# Patient Record
Sex: Female | Born: 1935
Health system: Southern US, Community
[De-identification: ages and names within clinical notes are randomized; demographics above are authoritative.]

## PROBLEM LIST (undated history)

## (undated) DIAGNOSIS — E079 Disorder of thyroid, unspecified: Secondary | ICD-10-CM

## (undated) DIAGNOSIS — M2041 Other hammer toe(s) (acquired), right foot: Secondary | ICD-10-CM

## (undated) DIAGNOSIS — J449 Chronic obstructive pulmonary disease, unspecified: Secondary | ICD-10-CM

## (undated) DIAGNOSIS — J45909 Unspecified asthma, uncomplicated: Secondary | ICD-10-CM

## (undated) DIAGNOSIS — E119 Type 2 diabetes mellitus without complications: Secondary | ICD-10-CM

## (undated) DIAGNOSIS — M1990 Unspecified osteoarthritis, unspecified site: Secondary | ICD-10-CM

## (undated) DIAGNOSIS — L570 Actinic keratosis: Secondary | ICD-10-CM

## (undated) HISTORY — DX: Disorder of thyroid, unspecified: E07.9

## (undated) HISTORY — DX: Unspecified asthma, uncomplicated: J45.909

## (undated) HISTORY — DX: Type 2 diabetes mellitus without complications: E11.9

## (undated) HISTORY — DX: Chronic obstructive pulmonary disease, unspecified: J44.9

## (undated) HISTORY — DX: Unspecified osteoarthritis, unspecified site: M19.90

## (undated) HISTORY — PX: CATARACT EXTRACTION, BILATERAL: SHX1313

## (undated) HISTORY — DX: Actinic keratosis: L57.0

---
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
## 1982-11-14 HISTORY — PX: ABDOMINAL HYSTERECTOMY: SHX81

## 1985-11-14 HISTORY — PX: CHOLECYSTECTOMY: SHX55

## 1988-11-14 HISTORY — PX: NASAL SINUS SURGERY: SHX719

## 2005-11-14 HISTORY — PX: REPLACEMENT TOTAL KNEE: SUR1224

## 2013-10-13 DIAGNOSIS — E119 Type 2 diabetes mellitus without complications: Secondary | ICD-10-CM | POA: Diagnosis not present

## 2013-10-13 DIAGNOSIS — M2550 Pain in unspecified joint: Secondary | ICD-10-CM | POA: Diagnosis not present

## 2013-10-13 DIAGNOSIS — Z9981 Dependence on supplemental oxygen: Secondary | ICD-10-CM | POA: Diagnosis not present

## 2013-10-13 DIAGNOSIS — IMO0002 Reserved for concepts with insufficient information to code with codable children: Secondary | ICD-10-CM | POA: Diagnosis not present

## 2013-10-13 DIAGNOSIS — Z9181 History of falling: Secondary | ICD-10-CM | POA: Diagnosis not present

## 2013-10-13 DIAGNOSIS — J449 Chronic obstructive pulmonary disease, unspecified: Secondary | ICD-10-CM | POA: Diagnosis not present

## 2013-10-15 DIAGNOSIS — E119 Type 2 diabetes mellitus without complications: Secondary | ICD-10-CM | POA: Diagnosis not present

## 2013-10-15 DIAGNOSIS — Z9981 Dependence on supplemental oxygen: Secondary | ICD-10-CM | POA: Diagnosis not present

## 2013-10-15 DIAGNOSIS — M2550 Pain in unspecified joint: Secondary | ICD-10-CM | POA: Diagnosis not present

## 2013-10-15 DIAGNOSIS — J449 Chronic obstructive pulmonary disease, unspecified: Secondary | ICD-10-CM | POA: Diagnosis not present

## 2013-10-15 DIAGNOSIS — IMO0002 Reserved for concepts with insufficient information to code with codable children: Secondary | ICD-10-CM | POA: Diagnosis not present

## 2013-10-15 DIAGNOSIS — Z9181 History of falling: Secondary | ICD-10-CM | POA: Diagnosis not present

## 2013-10-18 DIAGNOSIS — J449 Chronic obstructive pulmonary disease, unspecified: Secondary | ICD-10-CM | POA: Diagnosis not present

## 2013-10-18 DIAGNOSIS — M2550 Pain in unspecified joint: Secondary | ICD-10-CM | POA: Diagnosis not present

## 2013-10-18 DIAGNOSIS — Z9981 Dependence on supplemental oxygen: Secondary | ICD-10-CM | POA: Diagnosis not present

## 2013-10-18 DIAGNOSIS — E119 Type 2 diabetes mellitus without complications: Secondary | ICD-10-CM | POA: Diagnosis not present

## 2013-10-18 DIAGNOSIS — Z9181 History of falling: Secondary | ICD-10-CM | POA: Diagnosis not present

## 2013-10-18 DIAGNOSIS — IMO0002 Reserved for concepts with insufficient information to code with codable children: Secondary | ICD-10-CM | POA: Diagnosis not present

## 2013-10-24 DIAGNOSIS — M2550 Pain in unspecified joint: Secondary | ICD-10-CM | POA: Diagnosis not present

## 2013-10-24 DIAGNOSIS — IMO0002 Reserved for concepts with insufficient information to code with codable children: Secondary | ICD-10-CM | POA: Diagnosis not present

## 2013-10-24 DIAGNOSIS — Z9981 Dependence on supplemental oxygen: Secondary | ICD-10-CM | POA: Diagnosis not present

## 2013-10-24 DIAGNOSIS — Z9181 History of falling: Secondary | ICD-10-CM | POA: Diagnosis not present

## 2013-10-24 DIAGNOSIS — E119 Type 2 diabetes mellitus without complications: Secondary | ICD-10-CM | POA: Diagnosis not present

## 2013-10-24 DIAGNOSIS — J449 Chronic obstructive pulmonary disease, unspecified: Secondary | ICD-10-CM | POA: Diagnosis not present

## 2013-11-01 DIAGNOSIS — E119 Type 2 diabetes mellitus without complications: Secondary | ICD-10-CM | POA: Diagnosis not present

## 2013-11-01 DIAGNOSIS — Z9181 History of falling: Secondary | ICD-10-CM | POA: Diagnosis not present

## 2013-11-01 DIAGNOSIS — IMO0002 Reserved for concepts with insufficient information to code with codable children: Secondary | ICD-10-CM | POA: Diagnosis not present

## 2013-11-01 DIAGNOSIS — M2550 Pain in unspecified joint: Secondary | ICD-10-CM | POA: Diagnosis not present

## 2013-11-01 DIAGNOSIS — Z9981 Dependence on supplemental oxygen: Secondary | ICD-10-CM | POA: Diagnosis not present

## 2013-11-01 DIAGNOSIS — J449 Chronic obstructive pulmonary disease, unspecified: Secondary | ICD-10-CM | POA: Diagnosis not present

## 2013-11-04 DIAGNOSIS — E119 Type 2 diabetes mellitus without complications: Secondary | ICD-10-CM | POA: Diagnosis not present

## 2013-11-04 DIAGNOSIS — M2550 Pain in unspecified joint: Secondary | ICD-10-CM | POA: Diagnosis not present

## 2013-11-04 DIAGNOSIS — Z9181 History of falling: Secondary | ICD-10-CM | POA: Diagnosis not present

## 2013-11-04 DIAGNOSIS — J449 Chronic obstructive pulmonary disease, unspecified: Secondary | ICD-10-CM | POA: Diagnosis not present

## 2013-11-04 DIAGNOSIS — IMO0002 Reserved for concepts with insufficient information to code with codable children: Secondary | ICD-10-CM | POA: Diagnosis not present

## 2013-11-04 DIAGNOSIS — Z9981 Dependence on supplemental oxygen: Secondary | ICD-10-CM | POA: Diagnosis not present

## 2013-11-12 DIAGNOSIS — E119 Type 2 diabetes mellitus without complications: Secondary | ICD-10-CM | POA: Diagnosis not present

## 2013-11-12 DIAGNOSIS — Z9181 History of falling: Secondary | ICD-10-CM | POA: Diagnosis not present

## 2013-11-12 DIAGNOSIS — IMO0002 Reserved for concepts with insufficient information to code with codable children: Secondary | ICD-10-CM | POA: Diagnosis not present

## 2013-11-12 DIAGNOSIS — J449 Chronic obstructive pulmonary disease, unspecified: Secondary | ICD-10-CM | POA: Diagnosis not present

## 2013-11-12 DIAGNOSIS — M2550 Pain in unspecified joint: Secondary | ICD-10-CM | POA: Diagnosis not present

## 2013-11-12 DIAGNOSIS — Z9981 Dependence on supplemental oxygen: Secondary | ICD-10-CM | POA: Diagnosis not present

## 2013-11-18 DIAGNOSIS — M2550 Pain in unspecified joint: Secondary | ICD-10-CM | POA: Diagnosis not present

## 2013-11-18 DIAGNOSIS — Z9981 Dependence on supplemental oxygen: Secondary | ICD-10-CM | POA: Diagnosis not present

## 2013-11-18 DIAGNOSIS — J449 Chronic obstructive pulmonary disease, unspecified: Secondary | ICD-10-CM | POA: Diagnosis not present

## 2013-11-18 DIAGNOSIS — IMO0002 Reserved for concepts with insufficient information to code with codable children: Secondary | ICD-10-CM | POA: Diagnosis not present

## 2013-11-18 DIAGNOSIS — Z9181 History of falling: Secondary | ICD-10-CM | POA: Diagnosis not present

## 2013-11-18 DIAGNOSIS — E119 Type 2 diabetes mellitus without complications: Secondary | ICD-10-CM | POA: Diagnosis not present

## 2013-11-27 DIAGNOSIS — E119 Type 2 diabetes mellitus without complications: Secondary | ICD-10-CM | POA: Diagnosis not present

## 2013-11-27 DIAGNOSIS — Z9981 Dependence on supplemental oxygen: Secondary | ICD-10-CM | POA: Diagnosis not present

## 2013-11-27 DIAGNOSIS — IMO0002 Reserved for concepts with insufficient information to code with codable children: Secondary | ICD-10-CM | POA: Diagnosis not present

## 2013-11-27 DIAGNOSIS — Z9181 History of falling: Secondary | ICD-10-CM | POA: Diagnosis not present

## 2013-11-27 DIAGNOSIS — J449 Chronic obstructive pulmonary disease, unspecified: Secondary | ICD-10-CM | POA: Diagnosis not present

## 2013-11-27 DIAGNOSIS — M2550 Pain in unspecified joint: Secondary | ICD-10-CM | POA: Diagnosis not present

## 2013-12-05 DIAGNOSIS — J449 Chronic obstructive pulmonary disease, unspecified: Secondary | ICD-10-CM | POA: Diagnosis not present

## 2013-12-05 DIAGNOSIS — Z9181 History of falling: Secondary | ICD-10-CM | POA: Diagnosis not present

## 2013-12-05 DIAGNOSIS — M2550 Pain in unspecified joint: Secondary | ICD-10-CM | POA: Diagnosis not present

## 2013-12-05 DIAGNOSIS — Z9981 Dependence on supplemental oxygen: Secondary | ICD-10-CM | POA: Diagnosis not present

## 2013-12-05 DIAGNOSIS — IMO0002 Reserved for concepts with insufficient information to code with codable children: Secondary | ICD-10-CM | POA: Diagnosis not present

## 2013-12-05 DIAGNOSIS — E119 Type 2 diabetes mellitus without complications: Secondary | ICD-10-CM | POA: Diagnosis not present

## 2013-12-09 DIAGNOSIS — J449 Chronic obstructive pulmonary disease, unspecified: Secondary | ICD-10-CM | POA: Diagnosis not present

## 2013-12-09 DIAGNOSIS — E119 Type 2 diabetes mellitus without complications: Secondary | ICD-10-CM | POA: Diagnosis not present

## 2013-12-09 DIAGNOSIS — IMO0002 Reserved for concepts with insufficient information to code with codable children: Secondary | ICD-10-CM | POA: Diagnosis not present

## 2013-12-09 DIAGNOSIS — M2550 Pain in unspecified joint: Secondary | ICD-10-CM | POA: Diagnosis not present

## 2013-12-09 DIAGNOSIS — Z9181 History of falling: Secondary | ICD-10-CM | POA: Diagnosis not present

## 2013-12-09 DIAGNOSIS — Z9981 Dependence on supplemental oxygen: Secondary | ICD-10-CM | POA: Diagnosis not present

## 2013-12-19 DIAGNOSIS — E119 Type 2 diabetes mellitus without complications: Secondary | ICD-10-CM | POA: Diagnosis not present

## 2013-12-19 DIAGNOSIS — I10 Essential (primary) hypertension: Secondary | ICD-10-CM | POA: Diagnosis not present

## 2013-12-19 DIAGNOSIS — E039 Hypothyroidism, unspecified: Secondary | ICD-10-CM | POA: Diagnosis not present

## 2013-12-19 DIAGNOSIS — M810 Age-related osteoporosis without current pathological fracture: Secondary | ICD-10-CM | POA: Diagnosis not present

## 2013-12-24 DIAGNOSIS — E039 Hypothyroidism, unspecified: Secondary | ICD-10-CM | POA: Diagnosis not present

## 2013-12-24 DIAGNOSIS — J449 Chronic obstructive pulmonary disease, unspecified: Secondary | ICD-10-CM | POA: Diagnosis not present

## 2013-12-24 DIAGNOSIS — N390 Urinary tract infection, site not specified: Secondary | ICD-10-CM | POA: Diagnosis not present

## 2013-12-24 DIAGNOSIS — E119 Type 2 diabetes mellitus without complications: Secondary | ICD-10-CM | POA: Diagnosis not present

## 2013-12-24 DIAGNOSIS — I10 Essential (primary) hypertension: Secondary | ICD-10-CM | POA: Diagnosis not present

## 2014-02-13 DIAGNOSIS — N390 Urinary tract infection, site not specified: Secondary | ICD-10-CM | POA: Diagnosis not present

## 2014-03-13 DIAGNOSIS — R062 Wheezing: Secondary | ICD-10-CM | POA: Diagnosis not present

## 2014-03-13 DIAGNOSIS — R0602 Shortness of breath: Secondary | ICD-10-CM | POA: Diagnosis not present

## 2014-03-13 DIAGNOSIS — R0609 Other forms of dyspnea: Secondary | ICD-10-CM | POA: Diagnosis not present

## 2014-03-13 DIAGNOSIS — R0682 Tachypnea, not elsewhere classified: Secondary | ICD-10-CM | POA: Diagnosis not present

## 2014-04-16 DIAGNOSIS — R0609 Other forms of dyspnea: Secondary | ICD-10-CM | POA: Diagnosis not present

## 2014-04-16 DIAGNOSIS — R0602 Shortness of breath: Secondary | ICD-10-CM | POA: Diagnosis not present

## 2014-04-16 DIAGNOSIS — J019 Acute sinusitis, unspecified: Secondary | ICD-10-CM | POA: Diagnosis not present

## 2014-04-16 DIAGNOSIS — J449 Chronic obstructive pulmonary disease, unspecified: Secondary | ICD-10-CM | POA: Diagnosis not present

## 2014-04-16 DIAGNOSIS — R0989 Other specified symptoms and signs involving the circulatory and respiratory systems: Secondary | ICD-10-CM | POA: Diagnosis not present

## 2014-05-12 DIAGNOSIS — R062 Wheezing: Secondary | ICD-10-CM | POA: Diagnosis not present

## 2014-05-12 DIAGNOSIS — R05 Cough: Secondary | ICD-10-CM | POA: Diagnosis not present

## 2014-05-12 DIAGNOSIS — R059 Cough, unspecified: Secondary | ICD-10-CM | POA: Diagnosis not present

## 2014-05-12 DIAGNOSIS — J449 Chronic obstructive pulmonary disease, unspecified: Secondary | ICD-10-CM | POA: Diagnosis not present

## 2014-05-12 DIAGNOSIS — J019 Acute sinusitis, unspecified: Secondary | ICD-10-CM | POA: Diagnosis not present

## 2014-05-19 DIAGNOSIS — R0609 Other forms of dyspnea: Secondary | ICD-10-CM | POA: Diagnosis not present

## 2014-05-19 DIAGNOSIS — R0682 Tachypnea, not elsewhere classified: Secondary | ICD-10-CM | POA: Diagnosis not present

## 2014-05-19 DIAGNOSIS — R062 Wheezing: Secondary | ICD-10-CM | POA: Diagnosis not present

## 2014-05-19 DIAGNOSIS — R0602 Shortness of breath: Secondary | ICD-10-CM | POA: Diagnosis not present

## 2014-05-20 DIAGNOSIS — I10 Essential (primary) hypertension: Secondary | ICD-10-CM | POA: Diagnosis not present

## 2014-05-20 DIAGNOSIS — IMO0001 Reserved for inherently not codable concepts without codable children: Secondary | ICD-10-CM | POA: Diagnosis not present

## 2014-05-20 DIAGNOSIS — E785 Hyperlipidemia, unspecified: Secondary | ICD-10-CM | POA: Diagnosis not present

## 2014-05-21 DIAGNOSIS — Z9119 Patient's noncompliance with other medical treatment and regimen: Secondary | ICD-10-CM | POA: Diagnosis not present

## 2014-05-21 DIAGNOSIS — Z91199 Patient's noncompliance with other medical treatment and regimen due to unspecified reason: Secondary | ICD-10-CM | POA: Diagnosis not present

## 2014-05-21 DIAGNOSIS — E785 Hyperlipidemia, unspecified: Secondary | ICD-10-CM | POA: Diagnosis not present

## 2014-05-21 DIAGNOSIS — IMO0001 Reserved for inherently not codable concepts without codable children: Secondary | ICD-10-CM | POA: Diagnosis not present

## 2014-05-30 DIAGNOSIS — J449 Chronic obstructive pulmonary disease, unspecified: Secondary | ICD-10-CM | POA: Diagnosis not present

## 2014-05-30 DIAGNOSIS — I10 Essential (primary) hypertension: Secondary | ICD-10-CM | POA: Diagnosis not present

## 2014-05-30 DIAGNOSIS — IMO0001 Reserved for inherently not codable concepts without codable children: Secondary | ICD-10-CM | POA: Diagnosis not present

## 2014-06-06 DIAGNOSIS — IMO0001 Reserved for inherently not codable concepts without codable children: Secondary | ICD-10-CM | POA: Diagnosis not present

## 2014-07-28 ENCOUNTER — Ambulatory Visit (INDEPENDENT_AMBULATORY_CARE_PROVIDER_SITE_OTHER): Payer: Medicare Other

## 2014-07-28 ENCOUNTER — Encounter: Payer: Self-pay | Admitting: Podiatry

## 2014-07-28 ENCOUNTER — Ambulatory Visit (INDEPENDENT_AMBULATORY_CARE_PROVIDER_SITE_OTHER): Payer: Medicare Other | Admitting: Podiatry

## 2014-07-28 VITALS — BP 140/81 | HR 70 | Resp 16 | Ht 64.0 in | Wt 190.0 lb

## 2014-07-28 DIAGNOSIS — S92309A Fracture of unspecified metatarsal bone(s), unspecified foot, initial encounter for closed fracture: Secondary | ICD-10-CM

## 2014-07-28 DIAGNOSIS — E119 Type 2 diabetes mellitus without complications: Secondary | ICD-10-CM | POA: Diagnosis not present

## 2014-07-28 NOTE — Progress Notes (Signed)
   Subjective:    Patient ID: Megan Bradshaw, female    DOB: [birth year], 80 y.o.   MRN: [id number]  HPI Comments: Its the left foot. Its painful. Its not hurting as much as it was. In the mornings are really bad. It hurts to walk and stand. The foot has been hurting for 1 week. i take ibuprofen and wear a darco shoe.   Foot Pain      Review of Systems  HENT: Positive for sinus pressure.   Respiratory: Positive for shortness of breath.   Hematological: Bruises/bleeds easily.  All other systems reviewed and are negative.      Objective:   Physical Exam: I have reviewed her past medical history medications allergies surgeries social history and review of systems. Pulses are strongly palpable neurologic sensorium is intact percent monofilament deep tendon reflexes are intact bilateral muscle strength is 5 over 5 dorsiflexors plantar flexors inverters everters all intrinsic musculature is intact. Orthopedic evaluation demonstrates hallux abductovalgus deformities bilateral and hammertoe deformities bilateral. She has pain on palpation fifth metatarsal base of the left foot with overlying erythema and edema and pain. Radiographic evaluation confirms a stress fracture fifth metatarsal base left foot.        Assessment & Plan:  Assessment: Fifth metatarsal fracture nondisplaced non-comminuted appears to be a stress fracture base of the fifth metatarsal.  Plan: Discussed etiology pathology conservative versus surgical therapies. Placed her in a Darco shoe and will followup with her in one month for set of x-rays if necessary.

## 2014-08-04 DIAGNOSIS — J441 Chronic obstructive pulmonary disease with (acute) exacerbation: Secondary | ICD-10-CM | POA: Diagnosis not present

## 2014-08-12 ENCOUNTER — Encounter: Payer: Self-pay | Admitting: Internal Medicine

## 2014-08-12 ENCOUNTER — Ambulatory Visit (INDEPENDENT_AMBULATORY_CARE_PROVIDER_SITE_OTHER)
Admission: RE | Admit: 2014-08-12 | Discharge: 2014-08-12 | Disposition: A | Discharge status: Home / Self Care | Payer: Medicare Other | Source: Ambulatory Visit | Attending: Internal Medicine | Admitting: Internal Medicine

## 2014-08-12 ENCOUNTER — Ambulatory Visit (INDEPENDENT_AMBULATORY_CARE_PROVIDER_SITE_OTHER): Payer: Medicare Other | Admitting: Internal Medicine

## 2014-08-12 VITALS — BP 140/90 | HR 79 | Temp 97.9°F | Ht 64.0 in | Wt 192.0 lb

## 2014-08-12 DIAGNOSIS — G4734 Idiopathic sleep related nonobstructive alveolar hypoventilation: Secondary | ICD-10-CM | POA: Diagnosis not present

## 2014-08-12 DIAGNOSIS — J449 Chronic obstructive pulmonary disease, unspecified: Secondary | ICD-10-CM

## 2014-08-12 DIAGNOSIS — E669 Obesity, unspecified: Secondary | ICD-10-CM

## 2014-08-12 DIAGNOSIS — J9819 Other pulmonary collapse: Secondary | ICD-10-CM | POA: Diagnosis not present

## 2014-08-12 MED ORDER — FLUTICASONE FUROATE-VILANTEROL 100-25 MCG/INH IN AEPB: 1.0000 | INHALATION_SPRAY | Freq: Every morning | RESPIRATORY_TRACT | Status: DC

## 2014-08-12 NOTE — Progress Notes (Signed)
Quick Note:  Spoke with pt and notified of results per Dr. Wert. Pt verbalized understanding and denied any questions.  ______ 

## 2014-08-12 NOTE — Assessment & Plan Note (Signed)
Note significant wt gain since quit smoking and c/o sob bending over one of her main complaints, reviewed.   Already did rehab in [place] otherwise would be good candidate  See instructions for specific recommendations which were reviewed directly with the patient who was given a copy with highlighter outlining the key components.

## 2014-08-12 NOTE — Progress Notes (Signed)
   Subjective:    Patient ID: Megan Bradshaw, female    DOB: 11-21-1929  MRN: [id number]  HPI   35 yowf quit smoking 1989 at wt around 135 with onset of sob around 2000 and dx as copd by pulmonary doctor but no need for any maint rx short prednisone course until 2013 required 5 mg floor and arrived in [facility] 08/12/2014    08/12/2014 1st [place] [facility] office visit/ Graysin Luczynski   Chief Complaint  Patient presents with  . Pulmonary Consult    Self referral. Pt states dxed with COPD approx 15 yrs ago. She c/o DOE with bending over and "walking too much".      doe x sev hundred feet maybe once or twice a day albuterol plus alb neb each in addition to advair Worse since flare of cough rx  augmentin   No obvious day to day or daytime variabilty or assoc chronic cough or cp or chest tightness, subjective wheeze overt sinus or hb symptoms. No unusual exp hx or h/o childhood pna/ asthma or knowledge of premature birth.  Sleeping ok without nocturnal  or early am exacerbation  of respiratory  c/o's or need for noct saba. Also denies any obvious fluctuation of symptoms with weather or environmental changes or other aggravating or alleviating factors except as outlined above   Current Medications, Allergies, Complete Past Medical History, Past Surgical History, Family History, and Social History were reviewed in [employer] record.   .        Review of Systems  Constitutional: Negative for fever, chills and unexpected weight change.  HENT: Positive for congestion. Negative for dental problem, ear pain, nosebleeds, postnasal drip, rhinorrhea, sinus pressure, sneezing, sore throat, trouble swallowing and voice change.   Eyes: Negative for visual disturbance.  Respiratory: Positive for shortness of breath. Negative for cough and choking.   Cardiovascular: Negative for chest pain and leg swelling.  Gastrointestinal: Negative for vomiting, abdominal pain and diarrhea.    Genitourinary: Negative for difficulty urinating.  Musculoskeletal: Positive for arthralgias.  Skin: Negative for rash.  Neurological: Negative for tremors, syncope and headaches.  Hematological: Does not bruise/bleed easily.       Objective:   Physical Exam  amb obese wf nad  Wt Readings from Last 3 Encounters:  07/28/14 190 lb (86.183 kg)        HEENT mild turbinate edema. Top set dentures. Oropharynx no thrush or excess pnd or cobblestoning.  No JVD or cervical adenopathy. Mild accessory muscle hypertrophy. Trachea midline, nl thryroid. Chest was hyperinflated by percussion with diminished breath sounds and moderate increased exp time without wheeze. Hoover sign positive at mid inspiration. Regular rate and rhythm without murmur gallop or rub or increase P2 or edema.  Abd: no hsm, nl excursion. Ext warm without cyanosis or clubbing.       CXR  08/12/2014 :  1. Bibasilar and bilateral upper lobe subsegmental atelectasis and/or scarring. 2. No acute cardiopulmonary disease.      Assessment & Plan:

## 2014-08-12 NOTE — Assessment & Plan Note (Signed)
-   08/12/2014  Walked RA @ mod to fast pace x 3 laps @ 185 ft each stopped due to  Sob, no desat  - ONO requested but no need for 02 at rest or with exertion

## 2014-08-12 NOTE — Patient Instructions (Signed)
Plan A = Automatic = Breo 2puffs off of one click each am   Plan B = backup = Only use your albuterol as a rescue medication to be used if you can't catch your breath by resting or doing a relaxed purse lip breathing pattern.  - The less you use it, the better it will work when you need it. - Ok to use up to 2 puffs  every 4 hours if you must but call for immediate appointment if use goes up over your usual need - Don't leave home without it !!  (think of it like the spare tire for your car)   Plan C = Nebulized albuterol up to every 4 hours  Plan D = doctor call if A thru C not working  Plan E = ER, go there if all else fails  Prednisone should taper off if you can   Please remember to go to the x-ray department downstairs for your tests - we will call you with the results when they are available.

## 2014-08-12 NOTE — Assessment & Plan Note (Addendum)
DDX of  difficult airways management all start with A and  include Adherence, Ace Inhibitors, Acid Reflux, Active Sinus Disease, Alpha 1 Antitripsin deficiency, Anxiety masquerading as Airways dz,  ABPA,  allergy(esp in young), Aspiration (esp in elderly), Adverse effects of DPI,  Active smokers, plus two Bs  = Bronchiectasis and Beta blocker use..and one C= CHF  Adherence is always the initial "prime suspect" and is a multilayered concern that requires a "trust but verify" approach in every patient - starting with knowing how to use medications, especially inhalers, correctly, keeping up with refills and understanding the fundamental difference between maintenance and prns vs those medications only taken for a very short course and then stopped and not refilled.  The proper method of use, as well as anticipated side effects, of a metered-dose inhaler are discussed and demonstrated to the patient. Improved effectiveness after extensive coaching during this visit to a level of approximately  25% from a baseline of < 25%  ? Allergic component (thus steroid dep) > doubt, try to taper off systemic steroids    So try Breo each am as easiest to use pending f/u pfts

## 2014-08-13 ENCOUNTER — Encounter: Payer: Self-pay | Admitting: Internal Medicine

## 2014-08-18 DIAGNOSIS — R0902 Hypoxemia: Secondary | ICD-10-CM | POA: Diagnosis not present

## 2014-08-20 ENCOUNTER — Encounter: Payer: Self-pay | Admitting: Internal Medicine

## 2014-08-20 ENCOUNTER — Telehealth: Payer: Self-pay | Admitting: Internal Medicine

## 2014-08-20 NOTE — Telephone Encounter (Signed)
Per MW- ONO on RA was normal, no need for noct o2 LMTCB

## 2014-08-20 NOTE — Telephone Encounter (Signed)
Spoke with pt and notified of results per Dr. Wert. Pt verbalized understanding and denied any questions. 

## 2014-08-25 ENCOUNTER — Ambulatory Visit: Payer: Medicare Other | Admitting: Podiatry

## 2014-09-03 ENCOUNTER — Ambulatory Visit: Payer: Medicare Other | Admitting: Podiatry

## 2014-09-22 ENCOUNTER — Encounter: Payer: Self-pay | Admitting: Internal Medicine

## 2014-09-22 ENCOUNTER — Ambulatory Visit (INDEPENDENT_AMBULATORY_CARE_PROVIDER_SITE_OTHER): Payer: Medicare Other | Admitting: Internal Medicine

## 2014-09-22 VITALS — BP 138/78 | HR 80 | Temp 97.8°F | Ht 62.33 in | Wt 190.2 lb

## 2014-09-22 DIAGNOSIS — N390 Urinary tract infection, site not specified: Secondary | ICD-10-CM

## 2014-09-22 DIAGNOSIS — E039 Hypothyroidism, unspecified: Secondary | ICD-10-CM | POA: Diagnosis not present

## 2014-09-22 DIAGNOSIS — Z13220 Encounter for screening for lipoid disorders: Secondary | ICD-10-CM

## 2014-09-22 DIAGNOSIS — T380X5A Adverse effect of glucocorticoids and synthetic analogues, initial encounter: Secondary | ICD-10-CM | POA: Diagnosis not present

## 2014-09-22 DIAGNOSIS — E669 Obesity, unspecified: Secondary | ICD-10-CM | POA: Diagnosis not present

## 2014-09-22 DIAGNOSIS — J449 Chronic obstructive pulmonary disease, unspecified: Secondary | ICD-10-CM

## 2014-09-22 DIAGNOSIS — R350 Frequency of micturition: Secondary | ICD-10-CM | POA: Diagnosis not present

## 2014-09-22 DIAGNOSIS — E099 Drug or chemical induced diabetes mellitus without complications: Secondary | ICD-10-CM

## 2014-09-22 LAB — COMPREHENSIVE METABOLIC PANEL
ALT: 18 U/L (ref 0–35)
AST: 25 U/L (ref 0–37)
Albumin: 3.5 g/dL (ref 3.5–5.2)
Alkaline Phosphatase: 65 U/L (ref 39–117)
BUN: 25 mg/dL — ABNORMAL HIGH (ref 6–23)
CO2: 23 meq/L (ref 19–32)
Calcium: 9.4 mg/dL (ref 8.4–10.5)
Chloride: 103 mEq/L (ref 96–112)
Creatinine, Ser: 0.8 mg/dL (ref 0.4–1.2)
GFR: 76.24 mL/min (ref >60.00)
GLUCOSE: 124 mg/dL — AB (ref 70–99)
POTASSIUM: 4.3 meq/L (ref 3.5–5.1)
SODIUM: 140 meq/L (ref 135–145)
TOTAL PROTEIN: 7.5 g/dL (ref 6.0–8.3)
Total Bilirubin: 0.4 mg/dL (ref 0.2–1.2)

## 2014-09-22 LAB — CBC
HCT: 43.3 % (ref 36.0–46.0)
Hemoglobin: 14.1 g/dL (ref 12.0–15.0)
MCHC: 32.5 g/dL (ref 30.0–36.0)
MCV: 91 fl (ref 78.0–100.0)
Platelets: 310 10*3/uL (ref 150.0–400.0)
RBC: 4.76 Mil/uL (ref 3.87–5.11)
RDW: 13.8 % (ref 11.5–15.5)
WBC: 8.9 10*3/uL (ref 4.0–10.5)

## 2014-09-22 LAB — LIPID PANEL
Cholesterol: 162 mg/dL (ref 0–200)
HDL: 42.7 mg/dL (ref >39.00)
LDL Cholesterol: 95 mg/dL (ref 0–99)
NonHDL: 119.3
Total CHOL/HDL Ratio: 4
Triglycerides: 124 mg/dL (ref 0.0–149.0)
VLDL: 24.8 mg/dL (ref 0.0–40.0)

## 2014-09-22 LAB — POCT URINALYSIS DIPSTICK
Blood, UA: NEGATIVE
GLUCOSE UA: NEGATIVE
KETONES UA: NEGATIVE
Nitrite, UA: POSITIVE
Spec Grav, UA: >=1.03
UROBILINOGEN UA: NEGATIVE
pH, UA: 5.5

## 2014-09-22 LAB — TSH: TSH: 2.32 u[IU]/mL (ref 0.35–4.50)

## 2014-09-22 LAB — HEMOGLOBIN A1C: Hgb A1c MFr Bld: 8.4 % — ABNORMAL HIGH (ref 4.6–6.5)

## 2014-09-22 MED ORDER — CIPROFLOXACIN HCL 250 MG PO TABS: 250.0000 mg | ORAL_TABLET | Freq: Two times a day (BID) | ORAL | Status: DC

## 2014-09-22 NOTE — Assessment & Plan Note (Signed)
On prednisone and Breo Breathing is okay today Continue to follow with Dr. Melvyn Novas

## 2014-09-22 NOTE — Progress Notes (Signed)
Pre visit review using our clinic review tool, if applicable. No additional management support is needed unless otherwise documented below in the visit note. 

## 2014-09-22 NOTE — Patient Instructions (Signed)
Diabetes and Standards of Medical Care Diabetes is complicated. You may find that your diabetes team includes a dietitian, nurse, diabetes educator, eye doctor, and more. To help everyone know what is going on and to help you get the care you deserve, the following schedule of care was developed to help keep you on track. Below are the tests, exams, vaccines, medicines, education, and plans you will need. HbA1c test This test shows how well you have controlled your glucose over the past 2-3 months. It is used to see if your diabetes management plan needs to be adjusted.   It is performed at least 2 times a year if you are meeting treatment goals.  It is performed 4 times a year if therapy has changed or if you are not meeting treatment goals. Blood pressure test  This test is performed at every routine medical visit. The goal is less than 140/90 mm Hg for most people, but 130/80 mm Hg in some cases. Ask your health care provider about your goal. Dental exam  Follow up with the dentist regularly. Eye exam  If you are diagnosed with type 1 diabetes as a child, get an exam upon reaching the age of 37 years or older and have had diabetes for 3-5 years. Yearly eye exams are recommended after that initial eye exam.  If you are diagnosed with type 1 diabetes as an adult, get an exam within 5 years of diagnosis and then yearly.  If you are diagnosed with type 2 diabetes, get an exam as soon as possible after the diagnosis and then yearly. Foot care exam  Visual foot exams are performed at every routine medical visit. The exams check for cuts, injuries, or other problems with the feet.  A comprehensive foot exam should be done yearly. This includes visual inspection as well as assessing foot pulses and testing for loss of sensation.  Check your feet nightly for cuts, injuries, or other problems with your feet. Tell your health care provider if anything is not healing. Kidney function test (urine  microalbumin)  This test is performed once a year.  Type 1 diabetes: The first test is performed 5 years after diagnosis.  Type 2 diabetes: The first test is performed at the time of diagnosis.  A serum creatinine and estimated glomerular filtration rate (eGFR) test is done once a year to assess the level of chronic kidney disease (CKD), if present. Lipid profile (cholesterol, HDL, LDL, triglycerides)  Performed every 5 years for most people.  The goal for LDL is less than 100 mg/dL. If you are at high risk, the goal is less than 70 mg/dL.  The goal for HDL is 40 mg/dL-50 mg/dL for men and 50 mg/dL-60 mg/dL for women. An HDL cholesterol of 60 mg/dL or higher gives some protection against heart disease.  The goal for triglycerides is less than 150 mg/dL. Influenza vaccine, pneumococcal vaccine, and hepatitis B vaccine  The influenza vaccine is recommended yearly.  It is recommended that people with diabetes who are over 24 years old get the pneumonia vaccine. In some cases, two separate shots may be given. Ask your health care provider if your pneumonia vaccination is up to date.  The hepatitis B vaccine is also recommended for adults with diabetes. Diabetes self-management education  Education is recommended at diagnosis and ongoing as needed. Treatment plan  Your treatment plan is reviewed at every medical visit. Document Released: 08/28/2009 Document Revised: 03/17/2014 Document Reviewed: 04/02/2013 [facility] Patient Information 2015 [place],  [employer]. This information is not intended to replace advice given to you by your health care provider. Make sure you discuss any questions you have with your health care provider.

## 2014-09-22 NOTE — Assessment & Plan Note (Signed)
Exercise impaired by COPD She does reports that she walks 4 x week on the treadmill

## 2014-09-22 NOTE — Progress Notes (Signed)
HPI  Pt presents to the clinic today to establish care. She recently moved from [place] to be closer to her daughter.  Flu: 2015 Tetanus: unsure Pneumovax: unsure Zostovax: never Pap Smear: unsure Mammogram: 2014 Bone Density: unsure Colon Screening: 2013 Vision Screening: unsure Dentist: as needed  DM 2: She reports this is steroid induced from being on prednisone for so long for her COPD. She is on glipizide and metformin. She does not have an eye exam yearly. She does take her flu shot, but has already had this year. She has had a pneumonia shot but does not remember the year.  COPD: Former smoker. On prednisone and Breo. Albuterol prn. Follows with Dr. Melvyn Novas.   Hypothyroidism: Has not noticed any symptoms of hypothyroidism. Satisfied on current dose of synthroid. Has not had her levels checked in about 4 months.  Past Medical History  Diagnosis Date  . Diabetes   . Asthma   . Thyroid disease   . COPD (chronic obstructive pulmonary disease)   . Arthritis     Current Outpatient Prescriptions  Medication Sig Dispense Refill  . albuterol (VENTOLIN HFA) 108 (90 BASE) MCG/ACT inhaler Inhale 1-2 puffs into the lungs every 6 (six) hours as needed for wheezing or shortness of breath.    . cetirizine (ZYRTEC) 10 MG tablet Take 10 mg by mouth daily.    . Cholecalciferol (VITAMIN D PO) Take 1 tablet by mouth daily.    . Fluticasone Furoate-Vilanterol (BREO ELLIPTA) 100-25 MCG/INH AEPB Inhale 1 puff into the lungs every morning.    Marland Kitchen glipiZIDE (GLUCOTROL XL) 2.5 MG 24 hr tablet Take 2.5 mg by mouth daily with breakfast.    . levothyroxine (SYNTHROID, LEVOTHROID) 75 MCG tablet Take 75 mcg by mouth daily before breakfast.    . metFORMIN (GLUCOPHAGE) 500 MG tablet Take 500 mg by mouth 2 (two) times daily with a meal.    . Multiple Vitamin (MULTIVITAMIN) capsule Take 1 capsule by mouth daily.    . predniSONE (DELTASONE) 5 MG tablet Take 1.25 mg by mouth daily with breakfast.      No current  facility-administered medications for this visit.    No Known Allergies  Family History  Problem Relation Age of Onset  . Arthritis Father   . Diabetes Sister   . Diabetes Daughter   . Cancer Neg Hx   . Heart disease Neg Hx   . Stroke Neg Hx     History   Social History  . Marital Status: Unknown    Spouse Name: N/A    Number of Children: N/A  . Years of Education: N/A   Occupational History  . Retired    Social History Main Topics  . Smoking status: Former Smoker -- 1.50 packs/day for 30 years    Types: Cigarettes    Quit date: 11/15/1987  . Smokeless tobacco: Never Used  . Alcohol Use: No  . Drug Use: No  . Sexual Activity: Not on file   Other Topics Concern  . Not on file   Social History Narrative    ROS:  Constitutional: Denies fever, malaise, fatigue, headache or abrupt weight changes.  HEENT: Pt reports runny nose. Denies eye pain, eye redness, ear pain, ringing in the ears, wax buildup, nasal congestion, bloody nose, or sore throat. Respiratory: Denies difficulty breathing, shortness of breath, cough or sputum production.   Cardiovascular: Denies chest pain, chest tightness, palpitations or swelling in the hands or feet.  GU: Pt reports urgency and frequency. Denies pain  with urination, blood in urine, odor or discharge. Skin: Denies redness, rashes, lesions or ulcercations.  Neurological: Denies dizziness, difficulty with memory, difficulty with speech or problems with balance and coordination.   No other specific complaints in a complete review of systems (except as listed in HPI above).  PE:  BP 138/78 mmHg  Pulse 80  Temp(Src) 97.8 F (36.6 C) (Oral)  Ht 5' 2.33" (1.583 m)  Wt 190 lb 4 oz (86.297 kg)  BMI 34.44 kg/m2  SpO2 98% Wt Readings from Last 3 Encounters:  09/22/14 190 lb 4 oz (86.297 kg)  08/13/14 192 lb (87.091 kg)  07/28/14 190 lb (86.183 kg)    General: Appears her stated age, obese but well developed, well nourished in  NAD. Cardiovascular: Normal rate and rhythm. S1,S2 noted.  No murmur, rubs or gallops noted.  Pulmonary/Chest: Normal effort and positive vesicular breath sounds. No respiratory distress. No wheezes, rales or ronchi noted.  Abdomen: Soft and tender over the bladder. Normal bowel sounds, no bruits noted. No distention or masses noted. Liver, spleen and kidneys non palpable. No CVA tenderness. Neurological: Alert and oriented. Cranial nerves II-XII grossly intact.   BMET    Component Value Date/Time   NA 140 09/22/2014 1034   K 4.3 09/22/2014 1034   CL 103 09/22/2014 1034   CO2 23 09/22/2014 1034   GLUCOSE 124* 09/22/2014 1034   BUN 25* 09/22/2014 1034   CREATININE 0.8 09/22/2014 1034   CALCIUM 9.4 09/22/2014 1034    Lipid Panel     Component Value Date/Time   CHOL 162 09/22/2014 1034   TRIG 124.0 09/22/2014 1034   HDL 42.70 09/22/2014 1034   CHOLHDL 4 09/22/2014 1034   VLDL 24.8 09/22/2014 1034   LDLCALC 95 09/22/2014 1034    CBC    Component Value Date/Time   WBC 8.9 09/22/2014 1034   RBC 4.76 09/22/2014 1034   HGB 14.1 09/22/2014 1034   HCT 43.3 09/22/2014 1034   PLT 310.0 09/22/2014 1034   MCV 91.0 09/22/2014 1034   MCHC 32.5 09/22/2014 1034   RDW 13.8 09/22/2014 1034    Hgb A1C Lab Results  Component Value Date   HGBA1C 8.4* 09/22/2014     Assessment and Plan:  Urgency and Frequency secondary to UTI:  Urinalysis: 1+ leuks, pos nitrites, trace blood Will send urine culture Will send RX for cipro 250 mg BID x 5 days Push fluids  RTC in 6 months or sooner if needed

## 2014-09-22 NOTE — Assessment & Plan Note (Signed)
Will check A1C today Will get records from previous provider to check immunization status/health maintenance status Foot exam today Continue Metformin and Glipizide daily Encouraged her to consume a low carb diet

## 2014-09-22 NOTE — Assessment & Plan Note (Signed)
Will repeat TSH and free T4 today Will adjust dose of synthroid if needed

## 2014-09-23 ENCOUNTER — Ambulatory Visit (INDEPENDENT_AMBULATORY_CARE_PROVIDER_SITE_OTHER): Payer: Medicare Other | Admitting: Internal Medicine

## 2014-09-23 ENCOUNTER — Encounter: Payer: Self-pay | Admitting: Internal Medicine

## 2014-09-23 DIAGNOSIS — J449 Chronic obstructive pulmonary disease, unspecified: Secondary | ICD-10-CM | POA: Diagnosis not present

## 2014-09-23 NOTE — Progress Notes (Signed)
Subjective:    Patient ID: Megan Bradshaw, female    DOB: December 12, 1929  MRN: [id number]     Brief patient profile:  84 yowf quit smoking 1989 at wt around 135 with onset of sob around 2000 and dx as copd by pulmonary doctor but no need for any maint rx short prednisone course until 2013 required 5 mg floor and arrived in [facility] 08/12/2014    History of Present Illness  08/12/2014 1st [place] [facility] office visit/ Lahna Nath   Chief Complaint  Patient presents with  . Pulmonary Consult    Self referral. Pt states dxed with COPD approx 15 yrs ago. She c/o DOE with bending over and "walking too much".      doe x sev hundred feet maybe once or twice a day albuterol plus alb neb each in addition to advair Worse since flare of cough rx  augmentin  rec Plan A = Automatic = Breo 2puffs off of one click each am  Plan B = backup = Only use your albuterol as a rescue medication to be used if you can't catch your breath by resting or doing a relaxed purse lip breathing pattern.  - The less you use it, the better it will work when you need it. - Ok to use up to 2 puffs  every 4 hours if you must but call for immediate appointment if use goes up over your usual need - Don't leave home without it !!  (think of it like the spare tire for your car)  Plan C = Nebulized albuterol up to every 4 hours Plan D = doctor call if A thru C not working Plan E = ER, go there if all else fails Prednisone should taper off if you can      09/23/2014 f/u ov/Tniyah Nakagawa re: copd ? Steroid dep at one quarter pred 5  Daily (1.25 mg daily ) Chief Complaint  Patient presents with  . Follow-up    Pt states that her breathing has improved since the last visit. No new co's today.  Breo costing 194 dollars a month  Doing 30 min on" treadmill with arms set on #2"  s stopping  No need for saba rescue   No obvious day to day or daytime variabilty or assoc chronic cough or cp or chest tightness, subjective wheeze overt sinus or  hb symptoms. No unusual exp hx or h/o childhood pna/ asthma or knowledge of premature birth.  Sleeping ok without nocturnal  or early am exacerbation  of respiratory  c/o's or need for noct saba. Also denies any obvious fluctuation of symptoms with weather or environmental changes or other aggravating or alleviating factors except as outlined above   Current Medications, Allergies, Complete Past Medical History, Past Surgical History, Family History, and Social History were reviewed in [employer] record.  ROS  The following are not active complaints unless bolded sore throat, dysphagia, dental problems, itching, sneezing,  nasal congestion or excess/ purulent secretions, ear ache,   fever, chills, sweats, unintended wt loss, pleuritic or exertional cp, hemoptysis,  orthopnea pnd or leg swelling, presyncope, palpitations, heartburn, abdominal pain, anorexia, nausea, vomiting, diarrhea  or change in bowel or urinary habits, change in stools or urine, dysuria,hematuria,  rash, arthralgias, visual complaints, headache, numbness weakness or ataxia or problems with walking or coordination,  change in mood/affect or memory.                     Objective:  Physical Exam  amb mod obese wf nad  Wt Readings from Last 3 Encounters:  09/23/14 195 lb (88.451 kg)  09/22/14 190 lb 4 oz (86.297 kg)  08/13/14 192 lb (87.091 kg)    Vital signs reviewed    HEENT mild turbinate edema. Top set dentures. Oropharynx no thrush or excess pnd or cobblestoning.  No JVD or cervical adenopathy. Mild accessory muscle hypertrophy. Trachea midline, nl thryroid. Chest was hyperinflated by percussion with diminished breath sounds and moderate increased exp time without wheeze. Hoover sign positive at mid inspiration. Regular rate and rhythm without murmur gallop or rub or increase P2 or edema.  Abd: no hsm, nl excursion. Ext warm without cyanosis or clubbing.       CXR  08/12/2014 :  1.  Bibasilar and bilateral upper lobe subsegmental atelectasis and/or scarring. 2. No acute cardiopulmonary disease.      Assessment & Plan:

## 2014-09-23 NOTE — Patient Instructions (Addendum)
Continue your Breo for now  We need for you to supply me in person with a copy of your drug formulary from your insurance re your copd to pick alternatives to [facility]   Continue the exercise as much you can for 30 min daily if possible  Please schedule a follow up office visit in 4 weeks, sooner if needed with pfts  Late add:  Take 1.25 qod x 2 weeks and stop

## 2014-09-24 ENCOUNTER — Telehealth: Payer: Self-pay | Admitting: *Deleted

## 2014-09-24 NOTE — Telephone Encounter (Signed)
LMTCB for the pt 

## 2014-09-24 NOTE — Telephone Encounter (Signed)
[email or web address].Colton Tassin, CMA  

## 2014-09-24 NOTE — Telephone Encounter (Signed)
ATC, Mailbox is full, [facility]

## 2014-09-24 NOTE — Telephone Encounter (Signed)
Spoke with patient-she states she understands what MW wants her to do however she cant just stop Prednisone-other MD's have told her not to. Will forward to MW to address. Thanks.

## 2014-09-24 NOTE — Telephone Encounter (Signed)
She indicated she was using a quarter of a  5 mg which means her body must be making it's own but in any case that's enough to worry about either way and fine with me if she stays on it or even tries qod dosing but from a pulmonary perspective does not need daily steroids

## 2014-09-24 NOTE — Assessment & Plan Note (Signed)
-   08/12/2014 p extensive coaching HFA effectiveness =    25% - Trial of BREO  08/12/2014 > marked improvement 09/23/14 >   I had an extended discussion with the patient today lasting 15 to 20 minutes of a 25 minute visit on the following issues:   1)  She has had such a great response ok to taper pred off  2) needs to understand insurance formularies/ alternatives as we don't have enough samples of breo and she can't afford the copay  3) no justification at all to continue 02 either at hs or noct   F/u with pfts needed next

## 2014-09-24 NOTE — Telephone Encounter (Signed)
Pt returning call.Megan Bradshaw ° °

## 2014-09-24 NOTE — Telephone Encounter (Signed)
-----   Message from Tanda Rockers, MD sent at 09/24/2014  6:28 AM EST ----- Late add: Pred Take 1.25 qod x 2 weeks and stop

## 2014-09-25 LAB — URINE CULTURE

## 2014-09-25 NOTE — Telephone Encounter (Signed)
Spoke with pt and advised of Dr Gustavus Bryant recommendations.  Pt verbalized understanding.  Nothing further needed.

## 2014-09-26 ENCOUNTER — Other Ambulatory Visit: Payer: Self-pay | Admitting: Internal Medicine

## 2014-09-26 MED ORDER — CIPROFLOXACIN HCL 500 MG PO TABS: 500.0000 mg | ORAL_TABLET | Freq: Two times a day (BID) | ORAL | Status: DC

## 2014-09-26 MED ORDER — GLIPIZIDE ER 5 MG PO TB24: 5.0000 mg | ORAL_TABLET | Freq: Every day | ORAL | Status: DC

## 2014-09-26 NOTE — Addendum Note (Signed)
Addended by: Lurlean Nanny on: 09/26/2014 11:37 AM   Modules accepted: Orders, Medications

## 2014-10-02 ENCOUNTER — Ambulatory Visit (INDEPENDENT_AMBULATORY_CARE_PROVIDER_SITE_OTHER): Payer: Medicare Other | Admitting: Internal Medicine

## 2014-10-02 ENCOUNTER — Encounter: Payer: Self-pay | Admitting: Internal Medicine

## 2014-10-02 ENCOUNTER — Ambulatory Visit: Payer: Medicare Other | Admitting: Internal Medicine

## 2014-10-02 VITALS — BP 126/82 | HR 69 | Temp 98.0°F

## 2014-10-02 DIAGNOSIS — J0100 Acute maxillary sinusitis, unspecified: Secondary | ICD-10-CM

## 2014-10-02 DIAGNOSIS — R350 Frequency of micturition: Secondary | ICD-10-CM | POA: Diagnosis not present

## 2014-10-02 DIAGNOSIS — N390 Urinary tract infection, site not specified: Secondary | ICD-10-CM

## 2014-10-02 LAB — POCT URINALYSIS DIPSTICK
BILIRUBIN UA: NEGATIVE
Blood, UA: NEGATIVE
Glucose, UA: NEGATIVE
Leukocytes, UA: NEGATIVE
Nitrite, UA: NEGATIVE
Spec Grav, UA: >=1.03
UROBILINOGEN UA: NEGATIVE
pH, UA: 5

## 2014-10-02 MED ORDER — AMOXICILLIN-POT CLAVULANATE 875-125 MG PO TABS: 1.0000 | ORAL_TABLET | Freq: Two times a day (BID) | ORAL | Status: DC

## 2014-10-02 NOTE — Progress Notes (Signed)
HPI  Pt presents to the clinic today with c/o congestion and a non-productive cough for 4 days. She is having to use her nebulizer for frequently. She reports nasal congestion with green mcuous. She states she has had no fever, but chills and malaise. She reports associated facial pressure/pain and headache. She has not tried anything OTC. She has not had sick contacts that she is aware of. She does have COPD and asthma. She continues to smoke.  She is concerned her previous UTI has not went away. She endorse urgency and frequency intermittently.  She has not had fever, chills or body aches. She finished her course of antibiotics that was prescribed.   Past Medical History  Diagnosis Date  . Diabetes   . Asthma   . Thyroid disease   . COPD (chronic obstructive pulmonary disease)   . Arthritis     Current Outpatient Prescriptions  Medication Sig Dispense Refill  . albuterol (VENTOLIN HFA) 108 (90 BASE) MCG/ACT inhaler Inhale 1-2 puffs into the lungs every 6 (six) hours as needed for wheezing or shortness of breath.    . cetirizine (ZYRTEC) 10 MG tablet Take 10 mg by mouth daily.    . Cholecalciferol (VITAMIN D PO) Take 1 tablet by mouth daily.    . Fluticasone Furoate-Vilanterol (BREO ELLIPTA) 100-25 MCG/INH AEPB Inhale 1 puff into the lungs every morning.    Marland Kitchen glipiZIDE (GLUCOTROL XL) 5 MG 24 hr tablet Take 1 tablet (5 mg total) by mouth daily with breakfast. 30 tablet 2  . levothyroxine (SYNTHROID, LEVOTHROID) 75 MCG tablet Take 75 mcg by mouth daily before breakfast.    . metFORMIN (GLUCOPHAGE) 500 MG tablet Take 500 mg by mouth 2 (two) times daily with a meal.    . Multiple Vitamin (MULTIVITAMIN) capsule Take 1 capsule by mouth daily.    . predniSONE (DELTASONE) 5 MG tablet Take 1.25 mg by mouth daily with breakfast.      No current facility-administered medications for this visit.    No Known Allergies  Family History  Problem Relation Age of Onset  . Arthritis Father   .  Diabetes Sister   . Diabetes Daughter   . Cancer Neg Hx   . Heart disease Neg Hx   . Stroke Neg Hx     History   Social History  . Marital Status: Unknown    Spouse Name: N/A    Number of Children: N/A  . Years of Education: N/A   Occupational History  . Retired    Social History Main Topics  . Smoking status: Former Smoker -- 1.50 packs/day for 30 years    Types: Cigarettes    Quit date: 11/15/1987  . Smokeless tobacco: Never Used  . Alcohol Use: No  . Drug Use: No  . Sexual Activity: Not on file   Other Topics Concern  . Not on file   Social History Narrative    ROS:  Constitutional: Pt reports chills, headache, and malaise.  HEENT: Pt reports nasal congestion with discharge.  Denies eye pain, eye redness, ear pain, ringing in the ears, wax buildup, bloody nose, or sore throat. Respiratory: Pt reports cough and shortness of breath.   GU: Endorses urgency and frequency at times. Denies  pain with urination, blood in urine, odor or discharge.   No other specific complaints in a complete review of systems (except as listed in HPI above).  PE:  BP 126/82 mmHg  Pulse 69  Temp(Src) 98 F (36.7 C) (Oral)  Wt   SpO2 98% Wt Readings from Last 3 Encounters:  09/23/14 195 lb (88.451 kg)  09/22/14 190 lb 4 oz (86.297 kg)  08/13/14 192 lb (87.091 kg)    General: Appears her stated age, obese but well developed, well nourished in NAD. HEENT: Head: normal shape and size; Mild maxillary and frontal sinus tenderness noted. Ears: Tm's gray and intact, normal light reflex; Nose: mucosa pink and moist, septum midline; Throat/Mouth: Teeth present, mucosa pink and moist, no lesions or ulcerations noted.  Cardiovascular: Normal rate and rhythm. S1,S2 noted.  No murmur, rubs or gallops noted.  Pulmonary/Chest: Normal effort and positive vesicular breath sounds. No respiratory distress. No wheezes, rales or ronchi noted.  Abdomen: Soft, nontender, active bowel sounds. No CVA  tenderness. No distention or mass noted.  BMET    Component Value Date/Time   NA 140 09/22/2014 1034   K 4.3 09/22/2014 1034   CL 103 09/22/2014 1034   CO2 23 09/22/2014 1034   GLUCOSE 124* 09/22/2014 1034   BUN 25* 09/22/2014 1034   CREATININE 0.8 09/22/2014 1034   CALCIUM 9.4 09/22/2014 1034    Lipid Panel     Component Value Date/Time   CHOL 162 09/22/2014 1034   TRIG 124.0 09/22/2014 1034   HDL 42.70 09/22/2014 1034   CHOLHDL 4 09/22/2014 1034   VLDL 24.8 09/22/2014 1034   LDLCALC 95 09/22/2014 1034    CBC    Component Value Date/Time   WBC 8.9 09/22/2014 1034   RBC 4.76 09/22/2014 1034   HGB 14.1 09/22/2014 1034   HCT 43.3 09/22/2014 1034   PLT 310.0 09/22/2014 1034   MCV 91.0 09/22/2014 1034   MCHC 32.5 09/22/2014 1034   RDW 13.8 09/22/2014 1034    Hgb A1C Lab Results  Component Value Date   HGBA1C 8.4* 09/22/2014     Assessment and Plan:   Sinusitis:  This is likely viral Pt insists on antibiotic as weekend/thanksgiving is coming Will start Augmentin for 10 days Follow up for worsening symptoms  UTI, resolved:  Urinalysis: normal No indication for repeat antibiotic at this time

## 2014-10-02 NOTE — Patient Instructions (Signed)

## 2014-10-02 NOTE — Progress Notes (Signed)
Pre visit review using our clinic review tool, if applicable. No additional management support is needed unless otherwise documented below in the visit note. 

## 2014-10-02 NOTE — Addendum Note (Signed)
Addended by: Lurlean Nanny on: 10/02/2014 03:56 PM   Modules accepted: Orders

## 2014-10-17 ENCOUNTER — Telehealth: Payer: Self-pay | Admitting: Internal Medicine

## 2014-10-17 NOTE — Telephone Encounter (Signed)
Called pt--unable to leave vmail.  Will call back

## 2014-10-20 MED ORDER — FLUTICASONE FUROATE-VILANTEROL 100-25 MCG/INH IN AEPB: 1.0000 | INHALATION_SPRAY | Freq: Every morning | RESPIRATORY_TRACT | Status: DC

## 2014-10-20 NOTE — Telephone Encounter (Signed)
I spoke with the pt and she wants a 90 day rx sent to [facility] in chart. Rx sent. Pt is aware. [place] Bing, CMA

## 2014-10-22 ENCOUNTER — Encounter: Payer: Self-pay | Admitting: Internal Medicine

## 2014-10-22 ENCOUNTER — Other Ambulatory Visit: Payer: Self-pay | Admitting: *Deleted

## 2014-10-22 ENCOUNTER — Ambulatory Visit (INDEPENDENT_AMBULATORY_CARE_PROVIDER_SITE_OTHER): Payer: Medicare Other | Admitting: Internal Medicine

## 2014-10-22 VITALS — BP 130/74 | HR 84 | Ht 62.0 in | Wt 191.2 lb

## 2014-10-22 DIAGNOSIS — J449 Chronic obstructive pulmonary disease, unspecified: Secondary | ICD-10-CM

## 2014-10-22 LAB — PULMONARY FUNCTION TEST
DL/VA % pred: 59 %
DL/VA: 2.71 ml/min/mmHg/L
DLCO UNC % PRED: 51 %
DLCO unc: 11.04 ml/min/mmHg
FEF 25-75 Post: 0.58 L/sec
FEF 25-75 Pre: 0.54 L/sec
FEF2575-%Change-Post: 7 %
FEF2575-%PRED-POST: 49 %
FEF2575-%Pred-Pre: 45 %
FEV1-%Change-Post: 3 %
FEV1-%PRED-POST: 83 %
FEV1-%Pred-Pre: 81 %
FEV1-Post: 1.4 L
FEV1-Pre: 1.36 L
FEV1FVC-%Change-Post: 0 %
FEV1FVC-%PRED-PRE: 77 %
FEV6-%Change-Post: 1 %
FEV6-%Pred-Post: 109 %
FEV6-%Pred-Pre: 108 %
FEV6-PRE: 2.3 L
FEV6-Post: 2.32 L
FEV6FVC-%CHANGE-POST: -1 %
FEV6FVC-%PRED-PRE: 102 %
FEV6FVC-%Pred-Post: 100 %
FVC-%Change-Post: 2 %
FVC-%Pred-Post: 107 %
FVC-%Pred-Pre: 105 %
FVC-PRE: 2.39 L
FVC-Post: 2.45 L
POST FEV6/FVC RATIO: 95 %
PRE FEV6/FVC RATIO: 96 %
Post FEV1/FVC ratio: 57 %
Pre FEV1/FVC ratio: 57 %
RV % PRED: 78 %
RV: 1.83 L
TLC % pred: 98 %
TLC: 4.68 L

## 2014-10-22 MED ORDER — PREDNISONE 2.5 MG PO TABS: 2.5000 mg | ORAL_TABLET | Freq: Every day | ORAL | Status: DC

## 2014-10-22 MED ORDER — FLUTICASONE FUROATE-VILANTEROL 100-25 MCG/INH IN AEPB: 1.0000 | INHALATION_SPRAY | Freq: Every morning | RESPIRATORY_TRACT | Status: DC

## 2014-10-22 NOTE — Progress Notes (Signed)
PFT done today. 

## 2014-10-22 NOTE — Progress Notes (Signed)
Subjective:    Patient ID: Megan Bradshaw, female    DOB: 06-02-1930  MRN: [id number]     Brief patient profile:  84 yowf quit smoking 1989 at wt around 135 with onset of sob around 2000 and dx as copd by pulmonary doctor but no need for any maint rx short prednisone courses until 2013 required 5 mg floor and arrived in [place]   referred by Golden Hurter to pulmonary clinic 08/12/14 with only GOLD I criteria 10/22/2014 so rec taper off prednisone by 11/2014    History of Present Illness  08/12/2014 1st [facility] office visit/ Megan Bradshaw   Chief Complaint  Patient presents with  . Pulmonary Consult    Self referral. Pt states dxed with COPD approx 15 yrs ago. She c/o DOE with bending over and "walking too much".      doe x sev hundred feet maybe once or twice a day albuterol plus alb neb each in addition to advair Worse since flare of cough rx  augmentin  rec Plan A = Automatic = Breo 2puffs off of one click each am  Plan B = backup = Only use your albuterol as a rescue medication to be used if you can't catch your breath by resting or doing a relaxed purse lip breathing pattern.  - The less you use it, the better it will work when you need it. - Ok to use up to 2 puffs  every 4 hours if you must but call for immediate appointment if use goes up over your usual need - Don't leave home without it !!  (think of it like the spare tire for your car)  Plan C = Nebulized albuterol up to every 4 hours Plan D = doctor call if A thru C not working Plan E = ER, go there if all else fails Prednisone should taper off if you can      09/23/2014 f/u ov/Megan Bradshaw re: copd ? Steroid dep at one quarter pred 5  Daily (1.25 mg daily ) Chief Complaint  Patient presents with  . Follow-up    Pt states that her breathing has improved since the last visit. No new co's today.  Breo costing 194 dollars a month  Doing 30 min on" treadmill with arms set on #2"  s stopping  No need for saba rescue   rec Continue your Breo for now We need for you to supply me in person with a copy of your drug formulary from your insurance re your copd to pick alternatives to [facility]  Continue the exercise as much you can for 30 min daily if possible Please schedule a follow up office visit in 4 weeks, sooner if needed with pfts  Late add:  Take 1.25 qod x 2 weeks and stop    10/22/2014 f/u ov/Megan Bradshaw re: pred 2.5 qod   GOLD I criteria on Breo maint  Chief Complaint  Patient presents with  . Follow-up    PFT done today. Pt states that her breathing is unchanged. No new co's today. She is using her rescue inhaler 2-3 x per wk on average.   no change in ex tol, no noct or am cough/ congestion / no need for saba but still using neb before her shower each day "just in case"  No obvious day to day or daytime variabilty or assoc chronic cough or cp or chest tightness, subjective wheeze overt sinus or hb symptoms. No unusual exp hx or h/o childhood pna/ asthma or  knowledge of premature birth.  Sleeping ok without nocturnal  or early am exacerbation  of respiratory  c/o's or need for noct saba. Also denies any obvious fluctuation of symptoms with weather or environmental changes or other aggravating or alleviating factors except as outlined above   Current Medications, Allergies, Complete Past Medical History, Past Surgical History, Family History, and Social History were reviewed in [employer] record.  ROS  The following are not active complaints unless bolded sore throat, dysphagia, dental problems, itching, sneezing,  nasal congestion or excess/ purulent secretions, ear ache,   fever, chills, sweats, unintended wt loss, pleuritic or exertional cp, hemoptysis,  orthopnea pnd or leg swelling, presyncope, palpitations, heartburn, abdominal pain, anorexia, nausea, vomiting, diarrhea  or change in bowel or urinary habits, change in stools or urine, dysuria,hematuria,  rash, arthralgias, visual  complaints, headache, numbness weakness or ataxia or problems with walking or coordination,  change in mood/affect or memory.                     Objective:   Physical Exam  amb mod obese wf nad  10/22/2014        191  Wt Readings from Last 3 Encounters:  09/23/14 195 lb (88.451 kg)  09/22/14 190 lb 4 oz (86.297 kg)  08/13/14 192 lb (87.091 kg)    Vital signs reviewed    HEENT mild turbinate edema. Top set dentures. Oropharynx no thrush or excess pnd or cobblestoning.  No JVD or cervical adenopathy. Mild accessory muscle hypertrophy. Trachea midline, nl thryroid. Chest was hyperinflated by percussion with diminished breath sounds and moderate increased exp time without wheeze. Hoover sign positive at mid inspiration. Regular rate and rhythm without murmur gallop or rub or increase P2 or edema.  Abd: no hsm, nl excursion. Ext warm without cyanosis or clubbing.       CXR  08/12/2014 :  1. Bibasilar and bilateral upper lobe subsegmental atelectasis and/or scarring. 2. No acute cardiopulmonary disease.      Assessment & Plan:

## 2014-10-22 NOTE — Assessment & Plan Note (Signed)
-   Trial of BREO  08/12/2014 > marked improvement 09/23/14 > taper steroids off x 2 weeks -  PFTs 10/22/2014  FEV1 1.40 (83%) ratio 57 and no change p B2 dlco 51 corrects to 59  Still overusing saba neb.  DDX of  difficult airways management all start with A and  include Adherence, Ace Inhibitors, Acid Reflux, Active Sinus Disease, Alpha 1 Antitripsin deficiency, Anxiety masquerading as Airways dz,  ABPA,  allergy(esp in young), Aspiration (esp in elderly), Adverse effects of DPI,  Active smokers, plus two Bs  = Bronchiectasis and Beta blocker use..and one C= CHF  Adherence is always the initial "prime suspect" and is a multilayered concern that requires a "trust but verify" approach in every patient - starting with knowing how to use medications, especially inhalers, correctly, keeping up with refills and understanding the fundamental difference between maintenance and prns vs those medications only taken for a very short course and then stopped and not refilled.  The proper method of use, as well as anticipated side effects, of a metered-dose inhaler are discussed and demonstrated to the patient. Improved effectiveness after extensive coaching during this visit to a level of approximately  75% so prefer she always try saba hfa before neb  ? Allergy/ ? Prednisone dep > try wean off @ q 3 days until first of year then d/c     Each maintenance medication was reviewed in detail including most importantly the difference between maintenance and as needed and under what circumstances the prns are to be used.  Please see instructions for details which were reviewed in writing and the patient given a copy.

## 2014-10-22 NOTE — Patient Instructions (Addendum)
Taper prednisone to 2.5 mg every 3rd day until first to the year then stop  Plan A = Automatic = Breo 2puffs off of one click each am   Plan B = backup = Only use your albuterol as a rescue medication to be used if you can't catch your breath by resting or doing a relaxed purse lip breathing pattern.  - The less you use it, the better it will work when you need it. - Ok to use up to 2 puffs  every 4 hours if you must but call for immediate appointment if use goes up over your usual need - Don't leave home without it !!  (think of it like the spare tire for your car)   Plan C = Nebulized albuterol up to every 4 hours only if you try Plan B and it doesn't work   Plan D = doctor call if A thru C not working  Plan E = ER, go there if all else fails   If you are satisfied with your treatment plan,  let your doctor know and he/she can either refill your medications or you can return here when your prescription runs out.     If in any way you are not 100% satisfied,  please tell us.  If 100% better, tell your friends!  Pulmonary follow up is as needed

## 2014-10-28 ENCOUNTER — Other Ambulatory Visit: Payer: Self-pay | Admitting: Internal Medicine

## 2014-11-29 ENCOUNTER — Other Ambulatory Visit: Payer: Self-pay | Admitting: Internal Medicine

## 2014-12-22 ENCOUNTER — Encounter: Payer: Self-pay | Admitting: Internal Medicine

## 2014-12-22 ENCOUNTER — Ambulatory Visit (INDEPENDENT_AMBULATORY_CARE_PROVIDER_SITE_OTHER): Payer: Medicare Other | Admitting: Internal Medicine

## 2014-12-22 VITALS — BP 130/80 | HR 73 | Temp 97.5°F | Wt 193.0 lb

## 2014-12-22 DIAGNOSIS — T380X5A Adverse effect of glucocorticoids and synthetic analogues, initial encounter: Secondary | ICD-10-CM

## 2014-12-22 DIAGNOSIS — E099 Drug or chemical induced diabetes mellitus without complications: Secondary | ICD-10-CM

## 2014-12-22 DIAGNOSIS — M25561 Pain in right knee: Secondary | ICD-10-CM

## 2014-12-22 DIAGNOSIS — J0100 Acute maxillary sinusitis, unspecified: Secondary | ICD-10-CM

## 2014-12-22 DIAGNOSIS — N390 Urinary tract infection, site not specified: Secondary | ICD-10-CM

## 2014-12-22 DIAGNOSIS — M25562 Pain in left knee: Secondary | ICD-10-CM

## 2014-12-22 DIAGNOSIS — R8271 Bacteriuria: Secondary | ICD-10-CM

## 2014-12-22 LAB — POCT URINALYSIS DIPSTICK
BILIRUBIN UA: NEGATIVE
Blood, UA: NEGATIVE
Glucose, UA: NEGATIVE
Ketones, UA: NEGATIVE
Leukocytes, UA: NEGATIVE
Nitrite, UA: POSITIVE
PH UA: 5.5
Protein, UA: NEGATIVE
Urobilinogen, UA: NEGATIVE

## 2014-12-22 LAB — HEMOGLOBIN A1C: Hgb A1c MFr Bld: 7.9 % — ABNORMAL HIGH (ref 4.6–6.5)

## 2014-12-22 MED ORDER — MELOXICAM 15 MG PO TABS: 15.0000 mg | ORAL_TABLET | Freq: Every day | ORAL | Status: DC

## 2014-12-22 MED ORDER — AMOXICILLIN-POT CLAVULANATE 875-125 MG PO TABS: 1.0000 | ORAL_TABLET | Freq: Two times a day (BID) | ORAL | Status: DC

## 2014-12-22 MED ORDER — METFORMIN HCL 500 MG PO TABS: 500.0000 mg | ORAL_TABLET | Freq: Two times a day (BID) | ORAL | Status: DC

## 2014-12-22 MED ORDER — GLIPIZIDE ER 5 MG PO TB24: 5.0000 mg | ORAL_TABLET | Freq: Every day | ORAL | Status: DC

## 2014-12-22 MED ORDER — LEVOTHYROXINE SODIUM 75 MCG PO TABS: 75.0000 ug | ORAL_TABLET | Freq: Every day | ORAL | Status: DC

## 2014-12-22 NOTE — Patient Instructions (Signed)

## 2014-12-22 NOTE — Progress Notes (Signed)
HPI  Megan Bradshaw is an 81 y.o. female presenting to the clinic today for f/u of chronic medical conditions.  DM2: Steroid induced. Currently Rx Metformin BID and glipizide but pt endorses not taking her Metformin properly. She has only been taking it once a day for several months because she didn't realize it was BID until her daughter pointed it out to her. She has now been taking it BID x 2 days. She has not been checking her sugars but is sweaty and light-headed in the mornings so takes her glucose tablet and feels better. She reports poor diet w/ lots of sweets. She has swelling on ankles but no leg or foot ulcerations. She does not have an eye exam yearly. She reports she had her flu shot this year and has had a pneumonia shot in the past but doesn't remember the year.  COPD: Gold I. Former smoker. PFTs 10/22/14 FEV1 1.40 (83%). On Breo and albuterol prn. Recently off prednisone and supp. O2. Follows w/ Dr. Melvyn Novas; pt reports no breathing changes since last visit.  She reports b/l knee pain since stopping the prednisone and would like something to help with her pain.  Has tried meloxicam in past and helped w/ pain. Had left knee replacement surgery in 2007.   She is concerned for UTI due to urinary "dribbling" because was dx in Nov. with the UTI feeling the same way w/ no other symptoms.  She reports sinus congestion x 2 weeks. Symptoms of nasal congestion, facial pain, fatigue, chills, runny nose w/ green mucous and PND. She does not use OTC because it interferes with her sleep. She does have COPD and asthma. Afraid this sinus problem might develop into further problems for her COPD if not treated immediately. No sick contacts. She says she hasn't smoked in a long time.  Past Medical History  Diagnosis Date  . Diabetes   . Asthma   . Thyroid disease   . COPD (chronic obstructive pulmonary disease)   . Arthritis    Family History  Problem Relation Age of Onset  . Arthritis Father   .  Diabetes Sister   . Diabetes Daughter   . Cancer Neg Hx   . Heart disease Neg Hx   . Stroke Neg Hx    History   Social History  . Marital Status: Unknown    Spouse Name: N/A    Number of Children: N/A  . Years of Education: N/A   Occupational History  . Retired    Social History Main Topics  . Smoking status: Former Smoker -- 1.50 packs/day for 30 years    Types: Cigarettes    Quit date: 11/15/1987  . Smokeless tobacco: Never Used  . Alcohol Use: No  . Drug Use: No  . Sexual Activity: Not on file   Other Topics Concern  . Not on file   Social History Narrative   No Known Allergies Current Outpatient Prescriptions on File Prior to Visit  Medication Sig Dispense Refill  . albuterol (VENTOLIN HFA) 108 (90 BASE) MCG/ACT inhaler Inhale 1-2 puffs into the lungs every 6 (six) hours as needed for wheezing or shortness of breath.    . cetirizine (ZYRTEC) 10 MG tablet Take 10 mg by mouth daily.    . Cholecalciferol (VITAMIN D PO) Take 1 tablet by mouth daily.    . Fluticasone Furoate-Vilanterol (BREO ELLIPTA) 100-25 MCG/INH AEPB Inhale 1 puff into the lungs every morning. 180 each 2  . Multiple Vitamin (MULTIVITAMIN) capsule Take  1 capsule by mouth daily.     No current facility-administered medications on file prior to visit.    Past Medical History  Diagnosis Date  . Diabetes   . Asthma   . Thyroid disease   . COPD (chronic obstructive pulmonary disease)   . Arthritis     Current Outpatient Prescriptions  Medication Sig Dispense Refill  . albuterol (VENTOLIN HFA) 108 (90 BASE) MCG/ACT inhaler Inhale 1-2 puffs into the lungs every 6 (six) hours as needed for wheezing or shortness of breath.    . cetirizine (ZYRTEC) 10 MG tablet Take 10 mg by mouth daily.    . Cholecalciferol (VITAMIN D PO) Take 1 tablet by mouth daily.    . Fluticasone Furoate-Vilanterol (BREO ELLIPTA) 100-25 MCG/INH AEPB Inhale 1 puff into the lungs every morning. 180 each 2  . glipiZIDE (GLUCOTROL  XL) 5 MG 24 hr tablet Take 1 tablet (5 mg total) by mouth daily with breakfast. 90 tablet 1  . levothyroxine (SYNTHROID, LEVOTHROID) 75 MCG tablet Take 1 tablet (75 mcg total) by mouth daily before breakfast. 90 tablet 1  . metFORMIN (GLUCOPHAGE) 500 MG tablet Take 1 tablet (500 mg total) by mouth 2 (two) times daily with a meal. 180 tablet 1  . Multiple Vitamin (MULTIVITAMIN) capsule Take 1 capsule by mouth daily.    Marland Kitchen amoxicillin-clavulanate (AUGMENTIN) 875-125 MG per tablet Take 1 tablet by mouth 2 (two) times daily. 20 tablet 0  . meloxicam (MOBIC) 15 MG tablet Take 1 tablet (15 mg total) by mouth daily. 90 tablet 1   No current facility-administered medications for this visit.    No Known Allergies  Family History  Problem Relation Age of Onset  . Arthritis Father   . Diabetes Sister   . Diabetes Daughter   . Cancer Neg Hx   . Heart disease Neg Hx   . Stroke Neg Hx     History   Social History  . Marital Status: Unknown    Spouse Name: N/A    Number of Children: N/A  . Years of Education: N/A   Occupational History  . Retired    Social History Main Topics  . Smoking status: Former Smoker -- 1.50 packs/day for 30 years    Types: Cigarettes    Quit date: 11/15/1987  . Smokeless tobacco: Never Used  . Alcohol Use: No  . Drug Use: No  . Sexual Activity: Not on file   Other Topics Concern  . Not on file   Social History Narrative   Constitutional: Positive fatigue and chills. Denies fever, malaise, headache or abrupt weight changes.  HEENT: Positive nasal congestion, facial pressure and pain, runny nose and PND. Denies headache, eye pain, eye redness, ear pain, ringing in the ears, wax buildup, bloody nose, or sore throat. Respiratory: Denies difficulty breathing, shortness of breath, cough or sputum production.   Cardiovascular: Denies chest pain, chest tightness, palpitations or swelling in the hands or feet.  Gastrointestinal: Denies abdominal pain, bloating,  constipation, diarrhea or blood in the stool.  GU: Denies urgency, frequency, pain with urination, burning sensation, blood in urine, odor or discharge. Musculoskeletal: Positive knee pain and ankle swelling. Denies decrease in range of motion and difficulty with gait.  Skin: Denies redness, rashes, lesions or ulcercations.  Neurological: Denies dizziness, difficulty with memory, difficulty with speech or problems with balance and coordination.   No other specific complaints in a complete review of systems (except as listed in HPI above).  BP 130/80 mmHg  Pulse 73  Temp(Src) 97.5 F (36.4 C) (Oral)  Wt 193 lb (87.544 kg)  SpO2 98% Wt Readings from Last 3 Encounters:  12/22/14 193 lb (87.544 kg)  10/22/14 191 lb 3.2 oz (86.728 kg)  09/23/14 195 lb (88.451 kg)    General: Appears her stated age, obese well developed, well nourished in NAD. Skin: Warm, dry and intact. No rashes, lesions or ulcerations noted. HEENT: Head: normal shape and size.  Frontal and maxillary sinus tenderness noted. Eyes: sclera white, no icterus, conjunctiva pink, PERRLA and EOMs intact; Ears: Tm's gray and intact, normal light reflex; Nose: erythematous mucosa and green mucous, septum midline; Throat/Mouth: + PND, erythematous mucosa, no exudate, lesions or ulcerations noted.  Neck: Neck supple, trachea midline. No masses, lumps or thyromegaly present.  Cardiovascular: Normal rate and rhythm. S1,S2 noted.  No murmur, rubs or gallops noted. No JVD. No carotid bruits noted. Pulmonary/Chest: Normal effort and positive vesicular breath sounds. No respiratory distress. No wheezes, rales or ronchi noted.  Abdomen: Soft and nontender. Normal bowel sounds, no bruits noted. No distention or masses noted. Liver, spleen and kidneys non palpable. Musculoskeletal: Left knee is tender to palpation; no redness, warmth or swelling; normal range of motion. Ankles have trace edema (Pt. attributes to wearing socks); no pain or  crepitus, full range of motion b/l. No difficulty with gait.  Neurological: Alert and oriented. Cranial nerves II-XII intact. Coordination normal.  Psychiatric: Mood and affect normal. Behavior is normal. Judgment and thought content normal.    BMET    Component Value Date/Time   NA 140 09/22/2014 1034   K 4.3 09/22/2014 1034   CL 103 09/22/2014 1034   CO2 23 09/22/2014 1034   GLUCOSE 124* 09/22/2014 1034   BUN 25* 09/22/2014 1034   CREATININE 0.8 09/22/2014 1034   CALCIUM 9.4 09/22/2014 1034    Lipid Panel     Component Value Date/Time   CHOL 162 09/22/2014 1034   TRIG 124.0 09/22/2014 1034   HDL 42.70 09/22/2014 1034   CHOLHDL 4 09/22/2014 1034   VLDL 24.8 09/22/2014 1034   [place] 95 09/22/2014 1034    CBC    Component Value Date/Time   WBC 8.9 09/22/2014 1034   RBC 4.76 09/22/2014 1034   HGB 14.1 09/22/2014 1034   HCT 43.3 09/22/2014 1034   PLT 310.0 09/22/2014 1034   MCV 91.0 09/22/2014 1034   MCHC 32.5 09/22/2014 1034   RDW 13.8 09/22/2014 1034    Hgb A1C Lab Results  Component Value Date   HGBA1C 8.4* 09/22/2014   Assessment and Plan:  Urinary incontinence, mild:  Urinalysis: positive nitrates; negative leuk.  Asymptomatic; no indication for antibiotic at this time  Acute Bacterial Sinusitis:  Augmentin BID for 10 days RTC as needed or if symptoms persist.  Knee pain, b/l:   Probable osteoarthritic pain; take Meloxicam 15 MB tablet PRN Counseling on not taking other NSAIDs.  DM2:   Recheck A1c; due to patient's Metformin admin error - will not change medications at this time if A1c is elevated. She will continue Metformin 500 MG BID and glipizide 5 MG. Counseling on diet/nutrition.  Return for f/u in 6 month.

## 2014-12-22 NOTE — Progress Notes (Signed)
Pre visit review using our clinic review tool, if applicable. No additional management support is needed unless otherwise documented below in the visit note. 

## 2014-12-29 NOTE — Addendum Note (Signed)
Addended by: Lurlean Nanny on: 12/29/2014 12:25 PM   Modules accepted: Orders

## 2015-02-09 ENCOUNTER — Encounter: Payer: Self-pay | Admitting: Primary Care

## 2015-02-09 ENCOUNTER — Ambulatory Visit (INDEPENDENT_AMBULATORY_CARE_PROVIDER_SITE_OTHER): Payer: Medicare Other | Admitting: Primary Care

## 2015-02-09 VITALS — BP 134/80 | HR 68 | Temp 97.6°F | Ht 62.0 in | Wt 194.0 lb

## 2015-02-09 DIAGNOSIS — N898 Other specified noninflammatory disorders of vagina: Secondary | ICD-10-CM

## 2015-02-09 DIAGNOSIS — L298 Other pruritus: Secondary | ICD-10-CM

## 2015-02-09 LAB — POCT URINALYSIS DIPSTICK
Bilirubin, UA: NEGATIVE
Blood, UA: NEGATIVE
GLUCOSE UA: NEGATIVE
Ketones, UA: NEGATIVE
Leukocytes, UA: NEGATIVE
PROTEIN UA: NEGATIVE
Spec Grav, UA: >=1.03
Urobilinogen, UA: 4
pH, UA: 6

## 2015-02-09 MED ORDER — FLUCONAZOLE 150 MG PO TABS
150.0000 mg | ORAL_TABLET | Freq: Once | ORAL | Status: DC
Start: 2015-02-09 — End: 2015-03-23

## 2015-02-09 NOTE — Progress Notes (Signed)
Pre visit review using our clinic review tool, if applicable. No additional management support is needed unless otherwise documented below in the visit note. 

## 2015-02-09 NOTE — Patient Instructions (Signed)
You've got a moderate amount of yeast present. Your urine test was negative for infection. Take the Fluconazole tablet by mouth once. Try Monistat vaginal cream over the counter for itching. Please call me if your symptoms do no improve.  Vaginitis Vaginitis is an inflammation of the vagina. It is most often caused by a change in the normal balance of the bacteria and yeast that live in the vagina. This change in balance causes an overgrowth of certain bacteria or yeast, which causes the inflammation. There are different types of vaginitis, but the most common types are:  Bacterial vaginosis.  Yeast infection (candidiasis).  Trichomoniasis vaginitis. This is a sexually transmitted infection (STI).  Viral vaginitis.  Atropic vaginitis.  Allergic vaginitis. CAUSES  The cause depends on the type of vaginitis. Vaginitis can be caused by:  Bacteria (bacterial vaginosis).  Yeast (yeast infection).  A parasite (trichomoniasis vaginitis)  A virus (viral vaginitis).  Low hormone levels (atrophic vaginitis). Low hormone levels can occur during pregnancy, breastfeeding, or after menopause.  Irritants, such as bubble baths, scented tampons, and feminine sprays (allergic vaginitis). Other factors can change the normal balance of the yeast and bacteria that live in the vagina. These include:  Antibiotic medicines.  Poor hygiene.  Diaphragms, vaginal sponges, spermicides, birth control pills, and intrauterine devices (IUD).  Sexual intercourse.  Infection.  Uncontrolled diabetes.  A weakened immune system. SYMPTOMS  Symptoms can vary depending on the cause of the vaginitis. Common symptoms include:  Abnormal vaginal discharge.  The discharge is white, gray, or yellow with bacterial vaginosis.  The discharge is thick, white, and cheesy with a yeast infection.  The discharge is frothy and yellow or greenish with trichomoniasis.  A bad vaginal odor.  The odor is fishy with  bacterial vaginosis.  Vaginal itching, pain, or swelling.  Painful intercourse.  Pain or burning when urinating. Sometimes, there are no symptoms. TREATMENT  Treatment will vary depending on the type of infection.   Bacterial vaginosis and trichomoniasis are often treated with antibiotic creams or pills.  Yeast infections are often treated with antifungal medicines, such as vaginal creams or suppositories.  Viral vaginitis has no cure, but symptoms can be treated with medicines that relieve discomfort. Your sexual partner should be treated as well.  Atrophic vaginitis may be treated with an estrogen cream, pill, suppository, or vaginal ring. If vaginal dryness occurs, lubricants and moisturizing creams may help. You may be told to avoid scented soaps, sprays, or douches.  Allergic vaginitis treatment involves quitting the use of the product that is causing the problem. Vaginal creams can be used to treat the symptoms. HOME CARE INSTRUCTIONS   Take all medicines as directed by your caregiver.  Keep your genital area clean and dry. Avoid soap and only rinse the area with water.  Avoid douching. It can remove the healthy bacteria in the vagina.  Do not use tampons or have sexual intercourse until your vaginitis has been treated. Use sanitary pads while you have vaginitis.  Wipe from front to back. This avoids the spread of bacteria from the rectum to the vagina.  Let air reach your genital area.  Wear cotton underwear to decrease moisture buildup.  Avoid wearing underwear while you sleep until your vaginitis is gone.  Avoid tight pants and underwear or nylons without a cotton panel.  Take off wet clothing (especially bathing suits) as soon as possible.  Use mild, non-scented products. Avoid using irritants, such as:  Scented feminine sprays.  Fabric softeners.  Scented detergents.  Scented tampons.  Scented soaps or bubble baths.  Practice safe sex and use condoms.  Condoms may prevent the spread of trichomoniasis and viral vaginitis. SEEK MEDICAL CARE IF:   You have abdominal pain.  You have a fever or persistent symptoms for more than 2-3 days.  You have a fever and your symptoms suddenly get worse. Document Released: 08/28/2007 Document Revised: 07/25/2012 Document Reviewed: 04/12/2012 [facility] Patient Information 2015 [place], [place]. This information is not intended to replace advice given to you by your health care provider. Make sure you discuss any questions you have with your health care provider.

## 2015-02-09 NOTE — Progress Notes (Signed)
Subjective:    Patient ID: Megan Bradshaw, female    DOB: 10/25/1930, 81 y.o.   MRN: [id number]  Vaginal Itching The patient's primary symptoms include vaginal discharge. The patient's pertinent negatives include no pelvic pain. This is a new problem. Pertinent negatives include no abdominal pain, chills, dysuria, fever, flank pain, frequency, hematuria or nausea.  Urinary Frequency  Pertinent negatives include no chills, flank pain, frequency, hematuria or nausea.    Megan Bradshaw is an 81 year old female who presents today with a chief complaint of vaginal itching that has been present for 3 weeks, especially after urination. She has not been on antibiotics for two months. She denies dysuria, frequency, urgency, vaginal pain, vaginal odor, or vaginal bleeding. She's tried vagisil extra strength cream and wipes over the couter with temporary relief. She's unsure if she's had vaginal discharge because she wears pads at night.  Review of Systems  Constitutional: Negative for fever and chills.  Respiratory: Negative for shortness of breath.   Cardiovascular: Negative for chest pain.  Gastrointestinal: Negative for nausea and abdominal pain.  Genitourinary: Positive for vaginal discharge. Negative for dysuria, frequency, hematuria, flank pain, vaginal bleeding, vaginal pain and pelvic pain.       She thinks she may have whitish discharge. Hard to notice with pad.       Past Medical History  Diagnosis Date  . Diabetes   . Asthma   . Thyroid disease   . COPD (chronic obstructive pulmonary disease)   . Arthritis     History   Social History  . Marital Status: Unknown    Spouse Name: N/A  . Number of Children: N/A  . Years of Education: N/A   Occupational History  . Retired    Social History Main Topics  . Smoking status: Former Smoker -- 1.50 packs/day for 30 years    Types: Cigarettes    Quit date: 11/15/1987  . Smokeless tobacco: Never Used  . Alcohol Use: No  . Drug Use:  No  . Sexual Activity: Not on file   Other Topics Concern  . Not on file   Social History Narrative    Past Surgical History  Procedure Laterality Date  . Cholecystectomy  1987  . Nasal sinus surgery  1990  . Appendectomy  1939   . Abdominal hysterectomy  1984  . Replacement total knee Right 2007  . Cataract extraction, bilateral      Family History  Problem Relation Age of Onset  . Arthritis Father   . Diabetes Sister   . Diabetes Daughter   . Cancer Neg Hx   . Heart disease Neg Hx   . Stroke Neg Hx     No Known Allergies  Current Outpatient Prescriptions on File Prior to Visit  Medication Sig Dispense Refill  . albuterol (VENTOLIN HFA) 108 (90 BASE) MCG/ACT inhaler Inhale 1-2 puffs into the lungs every 6 (six) hours as needed for wheezing or shortness of breath.    . cetirizine (ZYRTEC) 10 MG tablet Take 10 mg by mouth daily.    . Cholecalciferol (VITAMIN D PO) Take 1 tablet by mouth daily.    . Fluticasone Furoate-Vilanterol (BREO ELLIPTA) 100-25 MCG/INH AEPB Inhale 1 puff into the lungs every morning. 180 each 2  . glipiZIDE (GLUCOTROL XL) 5 MG 24 hr tablet Take 1 tablet (5 mg total) by mouth daily with breakfast. 90 tablet 1  . levothyroxine (SYNTHROID, LEVOTHROID) 75 MCG tablet Take 1 tablet (75 mcg total) by mouth  daily before breakfast. 90 tablet 1  . meloxicam (MOBIC) 15 MG tablet Take 1 tablet (15 mg total) by mouth daily. 90 tablet 1  . metFORMIN (GLUCOPHAGE) 500 MG tablet Take 1 tablet (500 mg total) by mouth 2 (two) times daily with a meal. 180 tablet 1  . Multiple Vitamin (MULTIVITAMIN) capsule Take 1 capsule by mouth daily.     No current facility-administered medications on file prior to visit.    BP 134/80 mmHg  Pulse 68  Temp(Src) 97.6 F (36.4 C) (Oral)  Ht 5\' 2"  (1.575 m)  Wt 194 lb (87.998 kg)  BMI 35.47 kg/m2  SpO2 98%    Objective:   Physical Exam  Constitutional: She is oriented to person, place, and time. She appears well-developed.    Cardiovascular: Normal rate and regular rhythm.   Pulmonary/Chest: Effort normal and breath sounds normal.  Abdominal: Soft. Bowel sounds are normal. There is no tenderness.  Genitourinary: Vaginal discharge found.  Whitish vaginal discharge noted on pelvic exam. No foul odor present.  Neurological: She is alert and oriented to person, place, and time.  Skin: Skin is warm and dry.  Psychiatric: She has a normal mood and affect.          Assessment & Plan:  Vaginitis: Wet prep performed and yeast noted. Treated with Fluconazole 150mg  tablet and recommended Monistat OTC for itching.  UA negative for infection, positive for nitrites, neg bacteria. This is likely due to overgrowth of yeast in vagina.  Follow up if no improvement in the next 3-4 days.

## 2015-03-04 DIAGNOSIS — S40862A Insect bite (nonvenomous) of left upper arm, initial encounter: Secondary | ICD-10-CM | POA: Diagnosis not present

## 2015-03-04 DIAGNOSIS — S30861A Insect bite (nonvenomous) of abdominal wall, initial encounter: Secondary | ICD-10-CM | POA: Diagnosis not present

## 2015-03-04 DIAGNOSIS — L821 Other seborrheic keratosis: Secondary | ICD-10-CM | POA: Diagnosis not present

## 2015-03-04 DIAGNOSIS — Z1283 Encounter for screening for malignant neoplasm of skin: Secondary | ICD-10-CM | POA: Diagnosis not present

## 2015-03-16 ENCOUNTER — Ambulatory Visit (INDEPENDENT_AMBULATORY_CARE_PROVIDER_SITE_OTHER): Payer: Medicare Other | Admitting: Internal Medicine

## 2015-03-16 ENCOUNTER — Encounter: Payer: Self-pay | Admitting: Internal Medicine

## 2015-03-16 VITALS — BP 142/90 | HR 87 | Temp 97.9°F

## 2015-03-16 DIAGNOSIS — J309 Allergic rhinitis, unspecified: Secondary | ICD-10-CM | POA: Diagnosis not present

## 2015-03-16 DIAGNOSIS — I8393 Asymptomatic varicose veins of bilateral lower extremities: Secondary | ICD-10-CM

## 2015-03-16 DIAGNOSIS — M722 Plantar fascial fibromatosis: Secondary | ICD-10-CM

## 2015-03-16 DIAGNOSIS — I8390 Asymptomatic varicose veins of unspecified lower extremity: Secondary | ICD-10-CM

## 2015-03-16 MED ORDER — METHYLPREDNISOLONE ACETATE 80 MG/ML IJ SUSP
80.0000 mg | Freq: Once | INTRAMUSCULAR | Status: AC
  Administered 2015-03-16: 80 mg via INTRAMUSCULAR

## 2015-03-16 NOTE — Addendum Note (Signed)
Addended by: Lurlean Nanny on: 03/16/2015 02:18 PM   Modules accepted: Orders

## 2015-03-16 NOTE — Progress Notes (Signed)
HPI  Pt presents to the clinic today with c/o chest congestion, cough, sore throat and nasal congestion. This started 2 weeks ago. The cough is non productive. She has had some post nasal drip. She denies fever, chills or body aches. She has been taking Zyrtec, her inhaler and nebulizer with some relief.  She also reports pain in her left shin. This started y2-3 years ago but seems to be worse yesterday after she hit her shin on the dishwasher door. She denies any bruising to the area. She is concerned that she may have broken a bone and is requesting an xray. She has not taken anything OTC for pain. She has no difficulty walking.  She also reports pain in her feet. This has been going on for months. It is worse first thing in the morning when she goes to get up out of bed. She describes the pain as sharp and shooting. She denies numbness or tingling in her feet. The pain dose get better throughout the day. She denies any injury to her back.  Review of Systems    Past Medical History  Diagnosis Date  . Diabetes   . Asthma   . Thyroid disease   . COPD (chronic obstructive pulmonary disease)   . Arthritis     Family History  Problem Relation Age of Onset  . Arthritis Father   . Diabetes Sister   . Diabetes Daughter   . Cancer Neg Hx   . Heart disease Neg Hx   . Stroke Neg Hx     History   Social History  . Marital Status: Unknown    Spouse Name: N/A  . Number of Children: N/A  . Years of Education: N/A   Occupational History  . Retired    Social History Main Topics  . Smoking status: Former Smoker -- 1.50 packs/day for 30 years    Types: Cigarettes    Quit date: 11/15/1987  . Smokeless tobacco: Never Used  . Alcohol Use: No  . Drug Use: No  . Sexual Activity: Not on file   Other Topics Concern  . Not on file   Social History Narrative    No Known Allergies   Constitutional: Positive headache,. Denies headache, fever or abrupt weight changes.  HEENT:  Positive  nasal congestion and sore throat. Denies eye redness, ear pain, ringing in the ears, wax buildup, runny nose or bloody nose. Respiratory: Positive cough. Denies difficulty breathing or shortness of breath.  Cardiovascular: Denies chest pain, chest tightness, palpitations or swelling in the hands or feet.  MSK: Pt reports pain in left shin and pain in feet. Denies difficulty in ROM, muscle pain, joint pain or swelling. Neuro: Denies numbness or tingling in feet, problems with balance and coordination.  No other specific complaints in a complete review of systems (except as listed in HPI above).  Objective:  BP 142/90 mmHg  Pulse 87  Temp(Src) 97.9 F (36.6 C) (Oral)  Wt   SpO2 98%   General: Appears her stated age, obese in NAD. HEENT: Head: normal shape and size, no sinus tenderness noted; Eyes: sclera white, no icterus, conjunctiva pink; Ears: Tm's gray and intact, normal light reflex; Nose: mucosa boggy and moist, septum midline; Throat/Mouth: + PND. Teeth present, mucosa pink and moist, no exudate noted, no lesions or ulcerations noted.  Neck: No adenopathy noted.  Cardiovascular: Normal rate and rhythm. S1,S2 noted.  No murmur, rubs or gallops noted. Varicose vein noted of left shin, not thrombosed. No  warmth or redness noted. Pulmonary/Chest: Normal effort and positive vesicular breath sounds. No respiratory distress. No wheezes, rales or ronchi noted.  MSK: No pain with palpation over the tibia, no swelling noted. Normal flexion, extension and rotation of the left ankle. No pain with palpation of the plantar fascia bilaterally. Gait normal.    Assessment & Plan:   Allergic Rhinitis   80 mg Depo IM today Flonase 2 sprays each nostril for 3 days and then as needed. Change Zyrtec to Allegra  Varicose veins:  She reports a history of this in the past She reports that she can not wear compression hose She is not interested in referral to a vascular specialist  Plantar  Fasciitis, bilateral:  Advised her to try rolling her feet over a can or bottle of water in the morning to massage the plantar fascia She should continue her Mobic daily  RTC as needed or if symptoms persist or worsen RTC as needed or if symptoms persist.

## 2015-03-16 NOTE — Patient Instructions (Signed)

## 2015-03-16 NOTE — Progress Notes (Signed)
Pre visit review using our clinic review tool, if applicable. No additional management support is needed unless otherwise documented below in the visit note. 

## 2015-03-23 ENCOUNTER — Encounter: Payer: Self-pay | Admitting: Internal Medicine

## 2015-03-23 ENCOUNTER — Ambulatory Visit (INDEPENDENT_AMBULATORY_CARE_PROVIDER_SITE_OTHER): Payer: Medicare Other | Admitting: Internal Medicine

## 2015-03-23 VITALS — BP 134/78 | HR 71 | Temp 98.0°F | Wt 193.0 lb

## 2015-03-23 DIAGNOSIS — J449 Chronic obstructive pulmonary disease, unspecified: Secondary | ICD-10-CM | POA: Diagnosis not present

## 2015-03-23 DIAGNOSIS — E039 Hypothyroidism, unspecified: Secondary | ICD-10-CM | POA: Diagnosis not present

## 2015-03-23 DIAGNOSIS — T380X5A Adverse effect of glucocorticoids and synthetic analogues, initial encounter: Secondary | ICD-10-CM | POA: Diagnosis not present

## 2015-03-23 DIAGNOSIS — R5383 Other fatigue: Secondary | ICD-10-CM | POA: Diagnosis not present

## 2015-03-23 DIAGNOSIS — E099 Drug or chemical induced diabetes mellitus without complications: Secondary | ICD-10-CM | POA: Diagnosis not present

## 2015-03-23 DIAGNOSIS — E669 Obesity, unspecified: Secondary | ICD-10-CM

## 2015-03-23 LAB — CBC
HCT: 44 % (ref 36.0–46.0)
Hemoglobin: 14.8 g/dL (ref 12.0–15.0)
MCHC: 33.6 g/dL (ref 30.0–36.0)
MCV: 89.3 fl (ref 78.0–100.0)
Platelets: 337 10*3/uL (ref 150.0–400.0)
RBC: 4.93 Mil/uL (ref 3.87–5.11)
RDW: 14.7 % (ref 11.5–15.5)
WBC: 11.3 10*3/uL — ABNORMAL HIGH (ref 4.0–10.5)

## 2015-03-23 LAB — TSH: TSH: 1.88 u[IU]/mL (ref 0.35–4.50)

## 2015-03-23 LAB — LIPID PANEL
Cholesterol: 178 mg/dL (ref 0–200)
HDL: 62.1 mg/dL (ref >39.00)
LDL Cholesterol: 87 mg/dL (ref 0–99)
NONHDL: 115.9
Total CHOL/HDL Ratio: 3
Triglycerides: 146 mg/dL (ref 0.0–149.0)
VLDL: 29.2 mg/dL (ref 0.0–40.0)

## 2015-03-23 LAB — COMPREHENSIVE METABOLIC PANEL
ALBUMIN: 3.9 g/dL (ref 3.5–5.2)
ALK PHOS: 71 U/L (ref 39–117)
ALT: 18 U/L (ref 0–35)
AST: 19 U/L (ref 0–37)
BUN: 30 mg/dL — AB (ref 6–23)
CO2: 31 mEq/L (ref 19–32)
Calcium: 9.9 mg/dL (ref 8.4–10.5)
Chloride: 99 mEq/L (ref 96–112)
Creatinine, Ser: 0.78 mg/dL (ref 0.40–1.20)
GFR: 75.02 mL/min (ref >60.00)
Glucose, Bld: 121 mg/dL — ABNORMAL HIGH (ref 70–99)
POTASSIUM: 4.1 meq/L (ref 3.5–5.1)
Sodium: 136 mEq/L (ref 135–145)
Total Bilirubin: 0.3 mg/dL (ref 0.2–1.2)
Total Protein: 7.3 g/dL (ref 6.0–8.3)

## 2015-03-23 LAB — VITAMIN B12: VITAMIN B 12: 716 pg/mL (ref 211–911)

## 2015-03-23 LAB — T4, FREE: Free T4: 0.88 ng/dL (ref 0.60–1.60)

## 2015-03-23 LAB — HEMOGLOBIN A1C: Hgb A1c MFr Bld: 7.5 % — ABNORMAL HIGH (ref 4.6–6.5)

## 2015-03-23 LAB — VITAMIN D 25 HYDROXY (VIT D DEFICIENCY, FRACTURES): VITD: 62.67 ng/mL (ref 30.00–100.00)

## 2015-03-23 MED ORDER — LEVOTHYROXINE SODIUM 75 MCG PO TABS: 75.0000 ug | ORAL_TABLET | Freq: Every day | ORAL | Status: DC

## 2015-03-23 MED ORDER — GLIPIZIDE ER 5 MG PO TB24: 5.0000 mg | ORAL_TABLET | Freq: Every day | ORAL | Status: DC

## 2015-03-23 MED ORDER — METFORMIN HCL 500 MG PO TABS: 500.0000 mg | ORAL_TABLET | Freq: Two times a day (BID) | ORAL | Status: DC

## 2015-03-23 NOTE — Progress Notes (Signed)
Subjective:    Patient ID: Megan Bradshaw, female    DOB: 1930-06-08, 81 y.o.   MRN: [id number]  HPI  Pt presents to the clinic today for 6 month follow up of chronic medical conditions.  COPD: [employer] daily. Rarely uses albuterol. She does follow with Dr. Melvyn Novas, pulmonologist.   Hypothyroid: Denies any issues on her current dose of Synthroid. Denies cold sensation, weight gain, excessive dry skin or constipation.  Obesity: Not following any specific diet. Weight stable at 193. Does 30 minutes of walking on the treadmill every day.   Steroid Induced DM 2: Her last A1C was 7.9%. She does not test her sugars. She is taking Metformin and Glipizide daily. Her last eye exam was 4 or 5 years ago. Flu 08/2014. Pneumovax: Unsure of exact date, 5 or 6 years ago in [place]. Prevnar: Unsure of exact date, 5 or 6 years ago in [place]. She denies numbness or tingling in her hands or feet.  She is complaining of increased fatigue and is wanting her vitamin B12 levels checked today. She is taking Vitamin D daily.   Review of Systems      Past Medical History  Diagnosis Date  . Diabetes   . Asthma   . Thyroid disease   . COPD (chronic obstructive pulmonary disease)   . Arthritis     Current Outpatient Prescriptions  Medication Sig Dispense Refill  . albuterol (VENTOLIN HFA) 108 (90 BASE) MCG/ACT inhaler Inhale 1-2 puffs into the lungs every 6 (six) hours as needed for wheezing or shortness of breath.    . cetirizine (ZYRTEC) 10 MG tablet Take 10 mg by mouth daily.    . Cholecalciferol (VITAMIN D PO) Take 1 tablet by mouth daily.    . fluconazole (DIFLUCAN) 150 MG tablet Take 1 tablet (150 mg total) by mouth once. 1 tablet 0  . Fluticasone Furoate-Vilanterol (BREO ELLIPTA) 100-25 MCG/INH AEPB Inhale 1 puff into the lungs every morning. 180 each 2  . glipiZIDE (GLUCOTROL XL) 5 MG 24 hr tablet Take 1 tablet (5 mg total) by mouth daily with breakfast. 90 tablet 1  . levothyroxine (SYNTHROID,  LEVOTHROID) 75 MCG tablet Take 1 tablet (75 mcg total) by mouth daily before breakfast. 90 tablet 1  . meloxicam (MOBIC) 15 MG tablet Take 1 tablet (15 mg total) by mouth daily. 90 tablet 1  . metFORMIN (GLUCOPHAGE) 500 MG tablet Take 1 tablet (500 mg total) by mouth 2 (two) times daily with a meal. 180 tablet 1  . Multiple Vitamin (MULTIVITAMIN) capsule Take 1 capsule by mouth daily.     No current facility-administered medications for this visit.    No Known Allergies  Family History  Problem Relation Age of Onset  . Arthritis Father   . Diabetes Sister   . Diabetes Daughter   . Cancer Neg Hx   . Heart disease Neg Hx   . Stroke Neg Hx     History   Social History  . Marital Status: Unknown    Spouse Name: N/A  . Number of Children: N/A  . Years of Education: N/A   Occupational History  . Retired    Social History Main Topics  . Smoking status: Former Smoker -- 1.50 packs/day for 30 years    Types: Cigarettes    Quit date: 11/15/1987  . Smokeless tobacco: Never Used  . Alcohol Use: No  . Drug Use: No  . Sexual Activity: Not on file   Other Topics Concern  .  Not on file   Social History Narrative     Constitutional: Pt reports fatigue. Denies fever, malaise, headache or abrupt weight changes.  Respiratory: Denies difficulty breathing, shortness of breath, cough or sputum production.   Cardiovascular: Denies chest pain, chest tightness, palpitations or swelling in the hands or feet.  Gastrointestinal: Denies abdominal pain, bloating, constipation, diarrhea or blood in the stool.  Skin: Denies redness, rashes, lesions or ulcercations.  Neurological: Denies dizziness, difficulty with memory, difficulty with speech or problems with balance and coordination.  Psych: Denies anxiety, depression, SI/HI.  No other specific complaints in a complete review of systems (except as listed in HPI above).  Objective:   Physical Exam   BP 134/78 mmHg  Pulse 71  Temp(Src)  98 F (36.7 C) (Oral)  Wt 193 lb (87.544 kg)  SpO2 98% Wt Readings from Last 3 Encounters:  03/23/15 193 lb (87.544 kg)  02/09/15 194 lb (87.998 kg)  12/22/14 193 lb (87.544 kg)    General: Appears her stated age, obese in NAD. Skin: Warm, dry and intact. No rashes, lesions or ulcerations noted. HEENT: Head: normal shape and size; Eyes: sclera white, no icterus, conjunctiva pink, PERRLA and EOMs intact;  Neck: Neck supple, trachea midline. No masses, lumps or thyromegaly present.  Cardiovascular: Normal rate and rhythm. S1,S2 noted.  No murmur, rubs or gallops noted.  Pulmonary/Chest: Normal effort and positive vesicular breath sounds. No respiratory distress. No wheezes, rales or ronchi noted.  Abdomen: Soft and nontender. Normal bowel sounds, no bruits noted. No distention or masses noted. Liver, spleen and kidneys non palpable. Neurological: Alert and oriented.  Psychiatric: Mood and affect normal. Behavior is normal. Judgment and thought content normal.     BMET    Component Value Date/Time   NA 140 09/22/2014 1034   K 4.3 09/22/2014 1034   CL 103 09/22/2014 1034   CO2 23 09/22/2014 1034   GLUCOSE 124* 09/22/2014 1034   BUN 25* 09/22/2014 1034   CREATININE 0.8 09/22/2014 1034   CALCIUM 9.4 09/22/2014 1034    Lipid Panel     Component Value Date/Time   CHOL 162 09/22/2014 1034   TRIG 124.0 09/22/2014 1034   HDL 42.70 09/22/2014 1034   CHOLHDL 4 09/22/2014 1034   VLDL 24.8 09/22/2014 1034   LDLCALC 95 09/22/2014 1034    CBC    Component Value Date/Time   WBC 8.9 09/22/2014 1034   RBC 4.76 09/22/2014 1034   HGB 14.1 09/22/2014 1034   HCT 43.3 09/22/2014 1034   PLT 310.0 09/22/2014 1034   MCV 91.0 09/22/2014 1034   MCHC 32.5 09/22/2014 1034   RDW 13.8 09/22/2014 1034    Hgb A1C Lab Results  Component Value Date   HGBA1C 7.9* 12/22/2014        Assessment & Plan:   Fatigue:  Will check Vit B 12 and Vit D levels today  RTC in 6 months for medicare  wellness exam/follow up

## 2015-03-23 NOTE — Assessment & Plan Note (Signed)
Encouraged her to work on diet and exercise 

## 2015-03-23 NOTE — Assessment & Plan Note (Signed)
Will check TSH and Free T 4 today Synthroid will be refilled pending lab results

## 2015-03-23 NOTE — Progress Notes (Signed)
Pre visit review using our clinic review tool, if applicable. No additional management support is needed unless otherwise documented below in the visit note. 

## 2015-03-23 NOTE — Patient Instructions (Signed)
Diabetes and Foot Care Diabetes may cause you to have problems because of poor blood supply (circulation) to your feet and legs. This may cause the skin on your feet to become thinner, break easier, and heal more slowly. Your skin may become dry, and the skin may peel and crack. You may also have nerve damage in your legs and feet causing decreased feeling in them. You may not notice minor injuries to your feet that could lead to infections or more serious problems. Taking care of your feet is one of the most important things you can do for yourself.  HOME CARE INSTRUCTIONS  Wear shoes at all times, even in the house. Do not go barefoot. Bare feet are easily injured.  Check your feet daily for blisters, cuts, and redness. If you cannot see the bottom of your feet, use a mirror or ask someone for help.  Wash your feet with warm water (do not use hot water) and mild soap. Then pat your feet and the areas between your toes until they are completely dry. Do not soak your feet as this can dry your skin.  Apply a moisturizing lotion or petroleum jelly (that does not contain alcohol and is unscented) to the skin on your feet and to dry, brittle toenails. Do not apply lotion between your toes.  Trim your toenails straight across. Do not dig under them or around the cuticle. File the edges of your nails with an emery board or nail file.  Do not cut corns or calluses or try to remove them with medicine.  Wear clean socks or stockings every day. Make sure they are not too tight. Do not wear knee-high stockings since they may decrease blood flow to your legs.  Wear shoes that fit properly and have enough cushioning. To break in new shoes, wear them for just a few hours a day. This prevents you from injuring your feet. Always look in your shoes before you put them on to be sure there are no objects inside.  Do not cross your legs. This may decrease the blood flow to your feet.  If you find a minor scrape,  cut, or break in the skin on your feet, keep it and the skin around it clean and dry. These areas may be cleansed with mild soap and water. Do not cleanse the area with peroxide, alcohol, or iodine.  When you remove an adhesive bandage, be sure not to damage the skin around it.  If you have a wound, look at it several times a day to make sure it is healing.  Do not use heating pads or hot water bottles. They may burn your skin. If you have lost feeling in your feet or legs, you may not know it is happening until it is too late.  Make sure your health care provider performs a complete foot exam at least annually or more often if you have foot problems. Report any cuts, sores, or bruises to your health care provider immediately. SEEK MEDICAL CARE IF:   You have an injury that is not healing.  You have cuts or breaks in the skin.  You have an ingrown nail.  You notice redness on your legs or feet.  You feel burning or tingling in your legs or feet.  You have pain or cramps in your legs and feet.  Your legs or feet are numb.  Your feet always feel cold. SEEK IMMEDIATE MEDICAL CARE IF:   There is increasing redness,   swelling, or pain in or around a wound.  There is a red line that goes up your leg.  Pus is coming from a wound.  You develop a fever or as directed by your health care provider.  You notice a bad smell coming from an ulcer or wound. Document Released: 10/28/2000 Document Revised: 07/03/2013 Document Reviewed: 04/09/2013 [employer] Patient Information 2015 [employer]. This information is not intended to replace advice given to you by your health care provider. Make sure you discuss any questions you have with your health care provider.  

## 2015-03-23 NOTE — Assessment & Plan Note (Signed)
Using Breo daily Rare use of albuterol

## 2015-03-23 NOTE — Assessment & Plan Note (Signed)
Will check A1C and microalbumin today Foot exam today Encouraged her to make an appt with an eye doctor to r/o retinopathy Will request immunization record from [place] Encouraged her to consume a diabetic diet Metformin and Glipizide refilled today

## 2015-03-24 NOTE — Addendum Note (Signed)
Addended by: Ellamae Sia on: 03/24/2015 09:52 AM   Modules accepted: Orders

## 2015-03-30 ENCOUNTER — Other Ambulatory Visit: Payer: Self-pay | Admitting: Internal Medicine

## 2015-03-30 NOTE — Telephone Encounter (Signed)
eRx sent

## 2015-03-30 NOTE — Telephone Encounter (Signed)
Please advise if okay to fill  

## 2015-05-21 ENCOUNTER — Encounter: Payer: Self-pay | Admitting: Internal Medicine

## 2015-05-21 ENCOUNTER — Ambulatory Visit (INDEPENDENT_AMBULATORY_CARE_PROVIDER_SITE_OTHER): Payer: Medicare Other | Admitting: Internal Medicine

## 2015-05-21 VITALS — BP 140/70 | HR 99 | Temp 98.3°F | Resp 12 | Wt 198.0 lb

## 2015-05-21 DIAGNOSIS — J0100 Acute maxillary sinusitis, unspecified: Secondary | ICD-10-CM | POA: Insufficient documentation

## 2015-05-21 DIAGNOSIS — J449 Chronic obstructive pulmonary disease, unspecified: Secondary | ICD-10-CM | POA: Diagnosis not present

## 2015-05-21 DIAGNOSIS — J019 Acute sinusitis, unspecified: Secondary | ICD-10-CM | POA: Insufficient documentation

## 2015-05-21 MED ORDER — AMOXICILLIN 500 MG PO TABS: 1000.0000 mg | ORAL_TABLET | Freq: Two times a day (BID) | ORAL | Status: DC

## 2015-05-21 NOTE — Progress Notes (Signed)
Pre visit review using our clinic review tool, if applicable. No additional management support is needed unless otherwise documented below in the visit note. 

## 2015-05-21 NOTE — Progress Notes (Signed)
Subjective:    Patient ID: Megan Bradshaw, female    DOB: 1930-04-20, 81 y.o.   MRN: [id number]  HPI Here due to trouble with nasal congestion Lots of post nasal drip Chills and sweats Low grade fever this AM Feels fatigue Cough at night This started last week but gradually worsening  Continues on her usual breathing meds Has increased SOB--- more than expected with the humidity now Some blood when blowing nose  Current Outpatient Prescriptions on File Prior to Visit  Medication Sig Dispense Refill  . albuterol (VENTOLIN HFA) 108 (90 BASE) MCG/ACT inhaler Inhale 1-2 puffs into the lungs every 6 (six) hours as needed for wheezing or shortness of breath.    . cetirizine (ZYRTEC) 10 MG tablet Take 10 mg by mouth daily.    . Cholecalciferol (VITAMIN D PO) Take 1 tablet by mouth daily.    . Fluticasone Furoate-Vilanterol (BREO ELLIPTA) 100-25 MCG/INH AEPB Inhale 1 puff into the lungs every morning. 180 each 2  . glipiZIDE (GLUCOTROL XL) 5 MG 24 hr tablet Take 1 tablet (5 mg total) by mouth daily with breakfast. 90 tablet 1  . levothyroxine (SYNTHROID, LEVOTHROID) 75 MCG tablet Take 1 tablet (75 mcg total) by mouth daily before breakfast. 90 tablet 1  . meloxicam (MOBIC) 15 MG tablet Take 1 tablet (15 mg total) by mouth daily. 90 tablet 1  . metFORMIN (GLUCOPHAGE) 500 MG tablet Take 1 tablet (500 mg total) by mouth 2 (two) times daily with a meal. 180 tablet 1  . Multiple Vitamin (MULTIVITAMIN) capsule Take 1 capsule by mouth daily.    . predniSONE (DELTASONE) 5 MG tablet TAKE 1 TABLET BY MOUTH EVERY DAY 90 tablet 0   No current facility-administered medications on file prior to visit.    No Known Allergies  Past Medical History  Diagnosis Date  . Diabetes   . Asthma   . Thyroid disease   . COPD (chronic obstructive pulmonary disease)   . Arthritis     Past Surgical History  Procedure Laterality Date  . Cholecystectomy  1987  . Nasal sinus surgery  1990  . Appendectomy   1939   . Abdominal hysterectomy  1984  . Replacement total knee Right 2007  . Cataract extraction, bilateral      Family History  Problem Relation Age of Onset  . Arthritis Father   . Diabetes Sister   . Diabetes Daughter   . Cancer Neg Hx   . Heart disease Neg Hx   . Stroke Neg Hx     History   Social History  . Marital Status: Unknown    Spouse Name: N/A  . Number of Children: N/A  . Years of Education: N/A   Occupational History  . Retired    Social History Main Topics  . Smoking status: Former Smoker -- 1.50 packs/day for 30 years    Types: Cigarettes    Quit date: 11/15/1987  . Smokeless tobacco: Never Used  . Alcohol Use: No  . Drug Use: No  . Sexual Activity: Not on file   Other Topics Concern  . Not on file   Social History Narrative   Review of Systems Increased urinary frequency at night--no dysuria No rash No vomiting or diarrhea Eating okay    Objective:   Physical Exam  Constitutional: She appears well-developed. No distress.  HENT:  Mouth/Throat: Oropharynx is clear and moist. No oropharyngeal exudate.  Moderate maxillary tenderness TMs normal Moderate nasal swelling  Neck: Normal range of  motion. Neck supple. No thyromegaly present.  Mildly tender anterior cervical nodes  Pulmonary/Chest: Effort normal and breath sounds normal. No respiratory distress. She has no wheezes. She has no rales.          Assessment & Plan:

## 2015-05-21 NOTE — Assessment & Plan Note (Signed)
Doesn't appear to be exacerbated She will continue all her regular meds

## 2015-05-21 NOTE — Assessment & Plan Note (Signed)
Symptoms over 1 week and worsening Some systemic symptoms Will try amoxil-- change to augmentin next week if not improved

## 2015-05-27 ENCOUNTER — Telehealth: Payer: Self-pay

## 2015-05-27 NOTE — Telephone Encounter (Signed)
Megan Bradshaw is gone for the afternoon. Please see note below

## 2015-05-27 NOTE — Telephone Encounter (Signed)
Pt was seen on 05/21/15 and is still taking abx.; now pt said head is still congested and having hard time breathing due to the humidity. Pt said she was not in distress. Pt taking prednisone and using nebulizer. Pt wanted to see if should wait and finish abx or Dr Silvio Pate discussed at 05/21/15 appt might change abx if continue with symptoms. Pt said if needed she will come back in to be seen. Pt request cb.

## 2015-05-27 NOTE — Telephone Encounter (Signed)
Have her continue abx until it is finished. Add in Flonase or Nasocort OTC. She should make follow up appt to be seen if symptoms persist after she finishes antibiotic

## 2015-05-28 NOTE — Telephone Encounter (Signed)
Pt called. Gave her Regina's recommendations and pt verbalized understanding.

## 2015-06-19 ENCOUNTER — Ambulatory Visit (INDEPENDENT_AMBULATORY_CARE_PROVIDER_SITE_OTHER): Payer: Medicare Other | Admitting: Internal Medicine

## 2015-06-19 ENCOUNTER — Encounter: Payer: Self-pay | Admitting: Internal Medicine

## 2015-06-19 VITALS — BP 134/84 | HR 81 | Temp 97.8°F | Wt 199.0 lb

## 2015-06-19 DIAGNOSIS — E099 Drug or chemical induced diabetes mellitus without complications: Secondary | ICD-10-CM | POA: Diagnosis not present

## 2015-06-19 DIAGNOSIS — T380X5A Adverse effect of glucocorticoids and synthetic analogues, initial encounter: Secondary | ICD-10-CM

## 2015-06-19 DIAGNOSIS — J0101 Acute recurrent maxillary sinusitis: Secondary | ICD-10-CM

## 2015-06-19 DIAGNOSIS — E039 Hypothyroidism, unspecified: Secondary | ICD-10-CM

## 2015-06-19 MED ORDER — LEVOTHYROXINE SODIUM 75 MCG PO TABS: 75.0000 ug | ORAL_TABLET | Freq: Every day | ORAL | Status: DC

## 2015-06-19 MED ORDER — HYDROCODONE-HOMATROPINE 5-1.5 MG/5ML PO SYRP: 5.0000 mL | ORAL_SOLUTION | Freq: Three times a day (TID) | ORAL | Status: DC | PRN

## 2015-06-19 MED ORDER — PREDNISONE 10 MG PO TABS: ORAL_TABLET | ORAL | Status: DC

## 2015-06-19 MED ORDER — DOXYCYCLINE HYCLATE 100 MG PO TABS: 100.0000 mg | ORAL_TABLET | Freq: Two times a day (BID) | ORAL | Status: DC

## 2015-06-19 MED ORDER — PREDNISONE 5 MG PO TABS: 5.0000 mg | ORAL_TABLET | Freq: Every day | ORAL | Status: DC

## 2015-06-19 MED ORDER — GLIPIZIDE ER 5 MG PO TB24: 5.0000 mg | ORAL_TABLET | Freq: Every day | ORAL | Status: DC

## 2015-06-19 NOTE — Patient Instructions (Signed)

## 2015-06-19 NOTE — Progress Notes (Signed)
Pre visit review using our clinic review tool, if applicable. No additional management support is needed unless otherwise documented below in the visit note. 

## 2015-06-19 NOTE — Progress Notes (Signed)
HPI  Pt presents to the clinic with c/o post nasal drip, runny nose, scratchy throat and cough. This has been going on for the about 1 month but has gotten worse in the last week. She is blowing clear/green mucous out of her nose. The cough is. The cough seems worse at night. She denies fever, chills or body aches. She is taking Zyrtec daily. She was just treated for a sinus infection on 05/21/15 with Amoxicillin. Her symptoms did improved during that time but have now returned. She does have a history of asthma and COPD.  She is in need of refills of Prednisone, Synthroid and Glucotrol. She had a follow up of chronic conditions 03/2015- note and labs reviewed.  Review of Systems    Past Medical History  Diagnosis Date  . Diabetes   . Asthma   . Thyroid disease   . COPD (chronic obstructive pulmonary disease)   . Arthritis     Family History  Problem Relation Age of Onset  . Arthritis Father   . Diabetes Sister   . Diabetes Daughter   . Cancer Neg Hx   . Heart disease Neg Hx   . Stroke Neg Hx     History   Social History  . Marital Status: Unknown    Spouse Name: N/A  . Number of Children: N/A  . Years of Education: N/A   Occupational History  . Retired    Social History Main Topics  . Smoking status: Former Smoker -- 1.50 packs/day for 30 years    Types: Cigarettes    Quit date: 11/15/1987  . Smokeless tobacco: Never Used  . Alcohol Use: No  . Drug Use: No  . Sexual Activity: Not on file   Other Topics Concern  . Not on file   Social History Narrative    No Known Allergies   Constitutional: Denies headache, fatigue, fever or abrupt weight changes.  HEENT:  Positive runny nose and sore throat. Denies eye redness, ear pain, ringing in the ears, wax buildup, nasal congestion or bloody nose. Respiratory: Positive cough and shortness of breath. Denies difficulty breathing.  Cardiovascular: Denies chest pain, chest tightness, palpitations or swelling in the hands  or feet.   No other specific complaints in a complete review of systems (except as listed in HPI above).  Objective:  BP 134/84 mmHg  Pulse 81  Temp(Src) 97.8 F (36.6 C) (Oral)  Wt 199 lb (90.266 kg)  SpO2 97%   General: Appears her stated age,  in NAD. HEENT: Head: normal shape and size, maxillary sinus tenderness noted; Eyes: sclera white, no icterus, conjunctiva pink, PERRLA and EOMs intact; Ears: Tm's gray and intact, normal light reflex; Nose: mucosa boggy and moist, septum midline; Throat/Mouth: + PND. Teeth present, mucosa erythematous and moist, no exudate noted, no lesions or ulcerations noted.  Neck: No adenopathy noted. Cardiovascular: Normal rate and rhythm. S1,S2 noted.  No murmur, rubs or gallops noted.  Pulmonary/Chest: Normal effort and positive vesicular breath sounds. No respiratory distress. No wheezes, rales or ronchi noted.      Assessment & Plan:   Acute bacterial sinusitis  Can use a Neti Pot which can be purchased from your local drug store. Flonase 2 sprays each nostril for 3 days and then as needed. Doxycyclline BID for 10 days Stop your daily prednisone and start taking the Prednisone Taper I have sent in for you RX for Hycodan cough syrup  RTC as needed or if symptoms persist.

## 2015-07-29 ENCOUNTER — Ambulatory Visit (INDEPENDENT_AMBULATORY_CARE_PROVIDER_SITE_OTHER): Payer: Medicare Other | Admitting: Internal Medicine

## 2015-07-29 ENCOUNTER — Encounter: Payer: Self-pay | Admitting: Internal Medicine

## 2015-07-29 ENCOUNTER — Ambulatory Visit (INDEPENDENT_AMBULATORY_CARE_PROVIDER_SITE_OTHER)
Admission: RE | Admit: 2015-07-29 | Discharge: 2015-07-29 | Disposition: A | Discharge status: Home / Self Care | Payer: Medicare Other | Source: Ambulatory Visit | Attending: Internal Medicine | Admitting: Internal Medicine

## 2015-07-29 VITALS — BP 136/90 | HR 67 | Temp 97.7°F

## 2015-07-29 DIAGNOSIS — R05 Cough: Secondary | ICD-10-CM | POA: Diagnosis not present

## 2015-07-29 DIAGNOSIS — R0602 Shortness of breath: Secondary | ICD-10-CM | POA: Diagnosis not present

## 2015-07-29 DIAGNOSIS — J449 Chronic obstructive pulmonary disease, unspecified: Secondary | ICD-10-CM

## 2015-07-29 MED ORDER — HYDROCODONE-HOMATROPINE 5-1.5 MG/5ML PO SYRP: 5.0000 mL | ORAL_SOLUTION | Freq: Three times a day (TID) | ORAL | Status: DC | PRN

## 2015-07-29 NOTE — Progress Notes (Signed)
HPI  Pt presents to the clinic today with c/o cough and chest congestion. This started last week. The cough is nonproductive. Her cough seems worse at night. She has been more short of breath than usual. She denies fever, chills or body aches. She has been using her Albuterol and taking her daily Prednisone. She does have a history of COPD and asthma. She has seen Dr. Melvyn Novas at [facility]. She reports that he is too far away and she would like to be referred to another pulmonologist. She does not smoke.  Review of Systems      Past Medical History  Diagnosis Date  . Diabetes   . Asthma   . Thyroid disease   . COPD (chronic obstructive pulmonary disease)   . Arthritis     Family History  Problem Relation Age of Onset  . Arthritis Father   . Diabetes Sister   . Diabetes Daughter   . Cancer Neg Hx   . Heart disease Neg Hx   . Stroke Neg Hx     Social History   Social History  . Marital Status: Unknown    Spouse Name: N/A  . Number of Children: N/A  . Years of Education: N/A   Occupational History  . Retired    Social History Main Topics  . Smoking status: Former Smoker -- 1.50 packs/day for 30 years    Types: Cigarettes    Quit date: 11/15/1987  . Smokeless tobacco: Never Used  . Alcohol Use: No  . Drug Use: No  . Sexual Activity: Not on file   Other Topics Concern  . Not on file   Social History Narrative    No Known Allergies   Constitutional: Positive fatigue. Denies headache, fever or abrupt weight changes.  HEENT:  Denies eye redness, eye pain, pressure behind the eyes, facial pain, nasal congestion, ear pain, ringing in the ears, wax buildup, runny nose or sore throat. Respiratory: Positive cough and shortness of breath. Denies difficulty breathing.  Cardiovascular: Denies chest pain, chest tightness, palpitations or swelling in the hands or feet.   No other specific complaints in a complete review of systems (except as listed in HPI  above).  Objective:   BP 136/90 mmHg  Pulse 67  Temp(Src) 97.7 F (36.5 C) (Oral)  Wt   SpO2 98%  Wt Readings from Last 3 Encounters:  06/19/15 199 lb (90.266 kg)  05/21/15 198 lb (89.812 kg)  03/23/15 193 lb (87.544 kg)     General: Appears her stated age, well developed, well nourished in NAD. HEENT: Head: normal shape and size, no sinus tenderness noted; Eyes: sclera white, no icterus, conjunctiva pink; Ears: Tm's gray and intact, normal light reflex; Nose: mucosa boggy and moist, septum midline; Throat/Mouth: Teeth present, mucosa pink and moist, no exudate noted, no lesions or ulcerations noted.  Neck: No cervical lymphadenopathy.  Cardiovascular: Normal rate and rhythm. S1,S2 noted.  No murmur, rubs or gallops noted.  Pulmonary/Chest: Normal effort and positive vesicular breath sounds. No respiratory distress. No wheezes, rales or ronchi noted.      Assessment & Plan:   COPD:  Will obtain chest xray today No abx unless infiltrate noted on exam Continue Albuterol inhaler  Continue Prednisone as prescribed by pulmonology Rx for Hycodan cough syrup Referral placed for pulmonology in [place]  Will follow up after chest xray is back, RTC as needed or if symptoms persist.

## 2015-07-29 NOTE — Patient Instructions (Signed)
Cough, Adult  A cough is a reflex that helps clear your throat and airways. It can help heal the body or may be a reaction to an irritated airway. A cough may only last 2 or 3 weeks (acute) or may last more than 8 weeks (chronic).  CAUSES Acute cough:  Viral or bacterial infections. Chronic cough:  Infections.  Allergies.  Asthma.  Post-nasal drip.  Smoking.  Heartburn or acid reflux.  Some medicines.  Chronic lung problems (COPD).  Cancer. SYMPTOMS   Cough.  Fever.  Chest pain.  Increased breathing rate.  High-pitched whistling sound when breathing (wheezing).  Colored mucus that you cough up (sputum). TREATMENT   A bacterial cough may be treated with antibiotic medicine.  A viral cough must run its course and will not respond to antibiotics.  Your caregiver may recommend other treatments if you have a chronic cough. HOME CARE INSTRUCTIONS   Only take over-the-counter or prescription medicines for pain, discomfort, or fever as directed by your caregiver. Use cough suppressants only as directed by your caregiver.  Use a cold steam vaporizer or humidifier in your bedroom or home to help loosen secretions.  Sleep in a semi-upright position if your cough is worse at night.  Rest as needed.  Stop smoking if you smoke. SEEK IMMEDIATE MEDICAL CARE IF:   You have pus in your sputum.  Your cough starts to worsen.  You cannot control your cough with suppressants and are losing sleep.  You begin coughing up blood.  You have difficulty breathing.  You develop pain which is getting worse or is uncontrolled with medicine.  You have a fever. MAKE SURE YOU:   Understand these instructions.  Will watch your condition.  Will get help right away if you are not doing well or get worse. Document Released: 04/29/2011 Document Revised: 01/23/2012 Document Reviewed: 04/29/2011 ExitCare Patient Information 2015 [employer]. This information is not intended  to replace advice given to you by your health care provider. Make sure you discuss any questions you have with your health care provider.  

## 2015-07-29 NOTE — Progress Notes (Signed)
Pre visit review using our clinic review tool, if applicable. No additional management support is needed unless otherwise documented below in the visit note. 

## 2015-08-17 ENCOUNTER — Ambulatory Visit (INDEPENDENT_AMBULATORY_CARE_PROVIDER_SITE_OTHER): Payer: Medicare Other | Admitting: Pulmonary Disease

## 2015-08-17 ENCOUNTER — Encounter: Payer: Self-pay | Admitting: Pulmonary Disease

## 2015-08-17 VITALS — BP 132/84 | HR 78 | Ht 64.0 in | Wt 204.0 lb

## 2015-08-17 DIAGNOSIS — J449 Chronic obstructive pulmonary disease, unspecified: Secondary | ICD-10-CM | POA: Diagnosis not present

## 2015-08-17 DIAGNOSIS — R635 Abnormal weight gain: Secondary | ICD-10-CM | POA: Diagnosis not present

## 2015-08-17 DIAGNOSIS — R0600 Dyspnea, unspecified: Secondary | ICD-10-CM

## 2015-08-17 NOTE — Patient Instructions (Signed)
Continue Breo and albuterol inhaler as previously Change prednisone to 5 mg every other day Follow up in 3 months - goal 10 pound weight loss between now and then

## 2015-08-18 NOTE — Progress Notes (Signed)
Pt profile: 58 F recently moved from [place]. Initially seen by Dr Melvyn Novas. Diagnosed with COPD in 2011. Graded as Gold I by Dr Melvyn Novas. Has been maintained on prednisone for a long time without a clear indication   Subj: Last seen by Dr Melvyn Novas 11/16 and plan was to taper off prednisone. This was not done and she remains on 5 mg daily. She has recent 10 pound weight gain with worsening DOE. She particularly notes SOB after stooping to tie shoes or pick something up. She participates in water aerobics. She has been tried on Advair in past which she states did not help much. She is presently on Breo which she believes is modestly beneficial. She uses albuterol approx 2 times per wk. Her symptoms are worse in hot, humid weather  Obj: Filed Vitals:   08/17/15 0938  BP: 132/84  Pulse: 78  Height: 5\' 4"  (1.626 m)  Weight: 204 lb (92.534 kg)  SpO2: 100%     Gen: NAD HEENT: WNL Neck: No JVD Chest: full BS, no wheezes Cardiac: Reg, no M MIW:OEHOZ, soft, NT, +BS Ext: No C/C//E  BMET    Component Value Date/Time   NA 136 03/23/2015 1052   K 4.1 03/23/2015 1052   CL 99 03/23/2015 1052   CO2 31 03/23/2015 1052   GLUCOSE 121* 03/23/2015 1052   BUN 30* 03/23/2015 1052   CREATININE 0.78 03/23/2015 1052   CALCIUM 9.9 03/23/2015 1052    CBC    Component Value Date/Time   WBC 11.3* 03/23/2015 1052   RBC 4.93 03/23/2015 1052   HGB 14.8 03/23/2015 1052   HCT 44.0 03/23/2015 1052   PLT 337.0 03/23/2015 1052   MCV 89.3 03/23/2015 1052   MCHC 33.6 03/23/2015 1052   RDW 14.7 03/23/2015 1052    CXR: NACPD   IMPRESSION: 1) Mild COPD, Gold's I 2) Obesity with recent wt gain 3) Chronic prednisone therapy 4) Dyspnea - likely due to obesity > COPD   PLAN/RECS:  Continue Breo and albuterol inhaler as previously Change prednisone to 5 mg every other day Follow up in 3 months - goal 10 pound weight loss between now and then   Merton Border, MD PCCM service Mobile [phone] Pager  [phone]

## 2015-08-20 ENCOUNTER — Encounter: Payer: Self-pay | Admitting: Internal Medicine

## 2015-08-20 ENCOUNTER — Ambulatory Visit (INDEPENDENT_AMBULATORY_CARE_PROVIDER_SITE_OTHER): Payer: Medicare Other | Admitting: Internal Medicine

## 2015-08-20 VITALS — BP 130/78 | HR 89 | Temp 97.7°F | Wt 201.0 lb

## 2015-08-20 DIAGNOSIS — Z23 Encounter for immunization: Secondary | ICD-10-CM

## 2015-08-20 DIAGNOSIS — Z760 Encounter for issue of repeat prescription: Secondary | ICD-10-CM | POA: Diagnosis not present

## 2015-08-20 DIAGNOSIS — B372 Candidiasis of skin and nail: Secondary | ICD-10-CM | POA: Diagnosis not present

## 2015-08-20 DIAGNOSIS — N3281 Overactive bladder: Secondary | ICD-10-CM

## 2015-08-20 MED ORDER — FLUTICASONE FUROATE-VILANTEROL 100-25 MCG/INH IN AEPB: 1.0000 | INHALATION_SPRAY | Freq: Every morning | RESPIRATORY_TRACT | Status: DC

## 2015-08-20 MED ORDER — ALBUTEROL SULFATE HFA 108 (90 BASE) MCG/ACT IN AERS: 1.0000 | INHALATION_SPRAY | Freq: Four times a day (QID) | RESPIRATORY_TRACT | Status: DC | PRN

## 2015-08-20 MED ORDER — MELOXICAM 15 MG PO TABS: 15.0000 mg | ORAL_TABLET | ORAL | Status: DC

## 2015-08-20 MED ORDER — KETOCONAZOLE 2 % EX CREA: 1.0000 "application " | TOPICAL_CREAM | Freq: Every day | CUTANEOUS | Status: DC

## 2015-08-20 MED ORDER — OXYBUTYNIN CHLORIDE ER 5 MG PO TB24: 5.0000 mg | ORAL_TABLET | Freq: Every day | ORAL | Status: DC

## 2015-08-20 NOTE — Progress Notes (Signed)
Subjective:    Patient ID: Megan Bradshaw, female    DOB: 05-21-1930, 81 y.o.   MRN: [id number]  HPI Megan Bradshaw is a 81 year old female who presents today with chief complaint of rash under her breasts for one week.  It is very itchy and she has used calamine lotion without relief. She has not recently changed soap or detergent.    She also is complaining of leaking urine and not being able to get to the bathroom fast enough before having an accident. She does not have symptoms of stress incontinence and can control her bladder when she coughs or sneezes.  No pain or burning with urination.       Review of Systems  Constitutional: Negative for fever, chills and fatigue.  HENT: Negative for congestion, postnasal drip, rhinorrhea and sore throat.   Respiratory: Negative for cough, shortness of breath and wheezing.   Cardiovascular: Negative for chest pain, palpitations and leg swelling.  Gastrointestinal: Negative for abdominal pain, diarrhea and constipation.  Genitourinary: Positive for urgency and frequency. Negative for dysuria.  Neurological: Negative for dizziness and light-headedness.  Psychiatric/Behavioral: The patient is not nervous/anxious.    Family History  Problem Relation Age of Onset  . Arthritis Father   . Diabetes Sister   . Diabetes Daughter   . Cancer Neg Hx   . Heart disease Neg Hx   . Stroke Neg Hx    Current Outpatient Prescriptions on File Prior to Visit  Medication Sig Dispense Refill  . albuterol (VENTOLIN HFA) 108 (90 BASE) MCG/ACT inhaler Inhale 1-2 puffs into the lungs every 6 (six) hours as needed for wheezing or shortness of breath.    . cetirizine (ZYRTEC) 10 MG tablet Take 10 mg by mouth daily.    . Cholecalciferol (VITAMIN D PO) Take 1 tablet by mouth daily.    . Fluticasone Furoate-Vilanterol (BREO ELLIPTA) 100-25 MCG/INH AEPB Inhale 1 puff into the lungs every morning. 180 each 2  . glipiZIDE (GLUCOTROL XL) 5 MG 24 hr tablet Take 1 tablet (5  mg total) by mouth daily with breakfast. 90 tablet 1  . levothyroxine (SYNTHROID, LEVOTHROID) 75 MCG tablet Take 1 tablet (75 mcg total) by mouth daily before breakfast. 90 tablet 1  . metFORMIN (GLUCOPHAGE) 500 MG tablet Take 1 tablet (500 mg total) by mouth 2 (two) times daily with a meal. 180 tablet 1  . Multiple Vitamin (MULTIVITAMIN) capsule Take 1 capsule by mouth daily.    . predniSONE (DELTASONE) 5 MG tablet Take 1 tablet (5 mg total) by mouth daily. (Patient taking differently: Take 5 mg by mouth every other day. ) 90 tablet 1   No current facility-administered medications on file prior to visit.        Objective:   Physical Exam  Constitutional: She is oriented to person, place, and time. She appears well-developed.  HENT:  Head: Normocephalic and atraumatic.  Mouth/Throat: Oropharynx is clear and moist. No oropharyngeal exudate.  Eyes: Conjunctivae are normal. Pupils are equal, round, and reactive to light.  Neck: Normal range of motion. Neck supple.  Cardiovascular: Normal rate, regular rhythm, normal heart sounds and intact distal pulses.   No murmur heard. Pulmonary/Chest: Effort normal and breath sounds normal. She has no wheezes.  Abdominal: Soft. Bowel sounds are normal. There is no tenderness.  Musculoskeletal: Normal range of motion.  Lymphadenopathy:    She has no cervical adenopathy.  Neurological: She is alert and oriented to person, place, and time.  Skin: Skin  is warm and dry.  Psychiatric: She has a normal mood and affect.    BP 130/78 mmHg  Pulse 89  Temp(Src) 97.7 F (36.5 C) (Oral)  Wt 201 lb (91.173 kg)  SpO2 97%       Assessment & Plan:  1. Urge incontinence Start oxybutynin XL 5mg  daily.  Follow up in 3 months.   2. Yeast infection under breasts Rx for Nystatin cream.  Call office if no improvement in 5 days.

## 2015-08-20 NOTE — Patient Instructions (Signed)
Intertrigo Intertrigo is a skin condition that occurs in between folds of skin in places on the body that rub together a lot and do not get much ventilation. It is caused by heat, moisture, friction, sweat retention, and lack of air circulation, which produces red, irritated patches and, sometimes, scaling or drainage. People who have diabetes, who are obese, or who have treatment with antibiotics are at increased risk for intertrigo. The most common sites for intertrigo to occur include:  The groin.  The breasts.  The armpits.  Folds of abdominal skin.  Webbed spaces between the fingers or toes. Intertrigo may be aggravated by:  Sweat.  Feces.  Yeast or bacteria that are present near skin folds.  Urine.  Vaginal discharge. HOME CARE INSTRUCTIONS  The following steps can be taken to reduce friction and keep the affected area cool and dry:  Expose skin folds to the air.  Keep deep skin folds separated with cotton or linen cloth. Avoid tight fitting clothing that could cause chafing.  Wear open-toed shoes or sandals to help reduce moisture between the toes.  Apply absorbent powders to affected areas as directed by your caregiver.  Apply over-the-counter barrier pastes, such as zinc oxide, as directed by your caregiver.  If you develop a fungal infection in the affected area, your caregiver may have you use antifungal creams. SEEK MEDICAL CARE IF:   The rash is not improving after 1 week of treatment.  The rash is getting worse (more red, more swollen, more painful, or spreading).  You have a fever or chills. MAKE SURE YOU:   Understand these instructions.  Will watch your condition.  Will get help right away if you are not doing well or get worse.   This information is not intended to replace advice given to you by your health care provider. Make sure you discuss any questions you have with your health care provider.   Document Released: 10/31/2005 Document  Revised: 01/23/2012 Document Reviewed: 05/04/2015 Elsevier Interactive Patient Education 2016 [employer]  

## 2015-08-20 NOTE — Progress Notes (Signed)
Subjective:    Patient ID: Megan Bradshaw, female    DOB: Mar 05, 1930, 81 y.o.   MRN: [id number]  HPI  Pt presents to the clinic today with c/o a rash under her breast. She noticed this 1 week ago. The rash is red and itchy. It is not painful. She has never had a rash like this before. She has put calamine lotion on it without any relief.  She would also like to get her flu and pneumonia vaccine today. She reports she usually gets the pneumovax every 5 years, per recommendation from her pulmonologist. She has never had the Prevnar vaccine.  She is also concerned about unrinary incontinence. She reports she is having trouble holding her urine. It is more than just dribbling, it is like a constant flow. She denies pelvic pain, dysuria, or frequency.  She does request medication refills today of Breo, Albuterol and Meloxicam.  Review of Systems      Past Medical History  Diagnosis Date  . Diabetes ([place])   . Asthma   . Thyroid disease   . COPD (chronic obstructive pulmonary disease) ([place])   . Arthritis     Current Outpatient Prescriptions  Medication Sig Dispense Refill  . albuterol (VENTOLIN HFA) 108 (90 BASE) MCG/ACT inhaler Inhale 1-2 puffs into the lungs every 6 (six) hours as needed for wheezing or shortness of breath.    . cetirizine (ZYRTEC) 10 MG tablet Take 10 mg by mouth daily.    . Cholecalciferol (VITAMIN D PO) Take 1 tablet by mouth daily.    . Fluticasone Furoate-Vilanterol (BREO ELLIPTA) 100-25 MCG/INH AEPB Inhale 1 puff into the lungs every morning. 180 each 2  . glipiZIDE (GLUCOTROL XL) 5 MG 24 hr tablet Take 1 tablet (5 mg total) by mouth daily with breakfast. 90 tablet 1  . levothyroxine (SYNTHROID, LEVOTHROID) 75 MCG tablet Take 1 tablet (75 mcg total) by mouth daily before breakfast. 90 tablet 1  . metFORMIN (GLUCOPHAGE) 500 MG tablet Take 1 tablet (500 mg total) by mouth 2 (two) times daily with a meal. 180 tablet 1  . Multiple Vitamin (MULTIVITAMIN) capsule  Take 1 capsule by mouth daily.    . predniSONE (DELTASONE) 5 MG tablet Take 1 tablet (5 mg total) by mouth daily. 90 tablet 1   No current facility-administered medications for this visit.    No Known Allergies  Family History  Problem Relation Age of Onset  . Arthritis Father   . Diabetes Sister   . Diabetes Daughter   . Cancer Neg Hx   . Heart disease Neg Hx   . Stroke Neg Hx     Social History   Social History  . Marital Status: Unknown    Spouse Name: N/A  . Number of Children: N/A  . Years of Education: N/A   Occupational History  . Retired    Social History Main Topics  . Smoking status: Former Smoker -- 1.50 packs/day for 30 years    Types: Cigarettes    Quit date: 11/15/1987  . Smokeless tobacco: Never Used  . Alcohol Use: No  . Drug Use: No  . Sexual Activity: Not on file   Other Topics Concern  . Not on file   Social History Narrative     Constitutional: Denies fever, malaise, fatigue, headache or abrupt weight changes.  Respiratory: Denies difficulty breathing, shortness of breath, cough or sputum production.   Cardiovascular: Denies chest pain, chest tightness, palpitations or swelling in the hands or feet.  GU: Pt reports urinary incontinence. Denies urgency, frequency, pain with urination, burning sensation, blood in urine, odor or discharge. Skin: Pt reports rash under breast. Denies ulcercations.   No other specific complaints in a complete review of systems (except as listed in HPI above).  Objective:   Physical Exam   BP 130/78 mmHg  Pulse 89  Temp(Src) 97.7 F (36.5 C) (Oral)  Wt 201 lb (91.173 kg)  SpO2 97% Wt Readings from Last 3 Encounters:  08/20/15 201 lb (91.173 kg)  08/17/15 204 lb (92.534 kg)  06/19/15 199 lb (90.266 kg)    General: Appears her stated age, obese in NAD. Skin: Yeasty rash noted under bilateral breast. Abdomen: Soft and nontender. Normal bowel sounds.  Neurological: Alert and oriented.     BMET      Component Value Date/Time   NA 136 03/23/2015 1052   K 4.1 03/23/2015 1052   CL 99 03/23/2015 1052   CO2 31 03/23/2015 1052   GLUCOSE 121* 03/23/2015 1052   BUN 30* 03/23/2015 1052   CREATININE 0.78 03/23/2015 1052   CALCIUM 9.9 03/23/2015 1052    Lipid Panel     Component Value Date/Time   CHOL 178 03/23/2015 1052   TRIG 146.0 03/23/2015 1052   HDL 62.10 03/23/2015 1052   CHOLHDL 3 03/23/2015 1052   VLDL 29.2 03/23/2015 1052   LDLCALC 87 03/23/2015 1052    CBC    Component Value Date/Time   WBC 11.3* 03/23/2015 1052   RBC 4.93 03/23/2015 1052   HGB 14.8 03/23/2015 1052   HCT 44.0 03/23/2015 1052   PLT 337.0 03/23/2015 1052   MCV 89.3 03/23/2015 1052   MCHC 33.6 03/23/2015 1052   RDW 14.7 03/23/2015 1052    Hgb A1C Lab Results  Component Value Date   HGBA1C 7.5* 03/23/2015        Assessment & Plan:   Candida Intertrigo:  eRx for Nystatin cream to affected area until resolved Discussed keeping area dry Pat dry after bathing  Need for influenza vaccine:  Flu shot today  Need for pneumococcal vaccine:  Prevnar today  Overactive Bladder:  Discussed doing a pelvic exam to assess for cystocele She declines this at this time because she is not interested in any surgery for this eRx for Ditropan 5 mg dialy  Medications refills:  Albuterol Breo  Meloxicam  RTC when your are due for your routine followup

## 2015-08-20 NOTE — Addendum Note (Signed)
Addended by: Lurlean Nanny on: 08/20/2015 04:48 PM   Modules accepted: Orders

## 2015-08-20 NOTE — Progress Notes (Signed)
Pre visit review using our clinic review tool, if applicable. No additional management support is needed unless otherwise documented below in the visit note. 

## 2015-08-27 ENCOUNTER — Other Ambulatory Visit: Payer: Self-pay | Admitting: Internal Medicine

## 2015-10-05 ENCOUNTER — Encounter: Payer: Self-pay | Admitting: Internal Medicine

## 2015-10-05 ENCOUNTER — Ambulatory Visit (INDEPENDENT_AMBULATORY_CARE_PROVIDER_SITE_OTHER): Payer: Medicare Other | Admitting: Internal Medicine

## 2015-10-05 VITALS — BP 118/78 | HR 90 | Temp 97.5°F | Resp 20

## 2015-10-05 DIAGNOSIS — J441 Chronic obstructive pulmonary disease with (acute) exacerbation: Secondary | ICD-10-CM

## 2015-10-05 MED ORDER — PREDNISONE 10 MG PO TABS: ORAL_TABLET | ORAL | Status: DC

## 2015-10-05 MED ORDER — HYDROCODONE-HOMATROPINE 5-1.5 MG/5ML PO SYRP: 5.0000 mL | ORAL_SOLUTION | Freq: Three times a day (TID) | ORAL | Status: DC | PRN

## 2015-10-05 NOTE — Patient Instructions (Signed)

## 2015-10-05 NOTE — Progress Notes (Signed)
HPI  Pt presents to the clinic today with c/o cough and shortness of breath. This started 1 week ago. The cough is nonproductive. She also has some nasal congestion. She denies fever, chills or body aches. She has taken Mucinex without any relief. She continues to take Breo and Albuterol for her COPD. She follows with Dr. Melvyn Novas.  Review of Systems      Past Medical History  Diagnosis Date  . Diabetes ([place])   . Asthma   . Thyroid disease   . COPD (chronic obstructive pulmonary disease) ([place])   . Arthritis     Family History  Problem Relation Age of Onset  . Arthritis Father   . Diabetes Sister   . Diabetes Daughter   . Cancer Neg Hx   . Heart disease Neg Hx   . Stroke Neg Hx     Social History   Social History  . Marital Status: Unknown    Spouse Name: N/A  . Number of Children: N/A  . Years of Education: N/A   Occupational History  . Retired    Social History Main Topics  . Smoking status: Former Smoker -- 1.50 packs/day for 30 years    Types: Cigarettes    Quit date: 11/15/1987  . Smokeless tobacco: Never Used  . Alcohol Use: No  . Drug Use: No  . Sexual Activity: Not on file   Other Topics Concern  . Not on file   Social History Narrative    No Known Allergies   Constitutional:  Denies headache, fatigue, fever or abrupt weight changes.  HEENT:  Positive nasal congestion. Denies eye redness, eye pain, pressure behind the eyes, facial pain, ear pain, ringing in the ears, wax buildup, runny nose or sore throat. Respiratory: Positive cough and shortness of breath. Denies difficulty breathing.  Cardiovascular: Denies chest pain, chest tightness, palpitations or swelling in the hands or feet.   No other specific complaints in a complete review of systems (except as listed in HPI above).  Objective:  BP 118/78 mmHg  Pulse 90  Temp(Src) 97.5 F (36.4 C)  Resp 20  Wt   SpO2 97%  Wt Readings from Last 3 Encounters:  08/20/15 201 lb (91.173 kg)   08/17/15 204 lb (92.534 kg)  06/19/15 199 lb (90.266 kg)     General: Appears her stated age, obese in NAD. HEENT: Head: normal shape and size, no sinus tenderness noted; Eyes: sclera white, no icterus, conjunctiva pink; Ears: Tm's gray and intact, normal light reflex; Nose: mucosa pink and moist, septum midline; Throat/Mouth: + PND. Teeth present, mucosa pink and moist, no exudate noted, no lesions or ulcerations noted.  Neck: No cervical lymphadenopathy.  Cardiovascular: Normal rate and rhythm. S1,S2 noted.  No murmur, rubs or gallops noted.  Pulmonary/Chest: Normal effort and diminished vesicular breath sounds with bilateral expiratory wheezing noted. No respiratory distress. No rales or ronchi noted.      Assessment & Plan:   COPD exacerbation:  Continue Breo and Albuterol eRx for Pred Taper, hold QOD Prednisone Refilled Hycodan cough syrup, RX provided Return precautions given  RTC as needed or if symptoms persist.

## 2015-10-12 ENCOUNTER — Ambulatory Visit (INDEPENDENT_AMBULATORY_CARE_PROVIDER_SITE_OTHER): Payer: Medicare Other | Admitting: Internal Medicine

## 2015-10-12 ENCOUNTER — Encounter: Payer: Self-pay | Admitting: Internal Medicine

## 2015-10-12 VITALS — BP 136/88 | HR 76 | Temp 98.1°F

## 2015-10-12 DIAGNOSIS — J441 Chronic obstructive pulmonary disease with (acute) exacerbation: Secondary | ICD-10-CM | POA: Diagnosis not present

## 2015-10-12 DIAGNOSIS — R609 Edema, unspecified: Secondary | ICD-10-CM | POA: Diagnosis not present

## 2015-10-12 MED ORDER — HYDROCHLOROTHIAZIDE 25 MG PO TABS: 25.0000 mg | ORAL_TABLET | Freq: Every day | ORAL | Status: DC | PRN

## 2015-10-12 MED ORDER — DOXYCYCLINE HYCLATE 100 MG PO TABS: 100.0000 mg | ORAL_TABLET | Freq: Two times a day (BID) | ORAL | Status: DC

## 2015-10-12 NOTE — Patient Instructions (Signed)

## 2015-10-12 NOTE — Progress Notes (Signed)
HPI  Pt presents to the clinic today to f/u COPD exacerbation. She was seen 11/21 for the same.  She was started on Prednisone and Hycodan for cough. She took all medications as prescribed. She c/o continued cough and shortness of breath.The cough is nonproductive. She also has some nasal congestion. She denies fever, chills or body aches. She has been using her nebulizer 4 x day. She continues to take Breo and Albuterol for her COPD. She follows with Dr. Melvyn Novas.  She is also planning to go see her sister in [place] who is terminally ill. She feels like she can not travel because her legs swell up so bad. When they swell, she has trouble ambulating. She has never taken medication for this. She is not able to wear compression hose because she can not get them on. She wants to know what she can do so that she can go visit her sister. Review of Systems      Past Medical History  Diagnosis Date  . Diabetes ([place])   . Asthma   . Thyroid disease   . COPD (chronic obstructive pulmonary disease) ([place])   . Arthritis     Family History  Problem Relation Age of Onset  . Arthritis Father   . Diabetes Sister   . Diabetes Daughter   . Cancer Neg Hx   . Heart disease Neg Hx   . Stroke Neg Hx     Social History   Social History  . Marital Status: Unknown    Spouse Name: N/A  . Number of Children: N/A  . Years of Education: N/A   Occupational History  . Retired    Social History Main Topics  . Smoking status: Former Smoker -- 1.50 packs/day for 30 years    Types: Cigarettes    Quit date: 11/15/1987  . Smokeless tobacco: Never Used  . Alcohol Use: No  . Drug Use: No  . Sexual Activity: Not on file   Other Topics Concern  . Not on file   Social History Narrative    No Known Allergies   Constitutional:  Denies headache, fatigue, fever or abrupt weight changes.  HEENT:  Positive nasal congestion. Denies eye redness, eye pain, pressure behind the eyes, facial pain, ear pain, ringing in  the ears, wax buildup, runny nose or sore throat. Respiratory: Positive cough and shortness of breath. Denies difficulty breathing.  Cardiovascular: Pt reports swelling in her feet. Denies chest pain, chest tightness, palpitations or swelling in the hands.   No other specific complaints in a complete review of systems (except as listed in HPI above).  Objective:  BP 136/88 mmHg  Pulse 76  Temp(Src) 98.1 F (36.7 C) (Oral)  Wt   SpO2 98%   Wt Readings from Last 3 Encounters:  08/20/15 201 lb (91.173 kg)  08/17/15 204 lb (92.534 kg)  06/19/15 199 lb (90.266 kg)     General: Appears her stated age, obese in NAD. HEENT: Head: normal shape and size, no sinus tenderness noted; Eyes: sclera white, no icterus, conjunctiva pink; Ears: Tm's gray and intact, normal light reflex; Nose: mucosa pink and moist, septum midline; Throat/Mouth: + PND. Teeth present, mucosa pink and moist, no exudate noted, no lesions or ulcerations noted.  Neck: No cervical lymphadenopathy.  Cardiovascular: Normal rate and rhythm. S1,S2 noted.  No murmur, rubs or gallops noted. Trace BLE edema. Pulmonary/Chest: Normal effort and diminished vesicular breath sounds with bilateral expiratory wheezing noted. No respiratory distress. No rales or ronchi noted.  Assessment & Plan:   COPD exacerbation:  Continue Breo and Albuterol eRx for Doxycycline 100 mg BID Continue Hycodan cough syrup Return precautions given  Edema:  eRx for HCTZ 25 mg daily prn Keep legs elevated  RTC as needed or if symptoms persist.

## 2015-10-12 NOTE — Progress Notes (Signed)
Pre visit review using our clinic review tool, if applicable. No additional management support is needed unless otherwise documented below in the visit note. 

## 2015-10-19 ENCOUNTER — Ambulatory Visit (INDEPENDENT_AMBULATORY_CARE_PROVIDER_SITE_OTHER)
Admission: RE | Admit: 2015-10-19 | Discharge: 2015-10-19 | Disposition: A | Discharge status: Home / Self Care | Payer: Medicare Other | Source: Ambulatory Visit | Attending: Internal Medicine | Admitting: Internal Medicine

## 2015-10-19 ENCOUNTER — Ambulatory Visit (INDEPENDENT_AMBULATORY_CARE_PROVIDER_SITE_OTHER): Payer: Medicare Other | Admitting: Internal Medicine

## 2015-10-19 ENCOUNTER — Encounter: Payer: Self-pay | Admitting: Internal Medicine

## 2015-10-19 VITALS — BP 150/96 | HR 97 | Temp 97.9°F

## 2015-10-19 DIAGNOSIS — W19XXXA Unspecified fall, initial encounter: Secondary | ICD-10-CM

## 2015-10-19 DIAGNOSIS — R0781 Pleurodynia: Secondary | ICD-10-CM | POA: Diagnosis not present

## 2015-10-19 DIAGNOSIS — S299XXA Unspecified injury of thorax, initial encounter: Secondary | ICD-10-CM | POA: Diagnosis not present

## 2015-10-19 DIAGNOSIS — M546 Pain in thoracic spine: Secondary | ICD-10-CM

## 2015-10-19 DIAGNOSIS — S199XXA Unspecified injury of neck, initial encounter: Secondary | ICD-10-CM | POA: Diagnosis not present

## 2015-10-19 DIAGNOSIS — M542 Cervicalgia: Secondary | ICD-10-CM | POA: Diagnosis not present

## 2015-10-19 DIAGNOSIS — R079 Chest pain, unspecified: Secondary | ICD-10-CM | POA: Diagnosis not present

## 2015-10-19 MED ORDER — TRAMADOL HCL 50 MG PO TABS: 50.0000 mg | ORAL_TABLET | Freq: Three times a day (TID) | ORAL | Status: DC | PRN

## 2015-10-19 NOTE — Progress Notes (Signed)
Subjective:    Patient ID: Megan Bradshaw, female    DOB: 04-May-1930, 81 y.o.   MRN: [id number]  HPI  Pt presents to the clinic today with c/o neck pain, right side back pain and right side rib pain. This started 4 days ago after a fall where she was trying to walk up bleachers. She has not noticed any bruising. She describes the area as soreness. She denies difficulty breathing. She has been taking Meloxicam and using a heating pad with minimal relief.   Review of Systems      Past Medical History  Diagnosis Date  . Diabetes ([place])   . Asthma   . Thyroid disease   . COPD (chronic obstructive pulmonary disease) ([place])   . Arthritis     Current Outpatient Prescriptions  Medication Sig Dispense Refill  . albuterol (VENTOLIN HFA) 108 (90 BASE) MCG/ACT inhaler Inhale 1-2 puffs into the lungs every 6 (six) hours as needed for wheezing or shortness of breath. 1 Inhaler 2  . cetirizine (ZYRTEC) 10 MG tablet Take 10 mg by mouth daily.    . Cholecalciferol (VITAMIN D PO) Take 1 tablet by mouth daily.    Marland Kitchen doxycycline (VIBRA-TABS) 100 MG tablet Take 1 tablet (100 mg total) by mouth 2 (two) times daily. 20 tablet 0  . Fluticasone Furoate-Vilanterol (BREO ELLIPTA) 100-25 MCG/INH AEPB Inhale 1 puff into the lungs every morning. 180 each 2  . glipiZIDE (GLUCOTROL XL) 5 MG 24 hr tablet Take 1 tablet (5 mg total) by mouth daily with breakfast. 90 tablet 1  . hydrochlorothiazide (HYDRODIURIL) 25 MG tablet Take 1 tablet (25 mg total) by mouth daily as needed. 30 tablet 1  . HYDROcodone-homatropine (HYCODAN) 5-1.5 MG/5ML syrup Take 5 mLs by mouth every 8 (eight) hours as needed for cough. 180 mL 0  . ketoconazole (NIZORAL) 2 % cream APPLY EXTERNALLY TO THE AFFECTED AREA DAILY 15 g 0  . levothyroxine (SYNTHROID, LEVOTHROID) 75 MCG tablet Take 1 tablet (75 mcg total) by mouth daily before breakfast. 90 tablet 1  . meloxicam (MOBIC) 15 MG tablet Take 1 tablet (15 mg total) by mouth every other day. 15  tablet 2  . metFORMIN (GLUCOPHAGE) 500 MG tablet Take 1 tablet (500 mg total) by mouth 2 (two) times daily with a meal. 180 tablet 1  . Multiple Vitamin (MULTIVITAMIN) capsule Take 1 capsule by mouth daily.    Marland Kitchen oxybutynin (DITROPAN-XL) 5 MG 24 hr tablet Take 1 tablet (5 mg total) by mouth at bedtime. 30 tablet 2  . predniSONE (DELTASONE) 10 MG tablet Take 6 tabs day 1, 5 tabs day 2, 4 tabs day 3, 3 tabs day 4, 2 tabs day 5, 1 tab day 6 21 tablet 0  . predniSONE (DELTASONE) 5 MG tablet Take 1 tablet (5 mg total) by mouth daily. (Patient taking differently: Take 5 mg by mouth every other day. ) 90 tablet 1  . traMADol (ULTRAM) 50 MG tablet Take 1 tablet (50 mg total) by mouth every 8 (eight) hours as needed. 30 tablet 0   No current facility-administered medications for this visit.    No Known Allergies  Family History  Problem Relation Age of Onset  . Arthritis Father   . Diabetes Sister   . Diabetes Daughter   . Cancer Neg Hx   . Heart disease Neg Hx   . Stroke Neg Hx     Social History   Social History  . Marital Status: Unknown    Spouse  Name: N/A  . Number of Children: N/A  . Years of Education: N/A   Occupational History  . Retired    Social History Main Topics  . Smoking status: Former Smoker -- 1.50 packs/day for 30 years    Types: Cigarettes    Quit date: 11/15/1987  . Smokeless tobacco: Never Used  . Alcohol Use: No  . Drug Use: No  . Sexual Activity: Not on file   Other Topics Concern  . Not on file   Social History Narrative     Constitutional: Denies fever, malaise, fatigue, headache or abrupt weight changes.  Musculoskeletal: Pt reports neck pain, rib pain, and back pain. Denies  difficulty with gait, muscle pain or joint swelling.  Skin: Denies redness, rashes, lesions or ulcercations.  Neurological: Denies dizziness, difficulty with memory, difficulty with speech or problems with balance and coordination.   No other specific complaints in a  complete review of systems (except as listed in HPI above).  Objective:   Physical Exam   BP 150/96 mmHg  Pulse 97  Temp(Src) 97.9 F (36.6 C) (Oral)  Wt   SpO2 98% Wt Readings from Last 3 Encounters:  08/20/15 201 lb (91.173 kg)  08/17/15 204 lb (92.534 kg)  06/19/15 199 lb (90.266 kg)    General: Appears her stated age, obese in NAD. Musculoskeletal: Decreased flexion, extension and rotation of the cervical spine. No bony tenderness. Pain with palpation of the right side of the neck. She has muscle tension noted in the right trapezius. Normal internal and external rotation of the right shoulder. Pain with palpation of the right side of ribs. Neurological: Alert and oriented.    BMET    Component Value Date/Time   NA 136 03/23/2015 1052   K 4.1 03/23/2015 1052   CL 99 03/23/2015 1052   CO2 31 03/23/2015 1052   GLUCOSE 121* 03/23/2015 1052   BUN 30* 03/23/2015 1052   CREATININE 0.78 03/23/2015 1052   CALCIUM 9.9 03/23/2015 1052    Lipid Panel     Component Value Date/Time   CHOL 178 03/23/2015 1052   TRIG 146.0 03/23/2015 1052   HDL 62.10 03/23/2015 1052   CHOLHDL 3 03/23/2015 1052   VLDL 29.2 03/23/2015 1052   LDLCALC 87 03/23/2015 1052    CBC    Component Value Date/Time   WBC 11.3* 03/23/2015 1052   RBC 4.93 03/23/2015 1052   HGB 14.8 03/23/2015 1052   HCT 44.0 03/23/2015 1052   PLT 337.0 03/23/2015 1052   MCV 89.3 03/23/2015 1052   MCHC 33.6 03/23/2015 1052   RDW 14.7 03/23/2015 1052    Hgb A1C Lab Results  Component Value Date   HGBA1C 7.5* 03/23/2015        Assessment & Plan:   Neck pain, right side thoracic back pain, right rib pain s/p fall:  Likely MSK She would like xray of ribs and neck today Xray cervical spine and right side of ribs ordered Continue Meloxicam and heat RX for Tramadol for severe pain  Will follow up after xrays, RTC as needed

## 2015-10-19 NOTE — Patient Instructions (Signed)
Fall Prevention in the Home  Falls can cause injuries and can affect people from all age groups. There are many simple things that you can do to make your home safe and to help prevent falls. WHAT CAN I DO ON THE OUTSIDE OF MY HOME?  Regularly repair the edges of walkways and driveways and fix any cracks.  Remove high doorway thresholds.  Trim any shrubbery on the main path into your home.  Use bright outdoor lighting.  Clear walkways of debris and clutter, including tools and rocks.  Regularly check that handrails are securely fastened and in good repair. Both sides of any steps should have handrails.  Install guardrails along the edges of any raised decks or porches.  Have leaves, snow, and ice cleared regularly.  Use sand or salt on walkways during winter months.  In the garage, clean up any spills right away, including grease or oil spills. WHAT CAN I DO IN THE BATHROOM?  Use night lights.  Install grab bars by the toilet and in the tub and shower. Do not use towel bars as grab bars.  Use non-skid mats or decals on the floor of the tub or shower.  If you need to sit down while you are in the shower, use a plastic, non-slip stool..  Keep the floor dry. Immediately clean up any water that spills on the floor.  Remove soap buildup in the tub or shower on a regular basis.  Attach bath mats securely with double-sided non-slip rug tape.  Remove throw rugs and other tripping hazards from the floor. WHAT CAN I DO IN THE BEDROOM?  Use night lights.  Make sure that a bedside light is easy to reach.  Do not use oversized bedding that drapes onto the floor.  Have a firm chair that has side arms to use for getting dressed.  Remove throw rugs and other tripping hazards from the floor. WHAT CAN I DO IN THE KITCHEN?   Clean up any spills right away.  Avoid walking on wet floors.  Place frequently used items in easy-to-reach places.  If you need to reach for something  above you, use a sturdy step stool that has a grab bar.  Keep electrical cables out of the way.  Do not use floor polish or wax that makes floors slippery. If you have to use wax, make sure that it is non-skid floor wax.  Remove throw rugs and other tripping hazards from the floor. WHAT CAN I DO IN THE STAIRWAYS?  Do not leave any items on the stairs.  Make sure that there are handrails on both sides of the stairs. Fix handrails that are broken or loose. Make sure that handrails are as long as the stairways.  Check any carpeting to make sure that it is firmly attached to the stairs. Fix any carpet that is loose or worn.  Avoid having throw rugs at the top or bottom of stairways, or secure the rugs with carpet tape to prevent them from moving.  Make sure that you have a light switch at the top of the stairs and the bottom of the stairs. If you do not have them, have them installed. WHAT ARE SOME OTHER FALL PREVENTION TIPS?  Wear closed-toe shoes that fit well and support your feet. Wear shoes that have rubber soles or low heels.  When you use a stepladder, make sure that it is completely opened and that the sides are firmly locked. Have someone hold the ladder while you   are using it. Do not climb a closed stepladder.  Add color or contrast paint or tape to grab bars and handrails in your home. Place contrasting color strips on the first and last steps.  Use mobility aids as needed, such as canes, walkers, scooters, and crutches.  Turn on lights if it is dark. Replace any light bulbs that burn out.  Set up furniture so that there are clear paths. Keep the furniture in the same spot.  Fix any uneven floor surfaces.  Choose a carpet design that does not hide the edge of steps of a stairway.  Be aware of any and all pets.  Review your medicines with your healthcare provider. Some medicines can cause dizziness or changes in blood pressure, which increase your risk of falling. Talk  with your health care provider about other ways that you can decrease your risk of falls. This may include working with a physical therapist or trainer to improve your strength, balance, and endurance.   This information is not intended to replace advice given to you by your health care provider. Make sure you discuss any questions you have with your health care provider.   Document Released: 10/21/2002 Document Revised: 03/17/2015 Document Reviewed: 12/05/2014 [employer] Interactive Patient Education 2016 [employer]  

## 2015-10-19 NOTE — Progress Notes (Signed)
Pre visit review using our clinic review tool, if applicable. No additional management support is needed unless otherwise documented below in the visit note. 

## 2015-10-20 ENCOUNTER — Ambulatory Visit: Payer: Medicare Other | Admitting: Internal Medicine

## 2015-11-13 ENCOUNTER — Ambulatory Visit (INDEPENDENT_AMBULATORY_CARE_PROVIDER_SITE_OTHER): Payer: Medicare Other | Admitting: Internal Medicine

## 2015-11-13 ENCOUNTER — Encounter: Payer: Self-pay | Admitting: Internal Medicine

## 2015-11-13 VITALS — BP 140/94 | HR 110 | Temp 98.7°F

## 2015-11-13 DIAGNOSIS — J441 Chronic obstructive pulmonary disease with (acute) exacerbation: Secondary | ICD-10-CM | POA: Diagnosis not present

## 2015-11-13 MED ORDER — AMOXICILLIN-POT CLAVULANATE 875-125 MG PO TABS: 1.0000 | ORAL_TABLET | Freq: Two times a day (BID) | ORAL | Status: DC

## 2015-11-13 MED ORDER — HYDROCOD POLST-CPM POLST ER 10-8 MG/5ML PO SUER: 5.0000 mL | Freq: Every evening | ORAL | Status: DC | PRN

## 2015-11-13 MED ORDER — PREDNISONE 10 MG PO TABS: ORAL_TABLET | ORAL | Status: DC

## 2015-11-13 NOTE — Patient Instructions (Signed)
Chronic Obstructive Pulmonary Disease °Chronic obstructive pulmonary disease (COPD) is a common lung problem. In COPD, the flow of air from the lungs is limited. The way your lungs work will probably never return to normal, but there are things you can do to improve your lungs and make yourself feel better. Your doctor may treat your condition with: °· Medicines. °· Oxygen. °· Lung surgery. °· Changes to your diet. °· Rehabilitation. This may involve a team of specialists. °HOME CARE °· Take all medicines as told by your doctor. °· Avoid medicines or cough syrups that dry up your airway (such as antihistamines) and do not allow you to get rid of thick spit. You do not need to avoid them if told differently by your doctor. °· If you smoke, stop. Smoking makes the problem worse. °· Avoid being around things that make your breathing worse (like smoke, chemicals, and fumes). °· Use oxygen therapy and therapy to help improve your lungs (pulmonary rehabilitation) if told by your doctor. If you need home oxygen therapy, ask your doctor if you should buy a tool to measure your oxygen level (oximeter). °· Avoid people who have a sickness you can catch (contagious). °· Avoid going outside when it is very hot, cold, or humid. °· Eat healthy foods. Eat smaller meals more often. Rest before meals. °· Stay active, but remember to also rest. °· Make sure to get all the shots (vaccines) your doctor recommends. Ask your doctor if you need a pneumonia shot. °· Learn and use tips on how to relax. °· Learn and use tips on how to control your breathing as told by your doctor. Try: °¨ Breathing in (inhaling) through your nose for 1 second. Then, pucker your lips and breath out (exhale) through your lips for 2 seconds. °¨ Putting one hand on your belly (abdomen). Breathe in slowly through your nose for 1 second. Your hand on your belly should move out. Pucker your lips and breathe out slowly through your lips. Your hand on your belly  should move in as you breathe out. °· Learn and use controlled coughing to clear thick spit from your lungs. The steps are: °1. Lean your head a little forward. °2. Breathe in deeply. °3. Try to hold your breath for 3 seconds. °4. Keep your mouth slightly open while coughing 2 times. °5. Spit any thick spit out into a tissue. °6. Rest and do the steps again 1 or 2 times as needed. °GET HELP IF: °· You cough up more thick spit than usual. °· There is a change in the color or thickness of the spit. °· It is harder to breathe than usual. °· Your breathing is faster than usual. °GET HELP RIGHT AWAY IF: °· You have shortness of breath while resting. °· You have shortness of breath that stops you from: °¨ Being able to talk. °¨ Doing normal activities. °· You chest hurts for longer than 5 minutes. °· Your skin color is more blue than usual. °· Your pulse oximeter shows that you have low oxygen for longer than 5 minutes. °MAKE SURE YOU: °· Understand these instructions. °· Will watch your condition. °· Will get help right away if you are not doing well or get worse. °  °This information is not intended to replace advice given to you by your health care provider. Make sure you discuss any questions you have with your health care provider. °  °Document Released: 04/18/2008 Document Revised: 11/21/2014 Document Reviewed: 06/27/2013 °Elsevier Interactive Patient   Education ©2016 Elsevier Inc. ° °

## 2015-11-13 NOTE — Progress Notes (Signed)
Pre visit review using our clinic review tool, if applicable. No additional management support is needed unless otherwise documented below in the visit note. 

## 2015-11-13 NOTE — Progress Notes (Signed)
HPI  Pt presents to the clinic today with c/o cough and shortness of breath. This started 2-3 days ago. The cough is productive of yellow/green mucous. She does c/o chest tightness but denies chest pain. She denies fever but has had chills and body aches. She has tried Mucinex and Hycodan with minimal relief. She does have a history of asthma and COPD. She takes Zyrtec, Breo and Albuterol as prescribed. She did have a COPD exacerbation treated with Doxycycline and Prednisone 09/2015. She has an appt with her pulmonologist 11/22/14. Xray from 07/2015 reviewed.  Review of Systems      Past Medical History  Diagnosis Date  . Diabetes ([place])   . Asthma   . Thyroid disease   . COPD (chronic obstructive pulmonary disease) ([place])   . Arthritis     Family History  Problem Relation Age of Onset  . Arthritis Father   . Diabetes Sister   . Diabetes Daughter   . Cancer Neg Hx   . Heart disease Neg Hx   . Stroke Neg Hx     Social History   Social History  . Marital Status: Unknown    Spouse Name: N/A  . Number of Children: N/A  . Years of Education: N/A   Occupational History  . Retired    Social History Main Topics  . Smoking status: Former Smoker -- 1.50 packs/day for 30 years    Types: Cigarettes    Quit date: 11/15/1987  . Smokeless tobacco: Never Used  . Alcohol Use: No  . Drug Use: No  . Sexual Activity: Not on file   Other Topics Concern  . Not on file   Social History Narrative    No Known Allergies   Constitutional: Positive headache, fatigue. Denies fever or abrupt weight changes.  HEENT:  Positive runny nose. Denies eye redness, eye pain, pressure behind the eyes, facial pain, nasal congestion, ear pain, ringing in the ears, wax buildup or sore throat. Respiratory: Positive cough and shortness of breath. Denies difficulty breathing or shortness of breath.  Cardiovascular: Denies chest pain, chest tightness, palpitations or swelling in the hands or feet.   No  other specific complaints in a complete review of systems (except as listed in HPI above).  Objective:   BP 140/94 mmHg  Pulse 110  Temp(Src) 98.7 F (37.1 C) (Oral)  Wt   SpO2 96%  Wt Readings from Last 3 Encounters:  08/20/15 201 lb (91.173 kg)  08/17/15 204 lb (92.534 kg)  06/19/15 199 lb (90.266 kg)     General: Appears her stated age, ill appearing in NAD. HEENT: Head: normal shape and size; Eyes: sclera white, no icterus, conjunctiva pink; Ears: Tm's gray and intact, normal light reflex; Nose: mucosa pink and moist, septum midline; Throat/Mouth: + PND. Teeth present, mucosa pink and moist, no exudate noted, no lesions or ulcerations noted.  Neck: No cervical lymphadenopathy.  Cardiovascular: Normal rate and rhythm. S1,S2 noted.  No murmur, rubs or gallops noted.  Pulmonary/Chest: Normal effort and diminished breath sounds. No respiratory distress. No wheezesnoted.      Assessment & Plan:   COPD exacerbation:  Get some rest and drink plenty of water Do salt water gargles for the sore throat Continue Zyrtec, Albuterol and Breo eRx for Augmentin BID x 10 days eRx for Tussionex cough syrup To ER over the weekend if worse  RTC as needed or if symptoms persist.

## 2015-11-20 ENCOUNTER — Telehealth: Payer: Self-pay | Admitting: Internal Medicine

## 2015-11-20 MED ORDER — PREDNISONE 20 MG PO TABS: ORAL_TABLET | ORAL | Status: DC

## 2015-11-20 NOTE — Telephone Encounter (Signed)
Baity pt--pt has been seen 3-4 times in the past 2 months for COPD-cough---pt still c/o of no improvement in Sx---currently on Augmentin x 10 days--please advise

## 2015-11-20 NOTE — Telephone Encounter (Signed)
Currently on day 7/10 augmentin. plz get current sxs. If persistent wheezing would offer extending prednisone course. Ensure no fevers/chills or severe dyspnea. Any improvement on augmentin? If worsening over weekend will need ER eval. Has appt Monday with pulm.

## 2015-11-20 NOTE — Telephone Encounter (Signed)
Spoke with patient. She denies fever or more dyspnea than normal. She said she has COPD, so she is always dyspneic. She said she is still wheezing but can't tell if it's as bad or the same as when she was in the office. She said her main complaint is the coughing since she is finally bringing mucous up. She said she may some a slight improvement on the augmentin but she doesn't know. She will check with pharmacy to see if meds have been sent in. Knows to go to ER if worsening over the weekend.

## 2015-11-20 NOTE — Telephone Encounter (Signed)
Prolonged prednisone taper. Sent to pharmacy.

## 2015-11-20 NOTE — Telephone Encounter (Signed)
Pt was seen 1 week ago for bronchitis and still feels bad. She does not want to go to the ed and she does not want to got to another LB.  She is asking for a return call cb is [phone]

## 2015-11-23 ENCOUNTER — Ambulatory Visit (INDEPENDENT_AMBULATORY_CARE_PROVIDER_SITE_OTHER): Payer: Medicare Other | Admitting: Pulmonary Disease

## 2015-11-23 ENCOUNTER — Encounter: Payer: Self-pay | Admitting: Pulmonary Disease

## 2015-11-23 VITALS — BP 130/64 | HR 90 | Ht 64.0 in | Wt 195.6 lb

## 2015-11-23 DIAGNOSIS — J449 Chronic obstructive pulmonary disease, unspecified: Secondary | ICD-10-CM | POA: Diagnosis not present

## 2015-11-23 DIAGNOSIS — J45901 Unspecified asthma with (acute) exacerbation: Secondary | ICD-10-CM

## 2015-11-23 MED ORDER — UMECLIDINIUM BROMIDE 62.5 MCG/INH IN AEPB: 1.0000 | INHALATION_SPRAY | Freq: Every day | RESPIRATORY_TRACT | Status: AC

## 2015-11-23 MED ORDER — DOXYCYCLINE HYCLATE 100 MG PO TABS: 100.0000 mg | ORAL_TABLET | Freq: Two times a day (BID) | ORAL | Status: DC

## 2015-11-23 NOTE — Patient Instructions (Signed)
Stop Augmentin Doxycycline 100 mg twice a day for 7 days Complete prednisone as prescribed  By Dr Darnell Level Then back to prednisone 5 mg daily until seen again Sample of Spiriva provided Follow up next week We will work on getting a replacement nebulizer machine

## 2015-11-23 NOTE — Progress Notes (Signed)
Pt profile: 58 F recently moved from [place]. Initially seen by Dr Melvyn Novas. Diagnosed with COPD in 2011. Graded as Gold I by Dr Melvyn Novas. Chronic prednisone therapy  IMPRESSION (08/18/15): 1) Mild COPD, Gold's I 2) Obesity with recent wt gain 3) Chronic prednisone therapy 4) Dyspnea - likely due to obesity > COPD   PLAN/RECS (08/18/15):  Continue Breo and albuterol inhaler as previously Change prednisone to 5 mg every other day Follow up in 3 months - goal 10 pound weight loss between now and then  Subj: Seen by Primary Care 12/30 with cough productive of green mucus and increased dyspnea. Diagnosed with acute bronchitis and treated with Augmentin and Prednisone dosepak. Received Rx for more prednisone as her symptoms were slow to improve. Continues to complain of shortness of breath and cough productive of green mucus. No fevers. Dyspnea is marginally improved. No chest pain or hemoptysis  Obj: Filed Vitals:   11/23/15 1245  BP: 130/64  Pulse: 90  Height: 5\' 4"  (1.626 m)  Weight: 195 lb 9.6 oz (88.724 kg)  SpO2: 96%     Gen: NAD HEENT: WNL Neck: No JVD Chest: coarse BS throughout, scattered wheezes Cardiac: Reg, no M [id number], soft, NT, +BS Ext: No C/C//E  BMET    Component Value Date/Time   NA 136 03/23/2015 1052   K 4.1 03/23/2015 1052   CL 99 03/23/2015 1052   CO2 31 03/23/2015 1052   GLUCOSE 121* 03/23/2015 1052   BUN 30* 03/23/2015 1052   CREATININE 0.78 03/23/2015 1052   CALCIUM 9.9 03/23/2015 1052    CBC    Component Value Date/Time   WBC 11.3* 03/23/2015 1052   RBC 4.93 03/23/2015 1052   HGB 14.8 03/23/2015 1052   HCT 44.0 03/23/2015 1052   PLT 337.0 03/23/2015 1052   MCV 89.3 03/23/2015 1052   MCHC 33.6 03/23/2015 1052   RDW 14.7 03/23/2015 1052    CXR: No new film   IMPRESSION: 1) Mild COPD, Gold's I with asthmatic component  2) acute asthmatic bronchitis 3) Chronic prednisone therapy 4) Obesity - has lost 9 # since last visit. I have congratulated  her on this success 5) Chronic dyspnea - likely due to obesity > COPD. Therefore, need to continue to work towards getting her off chronic prednisone if possible  PLAN/RECS:  Stop Augmentin Doxycycline 100 mg twice a day for 7 days Complete prednisone as prescribed  By Dr Danise Mina office, then back to prednisone 5 mg daily until seen again  Eventually hope to taper this to off Continue Breo and PRN albuterol inhaler as previously Sample of Spiriva provided  On follow up, consider adding this medication chronically if deemed appropriate We will work on getting a replacement nebulizer machine per her request Follow up next week for recheck   Merton Border, MD PCCM service Mobile [phone] Pager [phone]

## 2015-12-02 ENCOUNTER — Ambulatory Visit (INDEPENDENT_AMBULATORY_CARE_PROVIDER_SITE_OTHER): Payer: Medicare Other | Admitting: Internal Medicine

## 2015-12-02 ENCOUNTER — Encounter: Payer: Self-pay | Admitting: Internal Medicine

## 2015-12-02 VITALS — BP 134/66 | HR 95 | Ht 64.0 in | Wt 195.4 lb

## 2015-12-02 DIAGNOSIS — J309 Allergic rhinitis, unspecified: Secondary | ICD-10-CM

## 2015-12-02 DIAGNOSIS — J441 Chronic obstructive pulmonary disease with (acute) exacerbation: Secondary | ICD-10-CM | POA: Diagnosis not present

## 2015-12-02 MED ORDER — HYDROCOD POLST-CPM POLST ER 10-8 MG/5ML PO SUER: 5.0000 mL | Freq: Every evening | ORAL | Status: DC | PRN

## 2015-12-02 MED ORDER — ALBUTEROL SULFATE (2.5 MG/3ML) 0.083% IN NEBU
2.5000 mg | INHALATION_SOLUTION | RESPIRATORY_TRACT | Status: DC | PRN
Start: 2015-12-02 — End: 2016-12-23

## 2015-12-02 MED ORDER — IPRATROPIUM BROMIDE 0.03 % NA SOLN: 2.0000 | Freq: Four times a day (QID) | NASAL | Status: DC

## 2015-12-02 MED ORDER — PREDNISONE 50 MG PO TABS: ORAL_TABLET | ORAL | Status: DC

## 2015-12-02 NOTE — Patient Instructions (Signed)
Chronic Obstructive Pulmonary Disease Chronic obstructive pulmonary disease (COPD) is a common lung condition in which airflow from the lungs is limited. COPD is a general term that can be used to describe many different lung problems that limit airflow, including both chronic bronchitis and emphysema. If you have COPD, your lung function will probably never return to normal, but there are measures you can take to improve lung function and make yourself feel better. CAUSES   Smoking (common).  Exposure to secondhand smoke.  Genetic problems.  Chronic inflammatory lung diseases or recurrent infections. SYMPTOMS  Shortness of breath, especially with physical activity.  Deep, persistent (chronic) cough with a large amount of thick mucus.  Wheezing.  Rapid breaths (tachypnea).  Gray or bluish discoloration (cyanosis) of the skin, especially in your fingers, toes, or lips.  Fatigue.  Weight loss.  Frequent infections or episodes when breathing symptoms become much worse (exacerbations).  Chest tightness. DIAGNOSIS Your health care provider will take a medical history and perform a physical examination to diagnose COPD. Additional tests for COPD may include:  Lung (pulmonary) function tests.  Chest X-ray.  CT scan.  Blood tests. TREATMENT  Treatment for COPD may include:  Inhaler and nebulizer medicines. These help manage the symptoms of COPD and make your breathing more comfortable.  Supplemental oxygen. Supplemental oxygen is only helpful if you have a low oxygen level in your blood.  Exercise and physical activity. These are beneficial for nearly all people with COPD.  Lung surgery or transplant.  Nutrition therapy to gain weight, if you are underweight.  Pulmonary rehabilitation. This may involve working with a team of health care providers and specialists, such as respiratory, occupational, and physical therapists. HOME CARE INSTRUCTIONS  Take all medicines  (inhaled or pills) as directed by your health care provider.  Avoid over-the-counter medicines or cough syrups that dry up your airway (such as antihistamines) and slow down the elimination of secretions unless instructed otherwise by your health care provider.  If you are a smoker, the most important thing that you can do is stop smoking. Continuing to smoke will cause further lung damage and breathing trouble. Ask your health care provider for help with quitting smoking. He or she can direct you to community resources or hospitals that provide support.  Avoid exposure to irritants such as smoke, chemicals, and fumes that aggravate your breathing.  Use oxygen therapy and pulmonary rehabilitation if directed by your health care provider. If you require home oxygen therapy, ask your health care provider whether you should purchase a pulse oximeter to measure your oxygen level at home.  Avoid contact with individuals who have a contagious illness.  Avoid extreme temperature and humidity changes.  Eat healthy foods. Eating smaller, more frequent meals and resting before meals may help you maintain your strength.  Stay active, but balance activity with periods of rest. Exercise and physical activity will help you maintain your ability to do things you want to do.  Preventing infection and hospitalization is very important when you have COPD. Make sure to receive all the vaccines your health care provider recommends, especially the pneumococcal and influenza vaccines. Ask your health care provider whether you need a pneumonia vaccine.  Learn and use relaxation techniques to manage stress.  Learn and use controlled breathing techniques as directed by your health care provider. Controlled breathing techniques include:  Pursed lip breathing. Start by breathing in (inhaling) through your nose for 1 second. Then, purse your lips as if you were   going to whistle and breathe out (exhale) through the  pursed lips for 2 seconds.  Diaphragmatic breathing. Start by putting one hand on your abdomen just above your waist. Inhale slowly through your nose. The hand on your abdomen should move out. Then purse your lips and exhale slowly. You should be able to feel the hand on your abdomen moving in as you exhale.  Learn and use controlled coughing to clear mucus from your lungs. Controlled coughing is a series of short, progressive coughs. The steps of controlled coughing are: 1. Lean your head slightly forward. 2. Breathe in deeply using diaphragmatic breathing. 3. Try to hold your breath for 3 seconds. 4. Keep your mouth slightly open while coughing twice. 5. Spit any mucus out into a tissue. 6. Rest and repeat the steps once or twice as needed. SEEK MEDICAL CARE IF:  You are coughing up more mucus than usual.  There is a change in the color or thickness of your mucus.  Your breathing is more labored than usual.  Your breathing is faster than usual. SEEK IMMEDIATE MEDICAL CARE IF:  You have shortness of breath while you are resting.  You have shortness of breath that prevents you from:  Being able to talk.  Performing your usual physical activities.  You have chest pain lasting longer than 5 minutes.  Your skin color is more cyanotic than usual.  You measure low oxygen saturations for longer than 5 minutes with a pulse oximeter. MAKE SURE YOU:  Understand these instructions.  Will watch your condition.  Will get help right away if you are not doing well or get worse.   This information is not intended to replace advice given to you by your health care provider. Make sure you discuss any questions you have with your health care provider.   Document Released: 08/10/2005 Document Revised: 11/21/2014 Document Reviewed: 06/27/2013 Elsevier Interactive Patient Education 2016 Elsevier Inc.  

## 2015-12-02 NOTE — Progress Notes (Signed)
Pt profile: 82 F recently moved from [place].  Initially seen by Dr Melvyn Novas. Diagnosed with COPD in 2011.  Graded as Gold I by Dr Melvyn Novas. Has been maintained on chronic prednisone   [id number] with SOB, wheezing and chest and nasal congestion Subj: Last seen by Dr Alva Garnet on 1/9 Patient has been on ABX and tapering doses of steroids last 1 month Still feels SOB, wheezing, nasal congestion and chest congestion Uses breo, does not like incruse Has received new neb machine No lower ext sweliing  Obj: Filed Vitals:   12/02/15 0927  BP: 134/66  Pulse: 95  Height: 5\' 4"  (1.626 m)  Weight: 195 lb 6.4 oz (88.633 kg)  SpO2: 93%   Review of Systems  Constitutional: Positive for malaise/fatigue. Negative for fever, chills and weight loss.  HENT: Positive for congestion and sore throat.   Respiratory: Positive for cough, shortness of breath and wheezing.   Gastrointestinal: Negative for nausea and vomiting.  Neurological: Positive for headaches.  All other systems reviewed and are negative.   Physical Exam  Constitutional: No distress.  HENT:  Head: Normocephalic.  Mouth/Throat: No oropharyngeal exudate.  Cardiovascular: Normal rate, regular rhythm and normal heart sounds.   Pulmonary/Chest: Effort normal and breath sounds normal. No respiratory distress. She has no wheezes. She has no rales.  Skin: She is not diaphoretic.      82 yo white female with ASTHMA /COPD with mild exacerbation at this time, no abx at this time  Will prescribe the following...  1.prednisone 50 mg daily for 10 days 2.albuterol nebs every 4 hrs 3.atrovent nasal sprays 4.tussionex as needed   Follow up in 10 days with next available provider If symptoms persist will probably need imaging and ECHO  The Patient requires high complexity decision making for assessment and support, frequent evaluation and titration of therapies. Patient/Family are satisfied with Plan of action and management. All questions  answered  Corrin Parker, M.D.  [facility]  Medical Director [place] Director [facility] Cardio-Pulmonary Department

## 2015-12-11 ENCOUNTER — Encounter: Payer: Self-pay | Admitting: Pulmonary Disease

## 2015-12-11 ENCOUNTER — Ambulatory Visit (INDEPENDENT_AMBULATORY_CARE_PROVIDER_SITE_OTHER): Payer: Medicare Other | Admitting: Pulmonary Disease

## 2015-12-11 VITALS — BP 142/84 | HR 93 | Ht 64.0 in | Wt 194.0 lb

## 2015-12-11 DIAGNOSIS — R059 Cough, unspecified: Secondary | ICD-10-CM

## 2015-12-11 DIAGNOSIS — R05 Cough: Secondary | ICD-10-CM

## 2015-12-11 DIAGNOSIS — J329 Chronic sinusitis, unspecified: Secondary | ICD-10-CM

## 2015-12-11 DIAGNOSIS — J449 Chronic obstructive pulmonary disease, unspecified: Secondary | ICD-10-CM | POA: Diagnosis not present

## 2015-12-11 MED ORDER — TRIAMCINOLONE ACETONIDE 55 MCG/ACT NA AERO: 2.0000 | INHALATION_SPRAY | Freq: Every day | NASAL | Status: DC

## 2015-12-13 NOTE — Progress Notes (Signed)
Pt profile: 21 F recently moved from [place]. Initially seen by Dr Melvyn Novas. Diagnosed with COPD in 2011. Graded as Gold I by Dr Melvyn Novas. Chronic prednisone therapy  ROV 08/18/15: IMPRESSION: 1) Mild COPD, Gold's I 2) Obesity with recent wt gain 3) Chronic prednisone therapy 4) Dyspnea - likely due to obesity > COPD  PLAN:  Continue Breo and albuterol inhaler as previously Change prednisone to 5 mg every other day Follow up in 3 months - goal 10 pound weight loss between now and then  ROV 11/23/15: IMPRESSION: 1) Mild COPD, Gold's I with asthmatic component  2) acute asthmatic bronchitis 3) Chronic prednisone therapy 4) Obesity - has lost 9 # since last visit. I have congratulated her on this success 5) Chronic dyspnea - likely due to obesity > COPD. Therefore, need to continue to work towards getting her off chronic prednisone if possible PLAN/RECS:  Stop Augmentin Doxycycline 100 mg twice a day for 7 days Complete prednisone as prescribed  By Dr Danise Mina office, then back to prednisone 5 mg daily until seen again Continue Breo and PRN albuterol inhaler as previously Sample of Spiriva provided  ROV 12/02/15 (Dr Mortimer Fries):  1.prednisone 50 mg daily for 10 days 2.albuterol nebs every 4 hrs 3.atrovent nasal sprays 4.tussionex as needed  Subj: Dr Zoila Shutter note reviewed. Respiratory status back to baseline. Still with nasal congestion and now with dry nose  Obj: Filed Vitals:   12/11/15 1107  BP: 142/84  Pulse: 93  Height: 5\' 4"  (1.626 m)  Weight: 194 lb (87.998 kg)  SpO2: 94%     Gen: NAD HEENT: nasal mucosa erythematous, moderate mucoid nasal secretions Neck: No JVD Chest: coarse BS throughout, scattered wheezes Cardiac: Reg, no M [id number], soft, NT, +BS Ext: No C/C//E  BMET    Component Value Date/Time   NA 136 03/23/2015 1052   K 4.1 03/23/2015 1052   CL 99 03/23/2015 1052   CO2 31 03/23/2015 1052   GLUCOSE 121* 03/23/2015 1052   BUN 30* 03/23/2015 1052   CREATININE  0.78 03/23/2015 1052   CALCIUM 9.9 03/23/2015 1052    CBC    Component Value Date/Time   WBC 11.3* 03/23/2015 1052   RBC 4.93 03/23/2015 1052   HGB 14.8 03/23/2015 1052   HCT 44.0 03/23/2015 1052   PLT 337.0 03/23/2015 1052   MCV 89.3 03/23/2015 1052   MCHC 33.6 03/23/2015 1052   RDW 14.7 03/23/2015 1052    CXR: No new film   IMPRESSION: 1) Mild COPD, Gold's I with asthmatic component  2) acute asthmatic bronchitis, resolved 3) Chronic prednisone therapy - 2.5 mg daily 4) Obesity - has lost 9 # since last visit. I have congratulated her on this success 5) Chronic dyspnea  6) chronic rhinosinusitis  PLAN/RECS:  Cont prednisone 2.5 mg daily   Eventually hope to taper this to off Continue Breo, Spiriva and PRN albuterol inhaler as previously DC nasal ipratropium Nasacort - 2 sprays per nostril daily Follow up 4 weeks   Merton Border, MD PCCM service Mobile [phone] Pager [phone]

## 2015-12-15 ENCOUNTER — Telehealth: Payer: Self-pay | Admitting: Internal Medicine

## 2015-12-15 NOTE — Telephone Encounter (Signed)
Spoke with Remo Lipps (DPR signed) and pt is sitting up eating now and pt wants to wait until 12/16/15 and see how she feels; if pt does not feel better will call for appt; advised if pts condition changes or worsens to cb. Remo Lipps voiced understanding.

## 2015-12-15 NOTE — Telephone Encounter (Signed)
Agree with pt. If no improvement by Wednesday, can call for appt

## 2015-12-15 NOTE — Telephone Encounter (Signed)
Patient Name: Megan Bradshaw  DOB: 10/23/1930    Initial Comment Caller states her Mom had bronchitis. She ate some sweet potatoes that had been in the cupboard for 4-5 months. Vomiting and diarrhea.    Nurse Assessment  Nurse: Mallie Mussel, RN, Alveta Heimlich Date/Time Eilene Ghazi Time): 12/15/2015 9:24:58 AM  Confirm and document reason for call. If symptomatic, describe symptoms. You must click the next button to save text entered. ---Caller states that her mother began vomiting on Sunday evening. She has not vomited since Sunday. She had some diarrhea which began Sunday. She has not had any today yet, but yesterday she doesn't think she had any yesterday either. She is very fatigued. She is not eating much. She tires more easily than usual. She last urinated about 15 minutes ago. Denies fever. She has diabetes. Current reading is 171,  Has the patient traveled out of the country within the last 30 days? ---No  Does the patient have any new or worsening symptoms? ---Yes  Will a triage be completed? ---Yes  Related visit to physician within the last 2 weeks? ---No  Does the PT have any chronic conditions? (i.e. diabetes, asthma, etc.) ---Yes  List chronic conditions. ---Diabetes  Is this a behavioral health or substance abuse call? ---No     Guidelines    Guideline Title Affirmed Question Affirmed Notes  Weakness (Generalized) and Fatigue [1] MODERATE weakness (i.e., interferes with work, school, normal activities) AND [2] cause unknown (Exceptions: weakness with acute minor illness, or weakness from poor fluid intake)    Final Disposition User   See Physician within 4 Hours (or PCP triage) Mallie Mussel, RN, [place] states that her mother will not come in today, she wants to wait until tomorrow. Advised her that I will forward this to the office and someone will be calling her back. She verbalized understanding.   Referrals  REFERRED TO PCP OFFICE   Disagree/Comply: Comply

## 2015-12-18 ENCOUNTER — Ambulatory Visit (INDEPENDENT_AMBULATORY_CARE_PROVIDER_SITE_OTHER): Payer: Medicare Other | Admitting: Internal Medicine

## 2015-12-18 ENCOUNTER — Encounter: Payer: Self-pay | Admitting: Internal Medicine

## 2015-12-18 VITALS — BP 134/74 | HR 106 | Temp 98.9°F

## 2015-12-18 DIAGNOSIS — E119 Type 2 diabetes mellitus without complications: Secondary | ICD-10-CM | POA: Diagnosis not present

## 2015-12-18 DIAGNOSIS — A084 Viral intestinal infection, unspecified: Secondary | ICD-10-CM | POA: Diagnosis not present

## 2015-12-18 DIAGNOSIS — J42 Unspecified chronic bronchitis: Secondary | ICD-10-CM

## 2015-12-18 LAB — COMPREHENSIVE METABOLIC PANEL
ALBUMIN: 3.6 g/dL (ref 3.5–5.2)
ALK PHOS: 69 U/L (ref 39–117)
ALT: 27 U/L (ref 0–35)
AST: 25 U/L (ref 0–37)
BILIRUBIN TOTAL: 0.6 mg/dL (ref 0.2–1.2)
BUN: 30 mg/dL — ABNORMAL HIGH (ref 6–23)
CALCIUM: 9.2 mg/dL (ref 8.4–10.5)
CO2: 24 meq/L (ref 19–32)
CREATININE: 1.13 mg/dL (ref 0.40–1.20)
Chloride: 97 mEq/L (ref 96–112)
GFR: 48.82 mL/min — AB (ref >60.00)
Glucose, Bld: 252 mg/dL — ABNORMAL HIGH (ref 70–99)
Potassium: 3.4 mEq/L — ABNORMAL LOW (ref 3.5–5.1)
Sodium: 133 mEq/L — ABNORMAL LOW (ref 135–145)
TOTAL PROTEIN: 7.1 g/dL (ref 6.0–8.3)

## 2015-12-18 LAB — CBC
HCT: 46.1 % — ABNORMAL HIGH (ref 36.0–46.0)
HEMOGLOBIN: 15 g/dL (ref 12.0–15.0)
MCHC: 32.6 g/dL (ref 30.0–36.0)
MCV: 90.7 fl (ref 78.0–100.0)
PLATELETS: 245 10*3/uL (ref 150.0–400.0)
RBC: 5.08 Mil/uL (ref 3.87–5.11)
RDW: 13.2 % (ref 11.5–15.5)
WBC: 10.8 10*3/uL — ABNORMAL HIGH (ref 4.0–10.5)

## 2015-12-18 LAB — HEMOGLOBIN A1C: HEMOGLOBIN A1C: 10.2 % — AB (ref 4.6–6.5)

## 2015-12-18 NOTE — Patient Instructions (Signed)
Norovirus Infection °A norovirus infection is caused by exposure to a virus in a group of similar viruses (noroviruses). This type of infection causes inflammation in your stomach and intestines (gastroenteritis). Norovirus is the most common cause of gastroenteritis. It also causes food poisoning. °Anyone can get a norovirus infection. It spreads very easily (contagious). You can get it from contaminated food, water, surfaces, or other people. Norovirus is found in the stool or vomit of infected people. You can spread the infection as soon as you feel sick until 2 weeks after you recover.  °Symptoms usually begin within 2 days after you become infected. Most norovirus symptoms affect the digestive system. °CAUSES °Norovirus infection is caused by contact with norovirus. You can catch norovirus if you: °· Eat or drink something contaminated with norovirus. °· Touch surfaces or objects contaminated with norovirus and then put your hand in your mouth. °· Have direct contact with an infected person who has symptoms. °· Share food, drink, or utensils with someone with who is sick with norovirus. °SIGNS AND SYMPTOMS °Symptoms of norovirus may include: °· Nausea. °· Vomiting. °· Diarrhea. °· Stomach cramps. °· Fever. °· Chills. °· Headache. °· Muscle aches. °· Tiredness. °DIAGNOSIS °Your health care provider may suspect norovirus based on your symptoms and physical exam. Your health care provider may also test a sample of your stool or vomit for the virus.  °TREATMENT °There is no specific treatment for norovirus. Most people get better without treatment in about 2 days. °HOME CARE INSTRUCTIONS °· Replace lost fluids by drinking plenty of water or rehydration fluids containing important minerals called electrolytes. This prevents dehydration. Drink enough fluid to keep your urine clear or pale yellow. °· Do not prepare food for others while you are infected. Wait at least 3 days after recovering from the illness to do  that. °PREVENTION  °· Wash your hands often, especially after using the toilet or changing a diaper. °· Wash fruits and vegetables thoroughly before preparing or serving them. °· Throw out any food that a sick person may have touched. °· Disinfect contaminated surfaces immediately after someone in the household has been sick. Use a bleach-based household cleaner. °· Immediately remove and wash soiled clothes or sheets. °SEEK MEDICAL CARE IF: °· Your vomiting, diarrhea, and stomach pain is getting worse. °· Your symptoms of norovirus do not go away after 2-3 days. °SEEK IMMEDIATE MEDICAL CARE IF:  °You develop symptoms of dehydration that do not improve with fluid replacement. This may include: °· Excessive sleepiness. °· Lack of tears. °· Dry mouth. °· Dizziness when standing. °· Weak pulse. °  °This information is not intended to replace advice given to you by your health care provider. Make sure you discuss any questions you have with your health care provider. °  °Document Released: 01/21/2003 Document Revised: 11/21/2014 Document Reviewed: 04/10/2014 °Elsevier Interactive Patient Education ©2016 [employer] ° °

## 2015-12-18 NOTE — Progress Notes (Signed)
Subjective:    Patient ID: Megan Bradshaw, female    DOB: April 16, 1930, 82 y.o.   MRN: [id number]  HPI  Pt presents to the clinic today c/o fatigue, diarrhea and runny nose with PND. Sunday, 5 days ago, she had an episode of nausea, vomiting and diarrhea. She states that this occurred after eating old sweet potatoes out of the [place]. Since that episode she has not been eating much. She has been taking Pepto Bismol. The diarrhea is getting better now. She states she feels her breathing has worsened. Admits to SOB, wheezing and dizziness. She recently had bronchitis and was given prednisone. The pulmonologist said that her bronchitis had cleared at her last appointment. She denies chest pain/tightness. She was also given Nasocort, but complains it has dried out her nose.  Medical history includes steroid induced diabetes mellitus, COPD, hypothyroid and obesity. Her last TSH was 1.88 on 03/23/15. Her B12 and Vit D were also normal at that time.   Pts last A1C was 7.5%. She has been taking Metformin. When her daughter called a few days ago her blood sugar was 171. She does admit to several episodes of low blood sugar.      Review of Systems  Past Medical History  Diagnosis Date  . Diabetes ([place])   . Asthma   . Thyroid disease   . COPD (chronic obstructive pulmonary disease) ([place])   . Arthritis     Current Outpatient Prescriptions  Medication Sig Dispense Refill  . albuterol (PROVENTIL) (2.5 MG/3ML) 0.083% nebulizer solution Take 3 mLs (2.5 mg total) by nebulization every 4 (four) hours as needed for wheezing or shortness of breath. 75 mL 12  . albuterol (VENTOLIN HFA) 108 (90 BASE) MCG/ACT inhaler Inhale 1-2 puffs into the lungs every 6 (six) hours as needed for wheezing or shortness of breath. 1 Inhaler 2  . cetirizine (ZYRTEC) 10 MG tablet Take 10 mg by mouth daily.    . Cholecalciferol (VITAMIN D PO) Take 1 tablet by mouth daily.    . Fluticasone Furoate-Vilanterol (BREO ELLIPTA) 100-25  MCG/INH AEPB Inhale 1 puff into the lungs every morning. 180 each 2  . glipiZIDE (GLUCOTROL XL) 5 MG 24 hr tablet Take 1 tablet (5 mg total) by mouth daily with breakfast. 90 tablet 1  . ketoconazole (NIZORAL) 2 % cream APPLY EXTERNALLY TO THE AFFECTED AREA DAILY 15 g 0  . levothyroxine (SYNTHROID, LEVOTHROID) 75 MCG tablet Take 1 tablet (75 mcg total) by mouth daily before breakfast. 90 tablet 1  . meloxicam (MOBIC) 15 MG tablet Take 1 tablet (15 mg total) by mouth every other day. 15 tablet 2  . metFORMIN (GLUCOPHAGE) 500 MG tablet Take 1 tablet (500 mg total) by mouth 2 (two) times daily with a meal. 180 tablet 1  . Multiple Vitamin (MULTIVITAMIN) capsule Take 1 capsule by mouth daily.    Marland Kitchen oxybutynin (DITROPAN-XL) 5 MG 24 hr tablet Take 1 tablet (5 mg total) by mouth at bedtime. 30 tablet 2  . predniSONE (DELTASONE) 5 MG tablet Take 1 tablet (5 mg total) by mouth daily. (Patient taking differently: Take 5 mg by mouth every other day. ) 90 tablet 1  . predniSONE (DELTASONE) 50 MG tablet One tablet daily for 10 days 10 tablet 1  . triamcinolone (NASACORT AQ) 55 MCG/ACT AERO nasal inhaler Place 2 sprays into the nose daily. 1 Inhaler 12  . ipratropium (ATROVENT) 0.03 % nasal spray Place 2 sprays into the nose 4 (four) times daily. (Patient not  taking: Reported on 12/18/2015) 30 mL 1   No current facility-administered medications for this visit.    No Known Allergies  Family History  Problem Relation Age of Onset  . Arthritis Father   . Diabetes Sister   . Diabetes Daughter   . Cancer Neg Hx   . Heart disease Neg Hx   . Stroke Neg Hx     Social History   Social History  . Marital Status: Unknown    Spouse Name: N/A  . Number of Children: N/A  . Years of Education: N/A   Occupational History  . Retired    Social History Main Topics  . Smoking status: Former Smoker -- 1.50 packs/day for 30 years    Types: Cigarettes    Quit date: 11/15/1987  . Smokeless tobacco: Never Used  .  Alcohol Use: No  . Drug Use: No  . Sexual Activity: Not on file   Other Topics Concern  . Not on file   Social History Narrative     Constitutional: Positive fatigue. Denies fever, malaise, or headache. HEENT: Positive nasal congestion, runny nose and PND. Denies eye pain, eye redness, ear pain, ringing in the ears, bloody nose, or sore throat. Respiratory: Positive SOB, difficulty breathing, cough  and wheezing. Cardiovascular: Denies chest pain and chest tightness. Gastrointestinal: Positive diarrhea. Denies abdominal pain, bloating, constipation, or blood in the stool.   No other specific complaints in a complete review of systems (except as listed in HPI above).     Objective:   Physical Exam BP 134/74 mmHg  Pulse 106  Temp(Src) 98.9 F (37.2 C) (Oral)  Wt   SpO2 98% Wt Readings from Last 3 Encounters:  12/11/15 194 lb (87.998 kg)  12/02/15 195 lb 6.4 oz (88.633 kg)  11/23/15 195 lb 9.6 oz (88.724 kg)    General: Appears her stated age, chronically ill appearing, in NAD. Skin: Warm, dry and intact.  HEENT: Head: normal shape and size; Eyes: sclera white, no icterus, conjunctiva pink; Ears: Tm's gray and intact, normal light reflex; Nose: mucosa pink and moist, left nostril had some crusting and inflammation, septum midline; Throat/Mouth: +PND. Teeth present, mucosa erythematous and moist, no exudate, lesions or ulcerations noted.  Neck:  Neck supple, trachea midline. No masses, lumps or lymphadenopathy present.  Cardiovascular: Normal rate and rhythm. S1,S2 noted.  No murmur, rubs or gallops noted.   Pulmonary/Chest: Normal effort and positive vesicular breath sounds. No respiratory distress. No wheezes, rales or ronchi noted.  Abdomen: Soft, nontender. Active bowel sounds.  BMET    Component Value Date/Time   NA 136 03/23/2015 1052   K 4.1 03/23/2015 1052   CL 99 03/23/2015 1052   CO2 31 03/23/2015 1052   GLUCOSE 121* 03/23/2015 1052   BUN 30* 03/23/2015 1052    CREATININE 0.78 03/23/2015 1052   CALCIUM 9.9 03/23/2015 1052    Lipid Panel     Component Value Date/Time   CHOL 178 03/23/2015 1052   TRIG 146.0 03/23/2015 1052   HDL 62.10 03/23/2015 1052   CHOLHDL 3 03/23/2015 1052   VLDL 29.2 03/23/2015 1052   LDLCALC 87 03/23/2015 1052    CBC    Component Value Date/Time   WBC 11.3* 03/23/2015 1052   RBC 4.93 03/23/2015 1052   HGB 14.8 03/23/2015 1052   HCT 44.0 03/23/2015 1052   PLT 337.0 03/23/2015 1052   MCV 89.3 03/23/2015 1052   MCHC 33.6 03/23/2015 1052   RDW 14.7 03/23/2015 1052  Hgb A1C Lab Results  Component Value Date   HGBA1C 7.5* 03/23/2015          Assessment & Plan:  Nausea, vomitting and diarrhea:  Likely viral gastroenteritis Seems to be improving, advance diet as tolerated. CBC, CMET and A1C today  Gustatory Rhinitis:  Continue Nasocort in the morning and saline at night   Shortness of Breath:  Likely related to COPD Continue current medications Continue to follow with pulmonology  RTC as needed or if symptoms worsen.

## 2015-12-18 NOTE — Progress Notes (Signed)
Pre visit review using our clinic review tool, if applicable. No additional management support is needed unless otherwise documented below in the visit note. 

## 2015-12-23 ENCOUNTER — Telehealth: Payer: Self-pay

## 2015-12-23 DIAGNOSIS — A084 Viral intestinal infection, unspecified: Secondary | ICD-10-CM | POA: Diagnosis not present

## 2015-12-23 NOTE — Addendum Note (Signed)
Addended by: Marchia Bond on: 12/23/2015 03:31 PM   Modules accepted: Orders

## 2015-12-23 NOTE — Telephone Encounter (Signed)
Pt left v/m; pt is supposed to acquire a stool specimen and has questions prior to getting specimen. Pt request cb ASAP.

## 2015-12-24 LAB — C. DIFFICILE GDH AND TOXIN A/B
C. DIFF TOXIN A/B: NOT DETECTED
C. difficile GDH: NOT DETECTED

## 2015-12-24 NOTE — Telephone Encounter (Signed)
Pt returned stool specimen.

## 2015-12-27 LAB — STOOL CULTURE

## 2015-12-28 ENCOUNTER — Telehealth: Payer: Self-pay | Admitting: Internal Medicine

## 2015-12-28 NOTE — Telephone Encounter (Signed)
Patient's daughter,Joan,called to find out results of patient's lab work.

## 2016-01-05 ENCOUNTER — Other Ambulatory Visit: Payer: Self-pay | Admitting: Internal Medicine

## 2016-01-12 ENCOUNTER — Encounter: Payer: Self-pay | Admitting: Pulmonary Disease

## 2016-01-12 ENCOUNTER — Ambulatory Visit (INDEPENDENT_AMBULATORY_CARE_PROVIDER_SITE_OTHER): Payer: Medicare Other | Admitting: Pulmonary Disease

## 2016-01-12 VITALS — BP 136/74 | HR 94 | Ht 64.0 in | Wt 184.0 lb

## 2016-01-12 DIAGNOSIS — J449 Chronic obstructive pulmonary disease, unspecified: Secondary | ICD-10-CM

## 2016-01-12 DIAGNOSIS — R0600 Dyspnea, unspecified: Secondary | ICD-10-CM

## 2016-01-12 MED ORDER — UMECLIDINIUM BROMIDE 62.5 MCG/INH IN AEPB: 1.0000 | INHALATION_SPRAY | Freq: Every day | RESPIRATORY_TRACT | Status: AC

## 2016-01-12 MED ORDER — UMECLIDINIUM BROMIDE 62.5 MCG/INH IN AEPB: 1.0000 | INHALATION_SPRAY | Freq: Every day | RESPIRATORY_TRACT | Status: DC

## 2016-01-12 NOTE — Patient Instructions (Addendum)
Continue Breo Continue prednisone @ 5 mg per day Continue albuterol inhaler as needed Trial of Incruse inhaler - sample and rx   Follow up in 4-5 weeks with repeat lung function measurements

## 2016-01-13 NOTE — Progress Notes (Signed)
Pt profile: Megan Bradshaw recently moved from [place]. Initially seen by Dr Melvyn Novas. Diagnosed with COPD in 2011. Graded as Gold I by Dr Melvyn Novas. Chronic prednisone therapy  ROV 08/18/15: IMPRESSION: 1) Mild COPD, Gold's I 2) Obesity with recent wt gain 3) Chronic prednisone therapy 4) Dyspnea - likely due to obesity > COPD  PLAN:  Continue Breo and albuterol inhaler as previously Change prednisone to 5 mg every other day Follow up in 3 months - goal 10 pound weight loss between now and then  ROV 11/23/15: IMPRESSION: 1) Mild COPD, Gold's I with asthmatic component  2) acute asthmatic bronchitis 3) Chronic prednisone therapy 4) Obesity - has lost 9 # since last visit. I have congratulated her on this success 5) Chronic dyspnea - likely due to obesity > COPD. Therefore, need to continue to work towards getting her off chronic prednisone if possible PLAN/RECS:  Stop Augmentin Doxycycline 100 mg twice a day for 7 days Complete prednisone as prescribed  By Dr Danise Mina office, then back to prednisone 5 mg daily until seen again Continue Breo and PRN albuterol inhaler as previously Sample of Spiriva provided  ROV 12/02/15 (Dr Mortimer Fries): 1.prednisone 50 mg daily for 10 days 2.albuterol nebs every 4 hrs 3.atrovent nasal sprays 4.tussionex as needed  ROV 12/13/15: IMPRESSION: 1) Mild COPD, with asthmatic component  2) acute asthmatic bronchitis, resolved 3) Chronic prednisone therapy - 2.5 mg daily 4) Obesity - has lost 9 # since last visit. I have congratulated her on this success 5) Chronic dyspnea  6) chronic rhinosinusitis  PLAN/RECS:  Cont prednisone 2.5 mg daily   Eventually hope to taper this to off Continue Breo, Spiriva and PRN albuterol inhaler as previously DC nasal ipratropium Nasacort - 2 sprays per nostril daily Follow up 4 weeks  ROV 02/Megan/17 Persistent DOE - disabling. Very frustrated. Recent death of close friend and family member has caused stress/depression. Using albuterol 1-4  times per day. Not using Spiriva as previously prescribed. Scattered wheezing on exam. No desaturation on ambulatory oximetry  Subj: Persistent DOE - disabling. Very frustrated. Recent death of close friend and family member has caused stress/depression. Denies purulent sputum, hemoptysis, LE edema, fever. Using albuterol 1-4 times per day. Not using Spiriva as previously prescribed. No desaturation on ambulatory oximetry Dr Zoila Shutter note reviewed. Respiratory status back to baseline. Still with nasal congestion and now with dry nose  Obj: Filed Vitals:   02/Megan/17 1030  BP: 136/74  Pulse: 94  Height: 5\' 4"  (1.626 m)  Weight: 184 lb (83.462 kg)  SpO2: 94%   Gen: depressed affect, NAD HEENT: WNL Neck: No JVD Chest: coarse BS throughout, scattered wheezes Cardiac: Reg, no M [id number], soft, NT, +BS Ext: No C/C//E  BMET    Component Value Date/Time   NA 133* 12/18/2015 1419   K 3.4* 12/18/2015 1419   CL 97 12/18/2015 1419   CO2 24 12/18/2015 1419   GLUCOSE 252* 12/18/2015 1419   BUN 30* 12/18/2015 1419   CREATININE 1.13 12/18/2015 1419   CALCIUM 9.2 12/18/2015 1419    CBC    Component Value Date/Time   WBC 10.8* 12/18/2015 1419   RBC 5.08 12/18/2015 1419   HGB 15.0 12/18/2015 1419   HCT 46.1* 12/18/2015 1419   PLT 245.0 12/18/2015 1419   MCV 90.7 12/18/2015 1419   MCHC 32.6 12/18/2015 1419   RDW 13.2 12/18/2015 1419    CXR: No new film   IMPRESSION: 1) Mild COPD, with persistent 2) Chronic prednisone therapy -  2.5 mg daily 3) Obesity, mild  4) Disabling dyspnea out of proportion to objective findings  PLAN/RECS:  Continue Breo Increase prednisone from 2.5 to 5 mg per day Continue albuterol inhaler as needed Trial of Incruse inhaler - sample and rx   Follow up in 4-5 weeks with repeat lung function measurements   Merton Border, MD PCCM service Mobile [phone] Pager [phone]

## 2016-01-19 ENCOUNTER — Encounter: Payer: Self-pay | Admitting: Internal Medicine

## 2016-01-19 ENCOUNTER — Ambulatory Visit (INDEPENDENT_AMBULATORY_CARE_PROVIDER_SITE_OTHER): Payer: Medicare Other | Admitting: Internal Medicine

## 2016-01-19 VITALS — BP 128/82 | HR 96 | Temp 97.7°F | Wt 182.5 lb

## 2016-01-19 DIAGNOSIS — J309 Allergic rhinitis, unspecified: Secondary | ICD-10-CM

## 2016-01-19 DIAGNOSIS — J449 Chronic obstructive pulmonary disease, unspecified: Secondary | ICD-10-CM

## 2016-01-19 NOTE — Progress Notes (Signed)
Pre visit review using our clinic review tool, if applicable. No additional management support is needed unless otherwise documented below in the visit note. 

## 2016-01-19 NOTE — Progress Notes (Signed)
HPI  Pt presents to the clinic today with c/o ongoing runny nose, cough,  chest congestion and shortness of breath. This has been going on for months. She has a history of asthma and COPD. She is UTD on flu and pneumonia vaccines. She did just see her pulmonologist 2/28. He felt that her objective findings were inconsistent with her subjective concerns. He increased her Prednisone back up to 5 mg daily. He advised her to continue Breo and Albuterol. He also gave her samples of Incruse to try. He wanted to see her back in 1 month to repeat spirometry. They did change her Ipatropium to Nasocort, which she feels like is working okay. She also takes Zyrtec daily. She would like a referral to ENT.  Review of Systems      Past Medical History  Diagnosis Date  . Diabetes ([place])   . Asthma   . Thyroid disease   . COPD (chronic obstructive pulmonary disease) ([place])   . Arthritis     Family History  Problem Relation Age of Onset  . Arthritis Father   . Diabetes Sister   . Diabetes Daughter   . Cancer Neg Hx   . Heart disease Neg Hx   . Stroke Neg Hx     Social History   Social History  . Marital Status: Unknown    Spouse Name: N/A  . Number of Children: N/A  . Years of Education: N/A   Occupational History  . Retired    Social History Main Topics  . Smoking status: Former Smoker -- 1.50 packs/day for 30 years    Types: Cigarettes    Quit date: 11/15/1987  . Smokeless tobacco: Never Used  . Alcohol Use: No  . Drug Use: No  . Sexual Activity: Not on file   Other Topics Concern  . Not on file   Social History Narrative    No Known Allergies   Constitutional: Positive fatigue. Denies headache, fever or abrupt weight changes.  HEENT:  Positive runny nose. Denies eye redness, eye pain, pressure behind the eyes, facial pain, nasal congestion, ear pain, ringing in the ears, wax buildup or sore throat. Respiratory: Positive cough and shortness of breath. Denies difficulty  breathing or shortness of breath.  Cardiovascular: Denies chest pain, chest tightness, palpitations or swelling in the hands or feet.   No other specific complaints in a complete review of systems (except as listed in HPI above).  Objective:   BP 128/82 mmHg  Pulse 96  Temp(Src) 97.7 F (36.5 C) (Oral)  Wt 182 lb 8 oz (82.781 kg)  SpO2 98%  Wt Readings from Last 3 Encounters:  01/12/16 184 lb (83.462 kg)  12/11/15 194 lb (87.998 kg)  12/02/15 195 lb 6.4 oz (88.633 kg)     General: Appears her stated age, chronically ill appearing in NAD. HEENT: Head: normal shape and size, no sinus tenderness noted; Eyes: sclera white, no icterus, conjunctiva pink; Throat/Mouth: Teeth present, mucosa pink and moist, no exudate noted, no lesions or ulcerations noted.  Neck: No cervical lymphadenopathy.  Cardiovascular: Normal rate and rhythm. S1,S2 noted.  No murmur, rubs or gallops noted.  Pulmonary/Chest: Normal effort and positive vesicular breath sounds. No respiratory distress. No wheezes, rales or ronchi noted.      Assessment & Plan:   COPD and Allergic Rhinitis:  Continue inhalers as prescribed by pulmonology Continue to work on weight loss Continue Zyrtec and Nasocort Referral to ENT- see Rosaria Ferries on the way out to schedule  RTC as needed or if symptoms persist.

## 2016-01-19 NOTE — Patient Instructions (Signed)

## 2016-02-04 DIAGNOSIS — J329 Chronic sinusitis, unspecified: Secondary | ICD-10-CM | POA: Diagnosis not present

## 2016-02-04 DIAGNOSIS — J301 Allergic rhinitis due to pollen: Secondary | ICD-10-CM | POA: Diagnosis not present

## 2016-02-16 ENCOUNTER — Ambulatory Visit (INDEPENDENT_AMBULATORY_CARE_PROVIDER_SITE_OTHER): Payer: Medicare Other | Admitting: *Deleted

## 2016-02-16 DIAGNOSIS — R0600 Dyspnea, unspecified: Secondary | ICD-10-CM | POA: Diagnosis not present

## 2016-02-16 LAB — PULMONARY FUNCTION TEST
DL/VA % PRED: 48 %
DL/VA: 2.32 ml/min/mmHg/L
DLCO UNC % PRED: 78 %
DLCO UNC: 19.08 ml/min/mmHg
FEF 25-75 Post: 0.76 L/sec
FEF 25-75 Pre: 0.58 L/sec
FEF2575-%Change-Post: 31 %
FEF2575-%Pred-Post: 61 %
FEF2575-%Pred-Pre: 47 %
FEV1-%Change-Post: 9 %
FEV1-%Pred-Post: 79 %
FEV1-%Pred-Pre: 71 %
FEV1-Post: 1.43 L
FEV1-Pre: 1.3 L
FEV1FVC-%Change-Post: 1 %
FEV1FVC-%Pred-Pre: 80 %
FEV6-%Change-Post: 6 %
FEV6-%PRED-PRE: 95 %
FEV6-%Pred-Post: 101 %
FEV6-POST: 2.35 L
FEV6-Pre: 2.21 L
FEV6FVC-%Change-Post: -1 %
FEV6FVC-%PRED-POST: 104 %
FEV6FVC-%PRED-PRE: 105 %
FVC-%Change-Post: 7 %
FVC-%PRED-PRE: 90 %
FVC-%Pred-Post: 97 %
FVC-POST: 2.4 L
FVC-PRE: 2.23 L
POST FEV6/FVC RATIO: 98 %
PRE FEV1/FVC RATIO: 58 %
PRE FEV6/FVC RATIO: 99 %
Post FEV1/FVC ratio: 60 %

## 2016-02-16 NOTE — Progress Notes (Signed)
PFT performed today with Nitrogen Washout.

## 2016-02-18 ENCOUNTER — Ambulatory Visit (INDEPENDENT_AMBULATORY_CARE_PROVIDER_SITE_OTHER): Payer: Medicare Other | Admitting: Pulmonary Disease

## 2016-02-18 ENCOUNTER — Encounter: Payer: Self-pay | Admitting: Pulmonary Disease

## 2016-02-18 VITALS — BP 126/78 | HR 77 | Ht 64.0 in | Wt 184.6 lb

## 2016-02-18 DIAGNOSIS — J45909 Unspecified asthma, uncomplicated: Secondary | ICD-10-CM

## 2016-02-18 DIAGNOSIS — J449 Chronic obstructive pulmonary disease, unspecified: Secondary | ICD-10-CM | POA: Diagnosis not present

## 2016-02-19 NOTE — Progress Notes (Signed)
Pt profile: 37 F recently moved from [place]. Initially seen by Dr Melvyn Novas. Diagnosed with COPD in 2011. Graded as Gold I by Dr Melvyn Novas. Chronic prednisone therapy  ROV 08/18/15: IMPRESSION: 1) Mild COPD, Gold's I 2) Obesity with recent wt gain 3) Chronic prednisone therapy 4) Dyspnea - likely due to obesity > COPD  PLAN:  Continue Breo and albuterol inhaler as previously Change prednisone to 5 mg every other day Follow up in 3 months - goal 10 pound weight loss between now and then  ROV 11/23/15: IMPRESSION: 1) Mild COPD, Gold's I with asthmatic component  2) acute asthmatic bronchitis 3) Chronic prednisone therapy 4) Obesity - lost 9 # 5) Chronic dyspnea - likely due to obesity > COPD. Therefore, need to continue to work towards getting her off chronic prednisone if possible PLAN/RECS:  Stop Augmentin Doxycycline 100 mg twice a day for 7 days Complete prednisone as prescribed  By Dr Danise Mina office, then back to prednisone 5 mg daily until seen again Continue Breo and PRN albuterol inhaler as previously Sample of Spiriva provided  ROV 12/02/15 (Dr Mortimer Fries): 1.prednisone 50 mg daily for 10 days 2.albuterol nebs every 4 hrs 3.atrovent nasal sprays 4.tussionex as needed  ROV 12/13/15: 1) Mild COPD, with asthmatic component  2) acute asthmatic bronchitis, resolved 3) Chronic prednisone therapy - 2.5 mg daily 4) Obesity 5) Chronic dyspnea  6) chronic rhinosinusitis  PLAN/RECS:  Cont prednisone 2.5 mg daily   Eventually hope to taper this to off Continue Breo, Spiriva and PRN albuterol inhaler as previously DC nasal ipratropium Nasacort - 2 sprays per nostril daily Follow up 4 weeks  ROV 01/12/16 Persistent DOE - disabling. Very frustrated. Recent death of close friend and family member has caused stress/depression. Using albuterol 1-4 times per day. Not using Spiriva as previously prescribed. Scattered wheezing on exam. No desaturation on ambulatory oximetry  ROV 02/18/16:  At  baseline. Only complaint is dry cough. Continued on prednisone 5 mg qod and Breo. ROV 3 months  Subj: Overall stable without new complaints. Not using Incruse - did not help. Remains with DOE but seems to be at baseline. Reports dry cough. Denies CP, fever, purulent sputum, hemoptysis, LE edema and calf tenderness   Obj: Filed Vitals:   02/18/16 1211  BP: 126/78  Pulse: 77  Height: 5\' 4"  (1.626 m)  Weight: 184 lb 9.6 oz (83.734 kg)  SpO2: 97%   Gen: depressed affect, NAD HEENT: WNL Neck: No JVD Chest: slightly coarse BS, no wheezes Cardiac: Reg, no M [id number], soft, NT, +BS Ext: No C/C//E  BMET    Component Value Date/Time   NA 133* 12/18/2015 1419   K 3.4* 12/18/2015 1419   CL 97 12/18/2015 1419   CO2 24 12/18/2015 1419   GLUCOSE 252* 12/18/2015 1419   BUN 30* 12/18/2015 1419   CREATININE 1.13 12/18/2015 1419   CALCIUM 9.2 12/18/2015 1419    CBC    Component Value Date/Time   WBC 10.8* 12/18/2015 1419   RBC 5.08 12/18/2015 1419   HGB 15.0 12/18/2015 1419   HCT 46.1* 12/18/2015 1419   PLT 245.0 12/18/2015 1419   MCV 90.7 12/18/2015 1419   MCHC 32.6 12/18/2015 1419   RDW 13.2 12/18/2015 1419    CXR: No new film   IMPRESSION: 1) Mild COPD, with persistent dyspnea out of proportion 2) Chronic prednisone therapy - 5 mg q.o.d. 3) Obesity, mild   PLAN/RECS:  Continue Breo Cont prednisone 5 mg q.o.d Continue albuterol inhaler as needed  ROV 3 months - consider DC of prednisone @ that time   Merton Border, MD PCCM service Mobile [phone] Pager [phone]

## 2016-03-20 ENCOUNTER — Other Ambulatory Visit: Payer: Self-pay | Admitting: Internal Medicine

## 2016-04-06 ENCOUNTER — Other Ambulatory Visit: Payer: Self-pay

## 2016-04-06 MED ORDER — FLUTICASONE FUROATE-VILANTEROL 100-25 MCG/INH IN AEPB: 1.0000 | INHALATION_SPRAY | Freq: Every morning | RESPIRATORY_TRACT | Status: DC

## 2016-04-07 ENCOUNTER — Other Ambulatory Visit: Payer: Self-pay | Admitting: Internal Medicine

## 2016-05-23 ENCOUNTER — Encounter: Payer: Self-pay | Admitting: Internal Medicine

## 2016-05-23 ENCOUNTER — Ambulatory Visit (INDEPENDENT_AMBULATORY_CARE_PROVIDER_SITE_OTHER): Payer: Medicare Other | Admitting: Internal Medicine

## 2016-05-23 VITALS — BP 130/78 | HR 102 | Temp 98.6°F

## 2016-05-23 DIAGNOSIS — J411 Mucopurulent chronic bronchitis: Secondary | ICD-10-CM | POA: Diagnosis not present

## 2016-05-23 DIAGNOSIS — B029 Zoster without complications: Secondary | ICD-10-CM

## 2016-05-23 DIAGNOSIS — R0602 Shortness of breath: Secondary | ICD-10-CM | POA: Diagnosis not present

## 2016-05-23 DIAGNOSIS — M545 Low back pain, unspecified: Secondary | ICD-10-CM

## 2016-05-23 MED ORDER — VALACYCLOVIR HCL 1 G PO TABS: 1000.0000 mg | ORAL_TABLET | Freq: Three times a day (TID) | ORAL | Status: DC

## 2016-05-23 MED ORDER — GABAPENTIN 100 MG PO CAPS
100.0000 mg | ORAL_CAPSULE | Freq: Three times a day (TID) | ORAL | Status: DC
Start: 2016-05-23 — End: 2016-06-12

## 2016-05-23 NOTE — Progress Notes (Signed)
Pre visit review using our clinic review tool, if applicable. No additional management support is needed unless otherwise documented below in the visit note. 

## 2016-05-23 NOTE — Progress Notes (Signed)
Subjective:    Patient ID: Megan Bradshaw, female    DOB: [birth year], 82 y.o.   MRN: [id number]  HPI  Pt presents to the clinic today with c/o low back pain. She reports this started 2 days ago. The pain is in her lower back. She describes the pain as achy. The pain can be sharp and stabbing with certain movements. The pain radiates to both sides and into her right hip. She denies loss of bowel or bladder. She has been doing water aerobics 5 days a week for the last 3 weeks but denies any injury to the area. She has tried Meloxicam and Ibuprofen (not taken together) without any relief.   She also c/o shortness of breath and chills. This started yesterday. She denies chest pain or cough. She has a history of COPD and is on Breo, Atrovent and Albuterol. She did take a dose of Prednisone yesterday and reported some improvement.  Review of Systems      Past Medical History  Diagnosis Date  . Diabetes ([place])   . Asthma   . Thyroid disease   . COPD (chronic obstructive pulmonary disease) ([place])   . Arthritis     Current Outpatient Prescriptions  Medication Sig Dispense Refill  . albuterol (PROVENTIL) (2.5 MG/3ML) 0.083% nebulizer solution Take 3 mLs (2.5 mg total) by nebulization every 4 (four) hours as needed for wheezing or shortness of breath. 75 mL 12  . albuterol (VENTOLIN HFA) 108 (90 BASE) MCG/ACT inhaler Inhale 1-2 puffs into the lungs every 6 (six) hours as needed for wheezing or shortness of breath. 1 Inhaler 2  . cetirizine (ZYRTEC) 10 MG tablet Take 10 mg by mouth daily.    . Cholecalciferol (VITAMIN D PO) Take 1 tablet by mouth daily.    . fluticasone furoate-vilanterol (BREO ELLIPTA) 100-25 MCG/INH AEPB Inhale 1 puff into the lungs every morning. 180 each 2  . GLIPIZIDE XL 5 MG 24 hr tablet TAKE 1 TABLET BY MOUTH EVERY MORNING WITH BREAKFAST 90 tablet 0  . ipratropium (ATROVENT) 0.03 % nasal spray Place 2 sprays into the nose 4 (four) times daily. 30 mL 1  . ketoconazole  (NIZORAL) 2 % cream APPLY EXTERNALLY TO THE AFFECTED AREA DAILY 15 g 0  . levothyroxine (SYNTHROID, LEVOTHROID) 75 MCG tablet Take 1 tablet (75 mcg total) by mouth daily before breakfast. MUST SCHEDULE ANNUAL EXAM 90 tablet 0  . meloxicam (MOBIC) 15 MG tablet Take 1 tablet (15 mg total) by mouth every other day. 15 tablet 2  . metFORMIN (GLUCOPHAGE) 500 MG tablet TAKE 1 TABLET BY MOUTH TWICE DAILY WITH A MEAL 180 tablet 0  . Multiple Vitamin (MULTIVITAMIN) capsule Take 1 capsule by mouth daily.    Marland Kitchen oxybutynin (DITROPAN-XL) 5 MG 24 hr tablet Take 1 tablet (5 mg total) by mouth at bedtime. 30 tablet 2  . predniSONE (DELTASONE) 5 MG tablet Take 1 tablet (5 mg total) by mouth daily. (Patient taking differently: Take 5 mg by mouth every other day. ) 90 tablet 1  . triamcinolone (NASACORT AQ) 55 MCG/ACT AERO nasal inhaler Place 2 sprays into the nose daily. 1 Inhaler 12   No current facility-administered medications for this visit.    No Known Allergies  Family History  Problem Relation Age of Onset  . Arthritis Father   . Diabetes Sister   . Diabetes Daughter   . Cancer Neg Hx   . Heart disease Neg Hx   . Stroke Neg Hx  Social History   Social History  . Marital Status: Unknown    Spouse Name: N/A  . Number of Children: N/A  . Years of Education: N/A   Occupational History  . Retired    Social History Main Topics  . Smoking status: Former Smoker -- 1.50 packs/day for 30 years    Types: Cigarettes    Quit date: 11/15/1987  . Smokeless tobacco: Never Used  . Alcohol Use: No  . Drug Use: No  . Sexual Activity: Not on file   Other Topics Concern  . Not on file   Social History Narrative     Constitutional: Denies fever, malaise, fatigue, headache or abrupt weight changes.  HEENT: Denies eye pain, eye redness, ear pain, ringing in the ears, wax buildup, runny nose, nasal congestion, bloody nose, or sore throat. Respiratory: Pt reports shortness of breath. Denies  difficulty breathing, cough or sputum production.   Cardiovascular: Denies chest pain, chest tightness, palpitations or swelling in the hands or feet.  Gastrointestinal: Denies abdominal pain, bloating, constipation, diarrhea or blood in the stool.  GU: Denies urgency, frequency, pain with urination, burning sensation, blood in urine, odor or discharge. Musculoskeletal: Pt reports back pain. Denies decrease in range of motion, difficulty with gait, or joint swelling.    No other specific complaints in a complete review of systems (except as listed in HPI above).  Objective:   Physical Exam   BP 130/78 mmHg  Pulse 102  Temp(Src) 98.6 F (37 C) (Oral)  Wt   SpO2 96% Wt Readings from Last 3 Encounters:  02/18/16 184 lb 9.6 oz (83.734 kg)  01/19/16 182 lb 8 oz (82.781 kg)  01/12/16 184 lb (83.462 kg)    General: Appears her stated age, obese in NAD. Skin: Vesicular rash on erythematous base noted starting at midline, extending around right flank. HEENT: Head: normal shape and size; Ears: Tm's gray and intact, normal light reflex; Throat/Mouth: Teeth present, mucosa pink and moist, no exudate, lesions or ulcerations noted.  Neck:  No adenopathy noted. Cardiovascular: Normal rate and rhythm. S1,S2 noted.  No murmur, rubs or gallops noted.  Pulmonary/Chest: Normal effort and positive vesicular breath sounds. No respiratory distress. No wheezes, rales or ronchi noted.  Musculoskeletal: Unable to do ROM, in wheelchair.  BMET    Component Value Date/Time   NA 133* 12/18/2015 1419   K 3.4* 12/18/2015 1419   CL 97 12/18/2015 1419   CO2 24 12/18/2015 1419   GLUCOSE 252* 12/18/2015 1419   BUN 30* 12/18/2015 1419   CREATININE 1.13 12/18/2015 1419   CALCIUM 9.2 12/18/2015 1419    Lipid Panel     Component Value Date/Time   CHOL 178 03/23/2015 1052   TRIG 146.0 03/23/2015 1052   HDL 62.10 03/23/2015 1052   CHOLHDL 3 03/23/2015 1052   VLDL 29.2 03/23/2015 1052   LDLCALC 87  03/23/2015 1052    CBC    Component Value Date/Time   WBC 10.8* 12/18/2015 1419   RBC 5.08 12/18/2015 1419   HGB 15.0 12/18/2015 1419   HCT 46.1* 12/18/2015 1419   PLT 245.0 12/18/2015 1419   MCV 90.7 12/18/2015 1419   MCHC 32.6 12/18/2015 1419   RDW 13.2 12/18/2015 1419    Hgb A1C Lab Results  Component Value Date   HGBA1C 10.2* 12/18/2015        Assessment & Plan:   Low back pain secondary to shingles:  eRx for Valtrex 1 gm TID x 7 days eRx for Neurontin  100 mgTID x 10 days She has already had the shingles vaccine  Shortness of breath secondary to COPD:  Continue inhalers as prescribed Continue Prednisone as prescribed Follow up with pulmonology if worsens  RTC as needed or if symptoms persistior worsen Webb Silversmith, NP

## 2016-05-23 NOTE — Patient Instructions (Signed)

## 2016-05-24 ENCOUNTER — Telehealth: Payer: Self-pay | Admitting: Internal Medicine

## 2016-05-24 NOTE — Telephone Encounter (Signed)
Patient's daughter would like a call back from the PCP about the patient's office visit yesterday for shingles. Mom is in a lot of pain.

## 2016-05-24 NOTE — Telephone Encounter (Signed)
Left message on voicemail.

## 2016-05-24 NOTE — Telephone Encounter (Signed)
We can give her some Vicodin to take for severe pain if she would like to come get RX

## 2016-05-25 ENCOUNTER — Other Ambulatory Visit: Payer: Self-pay | Admitting: Internal Medicine

## 2016-05-25 MED ORDER — HYDROCODONE-ACETAMINOPHEN 5-325 MG PO TABS: 1.0000 | ORAL_TABLET | Freq: Four times a day (QID) | ORAL | Status: DC | PRN

## 2016-05-25 NOTE — Telephone Encounter (Signed)
Spoke to pt's daughter---she reports pt did not get much sleep through the night and does need additional medication for pain---would like to pick up Vicodin after 12pm--once printed will place in front office for pt's daughter to pick up Rx

## 2016-05-25 NOTE — Telephone Encounter (Signed)
RX printed and signed and placed in MYD box 

## 2016-05-30 ENCOUNTER — Telehealth: Payer: Self-pay

## 2016-05-30 DIAGNOSIS — B029 Zoster without complications: Secondary | ICD-10-CM

## 2016-05-30 NOTE — Telephone Encounter (Signed)
V/m was left/ pt was seen 05/23/16; pt is running out of med for shingles and wants to know if should refill the 3 meds or does pt need to be rechecked.Please advise.

## 2016-05-31 MED ORDER — HYDROCODONE-ACETAMINOPHEN 5-325 MG PO TABS: 1.0000 | ORAL_TABLET | Freq: Four times a day (QID) | ORAL | Status: DC | PRN

## 2016-05-31 NOTE — Telephone Encounter (Signed)
No more Valtrex. Ok to refill Neurontin and Hydrocodone

## 2016-05-31 NOTE — Addendum Note (Signed)
Addended by: Lurlean Nanny on: 05/31/2016 01:38 PM   Modules accepted: Orders

## 2016-05-31 NOTE — Telephone Encounter (Signed)
There are refills on gabapentin--Rx for Norco printed and will place in front office for pt's daughter to pick up this afternoon

## 2016-05-31 NOTE — Telephone Encounter (Signed)
Spoke to pt and she reports that she has noticed improvements in rash and pain on one side but is still concerned as she still is having great discomfort. Pt wants to know if refill on Valtrex is needed and she may need more pain meds---please advise

## 2016-05-31 NOTE — Telephone Encounter (Signed)
She should not need any more of the Valtrex. Is she still having pain?

## 2016-06-07 ENCOUNTER — Ambulatory Visit: Payer: Medicare Other | Admitting: Internal Medicine

## 2016-06-08 ENCOUNTER — Other Ambulatory Visit: Payer: Self-pay

## 2016-06-08 DIAGNOSIS — B029 Zoster without complications: Secondary | ICD-10-CM

## 2016-06-08 MED ORDER — HYDROCODONE-ACETAMINOPHEN 5-325 MG PO TABS: 1.0000 | ORAL_TABLET | Freq: Four times a day (QID) | ORAL | 0 refills | Status: DC | PRN

## 2016-06-08 NOTE — Telephone Encounter (Signed)
Pt left v/m requesting rx hydrocodone apap. Call when ready for pick up. rx last printed # 30 on 05/31/16; pt seen 05/23/16. Pt is having to take q 6 h due to shingles pain.

## 2016-06-08 NOTE — Telephone Encounter (Signed)
Rx left in front office for pick up and pt is aware  

## 2016-06-08 NOTE — Telephone Encounter (Signed)
RX printed and signed and placed in MYD box 

## 2016-06-12 ENCOUNTER — Other Ambulatory Visit: Payer: Self-pay | Admitting: Internal Medicine

## 2016-06-12 DIAGNOSIS — B029 Zoster without complications: Secondary | ICD-10-CM

## 2016-06-17 ENCOUNTER — Other Ambulatory Visit: Payer: Self-pay

## 2016-06-17 DIAGNOSIS — B029 Zoster without complications: Secondary | ICD-10-CM

## 2016-06-17 MED ORDER — HYDROCODONE-ACETAMINOPHEN 5-325 MG PO TABS: 1.0000 | ORAL_TABLET | Freq: Four times a day (QID) | ORAL | 0 refills | Status: DC | PRN

## 2016-06-17 NOTE — Telephone Encounter (Signed)
Pt's daughter is aware---ok per HIPPA

## 2016-06-17 NOTE — Telephone Encounter (Signed)
RX printed and signed and placed in MYD box 

## 2016-06-17 NOTE — Telephone Encounter (Signed)
Pt left v/m requesting rx hydrocodone apap. Call when ready for pick up. Pt having pain due to shingles.last printed # 30 on 06/08/16. Pt last seen 05/23/16.

## 2016-06-23 ENCOUNTER — Ambulatory Visit (INDEPENDENT_AMBULATORY_CARE_PROVIDER_SITE_OTHER): Payer: Medicare Other | Admitting: Internal Medicine

## 2016-06-23 ENCOUNTER — Encounter: Payer: Self-pay | Admitting: Internal Medicine

## 2016-06-23 VITALS — BP 128/82 | HR 74 | Temp 97.9°F | Wt 179.0 lb

## 2016-06-23 DIAGNOSIS — E099 Drug or chemical induced diabetes mellitus without complications: Secondary | ICD-10-CM

## 2016-06-23 DIAGNOSIS — B0229 Other postherpetic nervous system involvement: Secondary | ICD-10-CM | POA: Diagnosis not present

## 2016-06-23 DIAGNOSIS — E039 Hypothyroidism, unspecified: Secondary | ICD-10-CM

## 2016-06-23 DIAGNOSIS — T380X5A Adverse effect of glucocorticoids and synthetic analogues, initial encounter: Secondary | ICD-10-CM | POA: Diagnosis not present

## 2016-06-23 MED ORDER — GABAPENTIN 100 MG PO CAPS: 200.0000 mg | ORAL_CAPSULE | Freq: Three times a day (TID) | ORAL | 0 refills | Status: DC

## 2016-06-23 MED ORDER — HYDROCODONE-ACETAMINOPHEN 5-325 MG PO TABS: 1.0000 | ORAL_TABLET | Freq: Three times a day (TID) | ORAL | 0 refills | Status: DC | PRN

## 2016-06-23 MED ORDER — LEVOTHYROXINE SODIUM 75 MCG PO TABS: 75.0000 ug | ORAL_TABLET | Freq: Every day | ORAL | 0 refills | Status: DC

## 2016-06-23 MED ORDER — METFORMIN HCL 500 MG PO TABS: 500.0000 mg | ORAL_TABLET | Freq: Every day | ORAL | 0 refills | Status: DC

## 2016-06-23 NOTE — Patient Instructions (Signed)
Postherpetic Neuralgia   Postherpetic neuralgia (PHN) is nerve pain that occurs after a shingles infection. Shingles is a painful rash that appears on one side of the body, usually on your trunk or face. Shingles is caused by the varicella-zoster virus. This is the same virus that causes chickenpox. In people who have had chickenpox, the virus can resurface years later and cause shingles.   You may have PHN if you continue to have pain for 3 months after your shingles rash has gone away. PHN appears in the same area where you had the shingles rash. For most people, PHN goes away within 1 year.   Getting a vaccination for shingles can prevent PHN. This vaccine is recommended for people older than 50. It may prevent shingles and may also lower your risk of PHN if you do get shingles.   CAUSES   PHN is caused by damage to your nerves from the varicella-zoster virus. This damage makes your nerves overly sensitive.   RISK FACTORS   Aging is the biggest risk factor for developing PHN. Most people who get PHN are older than 60. Other risk factors include:   Having very bad pain before your shingles rash starts.   Having a very bad rash.   Having shingles in the nerve that supplies your face and eye (trigeminal nerve).  SIGNS AND SYMPTOMS   Pain is the main symptom of PHN. The pain is often very bad and may be described as stabbing, burning, or feeling like an electric shock. The pain may come and go or may be there all the time. Pain may be triggered by light touches on the skin or changes in temperature. You may have itching along with the pain.   DIAGNOSIS   Your health care provider may diagnose PHN based on your symptoms and your history of shingles. Lab studies and other diagnostic tests are usually not needed.   TREATMENT   There is no cure for PHN. Treatment for PHN will focus on pain relief. Over-the-counter pain relievers do not usually relieve PHN pain. You may need to work with a pain specialist. Treatment may  include:   Antidepressant medicines to help with pain and improve sleep.   Antiseizure medicines to relieve nerve pain.   Strong pain relievers (opioids).   A numbing patch worn on the skin (lidocaine patch).  HOME CARE INSTRUCTIONS   It may take a long time to recover from PHN. Work closely with your health care provider, and have a good support system at home.   Take all medicines as directed by your health care provider.   Wear loose, comfortable clothing.   Cover sensitive areas with a dressing to reduce friction from clothing rubbing on the area.   If cold does not make your pain worse, try applying a cool compress or cooling gel pack to the area.   Talk to your health care provider if you feel depressed or desperate. Living with long-term pain can be depressing.  SEEK MEDICAL CARE IF:   Your medicine is not helping.   You are struggling to manage your pain at home.  This information is not intended to replace advice given to you by your health care provider. Make sure you discuss any questions you have with your health care provider.   Document Released: 01/21/2003 Document Revised: 11/21/2014 Document Reviewed: 10/22/2013   Elsevier Interactive Patient Education 2016 [employer] Inc[employer]

## 2016-06-23 NOTE — Progress Notes (Signed)
Subjective:    Patient ID: Megan Bradshaw, female    DOB: 12-11-1929, 82 y.o.   MRN: [id number]  HPI  Pt presents to the clinic today for a follow-up visit for shingles.  She was seen on 05/23/16 for a complaint of low back pain, and physical exam demonstrated a vesicular rash on the right lower back and flank.  She was prescribed Valtrex and Neurontin at that visit.  Her daughter called the office on 05/24/16 and reported the patient was experiencing severe pain, and she was prescribed Norco as needed for pain.  Today she reports she continues to have pain in her right lower back that radiates around to the right lower abdomen, and states the pain wakes her up in the morning.  She describes the pain as a burning/aching/throbbing at a severity of 8/10.  She reports her daughter looked at the rash yesterday and told her the rash has improved.  She states she has been wearing loose clothing, and taking the Norco and Neurontin once in the morning and once at night before bed.      She is also requesting refills of Metformin and Synthroid.         Review of Systems Past Medical History:  Diagnosis Date  . Arthritis   . Asthma   . COPD (chronic obstructive pulmonary disease) ([place])   . Diabetes ([place])   . Thyroid disease    Past Surgical History:  Procedure Laterality Date  . ABDOMINAL HYSTERECTOMY  1984  . APPENDECTOMY  1939   . CATARACT EXTRACTION, BILATERAL    . CHOLECYSTECTOMY  1987  . NASAL SINUS SURGERY  1990  . REPLACEMENT TOTAL KNEE Right 2007   Family History  Problem Relation Age of Onset  . Arthritis Father   . Diabetes Sister   . Diabetes Daughter   . Cancer Neg Hx   . Heart disease Neg Hx   . Stroke Neg Hx    Current Outpatient Prescriptions on File Prior to Visit  Medication Sig Dispense Refill  . albuterol (PROVENTIL) (2.5 MG/3ML) 0.083% nebulizer solution Take 3 mLs (2.5 mg total) by nebulization every 4 (four) hours as needed for wheezing or shortness of breath. 75  mL 12  . albuterol (VENTOLIN HFA) 108 (90 BASE) MCG/ACT inhaler Inhale 1-2 puffs into the lungs every 6 (six) hours as needed for wheezing or shortness of breath. 1 Inhaler 2  . cetirizine (ZYRTEC) 10 MG tablet Take 10 mg by mouth daily.    . Cholecalciferol (VITAMIN D PO) Take 1 tablet by mouth daily.    . fluticasone furoate-vilanterol (BREO ELLIPTA) 100-25 MCG/INH AEPB Inhale 1 puff into the lungs every morning. 180 each 2  . GLIPIZIDE XL 5 MG 24 hr tablet TAKE 1 TABLET BY MOUTH EVERY MORNING WITH BREAKFAST 90 tablet 0  . ipratropium (ATROVENT) 0.03 % nasal spray Place 2 sprays into the nose 4 (four) times daily. 30 mL 1  . ketoconazole (NIZORAL) 2 % cream APPLY EXTERNALLY TO THE AFFECTED AREA DAILY 15 g 0  . meloxicam (MOBIC) 15 MG tablet Take 1 tablet (15 mg total) by mouth every other day. 15 tablet 2  . Multiple Vitamin (MULTIVITAMIN) capsule Take 1 capsule by mouth daily.    Marland Kitchen oxybutynin (DITROPAN-XL) 5 MG 24 hr tablet Take 1 tablet (5 mg total) by mouth at bedtime. 30 tablet 2  . predniSONE (DELTASONE) 5 MG tablet Take 1 tablet (5 mg total) by mouth daily. (Patient taking differently: Take 5 mg  by mouth every other day. ) 90 tablet 1  . triamcinolone (NASACORT AQ) 55 MCG/ACT AERO nasal inhaler Place 2 sprays into the nose daily. 1 Inhaler 12   No current facility-administered medications on file prior to visit.    No Known Allergies  Skin: Pt reports painful rash.     Objective:   Physical Exam BP 128/82   Pulse 74   Temp 97.9 F (36.6 C) (Oral)   Wt 179 lb (81.2 kg)   SpO2 97%   BMI 30.73 kg/m   General: Well-appearing, in no acute distress. Skin: Hyperpigmented rash present right lower back starting at midline and extending around right flank to right lower abdomen, no vesicles or erythema noted.      Assessment & Plan:   Post-herpetic neuralgia:  Increased Neurontin to 200mg  TID  Refill Rx for Norco  Hypothyroidism:  Refill eRx for Synthroid  Diabetes:    Refill eRx for Metformin  Make an appt for your Medicare Wellness Exam Webb Silversmith, NP

## 2016-07-11 ENCOUNTER — Other Ambulatory Visit: Payer: Self-pay | Admitting: Internal Medicine

## 2016-07-21 ENCOUNTER — Ambulatory Visit: Payer: Medicare Other | Admitting: Internal Medicine

## 2016-07-22 ENCOUNTER — Encounter: Payer: Self-pay | Admitting: Internal Medicine

## 2016-07-22 ENCOUNTER — Ambulatory Visit (INDEPENDENT_AMBULATORY_CARE_PROVIDER_SITE_OTHER): Payer: Medicare Other | Admitting: Internal Medicine

## 2016-07-22 VITALS — BP 126/78 | HR 76 | Temp 98.7°F | Wt 180.0 lb

## 2016-07-22 DIAGNOSIS — R2689 Other abnormalities of gait and mobility: Secondary | ICD-10-CM

## 2016-07-22 DIAGNOSIS — Z23 Encounter for immunization: Secondary | ICD-10-CM

## 2016-07-22 DIAGNOSIS — B0229 Other postherpetic nervous system involvement: Secondary | ICD-10-CM | POA: Diagnosis not present

## 2016-07-22 DIAGNOSIS — R29818 Other symptoms and signs involving the nervous system: Secondary | ICD-10-CM

## 2016-07-22 MED ORDER — HYDROCODONE-ACETAMINOPHEN 5-325 MG PO TABS: 1.0000 | ORAL_TABLET | Freq: Three times a day (TID) | ORAL | 0 refills | Status: DC | PRN

## 2016-07-22 NOTE — Progress Notes (Signed)
Subjective:    Patient ID: Megan Bradshaw, female    DOB: [birth year], 82 y.o.   MRN: [id number]  HPI  Pt presents to the clinic today for a follow-up visit for PHN.  She reports she continues to have pain in her right lower back that radiates around to the right lower abdomen, and states the pain wakes her up in the morning.  She describes the pain as a burning/aching/throbbing at a severity of 8/10.The rash has resolved. She has been taking the Norco and Neurontin as prescribed but reports it makes her so sleepy during the day.     She is also reports balance issues. She is not sure if this is related to the Neurontin vs not being as active lately. She denies any recent falls.  Review of Systems Past Medical History:  Diagnosis Date  . Arthritis   . Asthma   . COPD (chronic obstructive pulmonary disease) ([place])   . Diabetes ([place])   . Thyroid disease    Past Surgical History:  Procedure Laterality Date  . ABDOMINAL HYSTERECTOMY  1984  . APPENDECTOMY  1939   . CATARACT EXTRACTION, BILATERAL    . CHOLECYSTECTOMY  1987  . NASAL SINUS SURGERY  1990  . REPLACEMENT TOTAL KNEE Right 2007   Family History  Problem Relation Age of Onset  . Arthritis Father   . Diabetes Sister   . Diabetes Daughter   . Cancer Neg Hx   . Heart disease Neg Hx   . Stroke Neg Hx    Current Outpatient Prescriptions on File Prior to Visit  Medication Sig Dispense Refill  . albuterol (PROVENTIL) (2.5 MG/3ML) 0.083% nebulizer solution Take 3 mLs (2.5 mg total) by nebulization every 4 (four) hours as needed for wheezing or shortness of breath. 75 mL 12  . albuterol (VENTOLIN HFA) 108 (90 BASE) MCG/ACT inhaler Inhale 1-2 puffs into the lungs every 6 (six) hours as needed for wheezing or shortness of breath. 1 Inhaler 2  . cetirizine (ZYRTEC) 10 MG tablet Take 10 mg by mouth daily.    . Cholecalciferol (VITAMIN D PO) Take 1 tablet by mouth daily.    . fluticasone furoate-vilanterol (BREO ELLIPTA) 100-25  MCG/INH AEPB Inhale 1 puff into the lungs every morning. 180 each 2  . gabapentin (NEURONTIN) 100 MG capsule Take 2 capsules (200 mg total) by mouth 3 (three) times daily. 180 capsule 0  . glipiZIDE (GLUCOTROL XL) 5 MG 24 hr tablet TAKE 1 TABLET BY MOUTH EVERY MORNING WITH BREAKFAST 90 tablet 0  . ipratropium (ATROVENT) 0.03 % nasal spray Place 2 sprays into the nose 4 (four) times daily. 30 mL 1  . ketoconazole (NIZORAL) 2 % cream APPLY EXTERNALLY TO THE AFFECTED AREA DAILY 15 g 0  . levothyroxine (SYNTHROID, LEVOTHROID) 75 MCG tablet Take 1 tablet (75 mcg total) by mouth daily before breakfast. MUST SCHEDULE ANNUAL EXAM 90 tablet 0  . meloxicam (MOBIC) 15 MG tablet Take 1 tablet (15 mg total) by mouth every other day. 15 tablet 2  . metFORMIN (GLUCOPHAGE) 500 MG tablet Take 1 tablet (500 mg total) by mouth daily with breakfast. 90 tablet 0  . Multiple Vitamin (MULTIVITAMIN) capsule Take 1 capsule by mouth daily.    Marland Kitchen oxybutynin (DITROPAN-XL) 5 MG 24 hr tablet Take 1 tablet (5 mg total) by mouth at bedtime. 30 tablet 2  . predniSONE (DELTASONE) 5 MG tablet Take 1 tablet (5 mg total) by mouth daily. (Patient taking differently: Take 5 mg by  mouth every other day. ) 90 tablet 1  . triamcinolone (NASACORT AQ) 55 MCG/ACT AERO nasal inhaler Place 2 sprays into the nose daily. 1 Inhaler 12   No current facility-administered medications on file prior to visit.    No Known Allergies  Neuro: Pt reports burning, numbness and tingling of the right lower back, radiating around to right groin.     Objective:   Physical Exam BP 126/78   Pulse 76   Temp 98.7 F (37.1 C) (Oral)   Wt 180 lb (81.6 kg)   SpO2 97%   BMI 30.90 kg/m   General: Well-appearing, in no acute distress. Skin: Scarring noted from shingles rash from right lower back around to right groin. MSK: Gait slow but steady. Neuro: Unable to walk on toes or heels. Difficulty with heel-toe walking.      Assessment & Plan:    Post-herpetic neuralgia:  She can try taking Neurontin only at night if it is causing too much sedation Discussed alternating Norco and Neurontin  Refill Rx for Norco today  Balance Issues:  She declines referral for PT at this time  Make an appt for your Medicare Wellness Exam Webb Silversmith, NP

## 2016-07-22 NOTE — Patient Instructions (Signed)
Postherpetic Neuralgia   Postherpetic neuralgia (PHN) is nerve pain that occurs after a shingles infection. Shingles is a painful rash that appears on one side of the body, usually on your trunk or face. Shingles is caused by the varicella-zoster virus. This is the same virus that causes chickenpox. In people who have had chickenpox, the virus can resurface years later and cause shingles.   You may have PHN if you continue to have pain for 3 months after your shingles rash has gone away. PHN appears in the same area where you had the shingles rash. For most people, PHN goes away within 1 year.   Getting a vaccination for shingles can prevent PHN. This vaccine is recommended for people older than 50. It may prevent shingles and may also lower your risk of PHN if you do get shingles.   CAUSES   PHN is caused by damage to your nerves from the varicella-zoster virus. This damage makes your nerves overly sensitive.   RISK FACTORS   Aging is the biggest risk factor for developing PHN. Most people who get PHN are older than 60. Other risk factors include:   Having very bad pain before your shingles rash starts.   Having a very bad rash.   Having shingles in the nerve that supplies your face and eye (trigeminal nerve).  SIGNS AND SYMPTOMS   Pain is the main symptom of PHN. The pain is often very bad and may be described as stabbing, burning, or feeling like an electric shock. The pain may come and go or may be there all the time. Pain may be triggered by light touches on the skin or changes in temperature. You may have itching along with the pain.   DIAGNOSIS   Your health care provider may diagnose PHN based on your symptoms and your history of shingles. Lab studies and other diagnostic tests are usually not needed.   TREATMENT   There is no cure for PHN. Treatment for PHN will focus on pain relief. Over-the-counter pain relievers do not usually relieve PHN pain. You may need to work with a pain specialist. Treatment may  include:   Antidepressant medicines to help with pain and improve sleep.   Antiseizure medicines to relieve nerve pain.   Strong pain relievers (opioids).   A numbing patch worn on the skin (lidocaine patch).  HOME CARE INSTRUCTIONS   It may take a long time to recover from PHN. Work closely with your health care provider, and have a good support system at home.   Take all medicines as directed by your health care provider.   Wear loose, comfortable clothing.   Cover sensitive areas with a dressing to reduce friction from clothing rubbing on the area.   If cold does not make your pain worse, try applying a cool compress or cooling gel pack to the area.   Talk to your health care provider if you feel depressed or desperate. Living with long-term pain can be depressing.  SEEK MEDICAL CARE IF:   Your medicine is not helping.   You are struggling to manage your pain at home.  This information is not intended to replace advice given to you by your health care provider. Make sure you discuss any questions you have with your health care provider.   Document Released: 01/21/2003 Document Revised: 11/21/2014 Document Reviewed: 10/22/2013   Elsevier Interactive Patient Education 2016 [employer] Inc[employer]

## 2016-08-02 NOTE — Addendum Note (Signed)
Addended by: Lurlean Nanny on: 08/02/2016 01:53 PM   Modules accepted: Orders

## 2016-08-16 ENCOUNTER — Other Ambulatory Visit: Payer: Self-pay | Admitting: Internal Medicine

## 2016-08-16 DIAGNOSIS — B0229 Other postherpetic nervous system involvement: Secondary | ICD-10-CM

## 2016-08-16 NOTE — Telephone Encounter (Signed)
Norco last filled 07/22/16--please advise

## 2016-08-16 NOTE — Telephone Encounter (Signed)
Pt called to request refills of her hydrocodone, neurontin and prednizone.  She can be reached at [phone]

## 2016-08-17 MED ORDER — HYDROCODONE-ACETAMINOPHEN 5-325 MG PO TABS: 1.0000 | ORAL_TABLET | Freq: Three times a day (TID) | ORAL | 0 refills | Status: DC | PRN

## 2016-08-17 MED ORDER — PREDNISONE 5 MG PO TABS: 5.0000 mg | ORAL_TABLET | ORAL | 1 refills | Status: DC

## 2016-08-17 MED ORDER — GABAPENTIN 100 MG PO CAPS: 200.0000 mg | ORAL_CAPSULE | Freq: Three times a day (TID) | ORAL | 0 refills | Status: DC

## 2016-08-17 NOTE — Telephone Encounter (Signed)
Rx left in front office for pick up and pt is aware  

## 2016-08-17 NOTE — Telephone Encounter (Signed)
Norco can be filled on 10/8

## 2016-09-01 ENCOUNTER — Encounter: Payer: Self-pay | Admitting: Internal Medicine

## 2016-09-01 ENCOUNTER — Ambulatory Visit (INDEPENDENT_AMBULATORY_CARE_PROVIDER_SITE_OTHER): Payer: Medicare Other | Admitting: Internal Medicine

## 2016-09-01 VITALS — BP 126/78 | HR 74 | Temp 97.7°F | Ht 64.0 in | Wt 182.5 lb

## 2016-09-01 DIAGNOSIS — J301 Allergic rhinitis due to pollen: Secondary | ICD-10-CM

## 2016-09-01 DIAGNOSIS — E7800 Pure hypercholesterolemia, unspecified: Secondary | ICD-10-CM | POA: Diagnosis not present

## 2016-09-01 DIAGNOSIS — Z0000 Encounter for general adult medical examination without abnormal findings: Secondary | ICD-10-CM

## 2016-09-01 DIAGNOSIS — J449 Chronic obstructive pulmonary disease, unspecified: Secondary | ICD-10-CM | POA: Diagnosis not present

## 2016-09-01 DIAGNOSIS — E099 Drug or chemical induced diabetes mellitus without complications: Secondary | ICD-10-CM

## 2016-09-01 DIAGNOSIS — N3281 Overactive bladder: Secondary | ICD-10-CM

## 2016-09-01 DIAGNOSIS — J302 Other seasonal allergic rhinitis: Secondary | ICD-10-CM | POA: Insufficient documentation

## 2016-09-01 DIAGNOSIS — E039 Hypothyroidism, unspecified: Secondary | ICD-10-CM | POA: Diagnosis not present

## 2016-09-01 DIAGNOSIS — E669 Obesity, unspecified: Secondary | ICD-10-CM

## 2016-09-01 DIAGNOSIS — B0229 Other postherpetic nervous system involvement: Secondary | ICD-10-CM

## 2016-09-01 DIAGNOSIS — T380X5A Adverse effect of glucocorticoids and synthetic analogues, initial encounter: Secondary | ICD-10-CM

## 2016-09-01 LAB — CBC
HEMATOCRIT: 40.9 % (ref 36.0–46.0)
Hemoglobin: 13.8 g/dL (ref 12.0–15.0)
MCHC: 33.8 g/dL (ref 30.0–36.0)
MCV: 91.6 fl (ref 78.0–100.0)
Platelets: 312 10*3/uL (ref 150.0–400.0)
RBC: 4.47 Mil/uL (ref 3.87–5.11)
RDW: 13.6 % (ref 11.5–15.5)
WBC: 7.1 10*3/uL (ref 4.0–10.5)

## 2016-09-01 LAB — LIPID PANEL
CHOL/HDL RATIO: 4
CHOLESTEROL: 175 mg/dL (ref 0–200)
HDL: 48.1 mg/dL (ref >39.00)
LDL CALC: 91 mg/dL (ref 0–99)
NonHDL: 126.86
Triglycerides: 181 mg/dL — ABNORMAL HIGH (ref 0.0–149.0)
VLDL: 36.2 mg/dL (ref 0.0–40.0)

## 2016-09-01 LAB — COMPREHENSIVE METABOLIC PANEL
ALBUMIN: 4.1 g/dL (ref 3.5–5.2)
ALT: 15 U/L (ref 0–35)
AST: 20 U/L (ref 0–37)
Alkaline Phosphatase: 69 U/L (ref 39–117)
BUN: 27 mg/dL — AB (ref 6–23)
CHLORIDE: 101 meq/L (ref 96–112)
CO2: 27 meq/L (ref 19–32)
Calcium: 9.3 mg/dL (ref 8.4–10.5)
Creatinine, Ser: 0.85 mg/dL (ref 0.40–1.20)
GFR: 67.7 mL/min (ref >60.00)
GLUCOSE: 199 mg/dL — AB (ref 70–99)
POTASSIUM: 4.3 meq/L (ref 3.5–5.1)
SODIUM: 137 meq/L (ref 135–145)
Total Bilirubin: 0.3 mg/dL (ref 0.2–1.2)
Total Protein: 7.2 g/dL (ref 6.0–8.3)

## 2016-09-01 LAB — MICROALBUMIN / CREATININE URINE RATIO
CREATININE, U: 154.4 mg/dL
Microalb Creat Ratio: 1.3 mg/g (ref 0.0–30.0)
Microalb, Ur: 2 mg/dL — ABNORMAL HIGH (ref 0.0–1.9)

## 2016-09-01 LAB — TSH: TSH: 2.46 u[IU]/mL (ref 0.35–4.50)

## 2016-09-01 LAB — HEMOGLOBIN A1C: HEMOGLOBIN A1C: 6.8 % — AB (ref 4.6–6.5)

## 2016-09-01 MED ORDER — NYSTATIN 100000 UNIT/GM EX POWD: Freq: Two times a day (BID) | CUTANEOUS | 0 refills | Status: DC

## 2016-09-01 NOTE — Assessment & Plan Note (Signed)
Continue Zyrtec and Atrovent

## 2016-09-01 NOTE — Progress Notes (Signed)
HPI:  Pt presents to the clinic today for her Medicare Wellness Exam. She is also due for follow up of chronic conditions.  COPD: Takes Breo and Prednisone daily. She uses Albuterol about once a week. She does follow with Dr. Alva Garnet, pulmonologist.   Hypothyroid: Denies any issues on her current dose of Synthroid. Denies cold sensation, weight gain, excessive dry skin or constipation.  Obesity: Not following any specific diet. She has lost 13 lbs in the last year. She is not currently exercising secondary to her recent bout of shingles.   Steroid Induced DM 2: Her last A1C was 10.2%. She does not test her sugars. She is taking Metformin and Glipizide daily, but was advised to start taking her Metformin 2 x day. She reports she was told by her daughter to only take it once daily due to concern for hypoglycemia. Her last eye exam was 5 years ago. Flu 07/2016. Pneumovax: Unsure of exact date, 5 or 6 years ago in [place]. Prevnar: 08/2015. She denies numbness or tingling in her hands or feet.  Post Herpetic Neuralgia: She is taking the Neurontin and Hydrocodone only at bedtime. She feels like her pain is better and does not need the Hydrocodone anymore.  OAB: She is not taking the Ditropan for this. She just felt like she no longer needed it. She reports she wakes up 2-3 times a night to urinate.  Seasonal Allergies: She takes Zyrtec and Atrovent as needed.  Past Medical History:  Diagnosis Date  . Arthritis   . Asthma   . COPD (chronic obstructive pulmonary disease) ([place])   . Diabetes ([place])   . Thyroid disease     Current Outpatient Prescriptions  Medication Sig Dispense Refill  . albuterol (PROVENTIL) (2.5 MG/3ML) 0.083% nebulizer solution Take 3 mLs (2.5 mg total) by nebulization every 4 (four) hours as needed for wheezing or shortness of breath. 75 mL 12  . albuterol (VENTOLIN HFA) 108 (90 BASE) MCG/ACT inhaler Inhale 1-2 puffs into the lungs every 6 (six) hours as needed for wheezing  or shortness of breath. 1 Inhaler 2  . cetirizine (ZYRTEC) 10 MG tablet Take 10 mg by mouth daily.    . Cholecalciferol (VITAMIN D PO) Take 1 tablet by mouth daily.    . fluticasone furoate-vilanterol (BREO ELLIPTA) 100-25 MCG/INH AEPB Inhale 1 puff into the lungs every morning. 180 each 2  . gabapentin (NEURONTIN) 100 MG capsule Take 2 capsules (200 mg total) by mouth 3 (three) times daily. 180 capsule 0  . glipiZIDE (GLUCOTROL XL) 5 MG 24 hr tablet TAKE 1 TABLET BY MOUTH EVERY MORNING WITH BREAKFAST 90 tablet 0  . HYDROcodone-acetaminophen (NORCO/VICODIN) 5-325 MG tablet Take 1 tablet by mouth every 8 (eight) hours as needed for moderate pain. Fill on or after/08/21/16 90 tablet 0  . ipratropium (ATROVENT) 0.03 % nasal spray Place 2 sprays into the nose 4 (four) times daily. 30 mL 1  . ketoconazole (NIZORAL) 2 % cream APPLY EXTERNALLY TO THE AFFECTED AREA DAILY 15 g 0  . levothyroxine (SYNTHROID, LEVOTHROID) 75 MCG tablet Take 1 tablet (75 mcg total) by mouth daily before breakfast. MUST SCHEDULE ANNUAL EXAM 90 tablet 0  . meloxicam (MOBIC) 15 MG tablet Take 1 tablet (15 mg total) by mouth every other day. 15 tablet 2  . metFORMIN (GLUCOPHAGE) 500 MG tablet Take 1 tablet (500 mg total) by mouth daily with breakfast. 90 tablet 0  . Multiple Vitamin (MULTIVITAMIN) capsule Take 1 capsule by mouth daily.    Marland Kitchen  oxybutynin (DITROPAN-XL) 5 MG 24 hr tablet Take 1 tablet (5 mg total) by mouth at bedtime. 30 tablet 2  . predniSONE (DELTASONE) 5 MG tablet Take 1 tablet (5 mg total) by mouth every other day. 60 tablet 1  . triamcinolone (NASACORT AQ) 55 MCG/ACT AERO nasal inhaler Place 2 sprays into the nose daily. 1 Inhaler 12   No current facility-administered medications for this visit.     No Known Allergies  Family History  Problem Relation Age of Onset  . Arthritis Father   . Diabetes Sister   . Diabetes Daughter   . Cancer Neg Hx   . Heart disease Neg Hx   . Stroke Neg Hx     Social  History   Social History  . Marital status: Unknown    Spouse name: N/A  . Number of children: N/A  . Years of education: N/A   Occupational History  . Retired    Social History Main Topics  . Smoking status: Former Smoker    Packs/day: 1.50    Years: 30.00    Types: Cigarettes    Quit date: 11/15/1987  . Smokeless tobacco: Never Used  . Alcohol use No  . Drug use: No  . Sexual activity: Not on file   Other Topics Concern  . Not on file   Social History Narrative  . No narrative on file    Hospitiliaztions: None  Health Maintenance:    Flu: 07/2016  Tetanus: not sure  Pneumovax: not sure  Prevnar: 08/2015  Zostavax: 2015  Mammogram: 2014  Pap Smear: no longer screening   Bone Density: > 2 years ago  Colon Screening: no longer screening  Eye Doctor: > 5 years ago  Dental Exam: as needed   Providers:   PCP: Webb Silversmith, NP-C  Pulmonologist: Dr. Leonidas Romberg   I have personally reviewed and have noted:  1. The patient's medical and social history 2. Their use of alcohol, tobacco or illicit drugs 3. Their current medications and supplements 4. The patient's functional ability including ADL's, fall risks, home safety risks and hearing or visual impairment. 5. Diet and physical activities 6. Evidence for depression or mood disorder  Subjective:   Review of Systems:   Constitutional: Pt reports fatigue. Denies fever, malaise, headache or abrupt weight changes.  HEENT: Denies eye pain, eye redness, ear pain, ringing in the ears, wax buildup, runny nose, nasal congestion, bloody nose, or sore throat. Respiratory: Pt reports shortness of breath. Denies difficulty breathing, cough or sputum production.   Cardiovascular: Denies chest pain, chest tightness, palpitations or swelling in the hands or feet.  Gastrointestinal: Denies abdominal pain, bloating, constipation, diarrhea or blood in the stool.  GU: Pt reports urinary frequency. Denies urgency, pain with  urination, burning sensation, blood in urine, odor or discharge. Musculoskeletal: Denies decrease in range of motion, difficulty with gait, muscle pain or joint pain and swelling.  Skin: Pt reports rash under breast and in bilateral groins. Denies lesions or ulcercations.  Neurological: Denies dizziness, difficulty with memory, difficulty with speech or problems with balance and coordination.  Psych: Denies anxiety, depression, SI/HI.  No other specific complaints in a complete review of systems (except as listed in HPI above).  Objective:  PE:   BP 126/78   Pulse 74   Temp 97.7 F (36.5 C) (Oral)   Ht 5\' 4"  (1.626 m)   Wt 182 lb 8 oz (82.8 kg)   SpO2 96%   BMI 31.33 kg/m   Wt  Readings from Last 3 Encounters:  07/22/16 180 lb (81.6 kg)  06/23/16 179 lb (81.2 kg)  02/18/16 184 lb 9.6 oz (83.7 kg)    General: Appears her stated age, in NAD. Skin: Warm, dry and intact. Yeast noted under the breast and in the groins.  HEENT: Head: normal shape and size; Eyes: sclera white, no icterus, conjunctiva pink, PERRLA and EOMs intact; Ears: Tm's gray and intact, normal light reflex; Throat/Mouth: Teeth present, mucosa pink and moist, no exudate, lesions or ulcerations noted.  Neck: Neck supple, trachea midline. No masses, lumps or thyromegaly present.  Cardiovascular: Normal rate and rhythm. S1,S2 noted.  No murmur, rubs or gallops noted. No JVD or BLE edema. No carotid bruits noted. Pulmonary/Chest: Normal effort and positive vesicular breath sounds. No respiratory distress. No wheezes, rales or ronchi noted.  Musculoskeletal: No difficulty with gait. Neurological: Alert and oriented.  Psychiatric: Mood and affect normal. Behavior is normal. Judgment and thought content normal.     BMET    Component Value Date/Time   NA 133 (L) 12/18/2015 1419   K 3.4 (L) 12/18/2015 1419   CL 97 12/18/2015 1419   CO2 24 12/18/2015 1419   GLUCOSE 252 (H) 12/18/2015 1419   BUN 30 (H) 12/18/2015 1419    CREATININE 1.13 12/18/2015 1419   CALCIUM 9.2 12/18/2015 1419    Lipid Panel     Component Value Date/Time   CHOL 178 03/23/2015 1052   TRIG 146.0 03/23/2015 1052   HDL 62.10 03/23/2015 1052   CHOLHDL 3 03/23/2015 1052   VLDL 29.2 03/23/2015 1052   LDLCALC 87 03/23/2015 1052    CBC    Component Value Date/Time   WBC 10.8 (H) 12/18/2015 1419   RBC 5.08 12/18/2015 1419   HGB 15.0 12/18/2015 1419   HCT 46.1 (H) 12/18/2015 1419   PLT 245.0 12/18/2015 1419   MCV 90.7 12/18/2015 1419   MCHC 32.6 12/18/2015 1419   RDW 13.2 12/18/2015 1419    Hgb A1C Lab Results  Component Value Date   HGBA1C 10.2 (H) 12/18/2015      Assessment and Plan:   Medicare Annual Wellness Visit:  Diet: She occasionally eats meats. She consumes fruits and veggies daily. She tries to avoid fried foods. She drinks mostly sweet tea. Physical activity: Sedentary Depression/mood screen: Negative Hearing: Decreased Visual acuity: Grossly normal ADLs: Capable Fall risk: None Home safety: Good Cognitive evaluation: Intact to orientation, naming, recall and repetition EOL planning: No adv directives, full code/ I agree  Preventative Medicine: Will request immunization status from previous PCP. Flu and prevnar UTD. She reports she has had tetanus, pneumovax and zostovax but not sure of the dates. She no longer wants to do pap smear, mammograms, bone density of colon screenings. Encouraged her to consume a balanced diet and exercise regimen. Advised her to see an eye doctor and dentist annually.   Next appointment: 3 months, follow up appts  Candida Intertrigo:  eRx for Nystatin Powder Let me know if persist or worsens  Mitra Duling, NP

## 2016-09-01 NOTE — Assessment & Plan Note (Signed)
She will continue Breo, Prednisone and Albuterol She will continue to follow with Dr. Rosita Fire

## 2016-09-01 NOTE — Assessment & Plan Note (Signed)
No longer taking Ditropan Will monitor

## 2016-09-01 NOTE — Assessment & Plan Note (Signed)
TSH and T4 today Continue Synthroid for now

## 2016-09-01 NOTE — Patient Instructions (Signed)

## 2016-09-01 NOTE — Assessment & Plan Note (Signed)
Non compliant with meds A1C and microalbumin today She will continue Metformin and Glipzide for now Encouraged her to make an appt for an eye exam- she declines Flu and prevnar today Will find out about pneumovax Foot exam today Continue Metformin and Glipizide for now until we get your labs back

## 2016-09-01 NOTE — Assessment & Plan Note (Signed)
Improving Continue Neurontin She shouldn't need any more Hydrocodone

## 2016-09-01 NOTE — Assessment & Plan Note (Signed)
Encouraged her to work on diet and exercise 

## 2016-09-02 LAB — T4, FREE: Free T4: 0.84 ng/dL (ref 0.60–1.60)

## 2016-09-12 ENCOUNTER — Telehealth: Payer: Self-pay | Admitting: Internal Medicine

## 2016-09-12 MED ORDER — METFORMIN HCL 500 MG PO TABS: 500.0000 mg | ORAL_TABLET | Freq: Every day | ORAL | 1 refills | Status: DC

## 2016-09-12 MED ORDER — LOSARTAN POTASSIUM 25 MG PO TABS: 12.5000 mg | ORAL_TABLET | Freq: Every day | ORAL | 1 refills | Status: DC

## 2016-09-12 MED ORDER — GLIPIZIDE ER 5 MG PO TB24: 5.0000 mg | ORAL_TABLET | Freq: Every day | ORAL | 1 refills | Status: DC

## 2016-09-12 NOTE — Addendum Note (Signed)
Addended by: Lurlean Nanny on: 09/12/2016 05:26 PM   Modules accepted: Orders

## 2016-09-13 NOTE — Telephone Encounter (Signed)
Opened in error

## 2016-09-15 ENCOUNTER — Other Ambulatory Visit: Payer: Self-pay | Admitting: Internal Medicine

## 2016-10-03 ENCOUNTER — Encounter: Payer: Self-pay | Admitting: Primary Care

## 2016-10-03 ENCOUNTER — Ambulatory Visit (INDEPENDENT_AMBULATORY_CARE_PROVIDER_SITE_OTHER): Payer: Medicare Other | Admitting: Primary Care

## 2016-10-03 VITALS — BP 146/86 | HR 76 | Temp 98.0°F

## 2016-10-03 DIAGNOSIS — J441 Chronic obstructive pulmonary disease with (acute) exacerbation: Secondary | ICD-10-CM

## 2016-10-03 MED ORDER — AZITHROMYCIN 250 MG PO TABS: ORAL_TABLET | ORAL | 0 refills | Status: DC

## 2016-10-03 MED ORDER — HYDROCODONE-HOMATROPINE 5-1.5 MG/5ML PO SYRP: 5.0000 mL | ORAL_SOLUTION | Freq: Every evening | ORAL | 0 refills | Status: DC | PRN

## 2016-10-03 MED ORDER — PREDNISONE 20 MG PO TABS: ORAL_TABLET | ORAL | 0 refills | Status: DC

## 2016-10-03 MED ORDER — AMOXICILLIN-POT CLAVULANATE 875-125 MG PO TABS: 1.0000 | ORAL_TABLET | Freq: Two times a day (BID) | ORAL | 0 refills | Status: DC

## 2016-10-03 NOTE — Progress Notes (Signed)
Pre visit review using our clinic review tool, if applicable. No additional management support is needed unless otherwise documented below in the visit note. 

## 2016-10-03 NOTE — Patient Instructions (Signed)
Start Prednisone 20 mg tablets. Take 2 tablets by mouth daily for 5 days. Do not take your 5 mg tablets until you've completed the 20 mg tablets.  Fill the antibiotic Friday this week if your symptoms do not improve, or sooner if you start running fevers, you start to develop green sputum, you start to feel worse.  Cough/Congestion: Try taking Mucinex DM. This will help loosen up the mucous in your chest. Ensure you take this medication with a full glass of water.  It was a pleasure meeting you!

## 2016-10-03 NOTE — Progress Notes (Signed)
Subjective:    Patient ID: Megan Bradshaw, female    DOB: Mar 22, 1930, 82 y.o.   MRN: [id number]  HPI  Megan Bradshaw is a 82 year old female with a history of COPD who presents today with a chief complaint of cough. She also reports chest congestion, increased dyspnea. Her cough is dry. Her symptoms began Friday (3 days ago). She's managed on Prednisone 5 mg every other day for chronic dyspnea. She's used her albuterol nebulized treatments every 4 hours for the last 24 hours without improvement. She is compliant to her Breo every day. She denies fevers, fatigue.   Review of Systems  Constitutional: Positive for fatigue. Negative for fever.  HENT: Positive for postnasal drip. Negative for congestion, ear pain, sinus pressure and sore throat.   Respiratory: Positive for cough and shortness of breath. Negative for wheezing.   Cardiovascular: Negative for chest pain.       Past Medical History:  Diagnosis Date  . Arthritis   . Asthma   . COPD (chronic obstructive pulmonary disease) ([place])   . Diabetes ([place])   . Thyroid disease      Social History   Social History  . Marital status: Unknown    Spouse name: N/A  . Number of children: N/A  . Years of education: N/A   Occupational History  . Retired    Social History Main Topics  . Smoking status: Former Smoker    Packs/day: 1.50    Years: 30.00    Types: Cigarettes    Quit date: 11/15/1987  . Smokeless tobacco: Never Used  . Alcohol use No  . Drug use: No  . Sexual activity: Not on file   Other Topics Concern  . Not on file   Social History Narrative  . No narrative on file    Past Surgical History:  Procedure Laterality Date  . ABDOMINAL HYSTERECTOMY  1984  . APPENDECTOMY  1939   . CATARACT EXTRACTION, BILATERAL    . CHOLECYSTECTOMY  1987  . NASAL SINUS SURGERY  1990  . REPLACEMENT TOTAL KNEE Right 2007    Family History  Problem Relation Age of Onset  . Arthritis Father   . Diabetes Sister   . Diabetes  Daughter   . Cancer Neg Hx   . Heart disease Neg Hx   . Stroke Neg Hx     No Known Allergies  Current Outpatient Prescriptions on File Prior to Visit  Medication Sig Dispense Refill  . albuterol (PROVENTIL) (2.5 MG/3ML) 0.083% nebulizer solution Take 3 mLs (2.5 mg total) by nebulization every 4 (four) hours as needed for wheezing or shortness of breath. 75 mL 12  . albuterol (VENTOLIN HFA) 108 (90 BASE) MCG/ACT inhaler Inhale 1-2 puffs into the lungs every 6 (six) hours as needed for wheezing or shortness of breath. 1 Inhaler 2  . cetirizine (ZYRTEC) 10 MG tablet Take 10 mg by mouth daily.    . Cholecalciferol (VITAMIN D PO) Take 1 tablet by mouth daily.    . fluticasone furoate-vilanterol (BREO ELLIPTA) 100-25 MCG/INH AEPB Inhale 1 puff into the lungs every morning. 180 each 2  . gabapentin (NEURONTIN) 100 MG capsule Take 2 capsules (200 mg total) by mouth 3 (three) times daily. 180 capsule 0  . glipiZIDE (GLUCOTROL XL) 5 MG 24 hr tablet Take 1 tablet (5 mg total) by mouth daily with breakfast. 90 tablet 1  . ipratropium (ATROVENT) 0.03 % nasal spray Place 2 sprays into the nose 4 (four) times daily.  30 mL 1  . levothyroxine (SYNTHROID, LEVOTHROID) 75 MCG tablet TAKE 1 TABLET(75 MCG) BY MOUTH DAILY BEFORE BREAKFAST 90 tablet 1  . losartan (COZAAR) 25 MG tablet Take 0.5 tablets (12.5 mg total) by mouth daily. 45 tablet 1  . metFORMIN (GLUCOPHAGE) 500 MG tablet Take 1 tablet (500 mg total) by mouth daily with breakfast. 90 tablet 1  . Multiple Vitamin (MULTIVITAMIN) capsule Take 1 capsule by mouth daily.    Marland Kitchen nystatin (MYCOSTATIN/NYSTOP) powder Apply topically 2 (two) times daily. 30 g 0  . oxybutynin (DITROPAN-XL) 5 MG 24 hr tablet Take 1 tablet (5 mg total) by mouth at bedtime. 30 tablet 2  . predniSONE (DELTASONE) 5 MG tablet Take 1 tablet (5 mg total) by mouth every other day. 60 tablet 1  . triamcinolone (NASACORT AQ) 55 MCG/ACT AERO nasal inhaler Place 2 sprays into the nose daily. 1  Inhaler 12   No current facility-administered medications on file prior to visit.     BP (!) 146/86   Pulse 76   Temp 98 F (36.7 C) (Oral)   SpO2 97%    Objective:   Physical Exam  Constitutional: She appears well-nourished.  HENT:  Right Ear: Tympanic membrane and ear canal normal.  Left Ear: Tympanic membrane and ear canal normal.  Nose: Right sinus exhibits no maxillary sinus tenderness and no frontal sinus tenderness. Left sinus exhibits no maxillary sinus tenderness and no frontal sinus tenderness.  Mouth/Throat: Oropharynx is clear and moist.  Eyes: Conjunctivae are normal.  Neck: Neck supple.  Cardiovascular: Normal rate and regular rhythm.   Pulmonary/Chest: Effort normal and breath sounds normal. She has no wheezes. She has no rales.  Lymphadenopathy:    She has no cervical adenopathy.  Skin: Skin is warm and dry.          Assessment & Plan:  Viral URI vs COPD Exacerbation:  Increased dyspnea, mild dry cough, fatigue x 3 days. No improvement with albuterol nebulized treatments and Breo. Exam today with mostly clear lungs with mild tightness to airways, dose not appear acutely ill. She meets criteria for mild COPD exacerbation. Rx for Prednisone burst sent to pharmacy. Rx printed for Augmentin course, if symptoms persist she will fill Friday this week. Rx for Hycodan printed per patient request. Fluids, rest, Mucinex. Follow up PRN.  Sheral Flow, NP

## 2016-10-17 ENCOUNTER — Ambulatory Visit (INDEPENDENT_AMBULATORY_CARE_PROVIDER_SITE_OTHER): Payer: Medicare Other | Admitting: Internal Medicine

## 2016-10-17 ENCOUNTER — Encounter: Payer: Self-pay | Admitting: Internal Medicine

## 2016-10-17 VITALS — BP 132/86 | HR 95 | Temp 97.4°F

## 2016-10-17 DIAGNOSIS — R05 Cough: Secondary | ICD-10-CM | POA: Diagnosis not present

## 2016-10-17 DIAGNOSIS — R059 Cough, unspecified: Secondary | ICD-10-CM

## 2016-10-17 DIAGNOSIS — J411 Mucopurulent chronic bronchitis: Secondary | ICD-10-CM | POA: Diagnosis not present

## 2016-10-17 DIAGNOSIS — R0982 Postnasal drip: Secondary | ICD-10-CM

## 2016-10-17 MED ORDER — PREDNISONE 5 MG PO TABS: 5.0000 mg | ORAL_TABLET | ORAL | 1 refills | Status: DC

## 2016-10-17 MED ORDER — GLIPIZIDE ER 5 MG PO TB24: 5.0000 mg | ORAL_TABLET | Freq: Every day | ORAL | 1 refills | Status: DC

## 2016-10-17 NOTE — Progress Notes (Signed)
HPI  Pt presents to the clinic today with c/o runny nose and cough. She reports she is blowing green mucous out of her nose. The cough is productive of green mucous. She denies fever, chills or body aches. She is mildly short of breath, but no more than normal. She has a history of asthma/COPD. She was seen 11/20 for the same, treated with a course of Prednisone and Augmentin. She has taken the medications as prescribed but reports she doesn't feel much better, although the color of her mucous has lightened. She is taking Breo and Prednisone as prescribed. She request a refill of Prednisone today. She uses her Albuterol inhaler. She also has a history of allergies and takes Zyrtec daily, Nasocort as needed. She has not had sick contacts.  Review of Systems        Past Medical History:  Diagnosis Date  . Arthritis   . Asthma   . COPD (chronic obstructive pulmonary disease) ([place])   . Diabetes ([place])   . Thyroid disease     Family History  Problem Relation Age of Onset  . Arthritis Father   . Diabetes Sister   . Diabetes Daughter   . Cancer Neg Hx   . Heart disease Neg Hx   . Stroke Neg Hx     Social History   Social History  . Marital status: Unknown    Spouse name: N/A  . Number of children: N/A  . Years of education: N/A   Occupational History  . Retired    Social History Main Topics  . Smoking status: Former Smoker    Packs/day: 1.50    Years: 30.00    Types: Cigarettes    Quit date: 11/15/1987  . Smokeless tobacco: Never Used  . Alcohol use No  . Drug use: No  . Sexual activity: Not on file   Other Topics Concern  . Not on file   Social History Narrative  . No narrative on file    No Known Allergies   Constitutional: Positive fatigue. Denies headache, fever or abrupt weight changes.  HEENT:  Positive runny nose. Denies eye redness, eye pain, pressure behind the eyes, facial pain, nasal congestion, ear pain, ringing in the ears, wax buildup, or sore   throat. Respiratory: Positive cough and shortness of breath. Denies difficulty breathing.  Cardiovascular: Denies chest pain, chest tightness, palpitations or swelling in the hands or feet.   No other specific complaints in a complete review of systems (except as listed in HPI above).  Objective:  BP 132/86   Pulse 95   Temp 97.4 F (36.3 C) (Oral)   SpO2 97%   Wt Readings from Last 3 Encounters:  09/01/16 182 lb 8 oz (82.8 kg)  07/22/16 180 lb (81.6 kg)  06/23/16 179 lb (81.2 kg)     General: Appears her stated age, in NAD. HEENT: Head: normal shape and size, no sinus tenderness noted; Eyes: sclera white, no icterus, conjunctiva pink; Ears: Tm's gray and intact, normal light reflex; Nose: mucosa pink and moist, septum midline; Throat/Mouth: + PND. Teeth present, mucosa pink and moist, no exudate noted, no lesions or ulcerations noted.  Neck: No cervical lymphadenopathy.  Cardiovascular: Normal rate and rhythm. S1,S2 noted.  No murmur, rubs or gallops noted.  Pulmonary/Chest: Normal effort and positive vesicular breath sounds. No respiratory distress. No wheezes, rales or ronchi noted.       Assessment & Plan:   Cough secondary to PND:  Get some rest and drink  plenty of water No need for repeat abx Switch Zyrtec to Xyzal Take your Nasocort daily Can also take Mucinex 600 mg every 12 hours and Nyquil at night Return precautions discussed  RTC as needed or if symptoms persist or worsen. Webb Silversmith, NP

## 2016-10-17 NOTE — Patient Instructions (Signed)
Cough, Adult Introduction A cough helps to clear your throat and lungs. A cough may last only 2-3 weeks (acute), or it may last longer than 8 weeks (chronic). Many different things can cause a cough. A cough may be a sign of an illness or another medical condition. Follow these instructions at home:  Pay attention to any changes in your cough.  Take medicines only as told by your doctor.  If you were prescribed an antibiotic medicine, take it as told by your doctor. Do not stop taking it even if you start to feel better.  Talk with your doctor before you try using a cough medicine.  Drink enough fluid to keep your pee (urine) clear or pale yellow.  If the air is dry, use a cold steam vaporizer or humidifier in your home.  Stay away from things that make you cough at work or at home.  If your cough is worse at night, try using extra pillows to raise your head up higher while you sleep.  Do not smoke, and try not to be around smoke. If you need help quitting, ask your doctor.  Do not have caffeine.  Do not drink alcohol.  Rest as needed. Contact a doctor if:  You have new problems (symptoms).  You cough up yellow fluid (pus).  Your cough does not get better after 2-3 weeks, or your cough gets worse.  Medicine does not help your cough and you are not sleeping well.  You have pain that gets worse or pain that is not helped with medicine.  You have a fever.  You are losing weight and you do not know why.  You have night sweats. Get help right away if:  You cough up blood.  You have trouble breathing.  Your heartbeat is very fast. This information is not intended to replace advice given to you by your health care provider. Make sure you discuss any questions you have with your health care provider. Document Released: 07/14/2011 Document Revised: 04/07/2016 Document Reviewed: 01/07/2015  2017 [employer]  

## 2016-10-18 ENCOUNTER — Other Ambulatory Visit: Payer: Self-pay | Admitting: Internal Medicine

## 2016-10-19 NOTE — Telephone Encounter (Signed)
Not while she is on Prednisone

## 2016-10-19 NOTE — Telephone Encounter (Signed)
Please advise if okay for pt to continue medication, not on current med list--"completed course" was listed in chart

## 2016-10-25 ENCOUNTER — Other Ambulatory Visit: Payer: Self-pay

## 2016-10-25 MED ORDER — MELOXICAM 15 MG PO TABS: 15.0000 mg | ORAL_TABLET | ORAL | 1 refills | Status: DC

## 2016-10-27 ENCOUNTER — Telehealth: Payer: Self-pay

## 2016-10-27 NOTE — Telephone Encounter (Signed)
Pt left v/m requesting cb about rx that was requested earlier in week by [facility]. Pt said she was told that [facility] sent electronic request and now rx is costing her $8.00 more than should. Unable to reach pt at [phone]; no answer and recording said pt not available but v/m was not an option. Left message at pts home to call office when convenient.

## 2016-11-11 ENCOUNTER — Ambulatory Visit (INDEPENDENT_AMBULATORY_CARE_PROVIDER_SITE_OTHER): Payer: Medicare Other | Admitting: Family Medicine

## 2016-11-11 ENCOUNTER — Encounter: Payer: Self-pay | Admitting: Family Medicine

## 2016-11-11 VITALS — BP 120/80 | HR 91 | Temp 97.1°F

## 2016-11-11 DIAGNOSIS — S61230A Puncture wound without foreign body of right index finger without damage to nail, initial encounter: Secondary | ICD-10-CM

## 2016-11-11 DIAGNOSIS — J301 Allergic rhinitis due to pollen: Secondary | ICD-10-CM | POA: Diagnosis not present

## 2016-11-11 DIAGNOSIS — R05 Cough: Secondary | ICD-10-CM | POA: Diagnosis not present

## 2016-11-11 DIAGNOSIS — W540XXA Bitten by dog, initial encounter: Secondary | ICD-10-CM | POA: Diagnosis not present

## 2016-11-11 DIAGNOSIS — R059 Cough, unspecified: Secondary | ICD-10-CM

## 2016-11-11 MED ORDER — AMOXICILLIN-POT CLAVULANATE 875-125 MG PO TABS: 1.0000 | ORAL_TABLET | Freq: Two times a day (BID) | ORAL | 0 refills | Status: DC

## 2016-11-11 MED ORDER — TRIAMCINOLONE ACETONIDE 55 MCG/ACT NA AERO: 2.0000 | INHALATION_SPRAY | Freq: Every day | NASAL | 12 refills | Status: DC

## 2016-11-11 MED ORDER — METHYLPREDNISOLONE ACETATE 80 MG/ML IJ SUSP
80.0000 mg | Freq: Once | INTRAMUSCULAR | Status: AC
  Administered 2016-11-11: 80 mg via INTRAMUSCULAR

## 2016-11-11 MED ORDER — HYDROCOD POLST-CPM POLST ER 10-8 MG/5ML PO SUER: 5.0000 mL | Freq: Two times a day (BID) | ORAL | 0 refills | Status: DC | PRN

## 2016-11-11 NOTE — Progress Notes (Signed)
Subjective:    Patient ID: Megan Bradshaw, female    DOB: [birth year], 82 y.o.   MRN: [id number]  HPI This is an 82 yo female who presents today with continued cough. She was seen 10/03/16 and was given augmentin and prednisone, was seen 10/17/16 with continued cough that was thought to be related to post nasal drainage, her antihistamine was changed to Xyzal. Today she request a steroid shot and Tussionex. No fever, thick sputum. Did not get relief with Mucinex, Dayquil/Nyquil. Fatigued from not sleeping at night due to cough. No ear pain , no sore throat, clear nasal drainage. Continues to take prednisone 5 mg qod.  Her dog accidentally bit her finger 2 weeks ago. Used peroxide and keeping it bandaged. Doesn't seem to be healing.   Past Medical History:  Diagnosis Date  . Arthritis   . Asthma   . COPD (chronic obstructive pulmonary disease) ([place])   . Diabetes ([place])   . Thyroid disease    Past Surgical History:  Procedure Laterality Date  . ABDOMINAL HYSTERECTOMY  1984  . APPENDECTOMY  1939   . CATARACT EXTRACTION, BILATERAL    . CHOLECYSTECTOMY  1987  . NASAL SINUS SURGERY  1990  . REPLACEMENT TOTAL KNEE Right 2007   Family History  Problem Relation Age of Onset  . Arthritis Father   . Diabetes Sister   . Diabetes Daughter   . Cancer Neg Hx   . Heart disease Neg Hx   . Stroke Neg Hx    Social History  Substance Use Topics  . Smoking status: Former Smoker    Packs/day: 1.50    Years: 30.00    Types: Cigarettes    Quit date: 11/15/1987  . Smokeless tobacco: Never Used  . Alcohol use No      Review of Systems Per HPI    Objective:   Physical Exam  Constitutional: She is oriented to person, place, and time. She appears well-developed and well-nourished. No distress.  Obese.   HENT:  Head: Atraumatic.  Mouth/Throat: Oropharynx is clear and moist.  Eyes: Conjunctivae are normal.  Cardiovascular: Normal rate, regular rhythm and normal heart sounds.     Pulmonary/Chest: Effort normal. She has wheezes (few faint expiratory left mid).  Neurological: She is alert and oriented to person, place, and time.  Skin: Skin is warm and dry. She is not diaphoretic.  Right index finger with 3 mm open area with central white area, no surrounding erythema or streaking, looks moist, but no drainage.   Vitals reviewed.     BP 120/80   Pulse 91   Temp 97.1 F (36.2 C)   SpO2 97%  Wt Readings from Last 3 Encounters:  09/01/16 182 lb 8 oz (82.8 kg)  07/22/16 180 lb (81.6 kg)  06/23/16 179 lb (81.2 kg)       Assessment & Plan:  1. Dog bite, initial encounter - will cover with antibiotic since wound 60 weeks old and still open, she was instructed to leave open to air, keep clean and dry and RTC if not improved with treatment or if it gets wose - amoxicillin-clavulanate (AUGMENTIN) 875-125 MG tablet; Take 1 tablet by mouth 2 (two) times daily.  Dispense: 14 tablet; Refill: 0  2. Puncture wound of right index finger - amoxicillin-clavulanate (AUGMENTIN) 875-125 MG tablet; Take 1 tablet by mouth 2 (two) times daily.  Dispense: 14 tablet; Refill: 0  3. Cough - methylPREDNISolone acetate (DEPO-MEDROL) injection 80 mg; Inject 1 mL (80 mg  total) into the muscle once.- discussed potential side effects including localized irritation.  - chlorpheniramine-HYDROcodone (TUSSIONEX PENNKINETIC ER) 10-8 MG/5ML SUER; Take 5 mLs by mouth every 12 (twelve) hours as needed for cough.  Dispense: 140 mL; Refill: 0 - follow up if not improved in 5-7 days, continue Mucinex and fluids  4. Chronic seasonal allergic rhinitis due to pollen - triamcinolone (NASACORT AQ) 55 MCG/ACT AERO nasal inhaler; Place 2 sprays into the nose daily.  Dispense: 1 Inhaler; Refill: [place], FNP-BC  [facility]  11/11/2016 9:40 AM

## 2016-11-11 NOTE — Patient Instructions (Signed)
Keep wound on finger open to air as much as possible, if needed, wrap loosely If it is not healed after antibiotic, or if it gets worse, please come back in  If cough not better in 5-7 days, please let us know Continue mucinex and lots of water

## 2016-11-18 ENCOUNTER — Other Ambulatory Visit: Payer: Self-pay

## 2016-11-18 MED ORDER — FLUTICASONE FUROATE-VILANTEROL 100-25 MCG/INH IN AEPB
1.0000 | INHALATION_SPRAY | Freq: Every morning | RESPIRATORY_TRACT | 2 refills | Status: DC
Start: 2016-11-18 — End: 2017-01-02

## 2016-11-29 ENCOUNTER — Ambulatory Visit (INDEPENDENT_AMBULATORY_CARE_PROVIDER_SITE_OTHER): Payer: Medicare Other | Admitting: Internal Medicine

## 2016-11-29 ENCOUNTER — Encounter: Payer: Self-pay | Admitting: Internal Medicine

## 2016-11-29 VITALS — BP 132/78 | HR 84 | Temp 97.3°F

## 2016-11-29 DIAGNOSIS — R0982 Postnasal drip: Secondary | ICD-10-CM

## 2016-11-29 DIAGNOSIS — R05 Cough: Secondary | ICD-10-CM | POA: Diagnosis not present

## 2016-11-29 DIAGNOSIS — R059 Cough, unspecified: Secondary | ICD-10-CM

## 2016-11-29 DIAGNOSIS — J418 Mixed simple and mucopurulent chronic bronchitis: Secondary | ICD-10-CM | POA: Diagnosis not present

## 2016-11-29 MED ORDER — MOMETASONE FUROATE 50 MCG/ACT NA SUSP: 2.0000 | Freq: Every day | NASAL | 12 refills | Status: DC

## 2016-11-29 MED ORDER — LEVOCETIRIZINE DIHYDROCHLORIDE 5 MG PO TABS: 5.0000 mg | ORAL_TABLET | Freq: Every evening | ORAL | 2 refills | Status: DC

## 2016-11-29 NOTE — Progress Notes (Signed)
HPI  Pt presents to the clinic today with a cough. This started 2 weeks ago. She was seen 11/11/16 for the same. She was given a steroid injection and  Provided with a RX for Tussionex. She has noticed some improvement since that visit. She reports runny nose and ongoing cough. She is blowing clear mucous out of her nose. The cough is non productive. She denies worsening shortness of breath. She denies fever, chills or body aches.  She has tried Mucinex, Zyrtec, her inhalers and neb treatments with minimal relief. She has a history of COPD. She has had sick contacts. She is UTD on her flu and pneumonia vaccines.  Past Medical History:  Diagnosis Date  . Arthritis   . Asthma   . COPD (chronic obstructive pulmonary disease) ([place])   . Diabetes ([place])   . Thyroid disease     Current Outpatient Prescriptions  Medication Sig Dispense Refill  . albuterol (PROVENTIL) (2.5 MG/3ML) 0.083% nebulizer solution Take 3 mLs (2.5 mg total) by nebulization every 4 (four) hours as needed for wheezing or shortness of breath. 75 mL 12  . albuterol (VENTOLIN HFA) 108 (90 BASE) MCG/ACT inhaler Inhale 1-2 puffs into the lungs every 6 (six) hours as needed for wheezing or shortness of breath. 1 Inhaler 2  . cetirizine (ZYRTEC) 10 MG tablet Take 10 mg by mouth daily.    . chlorpheniramine-HYDROcodone (TUSSIONEX PENNKINETIC ER) 10-8 MG/5ML SUER Take 5 mLs by mouth every 12 (twelve) hours as needed for cough. 140 mL 0  . Cholecalciferol (VITAMIN D PO) Take 1 tablet by mouth daily.    . fluticasone furoate-vilanterol (BREO ELLIPTA) 100-25 MCG/INH AEPB Inhale 1 puff into the lungs every morning. 180 each 2  . gabapentin (NEURONTIN) 100 MG capsule Take 2 capsules (200 mg total) by mouth 3 (three) times daily. 180 capsule 0  . glipiZIDE (GLUCOTROL XL) 5 MG 24 hr tablet Take 1 tablet (5 mg total) by mouth daily with breakfast. 90 tablet 1  . HYDROcodone-homatropine (HYCODAN) 5-1.5 MG/5ML syrup Take 5 mLs by mouth at bedtime as  needed for cough. 50 mL 0  . ipratropium (ATROVENT) 0.03 % nasal spray Place 2 sprays into the nose 4 (four) times daily. 30 mL 1  . levothyroxine (SYNTHROID, LEVOTHROID) 75 MCG tablet TAKE 1 TABLET(75 MCG) BY MOUTH DAILY BEFORE BREAKFAST 90 tablet 1  . losartan (COZAAR) 25 MG tablet Take 0.5 tablets (12.5 mg total) by mouth daily. 45 tablet 1  . meloxicam (MOBIC) 15 MG tablet Take 1 tablet (15 mg total) by mouth every other day. 15 tablet 1  . metFORMIN (GLUCOPHAGE) 500 MG tablet Take 1 tablet (500 mg total) by mouth daily with breakfast. 90 tablet 1  . Multiple Vitamin (MULTIVITAMIN) capsule Take 1 capsule by mouth daily.    Marland Kitchen nystatin (MYCOSTATIN/NYSTOP) powder Apply topically 2 (two) times daily. 30 g 0  . oxybutynin (DITROPAN-XL) 5 MG 24 hr tablet Take 1 tablet (5 mg total) by mouth at bedtime. 30 tablet 2  . predniSONE (DELTASONE) 5 MG tablet Take 1 tablet (5 mg total) by mouth every other day. 60 tablet 1  . triamcinolone (NASACORT AQ) 55 MCG/ACT AERO nasal inhaler Place 2 sprays into the nose daily. 1 Inhaler 12   No current facility-administered medications for this visit.     No Known Allergies  Family History  Problem Relation Age of Onset  . Arthritis Father   . Diabetes Sister   . Diabetes Daughter   . Cancer Neg  Hx   . Heart disease Neg Hx   . Stroke Neg Hx     Social History   Social History  . Marital status: Unknown    Spouse name: N/A  . Number of children: N/A  . Years of education: N/A   Occupational History  . Retired    Social History Main Topics  . Smoking status: Former Smoker    Packs/day: 1.50    Years: 30.00    Types: Cigarettes    Quit date: 11/15/1987  . Smokeless tobacco: Never Used  . Alcohol use No  . Drug use: No  . Sexual activity: Not on file   Other Topics Concern  . Not on file   Social History Narrative  . No narrative on file     Constitutional: Pt reports fatigue. Denies fever, malaise, headache or abrupt weight changes.   HEENT: Pt reports runny nose. Denies eye pain, eye redness, ear pain, ringing in the ears, wax buildup,  nasal congestion, bloody nose, or sore throat. Respiratory: Pt reports cough. Denies difficulty breathing, shortness of breath, or sputum production.    No other specific complaints in a complete review of systems (except as listed in HPI above).  BP 132/78   Pulse 84   Temp 97.3 F (36.3 C) (Oral)   SpO2 98%  Wt Readings from Last 3 Encounters:  09/01/16 182 lb 8 oz (82.8 kg)  07/22/16 180 lb (81.6 kg)  06/23/16 179 lb (81.2 kg)    General: Appears her stated age, in NAD. HEENT: Head: normal shape and size; Nose: mucosa boggy and moist, septum midline; Throat/Mouth: Teeth present, mucosa pink and moist, + PND, no exudate, lesions or ulcerations noted.  Neck:  No adenopathy noted.  Pulmonary/Chest: Normal effort and positive vesicular breath sounds. No respiratory distress. No wheezes, rales or ronchi noted.   BMET    Component Value Date/Time   NA 137 09/01/2016 1400   K 4.3 09/01/2016 1400   CL 101 09/01/2016 1400   CO2 27 09/01/2016 1400   GLUCOSE 199 (H) 09/01/2016 1400   BUN 27 (H) 09/01/2016 1400   CREATININE 0.85 09/01/2016 1400   CALCIUM 9.3 09/01/2016 1400    Lipid Panel     Component Value Date/Time   CHOL 175 09/01/2016 1400   TRIG 181.0 (H) 09/01/2016 1400   HDL 48.10 09/01/2016 1400   CHOLHDL 4 09/01/2016 1400   VLDL 36.2 09/01/2016 1400   LDLCALC 91 09/01/2016 1400    CBC    Component Value Date/Time   WBC 7.1 09/01/2016 1400   RBC 4.47 09/01/2016 1400   HGB 13.8 09/01/2016 1400   HCT 40.9 09/01/2016 1400   PLT 312.0 09/01/2016 1400   MCV 91.6 09/01/2016 1400   MCHC 33.8 09/01/2016 1400   RDW 13.6 09/01/2016 1400    Hgb A1C Lab Results  Component Value Date   HGBA1C 6.8 (H) 09/01/2016    Assessment and Plan:  Cough secondary to PND:  Will d/c Zyrtrec, Atrovent and Nasocort eRxfofr Zyxal and Nasonex  COPD:  Continue Prednisone  and inhalers as prescribed No indications for abx at this time  RTC as needed or if symptoms persist or worsens Truitt Cruey, NP

## 2016-11-30 NOTE — Patient Instructions (Signed)
Cough, Adult Introduction A cough helps to clear your throat and lungs. A cough may last only 2-3 weeks (acute), or it may last longer than 8 weeks (chronic). Many different things can cause a cough. A cough may be a sign of an illness or another medical condition. Follow these instructions at home:  Pay attention to any changes in your cough.  Take medicines only as told by your doctor.  If you were prescribed an antibiotic medicine, take it as told by your doctor. Do not stop taking it even if you start to feel better.  Talk with your doctor before you try using a cough medicine.  Drink enough fluid to keep your pee (urine) clear or pale yellow.  If the air is dry, use a cold steam vaporizer or humidifier in your home.  Stay away from things that make you cough at work or at home.  If your cough is worse at night, try using extra pillows to raise your head up higher while you sleep.  Do not smoke, and try not to be around smoke. If you need help quitting, ask your doctor.  Do not have caffeine.  Do not drink alcohol.  Rest as needed. Contact a doctor if:  You have new problems (symptoms).  You cough up yellow fluid (pus).  Your cough does not get better after 2-3 weeks, or your cough gets worse.  Medicine does not help your cough and you are not sleeping well.  You have pain that gets worse or pain that is not helped with medicine.  You have a fever.  You are losing weight and you do not know why.  You have night sweats. Get help right away if:  You cough up blood.  You have trouble breathing.  Your heartbeat is very fast. This information is not intended to replace advice given to you by your health care provider. Make sure you discuss any questions you have with your health care provider. Document Released: 07/14/2011 Document Revised: 04/07/2016 Document Reviewed: 01/07/2015  2017 [employer]  

## 2016-12-06 NOTE — Telephone Encounter (Signed)
Pt has not called back but has been seen X 2 since call.

## 2016-12-14 ENCOUNTER — Encounter (INDEPENDENT_AMBULATORY_CARE_PROVIDER_SITE_OTHER): Payer: Self-pay

## 2016-12-14 ENCOUNTER — Encounter: Payer: Self-pay | Admitting: Internal Medicine

## 2016-12-14 ENCOUNTER — Ambulatory Visit (INDEPENDENT_AMBULATORY_CARE_PROVIDER_SITE_OTHER): Payer: Medicare Other | Admitting: Internal Medicine

## 2016-12-14 VITALS — BP 126/78 | HR 81 | Temp 97.8°F

## 2016-12-14 DIAGNOSIS — J441 Chronic obstructive pulmonary disease with (acute) exacerbation: Secondary | ICD-10-CM | POA: Diagnosis not present

## 2016-12-14 MED ORDER — DOXYCYCLINE HYCLATE 100 MG PO TABS: 100.0000 mg | ORAL_TABLET | Freq: Two times a day (BID) | ORAL | 0 refills | Status: DC

## 2016-12-14 MED ORDER — HYDROCODONE-HOMATROPINE 5-1.5 MG/5ML PO SYRP: 5.0000 mL | ORAL_SOLUTION | Freq: Three times a day (TID) | ORAL | 0 refills | Status: DC | PRN

## 2016-12-14 NOTE — Patient Instructions (Signed)
Cough, Adult Introduction A cough helps to clear your throat and lungs. A cough may last only 2-3 weeks (acute), or it may last longer than 8 weeks (chronic). Many different things can cause a cough. A cough may be a sign of an illness or another medical condition. Follow these instructions at home:  Pay attention to any changes in your cough.  Take medicines only as told by your doctor.  If you were prescribed an antibiotic medicine, take it as told by your doctor. Do not stop taking it even if you start to feel better.  Talk with your doctor before you try using a cough medicine.  Drink enough fluid to keep your pee (urine) clear or pale yellow.  If the air is dry, use a cold steam vaporizer or humidifier in your home.  Stay away from things that make you cough at work or at home.  If your cough is worse at night, try using extra pillows to raise your head up higher while you sleep.  Do not smoke, and try not to be around smoke. If you need help quitting, ask your doctor.  Do not have caffeine.  Do not drink alcohol.  Rest as needed. Contact a doctor if:  You have new problems (symptoms).  You cough up yellow fluid (pus).  Your cough does not get better after 2-3 weeks, or your cough gets worse.  Medicine does not help your cough and you are not sleeping well.  You have pain that gets worse or pain that is not helped with medicine.  You have a fever.  You are losing weight and you do not know why.  You have night sweats. Get help right away if:  You cough up blood.  You have trouble breathing.  Your heartbeat is very fast. This information is not intended to replace advice given to you by your health care provider. Make sure you discuss any questions you have with your health care provider. Document Released: 07/14/2011 Document Revised: 04/07/2016 Document Reviewed: 01/07/2015  2017 [employer]  

## 2016-12-14 NOTE — Progress Notes (Signed)
HPI  Pt presents to the clinic today with c/o ongoing runny nose, cough and chest congestion. This has been going on for 2+ weeks. She was seen 11/29/16 and advised to start Nasonex and Xyzal. She reports she can not tolerate the Nasonex. It drips down the back of her throat and makes her throat sore. She is now blowing yellow/green mucous out of her nose. The cough is productive of yellow/green mucous. She denies fever, chills or body aches but has been extremely fatigued. Her shortness of breath is stable. She has tried Sudafed, Robitussin and Delsym, in addition to her prescription medications. She follows with pulmonology who advised her that she has asthma not COPD. Her flu shot is UTD.  Review of Systems        Past Medical History:  Diagnosis Date  . Arthritis   . Asthma   . COPD (chronic obstructive pulmonary disease) ([place])   . Diabetes ([place])   . Thyroid disease     Family History  Problem Relation Age of Onset  . Arthritis Father   . Diabetes Sister   . Diabetes Daughter   . Cancer Neg Hx   . Heart disease Neg Hx   . Stroke Neg Hx     Social History   Social History  . Marital status: Unknown    Spouse name: N/A  . Number of children: N/A  . Years of education: N/A   Occupational History  . Retired    Social History Main Topics  . Smoking status: Former Smoker    Packs/day: 1.50    Years: 30.00    Types: Cigarettes    Quit date: 11/15/1987  . Smokeless tobacco: Never Used  . Alcohol use No  . Drug use: No  . Sexual activity: Not on file   Other Topics Concern  . Not on file   Social History Narrative  . No narrative on file    No Known Allergies   Constitutional: Positive fatigue. Denies headache, fever or abrupt weight changes.  HEENT:  Positive runny nose. Denies eye redness, eye pain, pressure behind the eyes, facial pain, nasal congestion, ear pain, ringing in the ears, wax buildup or sore throat. Respiratory: Positive cough. Denies difficulty  breathing or shortness of breath.  Cardiovascular: Denies chest pain, chest tightness, palpitations or swelling in the hands or feet.   No other specific complaints in a complete review of systems (except as listed in HPI above).  Objective:   BP 126/78   Pulse 81   Temp 97.8 F (36.6 C) (Oral)   SpO2 99%  Wt Readings from Last 3 Encounters:  09/01/16 182 lb 8 oz (82.8 kg)  07/22/16 180 lb (81.6 kg)  06/23/16 179 lb (81.2 kg)     General: Appears her stated age, chronically ill appearing in NAD. HEENT: Head: normal shape and size, no sinus tenderness noted; Ears: Tm's gray and intact, normal light reflex; Nose: mucosa boggyand moist, septum midline; Throat/Mouth: Teeth present, mucosa pink and moist, no exudate noted, no lesions or ulcerations noted.  Neck: No cervical lymphadenopathy.  Cardiovascular: Normal rate and rhythm. Pulmonary/Chest: Normal effort with scattered rhonchi in bilateral bases. No respiratory distress. No wheezes, rales noted.       Assessment & Plan:   COPD exacerbation:  Get some rest and drink plenty of water Continue inhalers and Prednisone as prescribed eRx for Doxycycline 100 mg BID x 10 days (she can not take Azithromax or Levaquin) Rx for Hycodan cough syrup Make  an appt with pulm for follow up  RTC as needed or if symptoms persist.   Webb Silversmith, NP

## 2016-12-19 ENCOUNTER — Telehealth: Payer: Self-pay

## 2016-12-19 MED ORDER — PREDNISONE 20 MG PO TABS: ORAL_TABLET | ORAL | 0 refills | Status: DC

## 2016-12-19 NOTE — Telephone Encounter (Signed)
pred taper sent to pharmacy

## 2016-12-19 NOTE — Telephone Encounter (Signed)
Pt is aware as instructed 

## 2016-12-19 NOTE — Telephone Encounter (Signed)
Pt left v/m; pt was seen 12/14/16 and is not feeling any better; pt cannot see pulmonologist until 02/19. Today pt having wheezing,drainage down back of throat, not resting at night. Pt thinks she needs a larger dose of prednisone; Hycodan does not last more than 2 hours and wakes up wheezing and cannot breathe well. Pt does not want to schedule appt but request cb from Avie Echevaria NP or her CMA.[facility] [address]

## 2016-12-19 NOTE — Addendum Note (Signed)
Addended by: Jearld Fenton on: 12/19/2016 11:34 AM   Modules accepted: Orders

## 2016-12-23 ENCOUNTER — Other Ambulatory Visit: Payer: Self-pay | Admitting: Internal Medicine

## 2016-12-23 DIAGNOSIS — J441 Chronic obstructive pulmonary disease with (acute) exacerbation: Secondary | ICD-10-CM

## 2017-01-02 ENCOUNTER — Ambulatory Visit (INDEPENDENT_AMBULATORY_CARE_PROVIDER_SITE_OTHER): Payer: Medicare Other | Admitting: Pulmonary Disease

## 2017-01-02 ENCOUNTER — Encounter: Payer: Self-pay | Admitting: Pulmonary Disease

## 2017-01-02 VITALS — BP 128/88 | HR 88 | Wt 184.0 lb

## 2017-01-02 DIAGNOSIS — J411 Mucopurulent chronic bronchitis: Secondary | ICD-10-CM

## 2017-01-02 DIAGNOSIS — J449 Chronic obstructive pulmonary disease, unspecified: Secondary | ICD-10-CM | POA: Diagnosis not present

## 2017-01-02 MED ORDER — AZITHROMYCIN 250 MG PO TABS: ORAL_TABLET | ORAL | 5 refills | Status: DC

## 2017-01-02 MED ORDER — AZITHROMYCIN 250 MG PO TABS: ORAL_TABLET | ORAL | 5 refills | Status: AC

## 2017-01-02 NOTE — Patient Instructions (Addendum)
1) Change Breo inhaler to Trelegy inhaler - one inhalation each day 2) Azithromycin 250 mg daily 3) Follow up in 4-6 weeks with chest Xray

## 2017-01-03 NOTE — Progress Notes (Signed)
Pt profile: 83 y.o. F moved from [place]. Initially seen by Dr Melvyn Novas. Diagnosed with COPD in 2011. Graded as Gold I by Dr Melvyn Novas. Chronic prednisone therapy  Subj: Last seen 02/2016. Missed appointment in 05/2016 due to shingles. Has had increased cough and sputum for approx 6 weeks. She has been treated but other providers with multiple antibiotics. C/O wheezing, SOB, nasal congestion. Describes her mucus as thick and white. Denies CP, fever, purulent sputum, hemoptysis, LE edema and calf tenderness.   Obj: Vitals:   01/02/17 1109 01/02/17 1113  BP:  128/88  Pulse:  88  SpO2:  92%  Weight: 184 lb (83.5 kg)    Gen: NAD HEENT: WNL Neck: No JVD Chest: slightly coarse BS, no wheezes Cardiac: Reg, no M [id number], soft, NT, +BS Ext: No C/C//E  BMET    Component Value Date/Time   NA 137 09/01/2016 1400   K 4.3 09/01/2016 1400   CL 101 09/01/2016 1400   CO2 27 09/01/2016 1400   GLUCOSE 199 (H) 09/01/2016 1400   BUN 27 (H) 09/01/2016 1400   CREATININE 0.85 09/01/2016 1400   CALCIUM 9.3 09/01/2016 1400    CBC    Component Value Date/Time   WBC 7.1 09/01/2016 1400   RBC 4.47 09/01/2016 1400   HGB 13.8 09/01/2016 1400   HCT 40.9 09/01/2016 1400   PLT 312.0 09/01/2016 1400   MCV 91.6 09/01/2016 1400   MCHC 33.8 09/01/2016 1400   RDW 13.6 09/01/2016 1400    CXR: No new film   IMPRESSION: 1) Mild COPD, with persistent dyspnea out of proportion 2) CHronic bronchitis 3) Obesity, mild   PLAN/RECS:  Change Breo to Trelegy  Azithromycin 250 mg daily for its anti-inflammatory effect Continue albuterol inhaler as needed ROV 4-6 weeks with CXR Need to clarify if she is still on prednisone @ that time   Merton Border, MD PCCM service Mobile [phone] Pager [phone]  01/03/2017

## 2017-01-09 ENCOUNTER — Ambulatory Visit (INDEPENDENT_AMBULATORY_CARE_PROVIDER_SITE_OTHER)
Admission: RE | Admit: 2017-01-09 | Discharge: 2017-01-09 | Disposition: A | Discharge status: Home / Self Care | Payer: Medicare Other | Source: Ambulatory Visit | Attending: Internal Medicine | Admitting: Internal Medicine

## 2017-01-09 ENCOUNTER — Ambulatory Visit (INDEPENDENT_AMBULATORY_CARE_PROVIDER_SITE_OTHER): Payer: Medicare Other | Admitting: Internal Medicine

## 2017-01-09 ENCOUNTER — Encounter: Payer: Self-pay | Admitting: Internal Medicine

## 2017-01-09 VITALS — BP 124/80 | HR 87 | Temp 97.5°F

## 2017-01-09 DIAGNOSIS — R0602 Shortness of breath: Secondary | ICD-10-CM

## 2017-01-09 DIAGNOSIS — J418 Mixed simple and mucopurulent chronic bronchitis: Secondary | ICD-10-CM | POA: Diagnosis not present

## 2017-01-09 DIAGNOSIS — R05 Cough: Secondary | ICD-10-CM

## 2017-01-09 DIAGNOSIS — Z9109 Other allergy status, other than to drugs and biological substances: Secondary | ICD-10-CM

## 2017-01-09 DIAGNOSIS — R053 Chronic cough: Secondary | ICD-10-CM

## 2017-01-09 NOTE — Progress Notes (Signed)
Subjective:    Patient ID: Megan Bradshaw, female    DOB: 25-Feb-1930, 83 y.o.   MRN: [id number]  HPI  Pt presents to the clinic today with persistent cough. The cough is productive of clear mucous. She has associated runny nose, post nasal drip and shortness of breath. She denies fever, chills or body aches. She has tried Mucinex, Dayquil and Nyquil in addition to her prescription medications. She has a hisotry of allergies and is currently taking Xyzal and Nasonex. She reports she has also taken Claritin, Allegra, Zyrtec and Singulair in the past. She has mild COPD. She recently saw her pulmonologist, Dr. Alva Garnet. He changed her Breo to Trelegy. He also put her on Azithromax daily, in addition to her Prednisone. She is requesting a chest xray today.  Review of Systems      Past Medical History:  Diagnosis Date  . Arthritis   . Asthma   . COPD (chronic obstructive pulmonary disease) ([place])   . Diabetes ([place])   . Thyroid disease     Current Outpatient Prescriptions  Medication Sig Dispense Refill  . albuterol (PROVENTIL) (2.5 MG/3ML) 0.083% nebulizer solution USE 1 VIAL VIA NEBULIZER EVERY 4 HOURS AS NEEDED FOR WHEEZING OR SHORTNESS OF BREATH 75 mL 0  . albuterol (VENTOLIN HFA) 108 (90 BASE) MCG/ACT inhaler Inhale 1-2 puffs into the lungs every 6 (six) hours as needed for wheezing or shortness of breath. 1 Inhaler 2  . Cholecalciferol (VITAMIN D PO) Take 1 tablet by mouth daily.    Marland Kitchen glipiZIDE (GLUCOTROL XL) 5 MG 24 hr tablet Take 1 tablet (5 mg total) by mouth daily with breakfast. 90 tablet 1  . levocetirizine (XYZAL) 5 MG tablet Take 1 tablet (5 mg total) by mouth every evening. 30 tablet 2  . levothyroxine (SYNTHROID, LEVOTHROID) 75 MCG tablet TAKE 1 TABLET(75 MCG) BY MOUTH DAILY BEFORE BREAKFAST 90 tablet 1  . meloxicam (MOBIC) 15 MG tablet Take 1 tablet (15 mg total) by mouth every other day. 15 tablet 1  . metFORMIN (GLUCOPHAGE) 500 MG tablet Take 1 tablet (500 mg total) by  mouth daily with breakfast. 90 tablet 1  . mometasone (NASONEX) 50 MCG/ACT nasal spray Place 2 sprays into the nose daily. 17 g 12  . Multiple Vitamin (MULTIVITAMIN) capsule Take 1 capsule by mouth daily.    Marland Kitchen nystatin (MYCOSTATIN/NYSTOP) powder Apply topically 2 (two) times daily. 30 g 0  . oxybutynin (DITROPAN-XL) 5 MG 24 hr tablet Take 1 tablet (5 mg total) by mouth at bedtime. 30 tablet 2  . predniSONE (DELTASONE) 5 MG tablet Take 1 tablet (5 mg total) by mouth every other day. 60 tablet 1   No current facility-administered medications for this visit.     No Known Allergies  Family History  Problem Relation Age of Onset  . Arthritis Father   . Diabetes Sister   . Diabetes Daughter   . Cancer Neg Hx   . Heart disease Neg Hx   . Stroke Neg Hx     Social History   Social History  . Marital status: Unknown    Spouse name: N/A  . Number of children: N/A  . Years of education: N/A   Occupational History  . Retired    Social History Main Topics  . Smoking status: Former Smoker    Packs/day: 1.50    Years: 30.00    Types: Cigarettes    Quit date: 11/15/1987  . Smokeless tobacco: Never Used  . Alcohol use No  .  Drug use: No  . Sexual activity: Not on file   Other Topics Concern  . Not on file   Social History Narrative  . No narrative on file     Constitutional: Pt reports malaise. Denies fever, fatigue, headache or abrupt weight changes.  HEENT: Pt reports runny nose. Denies eye pain, eye redness, ear pain, ringing in the ears, wax buildup, nasal congestion, bloody nose, or sore throat. Respiratory: Pt reports cough and shortness of breath. Denies difficulty breathing, cough or sputum production.    No other specific complaints in a complete review of systems (except as listed in HPI above).  Objective:   Physical Exam   BP 124/80   Pulse 87   Temp 97.5 F (36.4 C) (Oral)   SpO2 97%  Wt Readings from Last 3 Encounters:  01/02/17 184 lb (83.5 kg)    09/01/16 182 lb 8 oz (82.8 kg)  07/22/16 180 lb (81.6 kg)    General: Appears her stated age, in NAD. HEENT:  Nose: mucosa boggy and moist, septum midline; Throat/Mouth: Teeth present, mucosa pink and moist, no exudate, lesions or ulcerations noted.  Neck:  No adenopathy noted. Cardiovascular: Normal rate and rhythm.  Pulmonary/Chest: Normal effort and positive vesicular breath sounds, with decreased expiratory phase. No respiratory distress. No wheezes, rales or ronchi noted.   BMET    Component Value Date/Time   NA 137 09/01/2016 1400   K 4.3 09/01/2016 1400   CL 101 09/01/2016 1400   CO2 27 09/01/2016 1400   GLUCOSE 199 (H) 09/01/2016 1400   BUN 27 (H) 09/01/2016 1400   CREATININE 0.85 09/01/2016 1400   CALCIUM 9.3 09/01/2016 1400    Lipid Panel     Component Value Date/Time   CHOL 175 09/01/2016 1400   TRIG 181.0 (H) 09/01/2016 1400   HDL 48.10 09/01/2016 1400   CHOLHDL 4 09/01/2016 1400   VLDL 36.2 09/01/2016 1400   LDLCALC 91 09/01/2016 1400    CBC    Component Value Date/Time   WBC 7.1 09/01/2016 1400   RBC 4.47 09/01/2016 1400   HGB 13.8 09/01/2016 1400   HCT 40.9 09/01/2016 1400   PLT 312.0 09/01/2016 1400   MCV 91.6 09/01/2016 1400   MCHC 33.8 09/01/2016 1400   RDW 13.6 09/01/2016 1400    Hgb A1C Lab Results  Component Value Date   HGBA1C 6.8 (H) 09/01/2016           Assessment & Plan:   Persistent cough, SOB:  I do not think this is related to her COPD Advised her to continue Trelegy, Azithromax, Prednisone and follow up with Dr. Alva Garnet in 4-6 weeks I think this is more allergy related She declines Singulair, reports she has taken this in the past She is demanding chest xray today- ordered Advised her to take Mucinex 600 mg every 12 hours She may need allergy referral for medication management since she reportedly has failed everything OTC.  Will follow up after chest xray. Webb Silversmith, NP

## 2017-01-09 NOTE — Patient Instructions (Signed)
Allergic Rhinitis Allergic rhinitis is when the mucous membranes in the nose respond to allergens. Allergens are particles in the air that cause your body to have an allergic reaction. This causes you to release allergic antibodies. Through a chain of events, these eventually cause you to release histamine into the blood stream. Although meant to protect the body, it is this release of histamine that causes your discomfort, such as frequent sneezing, congestion, and an itchy, runny nose. What are the causes? Seasonal allergic rhinitis (hay fever) is caused by pollen allergens that may come from grasses, trees, and weeds. Year-round allergic rhinitis (perennial allergic rhinitis) is caused by allergens such as house dust mites, pet dander, and mold spores. What are the signs or symptoms?  Nasal stuffiness (congestion).  Itchy, runny nose with sneezing and tearing of the eyes. How is this diagnosed? Your health care provider can help you determine the allergen or allergens that trigger your symptoms. If you and your health care provider are unable to determine the allergen, skin or blood testing may be used. Your health care provider will diagnose your condition after taking your health history and performing a physical exam. Your health care provider may assess you for other related conditions, such as asthma, pink eye, or an ear infection. How is this treated? Allergic rhinitis does not have a cure, but it can be controlled by:  Medicines that block allergy symptoms. These may include allergy shots, nasal sprays, and oral antihistamines.  Avoiding the allergen. Hay fever may often be treated with antihistamines in pill or nasal spray forms. Antihistamines block the effects of histamine. There are over-the-counter medicines that may help with nasal congestion and swelling around the eyes. Check with your health care provider before taking or giving this medicine. If avoiding the allergen or the  medicine prescribed do not work, there are many new medicines your health care provider can prescribe. Stronger medicine may be used if initial measures are ineffective. Desensitizing injections can be used if medicine and avoidance does not work. Desensitization is when a patient is given ongoing shots until the body becomes less sensitive to the allergen. Make sure you follow up with your health care provider if problems continue. Follow these instructions at home: It is not possible to completely avoid allergens, but you can reduce your symptoms by taking steps to limit your exposure to them. It helps to know exactly what you are allergic to so that you can avoid your specific triggers. Contact a health care provider if:  You have a fever.  You develop a cough that does not stop easily (persistent).  You have shortness of breath.  You start wheezing.  Symptoms interfere with normal daily activities. This information is not intended to replace advice given to you by your health care provider. Make sure you discuss any questions you have with your health care provider. Document Released: 07/26/2001 Document Revised: 07/01/2016 Document Reviewed: 07/08/2013 Elsevier Interactive Patient Education  2017 Elsevier Inc.  

## 2017-01-11 ENCOUNTER — Other Ambulatory Visit: Payer: Self-pay | Admitting: Internal Medicine

## 2017-01-11 DIAGNOSIS — Z9109 Other allergy status, other than to drugs and biological substances: Secondary | ICD-10-CM

## 2017-01-17 ENCOUNTER — Other Ambulatory Visit: Payer: Self-pay | Admitting: Internal Medicine

## 2017-01-26 ENCOUNTER — Other Ambulatory Visit: Payer: Self-pay | Admitting: Internal Medicine

## 2017-01-27 ENCOUNTER — Other Ambulatory Visit: Payer: Self-pay | Admitting: Internal Medicine

## 2017-02-14 ENCOUNTER — Encounter: Payer: Self-pay | Admitting: Pulmonary Disease

## 2017-02-14 ENCOUNTER — Ambulatory Visit
Admission: RE | Admit: 2017-02-14 | Discharge: 2017-02-14 | Disposition: A | Discharge status: Home / Self Care | Payer: Medicare Other | Source: Ambulatory Visit | Attending: Pulmonary Disease | Admitting: Pulmonary Disease

## 2017-02-14 ENCOUNTER — Ambulatory Visit (INDEPENDENT_AMBULATORY_CARE_PROVIDER_SITE_OTHER): Payer: Medicare Other | Admitting: Pulmonary Disease

## 2017-02-14 VITALS — BP 138/88 | HR 91 | Wt 184.0 lb

## 2017-02-14 DIAGNOSIS — I700 Atherosclerosis of aorta: Secondary | ICD-10-CM | POA: Insufficient documentation

## 2017-02-14 DIAGNOSIS — J449 Chronic obstructive pulmonary disease, unspecified: Secondary | ICD-10-CM

## 2017-02-14 DIAGNOSIS — R053 Chronic cough: Secondary | ICD-10-CM

## 2017-02-14 DIAGNOSIS — R05 Cough: Secondary | ICD-10-CM | POA: Diagnosis not present

## 2017-02-14 MED ORDER — FLUTICASONE FUROATE-VILANTEROL 100-25 MCG/INH IN AEPB: 1.0000 | INHALATION_SPRAY | Freq: Every day | RESPIRATORY_TRACT | 5 refills | Status: DC

## 2017-02-14 NOTE — Patient Instructions (Signed)
Continue Breo, Xyzal, Nasonex and albuterol as needed  Samples of Mucinex DM provided  I agree with evaluation by allergist  Follow-up in 3-4 months

## 2017-02-14 NOTE — Progress Notes (Signed)
Pt profile: 83 y.o. F moved from [place]. Initially seen by Dr Melvyn Novas. Diagnosed with COPD in 2011. Graded as Gold I by Dr Melvyn Novas. Chronic prednisone therapy  PROBLEMS: Gold I COPD Chronic refractory cough  DATA: 10/22/14 PFTs: mild obstruction, FEV1 1.36 L (81%), FEV1/FVC 53%, TLC normal, DLCO 51% pred 02/16/16 PFTs: FVC: 2.23 L (90 %pred), FEV1: 1.30 L (71 %pred), FEV1/FVC: 58% , TLC: invalid, DLCO 78% pred, 02/14/17 CXR: chronic changes of R apical and lingular scarring  Subj: This is a routine reevaluation for COPD and chronic cough. She was last seen on 02/19 and was started on Trelegy (changed from [place]) at that time. Continues to complain of chronic cough (minimally productive), nasal congestion and exertional dyspnea. None of the symptoms have changed significantly since last visit. She has an albuterol inhaler which she uses 0-2 times per day. Denies CP, fever, purulent sputum, hemoptysis, LE edema and calf tenderness. She has a scheduled appointment with an allergist for assessment of possible allergic etiology of some of her symptoms.   Obj: Vitals:   02/14/17 1116  BP: 138/88  Pulse: 91  SpO2: 95%  Weight: 184 lb (83.5 kg)  Room air  Gen: NAD HEENT: WNL Neck: No JVD Chest: slightly coarse BS, no wheezes Cardiac: Reg, no M OBS:JGGEZ, soft, NT, +BS Ext: No C/C//E  BMET    Component Value Date/Time   NA 137 09/01/2016 1400   K 4.3 09/01/2016 1400   CL 101 09/01/2016 1400   CO2 27 09/01/2016 1400   GLUCOSE 199 (H) 09/01/2016 1400   BUN 27 (H) 09/01/2016 1400   CREATININE 0.85 09/01/2016 1400   CALCIUM 9.3 09/01/2016 1400    CBC    Component Value Date/Time   WBC 7.1 09/01/2016 1400   RBC 4.47 09/01/2016 1400   HGB 13.8 09/01/2016 1400   HCT 40.9 09/01/2016 1400   PLT 312.0 09/01/2016 1400   MCV 91.6 09/01/2016 1400   MCHC 33.8 09/01/2016 1400   RDW 13.6 09/01/2016 1400    CXR (02/14/17): Chronic changes in the right apex and lingula. No acute  findings   IMPRESSION: 1) Mild COPD, with persistent dyspnea out of proportion 2) Chronic bronchitis 3) Obesity, mild  4) Chronic cough of unclear etiology  PLAN/RECS:  Continue Breo, Xyzal, Nasonex and albuterol as needed Continue azithromycin 250 mg daily (for anti-inflammatory effect) Recommend Mucinex DM or equivalent for cough - samples provided I agree with evaluation by allergist Follow-up in 3-4 months   Merton Border, MD PCCM service Mobile [phone] Pager [phone]  02/14/2017

## 2017-03-02 ENCOUNTER — Encounter: Payer: Self-pay | Admitting: Internal Medicine

## 2017-03-02 ENCOUNTER — Ambulatory Visit (INDEPENDENT_AMBULATORY_CARE_PROVIDER_SITE_OTHER): Payer: Medicare Other | Admitting: Internal Medicine

## 2017-03-02 ENCOUNTER — Ambulatory Visit: Payer: Medicare Other | Admitting: Internal Medicine

## 2017-03-02 VITALS — BP 126/78 | HR 82 | Temp 97.4°F

## 2017-03-02 DIAGNOSIS — L298 Other pruritus: Secondary | ICD-10-CM | POA: Diagnosis not present

## 2017-03-02 DIAGNOSIS — N898 Other specified noninflammatory disorders of vagina: Secondary | ICD-10-CM

## 2017-03-02 LAB — POC URINALSYSI DIPSTICK (AUTOMATED)
Bilirubin, UA: NEGATIVE
Glucose, UA: NEGATIVE
Ketones, UA: NEGATIVE
LEUKOCYTES UA: NEGATIVE
Nitrite, UA: NEGATIVE
PH UA: 6 (ref 5.0–8.0)
PROTEIN UA: NEGATIVE
RBC UA: NEGATIVE
Spec Grav, UA: 1.025 (ref 1.010–1.025)
Urobilinogen, UA: 0.2 E.U./dL

## 2017-03-02 MED ORDER — FLUCONAZOLE 150 MG PO TABS: 150.0000 mg | ORAL_TABLET | Freq: Once | ORAL | 0 refills | Status: AC

## 2017-03-02 NOTE — Addendum Note (Signed)
Addended by: Lurlean Nanny on: 03/02/2017 02:29 PM   Modules accepted: Orders

## 2017-03-02 NOTE — Progress Notes (Signed)
Subjective:    Patient ID: Megan Bradshaw, female    DOB: 1929/12/05, 83 y.o.   MRN: [id number]  HPI  Pt presents to the clinic today with c/o vaginal itching. This started. She denies vaginal discharge, odor or bleeding. She does reports that when she urinates, it just dribbles. She denies urgency, frequency, or dysuria. She has abdominal pain. Her bowels are moving normally. She is taking Azithromax daily per her pulmonologist. She has tried Vagisil with minimal relief.   Review of Systems  Past Medical History:  Diagnosis Date  . Arthritis   . Asthma   . COPD (chronic obstructive pulmonary disease) ([place])   . Diabetes ([place])   . Thyroid disease     Current Outpatient Prescriptions  Medication Sig Dispense Refill  . albuterol (PROVENTIL) (2.5 MG/3ML) 0.083% nebulizer solution USE 1 VIAL VIA NEBULIZER EVERY 4 HOURS AS NEEDED FOR WHEEZING OR SHORTNESS OF BREATH 75 mL 0  . Cholecalciferol (VITAMIN D PO) Take 1 tablet by mouth daily.    . fluticasone furoate-vilanterol (BREO ELLIPTA) 100-25 MCG/INH AEPB Inhale 1 puff into the lungs daily. 60 each 5  . glipiZIDE (GLUCOTROL XL) 5 MG 24 hr tablet Take 1 tablet (5 mg total) by mouth daily with breakfast. 90 tablet 1  . levocetirizine (XYZAL) 5 MG tablet Take 1 tablet (5 mg total) by mouth every evening. 30 tablet 2  . levothyroxine (SYNTHROID, LEVOTHROID) 75 MCG tablet TAKE 1 TABLET(75 MCG) BY MOUTH DAILY BEFORE BREAKFAST 90 tablet 0  . meloxicam (MOBIC) 15 MG tablet Take 1 tablet (15 mg total) by mouth every other day. 15 tablet 1  . metFORMIN (GLUCOPHAGE) 500 MG tablet Take 1 tablet (500 mg total) by mouth daily with breakfast. 90 tablet 1  . mometasone (NASONEX) 50 MCG/ACT nasal spray Place 2 sprays into the nose daily. 17 g 12  . Multiple Vitamin (MULTIVITAMIN) capsule Take 1 capsule by mouth daily.    Marland Kitchen nystatin (MYCOSTATIN/NYSTOP) powder Apply topically 2 (two) times daily. 30 g 0  . oxybutynin (DITROPAN-XL) 5 MG 24 hr tablet Take 1  tablet (5 mg total) by mouth at bedtime. 30 tablet 2  . VENTOLIN HFA 108 (90 Base) MCG/ACT inhaler INHALE 1 TO 2 PUFFS INTO THE LUNGS EVERY 6 HOURS AS NEEDED FOR WHEEZING OR SHORTNESS OF BREATH 18 g 0   No current facility-administered medications for this visit.     No Known Allergies  Family History  Problem Relation Age of Onset  . Arthritis Father   . Diabetes Sister   . Diabetes Daughter   . Cancer Neg Hx   . Heart disease Neg Hx   . Stroke Neg Hx     Social History   Social History  . Marital status: Widowed    Spouse name: N/A  . Number of children: N/A  . Years of education: N/A   Occupational History  . Retired    Social History Main Topics  . Smoking status: Former Smoker    Packs/day: 1.50    Years: 30.00    Types: Cigarettes    Quit date: 11/15/1987  . Smokeless tobacco: Never Used  . Alcohol use No  . Drug use: No  . Sexual activity: Not on file   Other Topics Concern  . Not on file   Social History Narrative  . No narrative on file     Constitutional: Denies fever, malaise, fatigue, headache or abrupt weight changes.  Gastrointestinal: Denies abdominal pain, bloating, constipation, diarrhea or blood  in the stool.  GU: Pt reports vaginal itching. Denies urgency, frequency, pain with urination, burning sensation, blood in urine, odor or discharge.  No other specific complaints in a complete review of systems (except as listed in HPI above).     Objective:   Physical Exam   BP 126/78   Pulse 82   Temp 97.4 F (36.3 C) (Oral)   SpO2 99%  Wt Readings from Last 3 Encounters:  02/14/17 184 lb (83.5 kg)  01/02/17 184 lb (83.5 kg)  09/01/16 182 lb 8 oz (82.8 kg)    General: Appears her stated age, chronically ill appearing, in NAD. Abdomen: Soft and nontender. Normal bowel sounds. No distention or masses noted.  Pelvic: Deferred  BMET    Component Value Date/Time   NA 137 09/01/2016 1400   K 4.3 09/01/2016 1400   CL 101 09/01/2016 1400    CO2 27 09/01/2016 1400   GLUCOSE 199 (H) 09/01/2016 1400   BUN 27 (H) 09/01/2016 1400   CREATININE 0.85 09/01/2016 1400   CALCIUM 9.3 09/01/2016 1400    Lipid Panel     Component Value Date/Time   CHOL 175 09/01/2016 1400   TRIG 181.0 (H) 09/01/2016 1400   HDL 48.10 09/01/2016 1400   CHOLHDL 4 09/01/2016 1400   VLDL 36.2 09/01/2016 1400   LDLCALC 91 09/01/2016 1400    CBC    Component Value Date/Time   WBC 7.1 09/01/2016 1400   RBC 4.47 09/01/2016 1400   HGB 13.8 09/01/2016 1400   HCT 40.9 09/01/2016 1400   PLT 312.0 09/01/2016 1400   MCV 91.6 09/01/2016 1400   MCHC 33.8 09/01/2016 1400   RDW 13.6 09/01/2016 1400    Hgb A1C Lab Results  Component Value Date   HGBA1C 6.8 (H) 09/01/2016           Assessment & Plan:   Vaginal Itching:  Likely yeast infection Urinalysis normal eRx for Diflucan 150 mg PO x 1, repeat in 3 days Continue topical Vagisil as needed  RTC as needed or if symptoms persist or worsen Palak Tercero, NP

## 2017-03-02 NOTE — Patient Instructions (Signed)

## 2017-03-08 ENCOUNTER — Telehealth: Payer: Self-pay

## 2017-03-08 NOTE — Telephone Encounter (Signed)
Does she have a gyn? If so, she should make an appt with them. If not, then she can make an appt with me and I will have to do a pelvic exam.

## 2017-03-08 NOTE — Telephone Encounter (Signed)
Pt left v/m; pt seen on 03/02/17 and has used both diflucan with no relief from yeast infection. Pt request cb with what to do next. [facility] [address]

## 2017-03-08 NOTE — Telephone Encounter (Signed)
Pt does not have gyn and has appt scheduled with [facility] tomorrow

## 2017-03-09 ENCOUNTER — Ambulatory Visit (INDEPENDENT_AMBULATORY_CARE_PROVIDER_SITE_OTHER): Payer: Medicare Other | Admitting: Internal Medicine

## 2017-03-09 ENCOUNTER — Encounter: Payer: Self-pay | Admitting: Internal Medicine

## 2017-03-09 VITALS — BP 124/72 | HR 94 | Temp 97.9°F | Wt 187.0 lb

## 2017-03-09 DIAGNOSIS — N898 Other specified noninflammatory disorders of vagina: Secondary | ICD-10-CM | POA: Diagnosis not present

## 2017-03-09 DIAGNOSIS — L298 Other pruritus: Secondary | ICD-10-CM

## 2017-03-09 MED ORDER — CLOBETASOL PROPIONATE 0.05 % EX CREA: 1.0000 "application " | TOPICAL_CREAM | Freq: Two times a day (BID) | CUTANEOUS | 2 refills | Status: DC

## 2017-03-09 NOTE — Progress Notes (Signed)
Subjective:    Patient ID: Megan Bradshaw, female    DOB: 14-May-1930, 83 y.o.   MRN: [id number]  HPI  Pt presents to the clinic today to follow up vaginal itching and irritation. She was seen 4/19 for the same, concerned that she had a UTI. Urinalysis was negative. Pelvic exam was deferred at that time. She was treated with oral Diflucan x 2, but reports her symptoms have continued. She reports vaginal itching and burning inside and outside of the vagina. She does have overactive bladder and is taking Ditropan as prescribed. She does wear incontinence pads daily. She has tried Vagisil without any relief.   Review of Systems      Past Medical History:  Diagnosis Date  . Arthritis   . Asthma   . COPD (chronic obstructive pulmonary disease) ([place])   . Diabetes ([place])   . Thyroid disease     Current Outpatient Prescriptions  Medication Sig Dispense Refill  . albuterol (PROVENTIL) (2.5 MG/3ML) 0.083% nebulizer solution USE 1 VIAL VIA NEBULIZER EVERY 4 HOURS AS NEEDED FOR WHEEZING OR SHORTNESS OF BREATH 75 mL 0  . Cholecalciferol (VITAMIN D PO) Take 1 tablet by mouth daily.    . fluticasone furoate-vilanterol (BREO ELLIPTA) 100-25 MCG/INH AEPB Inhale 1 puff into the lungs daily. 60 each 5  . glipiZIDE (GLUCOTROL XL) 5 MG 24 hr tablet Take 1 tablet (5 mg total) by mouth daily with breakfast. 90 tablet 1  . levocetirizine (XYZAL) 5 MG tablet Take 1 tablet (5 mg total) by mouth every evening. 30 tablet 2  . levothyroxine (SYNTHROID, LEVOTHROID) 75 MCG tablet TAKE 1 TABLET(75 MCG) BY MOUTH DAILY BEFORE BREAKFAST 90 tablet 0  . meloxicam (MOBIC) 15 MG tablet Take 1 tablet (15 mg total) by mouth every other day. 15 tablet 1  . metFORMIN (GLUCOPHAGE) 500 MG tablet Take 1 tablet (500 mg total) by mouth daily with breakfast. 90 tablet 1  . mometasone (NASONEX) 50 MCG/ACT nasal spray Place 2 sprays into the nose daily. 17 g 12  . Multiple Vitamin (MULTIVITAMIN) capsule Take 1 capsule by mouth daily.     Marland Kitchen nystatin (MYCOSTATIN/NYSTOP) powder Apply topically 2 (two) times daily. 30 g 0  . oxybutynin (DITROPAN-XL) 5 MG 24 hr tablet Take 1 tablet (5 mg total) by mouth at bedtime. 30 tablet 2  . VENTOLIN HFA 108 (90 Base) MCG/ACT inhaler INHALE 1 TO 2 PUFFS INTO THE LUNGS EVERY 6 HOURS AS NEEDED FOR WHEEZING OR SHORTNESS OF BREATH 18 g 0   No current facility-administered medications for this visit.     No Known Allergies  Family History  Problem Relation Age of Onset  . Arthritis Father   . Diabetes Sister   . Diabetes Daughter   . Cancer Neg Hx   . Heart disease Neg Hx   . Stroke Neg Hx     Social History   Social History  . Marital status: Widowed    Spouse name: N/A  . Number of children: N/A  . Years of education: N/A   Occupational History  . Retired    Social History Main Topics  . Smoking status: Former Smoker    Packs/day: 1.50    Years: 30.00    Types: Cigarettes    Quit date: 11/15/1987  . Smokeless tobacco: Never Used  . Alcohol use No  . Drug use: No  . Sexual activity: Not on file   Other Topics Concern  . Not on file   Social History  Narrative  . No narrative on file     Constitutional: Denies fever, malaise, fatigue, headache or abrupt weight changes.  Gastrointestinal: Denies abdominal pain, bloating, constipation, diarrhea or blood in the stool.  GU: Pt reports vaginal itching and irritation. Denies urgency, frequency, pain with urination, blood in urine, odor or discharge.  No other specific complaints in a complete review of systems (except as listed in HPI above).  Objective:   Physical Exam   BP 124/72   Pulse 94   Temp 97.9 F (36.6 C) (Oral)   Wt 187 lb (84.8 kg)   SpO2 97%   BMI 32.10 kg/m  Wt Readings from Last 3 Encounters:  03/09/17 187 lb (84.8 kg)  02/14/17 184 lb (83.5 kg)  01/02/17 184 lb (83.5 kg)    General: Appears her stated age, in NAD. Abdomen: Soft and nontender. Normal bowel sounds. No distention or masses  noted.  Pelvic: Normal female anatomy. Thinning of the labia minora. White lesion noted on the labia minora. Irritation noted of the labia majora and upper thighs.   BMET    Component Value Date/Time   NA 137 09/01/2016 1400   K 4.3 09/01/2016 1400   CL 101 09/01/2016 1400   CO2 27 09/01/2016 1400   GLUCOSE 199 (H) 09/01/2016 1400   BUN 27 (H) 09/01/2016 1400   CREATININE 0.85 09/01/2016 1400   CALCIUM 9.3 09/01/2016 1400    Lipid Panel     Component Value Date/Time   CHOL 175 09/01/2016 1400   TRIG 181.0 (H) 09/01/2016 1400   HDL 48.10 09/01/2016 1400   CHOLHDL 4 09/01/2016 1400   VLDL 36.2 09/01/2016 1400   LDLCALC 91 09/01/2016 1400    CBC    Component Value Date/Time   WBC 7.1 09/01/2016 1400   RBC 4.47 09/01/2016 1400   HGB 13.8 09/01/2016 1400   HCT 40.9 09/01/2016 1400   PLT 312.0 09/01/2016 1400   MCV 91.6 09/01/2016 1400   MCHC 33.8 09/01/2016 1400   RDW 13.6 09/01/2016 1400    Hgb A1C Lab Results  Component Value Date   HGBA1C 6.8 (H) 09/01/2016           Assessment & Plan:   Vaginal Itching and Irritation:  Concern for Lichen Sclerosis eRx for Clobetasol Cream BID to affected area Will need referral to GYN if symptoms persist or worsen  Brynlei Klausner, NP

## 2017-03-09 NOTE — Patient Instructions (Signed)

## 2017-03-10 DIAGNOSIS — J301 Allergic rhinitis due to pollen: Secondary | ICD-10-CM | POA: Diagnosis not present

## 2017-03-22 IMAGING — DX DG CHEST 2V
2 series · 2 of 2 positions shown · non-contrast
Comparison: October 19, 2015

CLINICAL DATA: Shortness of breath and cough

EXAM:
CHEST  2 VIEW

[chest pa]
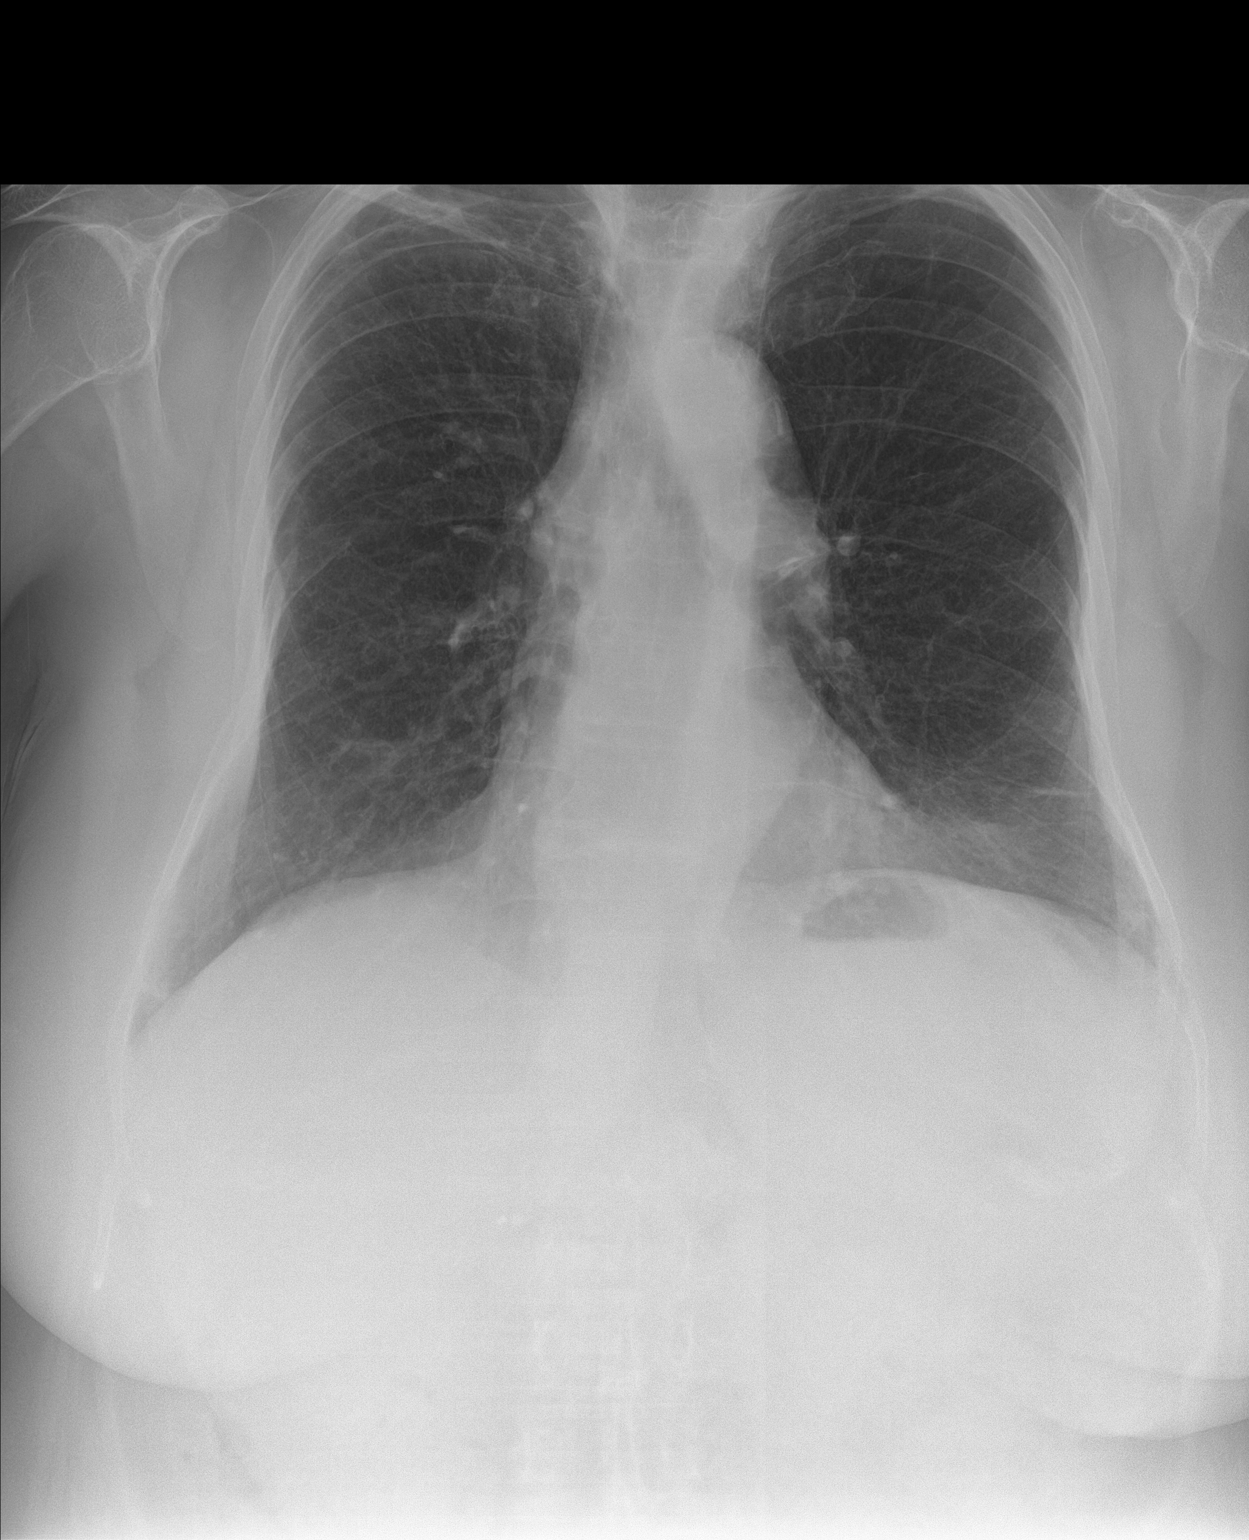

[chest lat]
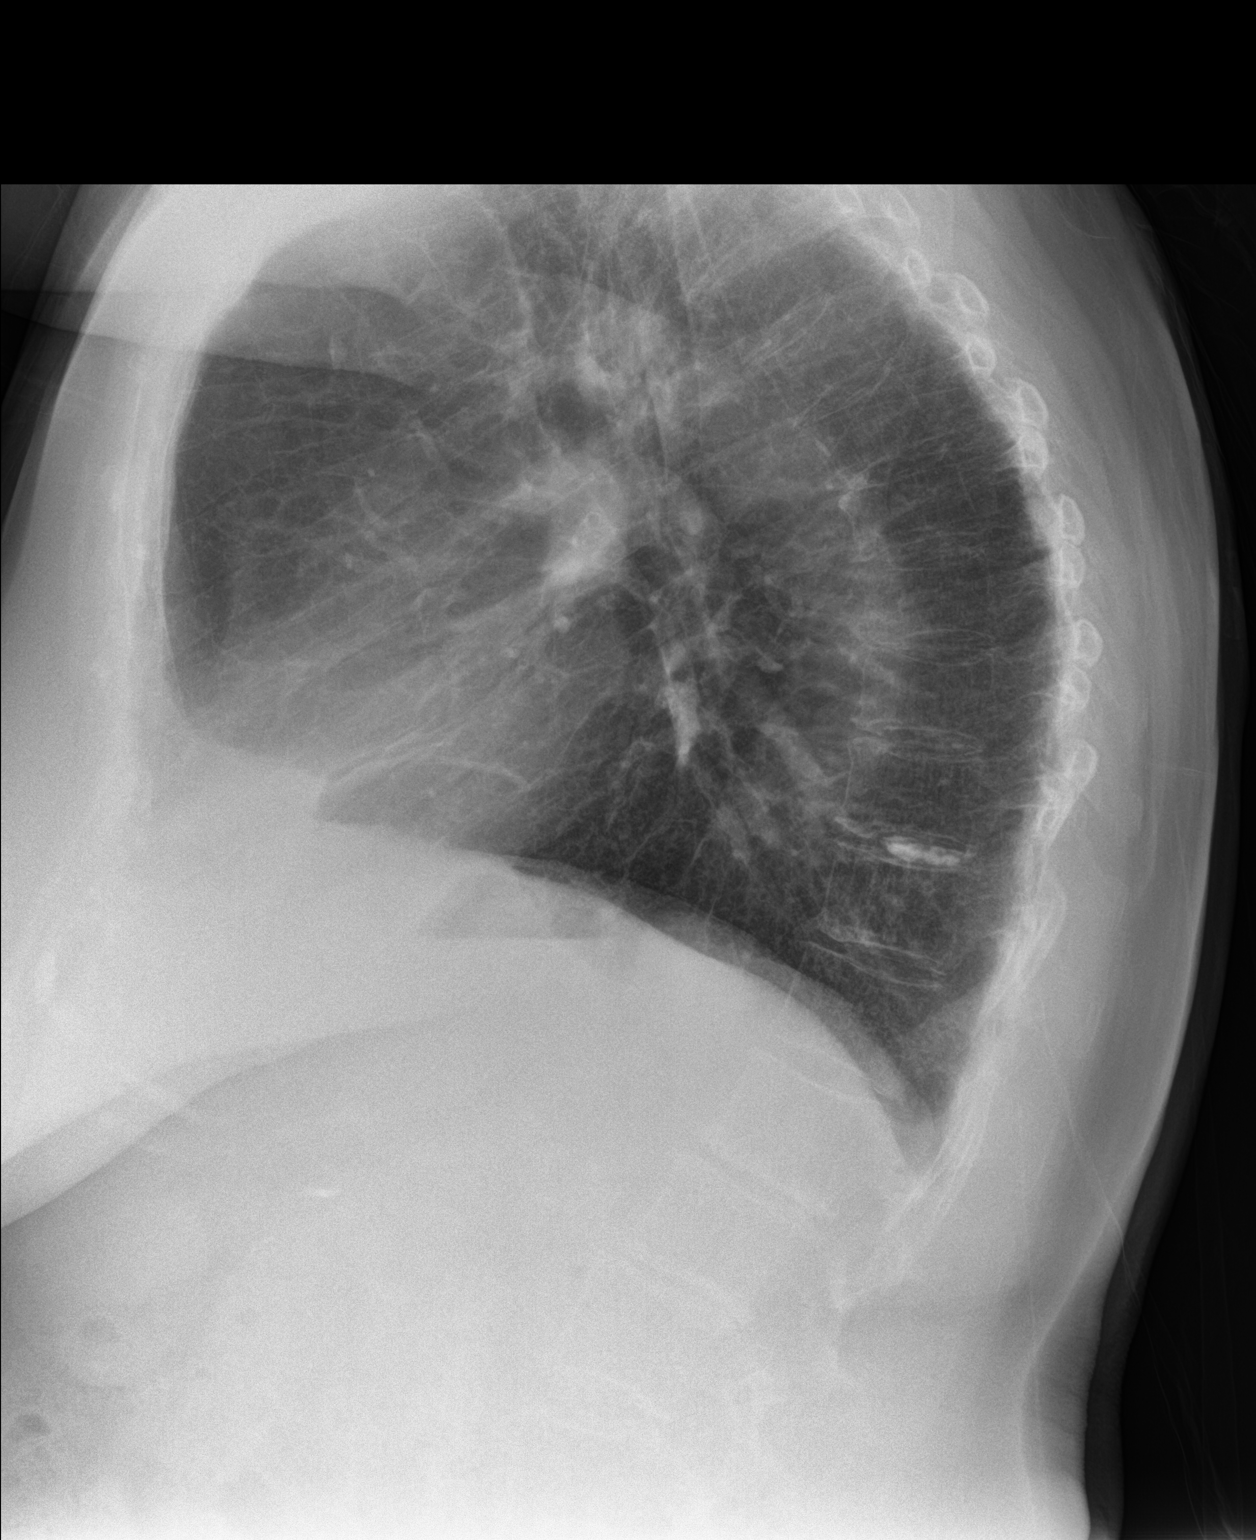

[2 of 2 positions shown; findings below may reference images not displayed]

FINDINGS: There is scarring in the left base and left perihilar regions. There
is also scarring in the right apex. There is no edema or
consolidation. Heart size and pulmonary vascularity are normal.
There is atherosclerotic calcification in the aorta. No bone lesions
are evident.
IMPRESSION: Scattered areas of scarring bilaterally, stable. No edema or
consolidation. There is aortic atherosclerosis.

## 2017-03-23 DIAGNOSIS — J301 Allergic rhinitis due to pollen: Secondary | ICD-10-CM | POA: Diagnosis not present

## 2017-03-27 ENCOUNTER — Other Ambulatory Visit: Payer: Self-pay | Admitting: Internal Medicine

## 2017-03-27 MED ORDER — LEVOTHYROXINE SODIUM 75 MCG PO TABS
ORAL_TABLET | ORAL | 0 refills | Status: DC
Start: 2017-03-27 — End: 2017-06-26

## 2017-03-27 NOTE — Telephone Encounter (Signed)
Patient called stating that she is changing pharmacy to [place] and requesting a refill on her Levothyroxine. Advised patient that refill will be sent to pharmacy per her request. Refill sent electronically.

## 2017-04-26 ENCOUNTER — Ambulatory Visit (INDEPENDENT_AMBULATORY_CARE_PROVIDER_SITE_OTHER): Payer: Medicare Other | Admitting: Family Medicine

## 2017-04-26 VITALS — BP 140/80 | HR 75 | Temp 97.7°F | Wt 188.0 lb

## 2017-04-26 DIAGNOSIS — J449 Chronic obstructive pulmonary disease, unspecified: Secondary | ICD-10-CM

## 2017-04-26 MED ORDER — HYDROCOD POLST-CPM POLST ER 10-8 MG/5ML PO SUER: 5.0000 mL | Freq: Two times a day (BID) | ORAL | 0 refills | Status: DC | PRN

## 2017-04-26 NOTE — Assessment & Plan Note (Signed)
Lung exam reassuring. She is most bothered by cough keeping her awake at night.  Discussed cough medicine- she has tried tussionex and would like to try this again- she is aware of sedation precautions. No indication for additional abx at this time. Advised to continue rxs prescribed by pulmonary. Call or return to clinic prn if these symptoms worsen or fail to improve as anticipated. The patient indicates understanding of these issues and agrees with the plan.

## 2017-04-26 NOTE — Progress Notes (Signed)
Pre visit review using our clinic review tool, if applicable. No additional management support is needed unless otherwise documented below in the visit note. 

## 2017-04-26 NOTE — Progress Notes (Signed)
SUBJECTIVE:  Megan Bradshaw is a 83 y.o. female who complains of coryza and dry cough for 4 days. She denies a history of anorexia and chest pain and admits to a history of asthma. Patient denies smoke cigarettes.  Has a h/o COPD- followed by Dr. Alva Garnet.  Was last seen by him on 02/14/17. Note reviewed.   Plan at that time was:  Continue Breo, Xyzal, Nasonex and albuterol as needed Continue azithromycin 250 mg daily (for anti-inflammatory effect) Recommend Mucinex DM or equivalent for cough - samples provided I agree with evaluation by allergist Follow-up in 3-4 months Current Outpatient Prescriptions on File Prior to Visit  Medication Sig Dispense Refill  . albuterol (PROVENTIL) (2.5 MG/3ML) 0.083% nebulizer solution USE 1 VIAL VIA NEBULIZER EVERY 4 HOURS AS NEEDED FOR WHEEZING OR SHORTNESS OF BREATH 75 mL 0  . Cholecalciferol (VITAMIN D PO) Take 1 tablet by mouth daily.    . clobetasol cream (TEMOVATE) 5.85 % Apply 1 application topically 2 (two) times daily. 30 g 2  . fluticasone furoate-vilanterol (BREO ELLIPTA) 100-25 MCG/INH AEPB Inhale 1 puff into the lungs daily. 60 each 5  . glipiZIDE (GLUCOTROL XL) 5 MG 24 hr tablet Take 1 tablet (5 mg total) by mouth daily with breakfast. 90 tablet 1  . levocetirizine (XYZAL) 5 MG tablet take 1 tablet by mouth once daily if needed 30 tablet 3  . levothyroxine (SYNTHROID, LEVOTHROID) 75 MCG tablet TAKE 1 TABLET(75 MCG) BY MOUTH DAILY BEFORE BREAKFAST 90 tablet 0  . meloxicam (MOBIC) 15 MG tablet Take 1 tablet (15 mg total) by mouth every other day. 15 tablet 1  . metFORMIN (GLUCOPHAGE) 500 MG tablet Take 1 tablet (500 mg total) by mouth daily with breakfast. 90 tablet 1  . mometasone (NASONEX) 50 MCG/ACT nasal spray Place 2 sprays into the nose daily. 17 g 12  . Multiple Vitamin (MULTIVITAMIN) capsule Take 1 capsule by mouth daily.    Marland Kitchen nystatin (MYCOSTATIN/NYSTOP) powder Apply topically 2 (two) times daily. 30 g 0  . oxybutynin (DITROPAN-XL) 5  MG 24 hr tablet Take 1 tablet (5 mg total) by mouth at bedtime. 30 tablet 2  . VENTOLIN HFA 108 (90 Base) MCG/ACT inhaler INHALE 1 TO 2 PUFFS INTO THE LUNGS EVERY 6 HOURS AS NEEDED FOR WHEEZING OR SHORTNESS OF BREATH 18 g 0   No current facility-administered medications on file prior to visit.     No Known Allergies  Past Medical History:  Diagnosis Date  . Arthritis   . Asthma   . COPD (chronic obstructive pulmonary disease) ([place])   . Diabetes ([place])   . Thyroid disease     Past Surgical History:  Procedure Laterality Date  . ABDOMINAL HYSTERECTOMY  1984  . APPENDECTOMY  1939   . CATARACT EXTRACTION, BILATERAL    . CHOLECYSTECTOMY  1987  . NASAL SINUS SURGERY  1990  . REPLACEMENT TOTAL KNEE Right 2007    Family History  Problem Relation Age of Onset  . Arthritis Father   . Diabetes Sister   . Diabetes Daughter   . Cancer Neg Hx   . Heart disease Neg Hx   . Stroke Neg Hx     Social History   Social History  . Marital status: Widowed    Spouse name: N/A  . Number of children: N/A  . Years of education: N/A   Occupational History  . Retired    Social History Main Topics  . Smoking status: Former Smoker    Packs/day:  1.50    Years: 30.00    Types: Cigarettes    Quit date: 11/15/1987  . Smokeless tobacco: Never Used  . Alcohol use No  . Drug use: No  . Sexual activity: Not on file   Other Topics Concern  . Not on file   Social History Narrative  . No narrative on file   The PMH, PSH, Social History, Family History, Medications, and allergies have been reviewed in [facility], and have been updated if relevant.  OBJECTIVE: BP 140/80   Pulse 75   Temp 97.7 F (36.5 C)   Wt 188 lb (85.3 kg)   SpO2 97%   BMI 32.27 kg/m   She appears well, vital signs are as noted. Ears normal.  Throat and pharynx normal.  Neck supple. No adenopathy in the neck. Nose is congested. Sinuses non tender. The chest is clear, without wheezes or rales.

## 2017-04-29 ENCOUNTER — Other Ambulatory Visit: Payer: Self-pay | Admitting: Internal Medicine

## 2017-05-15 ENCOUNTER — Ambulatory Visit: Payer: Medicare Other | Admitting: Family Medicine

## 2017-05-15 ENCOUNTER — Telehealth: Payer: Self-pay | Admitting: *Deleted

## 2017-05-15 ENCOUNTER — Telehealth: Payer: Self-pay | Admitting: Pulmonary Disease

## 2017-05-15 DIAGNOSIS — J441 Chronic obstructive pulmonary disease with (acute) exacerbation: Secondary | ICD-10-CM

## 2017-05-15 MED ORDER — ALBUTEROL SULFATE (2.5 MG/3ML) 0.083% IN NEBU: 2.5000 mg | INHALATION_SOLUTION | RESPIRATORY_TRACT | 5 refills | Status: DC | PRN

## 2017-05-15 NOTE — Telephone Encounter (Signed)
°*  STAT* If patient is at the pharmacy, call can be transferred to refill team.   1. Which medications need to be refilled? (please list name of each medication and dose if known) albuterol   2. Which pharmacy/location (including street and city if local pharmacy) is medication to be sent to?[facility] [address]   3. Do they need a 30 day or 90 day supply? 90 day

## 2017-05-15 NOTE — Telephone Encounter (Signed)
rx sent

## 2017-05-15 NOTE — Telephone Encounter (Signed)
Pt left voicemail at triage requesting refill of the nebulizer solution, I advised pt to call pulmonology because we haven't filled Rx before and pulmonology fills this med

## 2017-05-16 ENCOUNTER — Ambulatory Visit (INDEPENDENT_AMBULATORY_CARE_PROVIDER_SITE_OTHER): Payer: Medicare Other | Admitting: Family Medicine

## 2017-05-16 ENCOUNTER — Encounter: Payer: Self-pay | Admitting: Family Medicine

## 2017-05-16 DIAGNOSIS — J441 Chronic obstructive pulmonary disease with (acute) exacerbation: Secondary | ICD-10-CM | POA: Diagnosis not present

## 2017-05-16 MED ORDER — AZITHROMYCIN 250 MG PO TABS: ORAL_TABLET | ORAL | 0 refills | Status: DC

## 2017-05-16 MED ORDER — PREDNISONE 10 MG PO TABS: ORAL_TABLET | ORAL | 0 refills | Status: DC

## 2017-05-16 NOTE — Patient Instructions (Signed)
Prednisone with food for 10 days, then resume prednisone as you have been taking.  Start zithromax in the meantime.  Take care.  Update [place] as needed.

## 2017-05-16 NOTE — Progress Notes (Signed)
Cough started about 6 months ago, some days worse than others.  Worse in the last month or so.  I had to repeatedly question and redirect her to get a straight answer about her timeline of symptoms, the severity of her symptoms, etc.  It appears that she's been coughing episodically for 6 months but worse in the last month.  Cough is worse at night.  Still on baseline inhalers.  Using neb several times a day, with some relief that is transient.  She usually uses SABA 0-2 times a day on a "good day" but more frequently recently.    Some greenish nasal discharge.  Her sputum is light green.  Usually not discolored when at baseline.  She can walk some still but didn't feel well enough for water aerobics.  No fevers.  Some wheeze, more than normal.  Sputum discolored in the last week or so.    She has f/u with Pulmonary pending. She had f/u with allergy clinic and opted out of allergy shots.  She had positive testing at allergy clinic.    She has been checking her sugars, usually ~150.  She reports taking prednisone every other day at baseline.  This wasn't listed on her med list.    She has COPD and asthma listed on her past medical history.  PMH and SH reviewed  ROS: Per HPI unless specifically indicated in ROS section   Meds, vitals, and allergies reviewed.   GEN: nad, alert and oriented HEENT: mucous membranes moist, nasal exam stuffy but w/o erythema, clear discharge noted,  OP with cobblestoning NECK: supple w/o LA CV: rrr.   PULM: She has bilateral expiratory rhonchi with a slightly prolonged expiratory phase, but no focal decrease in breath sounds and no inc wob. Speaking in complete sentences. EXT: no edema

## 2017-05-18 ENCOUNTER — Encounter: Payer: Self-pay | Admitting: Family Medicine

## 2017-05-18 DIAGNOSIS — J441 Chronic obstructive pulmonary disease with (acute) exacerbation: Secondary | ICD-10-CM | POA: Insufficient documentation

## 2017-05-18 NOTE — Assessment & Plan Note (Signed)
Likely COPD exacerbation. No sign of pneumonia on exam. Nontoxic. Okay for outpatient follow-up. She had multiple questions. She requested a prescription for Tussionex which is likely better served with antibiotics and prednisone. I did not fill prescription for Tussionex. Discussed with her about routine steroid cautions. Increase her prednisone in the meantime, see after visit summary, advised her to keep check of her sugar in the meantime, drink plenty of fluids, and avoid carbohydrates in her diet. She requested prescription for penicillin. This is likely not useful. Explained to patient. She can take Zithromax and update [place] as needed. The coverage would be better with Zithromax. >25 minutes spent in face to face time with patient, >50% spent in counselling or coordination of care.

## 2017-05-23 ENCOUNTER — Ambulatory Visit (INDEPENDENT_AMBULATORY_CARE_PROVIDER_SITE_OTHER): Payer: Medicare Other | Admitting: Internal Medicine

## 2017-05-23 ENCOUNTER — Encounter: Payer: Self-pay | Admitting: Internal Medicine

## 2017-05-23 VITALS — BP 134/82 | HR 77 | Temp 97.8°F | Wt 188.5 lb

## 2017-05-23 DIAGNOSIS — J441 Chronic obstructive pulmonary disease with (acute) exacerbation: Secondary | ICD-10-CM

## 2017-05-23 MED ORDER — HYDROCOD POLST-CPM POLST ER 10-8 MG/5ML PO SUER: 5.0000 mL | Freq: Every evening | ORAL | 0 refills | Status: DC | PRN

## 2017-05-23 NOTE — Patient Instructions (Signed)

## 2017-05-23 NOTE — Progress Notes (Signed)
Subjective:    Patient ID: Megan Bradshaw, female    DOB: [birth year], 83 y.o.   MRN: [id number]  HPI  Pt presents to the clinic to follow up COPD exacerbation. She saw Dr. Damita Dunnings on 7/3. She was given a RX for Prednisone and Azithromax. Since that time, she reports minimal relief of symptoms. She reports persistent cough. The cough is productive of yellow mucous. She denies fever but has had chills and is extremely fatigued. She has tried Mucinex without any relief. She follows with Dr. Alva Garnet. PFT from 02/2016 reviewed. She takes Breo, Albuterol, Xyzal and Nasonex daily as prescribed.    Review of Systems  Past Medical History:  Diagnosis Date  . Arthritis   . Asthma   . COPD (chronic obstructive pulmonary disease) ([place])   . Diabetes ([place])   . Thyroid disease     Current Outpatient Prescriptions  Medication Sig Dispense Refill  . albuterol (PROVENTIL) (2.5 MG/3ML) 0.083% nebulizer solution Take 3 mLs (2.5 mg total) by nebulization every 4 (four) hours as needed for wheezing or shortness of breath. 150 mL 5  . azithromycin (ZITHROMAX) 250 MG tablet 2 tabs a day for 1 day and then 1 a day for 4 days. 6 each 0  . Cholecalciferol (VITAMIN D PO) Take 1 tablet by mouth daily.    . clobetasol cream (TEMOVATE) 5.36 % Apply 1 application topically 2 (two) times daily. 30 g 2  . fluticasone furoate-vilanterol (BREO ELLIPTA) 100-25 MCG/INH AEPB Inhale 1 puff into the lungs daily. 60 each 5  . glipiZIDE (GLUCOTROL XL) 5 MG 24 hr tablet Take 1 tablet (5 mg total) by mouth daily with breakfast. 90 tablet 1  . levocetirizine (XYZAL) 5 MG tablet take 1 tablet by mouth once daily if needed 30 tablet 3  . levothyroxine (SYNTHROID, LEVOTHROID) 75 MCG tablet TAKE 1 TABLET(75 MCG) BY MOUTH DAILY BEFORE BREAKFAST 90 tablet 0  . meloxicam (MOBIC) 15 MG tablet Take 1 tablet (15 mg total) by mouth every other day. 15 tablet 1  . metFORMIN (GLUCOPHAGE) 500 MG tablet Take 1 tablet (500 mg total) by mouth  daily with breakfast. 90 tablet 1  . mometasone (NASONEX) 50 MCG/ACT nasal spray Place 2 sprays into the nose daily. 17 g 12  . Multiple Vitamin (MULTIVITAMIN) capsule Take 1 capsule by mouth daily.    Marland Kitchen nystatin (MYCOSTATIN/NYSTOP) powder Apply topically 2 (two) times daily. 30 g 0  . oxybutynin (DITROPAN-XL) 5 MG 24 hr tablet Take 1 tablet (5 mg total) by mouth at bedtime. (Patient not taking: Reported on 05/16/2017) 30 tablet 2  . predniSONE (DELTASONE) 10 MG tablet 2 a day for 5 days, then 1 a day for 5 days, with food.  Stop meloxicam while on this course of prednisone. 15 tablet 0  . VENTOLIN HFA 108 (90 Base) MCG/ACT inhaler INHALE 1 TO 2 PUFFS INTO THE LUNGS EVERY 6 HOURS AS NEEDED FOR WHEEZING OR SHORTNESS OF BREATH 18 g 0   No current facility-administered medications for this visit.     No Known Allergies  Family History  Problem Relation Age of Onset  . Arthritis Father   . Diabetes Sister   . Diabetes Daughter   . Cancer Neg Hx   . Heart disease Neg Hx   . Stroke Neg Hx     Social History   Social History  . Marital status: Widowed    Spouse name: N/A  . Number of children: N/A  . Years of education:  N/A   Occupational History  . Retired    Social History Main Topics  . Smoking status: Former Smoker    Packs/day: 1.50    Years: 30.00    Types: Cigarettes    Quit date: 11/15/1987  . Smokeless tobacco: Never Used  . Alcohol use No  . Drug use: No  . Sexual activity: Not on file   Other Topics Concern  . Not on file   Social History Narrative  . No narrative on file     Constitutional: Denies fever, malaise, fatigue, headache or abrupt weight changes.  HEENT: Denies eye pain, eye redness, ear pain, ringing in the ears, wax buildup, runny nose, nasal congestion, bloody nose, or sore throat. Respiratory: Pt reports cough. Denies difficulty breathing, shortness of breath.    No other specific complaints in a complete review of systems (except as listed in  HPI above).     Objective:   Physical Exam   BP 134/82   Pulse 77   Temp 97.8 F (36.6 C) (Oral)   Wt 188 lb 8 oz (85.5 kg)   SpO2 97%   BMI 32.36 kg/m  Wt Readings from Last 3 Encounters:  05/23/17 188 lb 8 oz (85.5 kg)  04/26/17 188 lb (85.3 kg)  03/09/17 187 lb (84.8 kg)    General: Appears her stated age, chronically in NAD. HEENT: Throat/Mouth: Teeth present, mucosa pink and moist, no exudate, lesions or ulcerations noted.  Neck:  No adenopathy noted Pulmonary/Chest: Normal effort and clear but diminished breath sounds. No respiratory distress. No wheezes, rales or ronchi noted.   BMET    Component Value Date/Time   NA 137 09/01/2016 1400   K 4.3 09/01/2016 1400   CL 101 09/01/2016 1400   CO2 27 09/01/2016 1400   GLUCOSE 199 (H) 09/01/2016 1400   BUN 27 (H) 09/01/2016 1400   CREATININE 0.85 09/01/2016 1400   CALCIUM 9.3 09/01/2016 1400    Lipid Panel     Component Value Date/Time   CHOL 175 09/01/2016 1400   TRIG 181.0 (H) 09/01/2016 1400   HDL 48.10 09/01/2016 1400   CHOLHDL 4 09/01/2016 1400   VLDL 36.2 09/01/2016 1400   LDLCALC 91 09/01/2016 1400    CBC    Component Value Date/Time   WBC 7.1 09/01/2016 1400   RBC 4.47 09/01/2016 1400   HGB 13.8 09/01/2016 1400   HCT 40.9 09/01/2016 1400   PLT 312.0 09/01/2016 1400   MCV 91.6 09/01/2016 1400   MCHC 33.8 09/01/2016 1400   RDW 13.6 09/01/2016 1400    Hgb A1C Lab Results  Component Value Date   HGBA1C 6.8 (H) 09/01/2016           Assessment & Plan:   COPD Exacerbation:  Some improvement No need for additional abx I had a long discussion with her about COPD/asthma and this this cough may be chronic from here on out Continue Prednisone RX for Tussionex provided Continue Breo, Albuterol, Xyzal and Nasonex  Return precautions discussed Webb Silversmith, NP

## 2017-06-26 ENCOUNTER — Other Ambulatory Visit: Payer: Self-pay | Admitting: Internal Medicine

## 2017-06-29 ENCOUNTER — Ambulatory Visit (INDEPENDENT_AMBULATORY_CARE_PROVIDER_SITE_OTHER): Payer: Medicare Other | Admitting: Pulmonary Disease

## 2017-06-29 ENCOUNTER — Encounter: Payer: Self-pay | Admitting: Pulmonary Disease

## 2017-06-29 VITALS — BP 140/92 | HR 82 | Ht 64.0 in

## 2017-06-29 DIAGNOSIS — R05 Cough: Secondary | ICD-10-CM

## 2017-06-29 DIAGNOSIS — R053 Chronic cough: Secondary | ICD-10-CM

## 2017-06-29 DIAGNOSIS — J449 Chronic obstructive pulmonary disease, unspecified: Secondary | ICD-10-CM | POA: Diagnosis not present

## 2017-06-29 MED ORDER — HYDROCOD POLST-CPM POLST ER 10-8 MG/5ML PO SUER: 5.0000 mL | Freq: Every evening | ORAL | 0 refills | Status: DC | PRN

## 2017-06-29 MED ORDER — TIOTROPIUM BROMIDE MONOHYDRATE 18 MCG IN CAPS: 18.0000 ug | ORAL_CAPSULE | Freq: Every day | RESPIRATORY_TRACT | 5 refills | Status: DC

## 2017-06-29 MED ORDER — BENZONATATE 200 MG PO CAPS
200.0000 mg | ORAL_CAPSULE | Freq: Four times a day (QID) | ORAL | 0 refills | Status: DC | PRN
Start: 2017-06-29 — End: 2017-08-14

## 2017-06-29 NOTE — Patient Instructions (Signed)
Discontinue Breo inhaler  Begin Spiriva inhaler - one inhalation daily  Tessalon Perles - 1 pill every 6 hours as needed for cough  I have refilled Tussionex - 1 teaspoon at night as needed for cough  Follow-up in 3 months

## 2017-07-01 NOTE — Progress Notes (Signed)
Pt profile: 83 y.o. F moved from [place]. Initially seen by Dr Melvyn Novas. Diagnosed with COPD in 2011. Graded as Gold I by Dr Melvyn Novas. Chronic prednisone therapy  PROBLEMS: Gold I COPD Chronic refractory cough  DATA: 10/22/14 PFTs: mild obstruction, FEV1 1.36 L (81%), FEV1/FVC 53%, TLC normal, DLCO 51% pred 02/16/16 PFTs: FVC: 2.23 L (90 %pred), FEV1: 1.30 L (71 %pred), FEV1/FVC: 58% , TLC: invalid, DLCO 78% pred, 02/14/17 CXR: chronic changes of R apical and lingular scarring  Subj: This is a routine reevaluation for COPD and chronic cough. She continues to have mild-moderate intermittent nonproductive cough and mild to moderate exertional dyspnea, neither of which has changed significantly since last evaluation. She took Nexium for one month without improvement in her cough. She has an albuterol nebulizer at home which helps her shortness of breath but does not help cough. Since last visit, she has discontinued a azithromycin as she did not believe that it was helping her in any way. She has never tried [employer]. She has a friend or family member who uses Singulair and wonders whether this would be of benefit to her. The only thing that helps her cough is Tussionex which she uses intermittently at bedtime. Denies CP, fever, purulent sputum, hemoptysis, LE edema and calf tenderness.  Her current medications of Breo, PRN albuterol. She is not on a LAMA  Obj: Vitals:   06/29/17 1004 06/29/17 1007  BP:  (!) 140/92  Pulse:  82  SpO2:  95%  Height: 5\' 4"  (1.626 m)   Room air  Gen: NAD HEENT: WNL Neck: No JVD Chest: slightly coarse BS, no wheezes Cardiac: Reg, no M WUJ:WJXBJ, soft, NT, +BS Ext: No C/C//E  BMET    Component Value Date/Time   NA 137 09/01/2016 1400   K 4.3 09/01/2016 1400   CL 101 09/01/2016 1400   CO2 27 09/01/2016 1400   GLUCOSE 199 (H) 09/01/2016 1400   BUN 27 (H) 09/01/2016 1400   CREATININE 0.85 09/01/2016 1400   CALCIUM 9.3 09/01/2016 1400    CBC     Component Value Date/Time   WBC 7.1 09/01/2016 1400   RBC 4.47 09/01/2016 1400   HGB 13.8 09/01/2016 1400   HCT 40.9 09/01/2016 1400   PLT 312.0 09/01/2016 1400   MCV 91.6 09/01/2016 1400   MCHC 33.8 09/01/2016 1400   RDW 13.6 09/01/2016 1400    CXR: NNF   IMPRESSION: 1) Mild COPD, with dyspnea out of proportion 2) Chronic bronchitis 3) Chronic cough of unclear etiology - it is possible that Breo (ICS) is exacerbating her cough by causing upper airway irritation.  PLAN/RECS:  Discontinue Breo inhaler Begin Spiriva inhaler- 1 inhalation daily Tessalon Perles (200 mg) - one by mouth every 6 hours PRN cough Continue Xyzal, Nasonex and albuterol as needed I have provided Rx for Tussionex to be used as needed at bedtime Follow-up in 3 months   Merton Border, MD PCCM service Mobile [phone] Pager [phone]  07/01/2017

## 2017-07-24 ENCOUNTER — Other Ambulatory Visit: Payer: Self-pay | Admitting: *Deleted

## 2017-07-24 MED ORDER — METFORMIN HCL 500 MG PO TABS: 500.0000 mg | ORAL_TABLET | Freq: Every day | ORAL | 1 refills | Status: DC

## 2017-07-24 NOTE — Telephone Encounter (Signed)
Tom pharmacist at [facility], [place]. 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 Tom requested that script be sent to [facility].  Refill sent to pharmacy per pharmacist request.

## 2017-08-05 ENCOUNTER — Other Ambulatory Visit: Payer: Self-pay | Admitting: Internal Medicine

## 2017-08-07 ENCOUNTER — Ambulatory Visit (INDEPENDENT_AMBULATORY_CARE_PROVIDER_SITE_OTHER): Payer: Medicare Other | Admitting: Internal Medicine

## 2017-08-07 ENCOUNTER — Encounter: Payer: Self-pay | Admitting: Internal Medicine

## 2017-08-07 VITALS — BP 136/80 | HR 84 | Temp 97.7°F | Wt 194.5 lb

## 2017-08-07 DIAGNOSIS — J441 Chronic obstructive pulmonary disease with (acute) exacerbation: Secondary | ICD-10-CM

## 2017-08-07 MED ORDER — HYDROCOD POLST-CPM POLST ER 10-8 MG/5ML PO SUER: 5.0000 mL | Freq: Every evening | ORAL | 0 refills | Status: DC | PRN

## 2017-08-07 MED ORDER — PREDNISONE 10 MG PO TABS: ORAL_TABLET | ORAL | 0 refills | Status: DC

## 2017-08-07 NOTE — Patient Instructions (Signed)
Bronchospasm, Adult Bronchospasm is a tightening of the airways going into the lungs. During an episode, it may be harder to breathe. You may cough, and you may make a whistling sound when you breathe (wheeze). This condition often affects people with asthma. What are the causes? This condition is caused by swelling and irritation in the airways. It can be triggered by:  An infection (common).  Seasonal allergies.  An allergic reaction.  Exercise.  Irritants. These include pollution, cigarette smoke, strong odors, aerosol sprays, and paint fumes.  Weather changes. Winds increase molds and pollens in the air. Cold air may cause swelling.  Stress and emotional upset.  What are the signs or symptoms? Symptoms of this condition include:  Wheezing. If the episode was triggered by an allergy, wheezing may start right away or hours later.  Nighttime coughing.  Frequent or severe coughing with a simple cold.  Chest tightness.  Shortness of breath.  Decreased ability to exercise.  How is this diagnosed? This condition is usually diagnosed with a review of your medical history and a physical exam. Tests, such as lung function tests, are sometimes done to look for other conditions. The need for a chest X-ray depends on where the wheezing occurs and whether it is the first time you have wheezed. How is this treated? This condition may be treated with:  Inhaled medicines. These open up the airways and help you breathe. They can be taken with an inhaler or a nebulizer device.  Corticosteroid medicines. These may be given for severe bronchospasm, usually when it is associated with asthma.  Avoiding triggers, such as irritants, infection, or allergies.  Follow these instructions at home: Medicines  Take over-the-counter and prescription medicines only as told by your health care provider.  If you need to use an inhaler or nebulizer to take your medicine, ask your health care  provider to explain how to use it correctly. If you were given a spacer, always use it with your inhaler. Lifestyle  Reduce the number of triggers in your home. To do this: ? Change your heating and air conditioning filter at least once a month. ? Limit your use of fireplaces and wood stoves. ? Do not smoke. Do not allow smoking in your home. ? Avoid using perfumes and fragrances. ? Get rid of pests, such as roaches and mice, and their droppings. ? Remove any mold from your home. ? Keep your house clean and dust free. Use unscented cleaning products. ? Replace carpet with wood, tile, or vinyl flooring. Carpet can trap dander and dust. ? Use allergy-proof pillows, mattress covers, and box spring covers. ? Wash bed sheets and blankets every week in hot water. Dry them in a dryer. ? Use blankets that are made of polyester or cotton. ? Wash your hands often. ? Do not allow pets in your bedroom.  Avoid breathing in cold air when you exercise. General instructions  Have a plan for seeking medical care. Know when to call your health care provider and local emergency services, and where to get emergency care.  Stay up to date on your immunizations.  When you have an episode of bronchospasm, stay calm. Try to relax and breathe more slowly.  If you have asthma, make sure you have an asthma action plan.  Keep all follow-up visits as told by your health care provider. This is important. Contact a health care provider if:  You have muscle aches.  You have chest pain.  The mucus that you   cough up (sputum) changes from clear or white to yellow, green, gray, or bloody.  You have a fever.  Your sputum gets thicker. Get help right away if:  Your wheezing and coughing get worse, even after you take your prescribed medicines.  It gets even harder to breathe.  You develop severe chest pain. Summary  Bronchospasm is a tightening of the airways going into the lungs.  During an episode of  bronchospasm, you may have a harder time breathing. You may cough and make a whistling sound when you breathe (wheeze).  Avoid exposure to triggers such as smoke, dust, mold, animal dander, and fragrances.  When you have an episode of bronchospasm, stay calm. Try to relax and breathe more slowly. This information is not intended to replace advice given to you by your health care provider. Make sure you discuss any questions you have with your health care provider. Document Released: 11/03/2003 Document Revised: 10/27/2016 Document Reviewed: 10/27/2016 Elsevier Interactive Patient Education  2017 Elsevier Inc.  

## 2017-08-07 NOTE — Progress Notes (Signed)
Subjective:    Patient ID: Megan Bradshaw, female    DOB: [birth year], 83 y.o.   MRN: [id number]  HPI  Pt presents to the clinic today with c/o a cough. She reports this started. The cough is nonproductive. She denies runny nose, nasal congestion or ear pain. She is mildly short of breath, but this is her baseline. She denies chest pain. She denies fever ,chills or body aches. She reports her pulmonologist recently changed her maintenance inhaler to Spiriva, and she does not feel like it is working. She has also tried Mucinex, Prednisone and neb treatments without any relief. She has not had sick contacts that she is aware of.   Review of Systems  Past Medical History:  Diagnosis Date  . Arthritis   . Asthma   . COPD (chronic obstructive pulmonary disease) ([place])   . Diabetes ([place])   . Thyroid disease     Current Outpatient Prescriptions  Medication Sig Dispense Refill  . albuterol (PROVENTIL) (2.5 MG/3ML) 0.083% nebulizer solution Take 3 mLs (2.5 mg total) by nebulization every 4 (four) hours as needed for wheezing or shortness of breath. 150 mL 5  . benzonatate (TESSALON) 200 MG capsule Take 1 capsule (200 mg total) by mouth every 6 (six) hours as needed for cough. 20 capsule 0  . chlorpheniramine-HYDROcodone (TUSSIONEX PENNKINETIC ER) 10-8 MG/5ML SUER Take 5 mLs by mouth at bedtime as needed. 140 mL 0  . Cholecalciferol (VITAMIN D PO) Take 1 tablet by mouth daily.    . clobetasol cream (TEMOVATE) 1.93 % Apply 1 application topically 2 (two) times daily. 30 g 2  . glipiZIDE (GLUCOTROL XL) 5 MG 24 hr tablet Take 1 tablet (5 mg total) by mouth daily with breakfast. 90 tablet 1  . levocetirizine (XYZAL) 5 MG tablet take 1 tablet by mouth once daily if needed 30 tablet 3  . levothyroxine (SYNTHROID, LEVOTHROID) 75 MCG tablet take 1 tablet by mouth once daily BEFORE BREAKFAST 90 tablet 1  . meloxicam (MOBIC) 15 MG tablet Take 1 tablet (15 mg total) by mouth every other day. 15 tablet 1  .  metFORMIN (GLUCOPHAGE) 500 MG tablet Take 1 tablet (500 mg total) by mouth daily with breakfast. 90 tablet 1  . mometasone (NASONEX) 50 MCG/ACT nasal spray Place 2 sprays into the nose daily. 17 g 12  . Multiple Vitamin (MULTIVITAMIN) capsule Take 1 capsule by mouth daily.    Marland Kitchen nystatin (MYCOSTATIN/NYSTOP) powder Apply topically 2 (two) times daily. 30 g 0  . oxybutynin (DITROPAN-XL) 5 MG 24 hr tablet Take 1 tablet (5 mg total) by mouth at bedtime. 30 tablet 2  . tiotropium (SPIRIVA HANDIHALER) 18 MCG inhalation capsule Place 1 capsule (18 mcg total) into inhaler and inhale daily. 30 capsule 5  . VENTOLIN HFA 108 (90 Base) MCG/ACT inhaler inhale 1 to 2 puffs by mouth every 6 hours pain for wheezing or shortness of breath 18 g 0   No current facility-administered medications for this visit.     No Known Allergies  Family History  Problem Relation Age of Onset  . Arthritis Father   . Diabetes Sister   . Diabetes Daughter   . Cancer Neg Hx   . Heart disease Neg Hx   . Stroke Neg Hx     Social History   Social History  . Marital status: Widowed    Spouse name: N/A  . Number of children: N/A  . Years of education: N/A   Occupational History  .  Retired    Social History Main Topics  . Smoking status: Former Smoker    Packs/day: 1.50    Years: 30.00    Types: Cigarettes    Quit date: 11/15/1987  . Smokeless tobacco: Never Used  . Alcohol use No  . Drug use: No  . Sexual activity: Not on file   Other Topics Concern  . Not on file   Social History Narrative  . No narrative on file     Constitutional: Denies fever, malaise, fatigue, headache or abrupt weight changes.  HEENT: Denies eye pain, eye redness, ear pain, ringing in the ears, wax buildup, runny nose, nasal congestion, bloody nose, or sore throat. Respiratory: Pt reports cough and shortness of breath. Denies difficulty breathing, or sputum production.   Cardiovascular: Denies chest pain, chest tightness,  palpitations or swelling in the hands or feet.    No other specific complaints in a complete review of systems (except as listed in HPI above).     Objective:   Physical Exam    BP 136/80   Pulse 84   Temp 97.7 F (36.5 C) (Oral)   Wt 194 lb 8 oz (88.2 kg)   SpO2 96%   BMI 33.39 kg/m  Wt Readings from Last 3 Encounters:  08/07/17 194 lb 8 oz (88.2 kg)  05/23/17 188 lb 8 oz (85.5 kg)  04/26/17 188 lb (85.3 kg)    General: Appears her stated age, in NAD. HEENT: Head: normal shape and size;Throat/Mouth: Teeth present, mucosa pink and moist, no exudate, lesions or ulcerations noted.  Neck:  No adenopathy. Pulmonary/Chest: Normal effort and positive vesicular breath sounds with bilateral expiratory wheezing. No respiratory distress. No rales or ronchi noted.    BMET    Component Value Date/Time   NA 137 09/01/2016 1400   K 4.3 09/01/2016 1400   CL 101 09/01/2016 1400   CO2 27 09/01/2016 1400   GLUCOSE 199 (H) 09/01/2016 1400   BUN 27 (H) 09/01/2016 1400   CREATININE 0.85 09/01/2016 1400   CALCIUM 9.3 09/01/2016 1400    Lipid Panel     Component Value Date/Time   CHOL 175 09/01/2016 1400   TRIG 181.0 (H) 09/01/2016 1400   HDL 48.10 09/01/2016 1400   CHOLHDL 4 09/01/2016 1400   VLDL 36.2 09/01/2016 1400   LDLCALC 91 09/01/2016 1400    CBC    Component Value Date/Time   WBC 7.1 09/01/2016 1400   RBC 4.47 09/01/2016 1400   HGB 13.8 09/01/2016 1400   HCT 40.9 09/01/2016 1400   PLT 312.0 09/01/2016 1400   MCV 91.6 09/01/2016 1400   MCHC 33.8 09/01/2016 1400   RDW 13.6 09/01/2016 1400    Hgb A1C Lab Results  Component Value Date   HGBA1C 6.8 (H) 09/01/2016          Assessment & Plan:   COPD Exacerbation:  Hold regular Prednisone eRx for Pred Taper RX for Tussionex Consider talking to pulm about changing your Spiriva to an inhaler with an ICS  Return precautions discussed Webb Silversmith, NP

## 2017-08-14 ENCOUNTER — Ambulatory Visit (INDEPENDENT_AMBULATORY_CARE_PROVIDER_SITE_OTHER): Payer: Medicare Other | Admitting: Internal Medicine

## 2017-08-14 ENCOUNTER — Encounter: Payer: Self-pay | Admitting: Internal Medicine

## 2017-08-14 VITALS — BP 134/80 | HR 77 | Temp 97.7°F

## 2017-08-14 DIAGNOSIS — J449 Chronic obstructive pulmonary disease, unspecified: Secondary | ICD-10-CM | POA: Diagnosis not present

## 2017-08-14 DIAGNOSIS — R5383 Other fatigue: Secondary | ICD-10-CM | POA: Diagnosis not present

## 2017-08-14 DIAGNOSIS — T380X5A Adverse effect of glucocorticoids and synthetic analogues, initial encounter: Secondary | ICD-10-CM

## 2017-08-14 DIAGNOSIS — R05 Cough: Secondary | ICD-10-CM

## 2017-08-14 DIAGNOSIS — Z23 Encounter for immunization: Secondary | ICD-10-CM | POA: Diagnosis not present

## 2017-08-14 DIAGNOSIS — E538 Deficiency of other specified B group vitamins: Secondary | ICD-10-CM

## 2017-08-14 DIAGNOSIS — E099 Drug or chemical induced diabetes mellitus without complications: Secondary | ICD-10-CM

## 2017-08-14 DIAGNOSIS — E559 Vitamin D deficiency, unspecified: Secondary | ICD-10-CM

## 2017-08-14 DIAGNOSIS — R053 Chronic cough: Secondary | ICD-10-CM

## 2017-08-14 LAB — HEMOGLOBIN A1C: Hgb A1c MFr Bld: 8.4 % — ABNORMAL HIGH (ref 4.6–6.5)

## 2017-08-14 LAB — VITAMIN D 25 HYDROXY (VIT D DEFICIENCY, FRACTURES): VITD: 29.94 ng/mL — ABNORMAL LOW (ref 30.00–100.00)

## 2017-08-14 LAB — VITAMIN B12: Vitamin B-12: 440 pg/mL (ref 211–911)

## 2017-08-14 MED ORDER — BENZONATATE 200 MG PO CAPS: 200.0000 mg | ORAL_CAPSULE | Freq: Four times a day (QID) | ORAL | 0 refills | Status: DC | PRN

## 2017-08-14 NOTE — Addendum Note (Signed)
Addended by: Lurlean Nanny on: 08/14/2017 04:56 PM   Modules accepted: Orders

## 2017-08-14 NOTE — Patient Instructions (Signed)

## 2017-08-14 NOTE — Progress Notes (Signed)
Subjective:    Patient ID: Megan Bradshaw, female    DOB: 1930/01/02, 83 y.o.   MRN: [id number]  HPI  Pt presents to the clinic today with c/o fatigue. She reports this is persistent, not worse. She sleeps well at night. She denies feeling overly anxious or depressed. She thinks some of her vitamin levels may be low and would like to get them checked today.  She would also like her flu shot today.  She is also requesting a refill on Benzonate. She takes this as needed for her chronic cough associated with COPD.  She would also like her A1C rechecked. She has steroid induced diabetes. She is taking Metformin as prescribed. She does not check her sugars because she it hurts her fingers.  Review of Systems      Past Medical History:  Diagnosis Date  . Arthritis   . Asthma   . COPD (chronic obstructive pulmonary disease) ([place])   . Diabetes ([place])   . Thyroid disease     Current Outpatient Prescriptions  Medication Sig Dispense Refill  . albuterol (PROVENTIL) (2.5 MG/3ML) 0.083% nebulizer solution Take 3 mLs (2.5 mg total) by nebulization every 4 (four) hours as needed for wheezing or shortness of breath. 150 mL 5  . benzonatate (TESSALON) 200 MG capsule Take 1 capsule (200 mg total) by mouth every 6 (six) hours as needed for cough. 30 capsule 0  . chlorpheniramine-HYDROcodone (TUSSIONEX PENNKINETIC ER) 10-8 MG/5ML SUER Take 5 mLs by mouth at bedtime as needed. 140 mL 0  . Cholecalciferol (VITAMIN D PO) Take 1 tablet by mouth daily.    . clobetasol cream (TEMOVATE) 6.19 % Apply 1 application topically 2 (two) times daily. 30 g 2  . glipiZIDE (GLUCOTROL XL) 5 MG 24 hr tablet Take 1 tablet (5 mg total) by mouth daily with breakfast. 90 tablet 1  . levocetirizine (XYZAL) 5 MG tablet take 1 tablet by mouth once daily if needed 30 tablet 3  . levothyroxine (SYNTHROID, LEVOTHROID) 75 MCG tablet take 1 tablet by mouth once daily BEFORE BREAKFAST 90 tablet 1  . meloxicam (MOBIC) 15 MG  tablet Take 1 tablet (15 mg total) by mouth every other day. 15 tablet 1  . metFORMIN (GLUCOPHAGE) 500 MG tablet Take 1 tablet (500 mg total) by mouth daily with breakfast. 90 tablet 1  . mometasone (NASONEX) 50 MCG/ACT nasal spray Place 2 sprays into the nose daily. 17 g 12  . Multiple Vitamin (MULTIVITAMIN) capsule Take 1 capsule by mouth daily.    Marland Kitchen nystatin (MYCOSTATIN/NYSTOP) powder Apply topically 2 (two) times daily. 30 g 0  . oxybutynin (DITROPAN-XL) 5 MG 24 hr tablet Take 1 tablet (5 mg total) by mouth at bedtime. 30 tablet 2  . predniSONE (DELTASONE) 10 MG tablet Take 3 tabs on days 1-2, take 2 tabs on days 3-4, take 1 tab on days 5-6 12 tablet 0  . tiotropium (SPIRIVA HANDIHALER) 18 MCG inhalation capsule Place 1 capsule (18 mcg total) into inhaler and inhale daily. 30 capsule 5  . VENTOLIN HFA 108 (90 Base) MCG/ACT inhaler inhale 1 to 2 puffs by mouth every 6 hours pain for wheezing or shortness of breath 18 g 0   No current facility-administered medications for this visit.     No Known Allergies  Family History  Problem Relation Age of Onset  . Arthritis Father   . Diabetes Sister   . Diabetes Daughter   . Cancer Neg Hx   . Heart disease Neg  Hx   . Stroke Neg Hx     Social History   Social History  . Marital status: Widowed    Spouse name: N/A  . Number of children: N/A  . Years of education: N/A   Occupational History  . Retired    Social History Main Topics  . Smoking status: Former Smoker    Packs/day: 1.50    Years: 30.00    Types: Cigarettes    Quit date: 11/15/1987  . Smokeless tobacco: Never Used  . Alcohol use No  . Drug use: No  . Sexual activity: Not on file   Other Topics Concern  . Not on file   Social History Narrative  . No narrative on file     Constitutional: Pt reports fatigue. Denies fever, malaise, headache or abrupt weight changes.  HEENT: Pt reports runny nose. Denies eye pain, eye redness, ear pain, ringing in the ears, wax  buildup, nasal congestion, bloody nose, or sore throat. Respiratory: Pt reports cough. Denies difficulty breathing, shortness of breath.   Cardiovascular: Denies chest pain, chest tightness, palpitations or swelling in the hands or feet.  Neurological: Denies dizziness, difficulty with memory, difficulty with speech or problems with balance and coordination.   No other specific complaints in a complete review of systems (except as listed in HPI above).  Objective:   Physical Exam  BP 134/80   Pulse 77   Temp 97.7 F (36.5 C) (Oral)   SpO2 97%  Wt Readings from Last 3 Encounters:  08/07/17 194 lb 8 oz (88.2 kg)  05/23/17 188 lb 8 oz (85.5 kg)  04/26/17 188 lb (85.3 kg)    General: Appears her stated age, chronically ill appearing, in NAD. Skin: No ulcerations noted. Cardiovascular: Normal rate and rhythm. S1,S2 noted.  No murmur, rubs or gallops noted.  Pulmonary/Chest: Normal effort and positive vesicular breath sounds. No respiratory distress. No wheezes, rales or ronchi noted.  Neurological: Alert and oriented.    BMET    Component Value Date/Time   NA 137 09/01/2016 1400   K 4.3 09/01/2016 1400   CL 101 09/01/2016 1400   CO2 27 09/01/2016 1400   GLUCOSE 199 (H) 09/01/2016 1400   BUN 27 (H) 09/01/2016 1400   CREATININE 0.85 09/01/2016 1400   CALCIUM 9.3 09/01/2016 1400    Lipid Panel     Component Value Date/Time   CHOL 175 09/01/2016 1400   TRIG 181.0 (H) 09/01/2016 1400   HDL 48.10 09/01/2016 1400   CHOLHDL 4 09/01/2016 1400   VLDL 36.2 09/01/2016 1400   LDLCALC 91 09/01/2016 1400    CBC    Component Value Date/Time   WBC 7.1 09/01/2016 1400   RBC 4.47 09/01/2016 1400   HGB 13.8 09/01/2016 1400   HCT 40.9 09/01/2016 1400   PLT 312.0 09/01/2016 1400   MCV 91.6 09/01/2016 1400   MCHC 33.8 09/01/2016 1400   RDW 13.6 09/01/2016 1400    Hgb A1C Lab Results  Component Value Date   HGBA1C 6.8 (H) 09/01/2016          Assessment & Plan:    Fatigue:  Will check B12, and Vit D today  Chronic Cough, COPD:  Benzonate refilled today Flu shot today  DM 2:  A1C today Continue Metformin for now, will adjust if needed  RTC as needed or if symptoms persist or worsen, will follow up after labs  Webb Silversmith, NP

## 2017-08-16 MED ORDER — METFORMIN HCL 500 MG PO TABS: 500.0000 mg | ORAL_TABLET | Freq: Two times a day (BID) | ORAL | 0 refills | Status: DC

## 2017-08-16 NOTE — Addendum Note (Signed)
Addended by: Lurlean Nanny on: 08/16/2017 10:49 AM   Modules accepted: Orders

## 2017-08-17 MED ORDER — METFORMIN HCL 500 MG PO TABS: 500.0000 mg | ORAL_TABLET | Freq: Two times a day (BID) | ORAL | 0 refills | Status: DC

## 2017-08-17 MED ORDER — BENZONATATE 200 MG PO CAPS: 200.0000 mg | ORAL_CAPSULE | Freq: Four times a day (QID) | ORAL | 0 refills | Status: DC | PRN

## 2017-08-18 ENCOUNTER — Ambulatory Visit: Payer: Medicare Other

## 2017-08-24 ENCOUNTER — Telehealth: Payer: Self-pay

## 2017-08-24 NOTE — Telephone Encounter (Signed)
Call from pt requesting refill for prednisone 5 mg.  However, there is only 10 mg in her med list. She says the 10 was only prescribed recently but is asking for refill on the 5 mg rx sent to [facility] on [address].

## 2017-08-27 ENCOUNTER — Other Ambulatory Visit: Payer: Self-pay | Admitting: Internal Medicine

## 2017-08-28 NOTE — Telephone Encounter (Signed)
Medication sent per request.

## 2017-08-28 NOTE — Telephone Encounter (Signed)
Pt left v/m requesting cb about refill request for prednisone 5 mg to [facility] [address]. See phone note dated 08/24/17. Pt last got prednisone 10 mg # 12 on 08/07/17. And pt last seen 08/14/17.Please advise.

## 2017-08-29 NOTE — Telephone Encounter (Signed)
Megan Bradshaw approved Rx

## 2017-09-12 ENCOUNTER — Other Ambulatory Visit: Payer: Self-pay | Admitting: Internal Medicine

## 2017-09-19 DIAGNOSIS — L820 Inflamed seborrheic keratosis: Secondary | ICD-10-CM | POA: Diagnosis not present

## 2017-09-19 DIAGNOSIS — D229 Melanocytic nevi, unspecified: Secondary | ICD-10-CM | POA: Diagnosis not present

## 2017-09-19 DIAGNOSIS — L814 Other melanin hyperpigmentation: Secondary | ICD-10-CM | POA: Diagnosis not present

## 2017-09-19 DIAGNOSIS — L821 Other seborrheic keratosis: Secondary | ICD-10-CM | POA: Diagnosis not present

## 2017-09-19 DIAGNOSIS — D692 Other nonthrombocytopenic purpura: Secondary | ICD-10-CM | POA: Diagnosis not present

## 2017-09-19 DIAGNOSIS — D1800 Hemangioma unspecified site: Secondary | ICD-10-CM | POA: Diagnosis not present

## 2017-09-19 DIAGNOSIS — Z1283 Encounter for screening for malignant neoplasm of skin: Secondary | ICD-10-CM | POA: Diagnosis not present

## 2017-09-20 ENCOUNTER — Other Ambulatory Visit
Admission: RE | Admit: 2017-09-20 | Discharge: 2017-09-20 | Disposition: A | Discharge status: Home / Self Care | Payer: Medicare Other | Source: Ambulatory Visit | Attending: Pulmonary Disease | Admitting: Pulmonary Disease

## 2017-09-20 ENCOUNTER — Encounter: Payer: Self-pay | Admitting: Pulmonary Disease

## 2017-09-20 ENCOUNTER — Ambulatory Visit (INDEPENDENT_AMBULATORY_CARE_PROVIDER_SITE_OTHER): Payer: Medicare Other | Admitting: Pulmonary Disease

## 2017-09-20 VITALS — BP 148/100 | HR 88 | Resp 16 | Ht 64.0 in | Wt 192.0 lb

## 2017-09-20 DIAGNOSIS — R059 Cough, unspecified: Secondary | ICD-10-CM

## 2017-09-20 DIAGNOSIS — R0600 Dyspnea, unspecified: Secondary | ICD-10-CM

## 2017-09-20 DIAGNOSIS — R05 Cough: Secondary | ICD-10-CM

## 2017-09-20 DIAGNOSIS — J449 Chronic obstructive pulmonary disease, unspecified: Secondary | ICD-10-CM

## 2017-09-20 LAB — BRAIN NATRIURETIC PEPTIDE: B Natriuretic Peptide: 33 pg/mL (ref 0.0–100.0)

## 2017-09-20 MED ORDER — FLUTICASONE FUROATE-VILANTEROL 200-25 MCG/INH IN AEPB: 1.0000 | INHALATION_SPRAY | Freq: Every day | RESPIRATORY_TRACT | Status: DC

## 2017-09-20 NOTE — Patient Instructions (Signed)
Resume Breo inhaler - one inhalation daily. Three month pescription entered to [facility] mail-in Continue Spiriva inhaler (for now) as previously prescribed Blood test today - BNP - this is a screening test to look for congestive heart failure Follow up in 6 weeks

## 2017-09-20 NOTE — Addendum Note (Signed)
Addended by: Oscar La R on: 09/20/2017 11:15 AM   Modules accepted: Orders

## 2017-09-20 NOTE — Progress Notes (Signed)
Pt profile: 83 y.o. F moved from [place]. Initially seen by Dr Melvyn Novas. Diagnosed with COPD in 2011. Graded as Gold I by Dr Melvyn Novas. Chronic prednisone therapy  PROBLEMS: Gold I COPD Chronic refractory cough  DATA: 10/22/14 PFTs: mild obstruction, FEV1 1.36 L (81%), FEV1/FVC 53%, TLC normal, DLCO 51% pred 02/16/16 PFTs: FVC: 2.23 L (90 %pred), FEV1: 1.30 L (71 %pred), FEV1/FVC: 58% , TLC: invalid, DLCO 78% pred, 02/14/17 CXR: chronic changes of R apical and lingular scarring  Subj: This is a routine reevaluation for COPD and chronic cough.  Last visit, we stopped Breo inhaler and started Spiriva with the thinking that the inhaled steroid might be exacerbating her cough.  Since that time she is worse with increased shortness of breath.  She notes increased dyspnea upon lying down in bed at night.  She continues to have chronic cough which is mostly nonproductive but rattling.  She is very concerned about her oxygen saturations at night.  She is convinced that she needs nocturnal oxygen.  She notes that she has been on nocturnal oxygen in the past and, for some reason, this was discontinued by another care provider.  She denies CP, fever, purulent sputum, hemoptysis, LE edema and calf tenderness.  Obj: Vitals:   09/20/17 0939 09/20/17 0941  BP:  (!) 148/100  Pulse:  88  Resp: 16   SpO2:  93%  Weight: 87.1 kg (192 lb)   Height: 5\' 4"  (1.626 m)   Room air  Gen: NAD HEENT: WNL Neck: No JVD Chest: Coarse BS throughout with scattered wheezes Cardiac: Reg, no M WOE:HOZYY, soft, NT, +BS Ext: No C/C//E  BMET    Component Value Date/Time   NA 137 09/01/2016 1400   K 4.3 09/01/2016 1400   CL 101 09/01/2016 1400   CO2 27 09/01/2016 1400   GLUCOSE 199 (H) 09/01/2016 1400   BUN 27 (H) 09/01/2016 1400   CREATININE 0.85 09/01/2016 1400   CALCIUM 9.3 09/01/2016 1400    CBC    Component Value Date/Time   WBC 7.1 09/01/2016 1400   RBC 4.47 09/01/2016 1400   HGB 13.8 09/01/2016 1400   HCT 40.9  09/01/2016 1400   PLT 312.0 09/01/2016 1400   MCV 91.6 09/01/2016 1400   MCHC 33.8 09/01/2016 1400   RDW 13.6 09/01/2016 1400    CXR: NNF   IMPRESSION: 1) Mild COPD, with chronic cough to chronic bronchitis.  2) nocturnal dyspnea  PLAN/RECS:  Resume Brio inhaler - one inhalation daily Continue Spiriva inhaler for now Check B natruretic peptide today.  If this is abnormal, we will consider echocardiogram Overnight oximetry ordered Follow-up in 6 weeks   Merton Border, MD PCCM service Mobile [phone] Pager [phone]  09/20/2017 11:02 AM

## 2017-09-25 ENCOUNTER — Telehealth: Payer: Self-pay | Admitting: Pulmonary Disease

## 2017-09-25 NOTE — Telephone Encounter (Signed)
Pt calling stating she did an exam and blood work  on Wednesday   She is calling to follow up on this  Please advise

## 2017-09-25 NOTE — Telephone Encounter (Signed)
Please advise on message below.

## 2017-09-26 MED ORDER — FLUTICASONE FUROATE-VILANTEROL 200-25 MCG/INH IN AEPB: 1.0000 | INHALATION_SPRAY | Freq: Every day | RESPIRATORY_TRACT | Status: DC

## 2017-09-26 MED ORDER — FLUTICASONE FUROATE-VILANTEROL 200-25 MCG/INH IN AEPB: 1.0000 | INHALATION_SPRAY | Freq: Every day | RESPIRATORY_TRACT | 0 refills | Status: DC

## 2017-09-26 NOTE — Telephone Encounter (Signed)
Pt informed of results. Nothing further needed. 

## 2017-09-26 NOTE — Telephone Encounter (Signed)
Had to resend Breo inhaler Rx as it was not sent to mail order, it was set to "print"

## 2017-09-26 NOTE — Telephone Encounter (Signed)
The blood test (BNP) to screen for congestive heart failure was normal. Therefore, we do not need to follow up with an echocardiogram as was discussed during her visit  Thanks  Waunita Schooner

## 2017-10-24 ENCOUNTER — Ambulatory Visit: Payer: Medicare Other | Admitting: Pulmonary Disease

## 2017-10-27 ENCOUNTER — Ambulatory Visit (INDEPENDENT_AMBULATORY_CARE_PROVIDER_SITE_OTHER): Payer: Medicare Other | Admitting: Internal Medicine

## 2017-10-27 ENCOUNTER — Encounter: Payer: Self-pay | Admitting: Internal Medicine

## 2017-10-27 VITALS — BP 136/82 | HR 90 | Temp 97.9°F

## 2017-10-27 DIAGNOSIS — B9789 Other viral agents as the cause of diseases classified elsewhere: Secondary | ICD-10-CM | POA: Diagnosis not present

## 2017-10-27 DIAGNOSIS — J069 Acute upper respiratory infection, unspecified: Secondary | ICD-10-CM | POA: Diagnosis not present

## 2017-10-27 MED ORDER — HYDROCODONE-HOMATROPINE 5-1.5 MG/5ML PO SYRP: 5.0000 mL | ORAL_SOLUTION | Freq: Three times a day (TID) | ORAL | 0 refills | Status: DC | PRN

## 2017-10-27 NOTE — Progress Notes (Signed)
HPI  Pt presents to the clinic today with c/o runny nose and cough. This started 2 days ago. She is blowing green mucous out of her nose. The cough is productive of green mucous. She denies fever, chills or body aches but has been fatigued. She has had some associated loose stools today but denies nausea, vomiting or blood in her stool. She has tried Mucinex and inhalers with minimal relief. She has not had sick contacts. She has a history of asthma and COPD.  Review of Systems      Past Medical History:  Diagnosis Date  . Arthritis   . Asthma   . COPD (chronic obstructive pulmonary disease) ([place])   . Diabetes ([place])   . Thyroid disease     Family History  Problem Relation Age of Onset  . Arthritis Father   . Diabetes Sister   . Diabetes Daughter   . Cancer Neg Hx   . Heart disease Neg Hx   . Stroke Neg Hx     Social History   Socioeconomic History  . Marital status: Widowed    Spouse name: Not on file  . Number of children: Not on file  . Years of education: Not on file  . Highest education level: Not on file  Social Needs  . Financial resource strain: Not on file  . Food insecurity - worry: Not on file  . Food insecurity - inability: Not on file  . Transportation needs - medical: Not on file  . Transportation needs - non-medical: Not on file  Occupational History  . Occupation: Retired  Tobacco Use  . Smoking status: Former Smoker    Packs/day: 1.50    Years: 30.00    Pack years: 45.00    Types: Cigarettes    Last attempt to quit: 11/15/1987    Years since quitting: 29.9  . Smokeless tobacco: Never Used  Substance and Sexual Activity  . Alcohol use: No    Alcohol/week: 0.0 oz  . Drug use: No  . Sexual activity: Not on file  Other Topics Concern  . Not on file  Social History Narrative  . Not on file    No Known Allergies   Constitutional: Positive fatigue. Denies headache, fever, or abrupt weight changes.  HEENT:  Positive runny nose. Denies eye  redness, eye pain, pressure behind the eyes, facial pain, nasal congestion, ear pain, ringing in the ears, wax buildup, or sore throat. Respiratory: Positive cough and shortness of breath. Denies difficulty breathing.  Cardiovascular: Denies chest pain, chest tightness, palpitations or swelling in the hands or feet.   No other specific complaints in a complete review of systems (except as listed in HPI above).  Objective:   Pulse 90   Temp 97.9 F (36.6 C) (Oral)   SpO2 94%  Wt Readings from Last 3 Encounters:  09/20/17 192 lb (87.1 kg)  08/07/17 194 lb 8 oz (88.2 kg)  05/23/17 188 lb 8 oz (85.5 kg)     General: Appears her stated age, in NAD. HEENT: Head: normal shape and size, no sinus tenderness noted; Ears: Tm's gray and intact, normal light reflex; Nose: mucosa boggy and moist, turbinates swollen; Throat/Mouth: + PND. Teeth present, mucosa pink and moist, no exudate noted, no lesions or ulcerations noted.  Neck: No cervical lymphadenopathy.  Cardiovascular: Normal rate and rhythm.  Pulmonary/Chest: Normal effort and positive vesicular breath sounds. No respiratory distress. No wheezes, rales or ronchi noted.       Assessment & Plan:  Viral Upper Respiratory Infection with Cough:  Flu test: negative Get some rest and drink plenty of water Do salt water gargles for the sore throat Continue Mucinex, add some Flonase eRx for Hycodan cough syrup  RTC as needed or if symptoms persist.   Webb Silversmith, NP

## 2017-10-27 NOTE — Patient Instructions (Signed)
Upper Respiratory Infection, Adult Most upper respiratory infections (URIs) are caused by a virus. A URI affects the nose, throat, and upper air passages. The most common type of URI is often called "the common cold." Follow these instructions at home:  Take medicines only as told by your doctor.  Gargle warm saltwater or take cough drops to comfort your throat as told by your doctor.  Use a warm mist humidifier or inhale steam from a shower to increase air moisture. This may make it easier to breathe.  Drink enough fluid to keep your pee (urine) clear or pale yellow.  Eat soups and other clear broths.  Have a healthy diet.  Rest as needed.  Go back to work when your fever is gone or your doctor says it is okay. ? You may need to stay home longer to avoid giving your URI to others. ? You can also wear a face mask and wash your hands often to prevent spread of the virus.  Use your inhaler more if you have asthma.  Do not use any tobacco products, including cigarettes, chewing tobacco, or electronic cigarettes. If you need help quitting, ask your doctor. Contact a doctor if:  You are getting worse, not better.  Your symptoms are not helped by medicine.  You have chills.  You are getting more short of breath.  You have brown or red mucus.  You have yellow or brown discharge from your nose.  You have pain in your face, especially when you bend forward.  You have a fever.  You have puffy (swollen) neck glands.  You have pain while swallowing.  You have white areas in the back of your throat. Get help right away if:  You have very bad or constant: ? Headache. ? Ear pain. ? Pain in your forehead, behind your eyes, and over your cheekbones (sinus pain). ? Chest pain.  You have long-lasting (chronic) lung disease and any of the following: ? Wheezing. ? Long-lasting cough. ? Coughing up blood. ? A change in your usual mucus.  You have a stiff neck.  You have  changes in your: ? Vision. ? Hearing. ? Thinking. ? Mood. This information is not intended to replace advice given to you by your health care provider. Make sure you discuss any questions you have with your health care provider. Document Released: 04/18/2008 Document Revised: 07/03/2016 Document Reviewed: 02/05/2014 Elsevier Interactive Patient Education  2018 Elsevier Inc.  

## 2017-10-30 ENCOUNTER — Telehealth: Payer: Self-pay

## 2017-10-30 NOTE — Telephone Encounter (Signed)
She has asthma/COPD. She is going to have a lot of mucous and cough. This is never going to go away. Can you call her and see what she wants to discuss?

## 2017-10-30 NOTE — Telephone Encounter (Signed)
Copied from [place] [id number]. Topic: General - Other >> Oct 30, 2017  9:56 AM Megan Bradshaw wrote:   Pt would like a call back she is having a lot of mucus and cough. Would like a call back cause she does not feel like coming in   [place] >> Oct 30, 2017  4:52 PM [name] wrote: Pt called back and said that what is coming out of her nose is now green and yellow.  She is coughing up dark green and yellow as well.  She has had a fever off and on.  She said that she is feeling worse from Friday .  She would like and antibiotic called in to help   Best number [phone]

## 2017-10-30 NOTE — Telephone Encounter (Signed)
Pt was seen 10/27/17.

## 2017-10-30 NOTE — Telephone Encounter (Signed)
Copied from [place] [id number]. Topic: General - Other >> Oct 30, 2017  9:56 AM [name] wrote:   Pt would like a call back she is having a lot of mucus and cough. Would like a call back cause she does not feel like coming in   [name]

## 2017-11-01 NOTE — Telephone Encounter (Signed)
Spoke to pt and she is aware as instructed

## 2017-11-01 NOTE — Telephone Encounter (Signed)
Patient is calling again, would like a call back today. Still not feeling well.

## 2017-11-16 ENCOUNTER — Ambulatory Visit (INDEPENDENT_AMBULATORY_CARE_PROVIDER_SITE_OTHER): Payer: Medicare Other | Admitting: Pulmonary Disease

## 2017-11-16 ENCOUNTER — Encounter: Payer: Self-pay | Admitting: Pulmonary Disease

## 2017-11-16 VITALS — BP 124/82 | HR 89 | Ht 64.0 in | Wt 190.0 lb

## 2017-11-16 DIAGNOSIS — J441 Chronic obstructive pulmonary disease with (acute) exacerbation: Secondary | ICD-10-CM | POA: Diagnosis not present

## 2017-11-16 DIAGNOSIS — J019 Acute sinusitis, unspecified: Secondary | ICD-10-CM

## 2017-11-16 MED ORDER — AMOXICILLIN-POT CLAVULANATE 875-125 MG PO TABS
1.0000 | ORAL_TABLET | Freq: Two times a day (BID) | ORAL | 0 refills | Status: DC
Start: 2017-11-16 — End: 2017-12-07

## 2017-11-16 MED ORDER — PREDNISONE 20 MG PO TABS: 40.0000 mg | ORAL_TABLET | Freq: Every day | ORAL | 0 refills | Status: AC

## 2017-11-16 NOTE — Patient Instructions (Addendum)
Continue Breo inhaler Augmentin 875 mg twice a day for 7 days Prednisone 40 mg daily for 5 days Call here if your symptoms do not get better with the above therapy Otherwise, follow-up in 3 months

## 2017-11-16 NOTE — Progress Notes (Signed)
Pt profile: 84 y.o. F moved from [place]. Initially seen by Dr Melvyn Novas. Diagnosed with COPD in 2011. Graded as Gold I by Dr Melvyn Novas. Chronic prednisone therapy  PROBLEMS: Gold I COPD -chronic asthmatic bronchitis Chronic refractory cough  DATA: 10/22/14 PFTs: mild obstruction, FEV1 1.36 L (81%), FEV1/FVC 53%, TLC normal, DLCO 51% pred 02/16/16 PFTs: FVC: 2.23 L (90 %pred), FEV1: 1.30 L (71 %pred), FEV1/FVC: 58% , TLC: invalid, DLCO 78% pred, 02/14/17 CXR: chronic changes of R apical and lingular scarring  Subj: Last seen 09/20/17 at which time we changed Spiriva back to [facility].  Since that time, in the past 3-4 weeks, she has developed nasal congestion, posterior nasal drainage, throat clearing cough with green mucus.  She denies fever, facial pressure or pain.  She has also noted some mild increase in shortness of breath.  Obj: Vitals:   11/16/17 0926 11/16/17 0931  BP:  124/82  Pulse:  89  SpO2:  92%  Weight: 86.2 kg (190 lb)   Height: 5\' 4"  (1.626 m)   Room air  Gen: NAD HEENT: WNL, severe rhinitis Neck: No JVD Chest: Coarse BS throughout with scattered wheezes Cardiac: Reg, no M QQP:YPPJK, soft, NT, +BS Ext: No C/C//E  BMET    Component Value Date/Time   NA 137 09/01/2016 1400   K 4.3 09/01/2016 1400   CL 101 09/01/2016 1400   CO2 27 09/01/2016 1400   GLUCOSE 199 (H) 09/01/2016 1400   BUN 27 (H) 09/01/2016 1400   CREATININE 0.85 09/01/2016 1400   CALCIUM 9.3 09/01/2016 1400    CBC    Component Value Date/Time   WBC 7.1 09/01/2016 1400   RBC 4.47 09/01/2016 1400   HGB 13.8 09/01/2016 1400   HCT 40.9 09/01/2016 1400   PLT 312.0 09/01/2016 1400   MCV 91.6 09/01/2016 1400   MCHC 33.8 09/01/2016 1400   RDW 13.6 09/01/2016 1400    CXR: NNF   IMPRESSION: 1) Mild COPD, with chronic asthmatic bronchitis 2) acute sinusitis  PLAN/RECS:  Continue Breo inhaler - one inhalation daily Augmentin 875 mg twice a day for 7 days Prednisone 40 mg a day for 5 days She is to  call here if her symptoms did not improve significantly with the above therapies Otherwise, follow-up in 3 months   Merton Border, MD PCCM service Mobile [phone] Pager [phone]  11/16/2017 9:55 AM

## 2017-12-07 ENCOUNTER — Encounter: Payer: Self-pay | Admitting: Internal Medicine

## 2017-12-07 ENCOUNTER — Ambulatory Visit (INDEPENDENT_AMBULATORY_CARE_PROVIDER_SITE_OTHER): Payer: Medicare Other | Admitting: Internal Medicine

## 2017-12-07 VITALS — BP 122/84 | HR 76 | Temp 97.9°F | Ht 64.0 in | Wt 188.0 lb

## 2017-12-07 DIAGNOSIS — E099 Drug or chemical induced diabetes mellitus without complications: Secondary | ICD-10-CM

## 2017-12-07 DIAGNOSIS — N3281 Overactive bladder: Secondary | ICD-10-CM

## 2017-12-07 DIAGNOSIS — J449 Chronic obstructive pulmonary disease, unspecified: Secondary | ICD-10-CM | POA: Diagnosis not present

## 2017-12-07 DIAGNOSIS — E039 Hypothyroidism, unspecified: Secondary | ICD-10-CM

## 2017-12-07 DIAGNOSIS — E559 Vitamin D deficiency, unspecified: Secondary | ICD-10-CM | POA: Diagnosis not present

## 2017-12-07 DIAGNOSIS — Z0000 Encounter for general adult medical examination without abnormal findings: Secondary | ICD-10-CM

## 2017-12-07 DIAGNOSIS — T380X5A Adverse effect of glucocorticoids and synthetic analogues, initial encounter: Secondary | ICD-10-CM

## 2017-12-07 DIAGNOSIS — M1990 Unspecified osteoarthritis, unspecified site: Secondary | ICD-10-CM | POA: Diagnosis not present

## 2017-12-07 LAB — LIPID PANEL
CHOLESTEROL: 163 mg/dL (ref 0–200)
HDL: 57.6 mg/dL (ref >39.00)
LDL Cholesterol: 82 mg/dL (ref 0–99)
NONHDL: 105.62
Total CHOL/HDL Ratio: 3
Triglycerides: 117 mg/dL (ref 0.0–149.0)
VLDL: 23.4 mg/dL (ref 0.0–40.0)

## 2017-12-07 LAB — COMPREHENSIVE METABOLIC PANEL
ALBUMIN: 4.1 g/dL (ref 3.5–5.2)
ALK PHOS: 71 U/L (ref 39–117)
ALT: 14 U/L (ref 0–35)
AST: 17 U/L (ref 0–37)
BILIRUBIN TOTAL: 0.4 mg/dL (ref 0.2–1.2)
BUN: 25 mg/dL — ABNORMAL HIGH (ref 6–23)
CALCIUM: 8.9 mg/dL (ref 8.4–10.5)
CO2: 25 mEq/L (ref 19–32)
CREATININE: 1.08 mg/dL (ref 0.40–1.20)
Chloride: 102 mEq/L (ref 96–112)
GFR: 51.19 mL/min — AB (ref >60.00)
Glucose, Bld: 209 mg/dL — ABNORMAL HIGH (ref 70–99)
Potassium: 4.3 mEq/L (ref 3.5–5.1)
Sodium: 135 mEq/L (ref 135–145)
TOTAL PROTEIN: 7.1 g/dL (ref 6.0–8.3)

## 2017-12-07 LAB — HEMOGLOBIN A1C: Hgb A1c MFr Bld: 8.1 % — ABNORMAL HIGH (ref 4.6–6.5)

## 2017-12-07 LAB — MICROALBUMIN / CREATININE URINE RATIO
Creatinine,U: 109.3 mg/dL
MICROALB/CREAT RATIO: 0.6 mg/g (ref 0.0–30.0)

## 2017-12-07 LAB — CBC
HCT: 42.7 % (ref 36.0–46.0)
HEMOGLOBIN: 14.3 g/dL (ref 12.0–15.0)
MCHC: 33.5 g/dL (ref 30.0–36.0)
MCV: 91.3 fl (ref 78.0–100.0)
PLATELETS: 312 10*3/uL (ref 150.0–400.0)
RBC: 4.68 Mil/uL (ref 3.87–5.11)
RDW: 14.1 % (ref 11.5–15.5)
WBC: 7.2 10*3/uL (ref 4.0–10.5)

## 2017-12-07 LAB — TSH: TSH: 1.2 u[IU]/mL (ref 0.35–4.50)

## 2017-12-07 LAB — VITAMIN D 25 HYDROXY (VIT D DEFICIENCY, FRACTURES): VITD: 26.11 ng/mL — ABNORMAL LOW (ref 30.00–100.00)

## 2017-12-07 LAB — T4, FREE: FREE T4: 1.05 ng/dL (ref 0.60–1.60)

## 2017-12-07 MED ORDER — NYSTATIN 100000 UNIT/GM EX POWD: Freq: Two times a day (BID) | CUTANEOUS | 0 refills | Status: DC

## 2017-12-07 MED ORDER — CLOBETASOL PROPIONATE 0.05 % EX CREA: 1.0000 "application " | TOPICAL_CREAM | Freq: Two times a day (BID) | CUTANEOUS | 2 refills | Status: DC

## 2017-12-07 NOTE — Assessment & Plan Note (Signed)
Controlled on Ditropan Will monitor

## 2017-12-07 NOTE — Patient Instructions (Signed)
Health Maintenance for Postmenopausal Women Menopause is a normal process in which your reproductive ability comes to an end. This process happens gradually over a span of months to years, usually between the ages of 22 and 9. Menopause is complete when you have missed 12 consecutive menstrual periods. It is important to talk with your health care provider about some of the most common conditions that affect postmenopausal women, such as heart disease, cancer, and bone loss (osteoporosis). Adopting a healthy lifestyle and getting preventive care can help to promote your health and wellness. Those actions can also lower your chances of developing some of these common conditions. What should I know about menopause? During menopause, you may experience a number of symptoms, such as:  Moderate-to-severe hot flashes.  Night sweats.  Decrease in sex drive.  Mood swings.  Headaches.  Tiredness.  Irritability.  Memory problems.  Insomnia.  Choosing to treat or not to treat menopausal changes is an individual decision that you make with your health care provider. What should I know about hormone replacement therapy and supplements? Hormone therapy products are effective for treating symptoms that are associated with menopause, such as hot flashes and night sweats. Hormone replacement carries certain risks, especially as you become older. If you are thinking about using estrogen or estrogen with progestin treatments, discuss the benefits and risks with your health care provider. What should I know about heart disease and stroke? Heart disease, heart attack, and stroke become more likely as you age. This may be due, in part, to the hormonal changes that your body experiences during menopause. These can affect how your body processes dietary fats, triglycerides, and cholesterol. Heart attack and stroke are both medical emergencies. There are many things that you can do to help prevent heart disease  and stroke:  Have your blood pressure checked at least every 1-2 years. High blood pressure causes heart disease and increases the risk of stroke.  If you are 53-22 years old, ask your health care provider if you should take aspirin to prevent a heart attack or a stroke.  Do not use any tobacco products, including cigarettes, chewing tobacco, or electronic cigarettes. If you need help quitting, ask your health care provider.  It is important to eat a healthy diet and maintain a healthy weight. ? Be sure to include plenty of vegetables, fruits, low-fat dairy products, and lean protein. ? Avoid eating foods that are high in solid fats, added sugars, or salt (sodium).  Get regular exercise. This is one of the most important things that you can do for your health. ? Try to exercise for at least 150 minutes each week. The type of exercise that you do should increase your heart rate and make you sweat. This is known as moderate-intensity exercise. ? Try to do strengthening exercises at least twice each week. Do these in addition to the moderate-intensity exercise.  Know your numbers.Ask your health care provider to check your cholesterol and your blood glucose. Continue to have your blood tested as directed by your health care provider.  What should I know about cancer screening? There are several types of cancer. Take the following steps to reduce your risk and to catch any cancer development as early as possible. Breast Cancer  Practice breast self-awareness. ? This means understanding how your breasts normally appear and feel. ? It also means doing regular breast self-exams. Let your health care provider know about any changes, no matter how small.  If you are 40  or older, have a clinician do a breast exam (clinical breast exam or CBE) every year. Depending on your age, family history, and medical history, it may be recommended that you also have a yearly breast X-ray (mammogram).  If you  have a family history of breast cancer, talk with your health care provider about genetic screening.  If you are at high risk for breast cancer, talk with your health care provider about having an MRI and a mammogram every year.  Breast cancer (BRCA) gene test is recommended for women who have family members with BRCA-related cancers. Results of the assessment will determine the need for genetic counseling and BRCA1 and for BRCA2 testing. BRCA-related cancers include these types: ? Breast. This occurs in males or females. ? Ovarian. ? Tubal. This may also be called fallopian tube cancer. ? Cancer of the abdominal or pelvic lining (peritoneal cancer). ? Prostate. ? Pancreatic.  Cervical, Uterine, and Ovarian Cancer Your health care provider may recommend that you be screened regularly for cancer of the pelvic organs. These include your ovaries, uterus, and vagina. This screening involves a pelvic exam, which includes checking for microscopic changes to the surface of your cervix (Pap test).  For women ages 21-65, health care providers may recommend a pelvic exam and a Pap test every three years. For women ages 82-65, they may recommend the Pap test and pelvic exam, combined with testing for human papilloma virus (HPV), every five years. Some types of HPV increase your risk of cervical cancer. Testing for HPV may also be done on women of any age who have unclear Pap test results.  Other health care providers may not recommend any screening for nonpregnant women who are considered low risk for pelvic cancer and have no symptoms. Ask your health care provider if a screening pelvic exam is right for you.  If you have had past treatment for cervical cancer or a condition that could lead to cancer, you need Pap tests and screening for cancer for at least 20 years after your treatment. If Pap tests have been discontinued for you, your risk factors (such as having a new sexual partner) need to be  reassessed to determine if you should start having screenings again. Some women have medical problems that increase the chance of getting cervical cancer. In these cases, your health care provider may recommend that you have screening and Pap tests more often.  If you have a family history of uterine cancer or ovarian cancer, talk with your health care provider about genetic screening.  If you have vaginal bleeding after reaching menopause, tell your health care provider.  There are currently no reliable tests available to screen for ovarian cancer.  Lung Cancer Lung cancer screening is recommended for adults 91-70 years old who are at high risk for lung cancer because of a history of smoking. A yearly low-dose CT scan of the lungs is recommended if you:  Currently smoke.  Have a history of at least 30 pack-years of smoking and you currently smoke or have quit within the past 15 years. A pack-year is smoking an average of one pack of cigarettes per day for one year.  Yearly screening should:  Continue until it has been 15 years since you quit.  Stop if you develop a health problem that would prevent you from having lung cancer treatment.  Colorectal Cancer  This type of cancer can be detected and can often be prevented.  Routine colorectal cancer screening usually begins at  age 42 and continues through age 45.  If you have risk factors for colon cancer, your health care provider may recommend that you be screened at an earlier age.  If you have a family history of colorectal cancer, talk with your health care provider about genetic screening.  Your health care provider may also recommend using home test kits to check for hidden blood in your stool.  A small camera at the end of a tube can be used to examine your colon directly (sigmoidoscopy or colonoscopy). This is done to check for the earliest forms of colorectal cancer.  Direct examination of the colon should be repeated every  5-10 years until age 71. However, if early forms of precancerous polyps or small growths are found or if you have a family history or genetic risk for colorectal cancer, you may need to be screened more often.  Skin Cancer  Check your skin from head to toe regularly.  Monitor any moles. Be sure to tell your health care provider: ? About any new moles or changes in moles, especially if there is a change in a mole's shape or color. ? If you have a mole that is larger than the size of a pencil eraser.  If any of your family members has a history of skin cancer, especially at a young age, talk with your health care provider about genetic screening.  Always use sunscreen. Apply sunscreen liberally and repeatedly throughout the day.  Whenever you are outside, protect yourself by wearing long sleeves, pants, a wide-brimmed hat, and sunglasses.  What should I know about osteoporosis? Osteoporosis is a condition in which bone destruction happens more quickly than new bone creation. After menopause, you may be at an increased risk for osteoporosis. To help prevent osteoporosis or the bone fractures that can happen because of osteoporosis, the following is recommended:  If you are 46-71 years old, get at least 1,000 mg of calcium and at least 600 mg of vitamin D per day.  If you are older than age 55 but younger than age 65, get at least 1,200 mg of calcium and at least 600 mg of vitamin D per day.  If you are older than age 54, get at least 1,200 mg of calcium and at least 800 mg of vitamin D per day.  Smoking and excessive alcohol intake increase the risk of osteoporosis. Eat foods that are rich in calcium and vitamin D, and do weight-bearing exercises several times each week as directed by your health care provider. What should I know about how menopause affects my mental health? Depression may occur at any age, but it is more common as you become older. Common symptoms of depression  include:  Low or sad mood.  Changes in sleep patterns.  Changes in appetite or eating patterns.  Feeling an overall lack of motivation or enjoyment of activities that you previously enjoyed.  Frequent crying spells.  Talk with your health care provider if you think that you are experiencing depression. What should I know about immunizations? It is important that you get and maintain your immunizations. These include:  Tetanus, diphtheria, and pertussis (Tdap) booster vaccine.  Influenza every year before the flu season begins.  Pneumonia vaccine.  Shingles vaccine.  Your health care provider may also recommend other immunizations. This information is not intended to replace advice given to you by your health care provider. Make sure you discuss any questions you have with your health care provider. Document Released: 12/23/2005  Document Revised: 05/20/2016 Document Reviewed: 08/04/2015 Elsevier Interactive Patient Education  2018 [employer]vier Inc[employer]  

## 2017-12-07 NOTE — Assessment & Plan Note (Signed)
Continue Meloxicam CMET today 

## 2017-12-07 NOTE — Progress Notes (Signed)
HPI:  Pt presents to the clinic today for her Medicare Wellness Exam. She is also due to follow up chronic conditions.  Arthritis: Mainly in her back and knees. She takes Meloxicam daily with some relief.  Asthma/COPD: Debilitating. She is taking Spiriva and Prednisone as prescribed. She has an Albuterol inhaler and nebs to take when needed. She follows with Dr. Alva Garnet.   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 She is taking Metformin and Glipizide as prescribed. She does not monitor her sugars .She checks her feet daily.   Hypothyroidism: She reports chronic fatigue. She is taking her Levothyroxine as prescribed. Her thyroid levels were last checked 08/2016.  OAB: Controlled on Ditropan. She does not follow with urology.  Past Medical History:  Diagnosis Date  . Arthritis   . Asthma   . COPD (chronic obstructive pulmonary disease) ([place])   . Diabetes ([place])   . Thyroid disease     Current Outpatient Medications  Medication Sig Dispense Refill  . albuterol (PROVENTIL) (2.5 MG/3ML) 0.083% nebulizer solution Take 3 mLs (2.5 mg total) by nebulization every 4 (four) hours as needed for wheezing or shortness of breath. 150 mL 5  . amoxicillin-clavulanate (AUGMENTIN) 875-125 MG tablet Take 1 tablet by mouth 2 (two) times daily. 14 tablet 0  . Cholecalciferol (VITAMIN D PO) Take 1 tablet by mouth daily.    . clobetasol cream (TEMOVATE) 7.89 % Apply 1 application topically 2 (two) times daily. 30 g 2  . glipiZIDE (GLUCOTROL XL) 5 MG 24 hr tablet Take 1 tablet (5 mg total) by mouth daily with breakfast. 90 tablet 1  . levocetirizine (XYZAL) 5 MG tablet take 1 tablet by mouth once daily if needed 30 tablet 3  . levothyroxine (SYNTHROID, LEVOTHROID) 75 MCG tablet take 1 tablet by mouth once daily BEFORE BREAKFAST 90 tablet 1  . meloxicam (MOBIC) 15 MG tablet Take 1 tablet (15 mg total) by mouth every other day. 15 tablet 1  . metFORMIN (GLUCOPHAGE) 500 MG tablet Take 1 tablet  (500 mg total) by mouth 2 (two) times daily with a meal. 180 tablet 0  . Multiple Vitamin (MULTIVITAMIN) capsule Take 1 capsule by mouth daily.    Marland Kitchen nystatin (MYCOSTATIN/NYSTOP) powder Apply topically 2 (two) times daily. 30 g 0  . oxybutynin (DITROPAN-XL) 5 MG 24 hr tablet Take 1 tablet (5 mg total) by mouth at bedtime. 30 tablet 2  . tiotropium (SPIRIVA HANDIHALER) 18 MCG inhalation capsule Place 1 capsule (18 mcg total) into inhaler and inhale daily. 30 capsule 5  . VENTOLIN HFA 108 (90 Base) MCG/ACT inhaler inhale 1 to 2 puffs by mouth every 6 hours pain for wheezing or shortness of breath 18 g 0   No current facility-administered medications for this visit.     No Known Allergies  Family History  Problem Relation Age of Onset  . Arthritis Father   . Diabetes Sister   . Diabetes Daughter   . Cancer Neg Hx   . Heart disease Neg Hx   . Stroke Neg Hx     Social History   Socioeconomic History  . Marital status: Widowed    Spouse name: Not on file  . Number of children: Not on file  . Years of education: Not on file  . Highest education level: Not on file  Social Needs  . Financial resource strain: Not on file  . Food insecurity - worry: Not on file  . Food insecurity - inability: Not on file  .  Transportation needs - medical: Not on file  . Transportation needs - non-medical: Not on file  Occupational History  . Occupation: Retired  Tobacco Use  . Smoking status: Former Smoker    Packs/day: 1.50    Years: 30.00    Pack years: 45.00    Types: Cigarettes    Last attempt to quit: 11/15/1987    Years since quitting: 30.0  . Smokeless tobacco: Never Used  Substance and Sexual Activity  . Alcohol use: No    Alcohol/week: 0.0 oz  . Drug use: No  . Sexual activity: Not on file  Other Topics Concern  . Not on file  Social History Narrative  . Not on file    Hospitiliaztions: None  Health Maintenance:    Flu: 08/2017  Tetanus: unsure  Pneumovax: unsure  Prevnar:  08/2015  Zostavax: never  Shingrix: never  Mammogram: > 5 years ago  Pap Smear: no longer screening  Bone Density: no longer screening  Colon Screening: 2013  Eye Doctor: as needed  Dental Exam: biannually   Providers:   PCP: Webb Silversmith, NP-C  Pulmonologist: Dr. Alva Garnet   I have personally reviewed and have noted:  1. The patient's medical and social history 2. Their use of alcohol, tobacco or illicit drugs 3. Their current medications and supplements 4. The patient's functional ability including ADL's, fall risks, home safety risks and hearing or visual impairment. 5. Diet and physical activities 6. Evidence for depression or mood disorder  Subjective:   Review of Systems:   Constitutional: Pt reports fatigue. Denies fever, malaise, headache or abrupt weight changes.  HEENT: Denies eye pain, eye redness, ear pain, ringing in the ears, wax buildup, runny nose, nasal congestion, bloody nose, or sore throat. Respiratory: Pt reports chronic cough and SOB.Denies difficulty breathing, or sputum production.   Cardiovascular: Pt reports swelling in her legs. Denies chest pain, chest tightness, palpitations or swelling in the hands Gastrointestinal: Denies abdominal pain, bloating, constipation, diarrhea or blood in the stool.  GU: Pt reports urge incontinence. Denies urgency, frequency, pain with urination, burning sensation, blood in urine, odor or discharge. Musculoskeletal: Pt reports intermittent joint pain. decrease in range of motion, difficulty with gait, muscle pain or joint swelling.  Skin: Denies redness, rashes, lesions or ulcercations.  Neurological: Denies dizziness, difficulty with memory, difficulty with speech or problems with balance and coordination.  Psych: Denies anxiety, depression, SI/HI.  No other specific complaints in a complete review of systems (except as listed in HPI above).  Objective:  PE:   BP 122/84   Pulse 76   Temp 97.9 F (36.6 C) (Oral)    Ht 5\' 4"  (1.626 m)   Wt 188 lb (85.3 kg)   SpO2 97%   BMI 32.27 kg/m   Wt Readings from Last 3 Encounters:  11/16/17 190 lb (86.2 kg)  09/20/17 192 lb (87.1 kg)  08/07/17 194 lb 8 oz (88.2 kg)    General: Appears her stated age, chronically ill appearing, in NAD. HEENT: Head: normal shape and size; Eyes: sclera white, no icterus, conjunctiva pink, PERRLA and EOMs intact; Throat/Mouth: Teeth present, mucosa pink and moist, no exudate, lesions or ulcerations noted.  Neck: Neck supple, trachea midline. No masses, lumps present.  Cardiovascular: Normal rate and rhythm. S1,S2 noted.  No murmur, rubs or gallops noted. Trace BLE edema. No carotid bruits noted. Pulmonary/Chest: Normal effort and positive vesicular breath sounds. No respiratory distress. No wheezes, rales or ronchi noted.  Musculoskeletal: No difficulty with gait.  Neurological: Alert and oriented. Cranial nerves II-XII grossly intact. Coordination normal.  Psychiatric: Mood and affect normal. Behavior is normal. Judgment and thought content normal.    BMET    Component Value Date/Time   NA 137 09/01/2016 1400   K 4.3 09/01/2016 1400   CL 101 09/01/2016 1400   CO2 27 09/01/2016 1400   GLUCOSE 199 (H) 09/01/2016 1400   BUN 27 (H) 09/01/2016 1400   CREATININE 0.85 09/01/2016 1400   CALCIUM 9.3 09/01/2016 1400    Lipid Panel     Component Value Date/Time   CHOL 175 09/01/2016 1400   TRIG 181.0 (H) 09/01/2016 1400   HDL 48.10 09/01/2016 1400   CHOLHDL 4 09/01/2016 1400   VLDL 36.2 09/01/2016 1400   LDLCALC 91 09/01/2016 1400    CBC    Component Value Date/Time   WBC 7.1 09/01/2016 1400   RBC 4.47 09/01/2016 1400   HGB 13.8 09/01/2016 1400   HCT 40.9 09/01/2016 1400   PLT 312.0 09/01/2016 1400   MCV 91.6 09/01/2016 1400   MCHC 33.8 09/01/2016 1400   RDW 13.6 09/01/2016 1400    Hgb A1C Lab Results  Component Value Date   HGBA1C 8.4 (H) 08/14/2017      Assessment and Plan:   Medicare Annual  Wellness Visit:  Diet: She does eat meat. She consumes fruits and veggies daily. She rarely eats fried foods. She drinks mostly water, coffee Physical activity: None Depression/mood screen: Negative Hearing: Intact to whispered voice Visual acuity: Grossly normal ADLs: Capable Fall risk: None Home safety: Good Cognitive evaluation: Intact to orientation, naming, recall and repetition EOL planning: Adv directives, full code/ I agree  Preventative Medicine: Flu and prevnar UTD. She is sure her pneumovax is UTD. She declines tetanus, zostovax, shingrix, mammogram, pap smear, bone density or colon screening. Encouraged her to consume a balanced diet and exercise regimen. Advised her to see an eye doctor and dentist annually. Will check CBC, CMET, TSH, Free T4, Lipid, A1C, microalbumin and Vit D today.   Next appointment: 3 months, follow up DM 2   BAITY, [place], NP

## 2017-12-07 NOTE — Assessment & Plan Note (Signed)
Breathing well today Continue Spiriva She reports she is on Prednisone but I do not see this on her list She will continue to follow with pulmonology

## 2017-12-07 NOTE — Assessment & Plan Note (Signed)
Will check TSH and Free T4 today Continue Synthroid for now, will adjust if needed based on labs

## 2017-12-07 NOTE — Assessment & Plan Note (Signed)
A1C and microalbumin today Foot exam today Flu and prevnar UTD Continue Metformin and Glipizide Encouraged yearly eye exams

## 2017-12-14 ENCOUNTER — Telehealth: Payer: Self-pay | Admitting: Internal Medicine

## 2017-12-14 NOTE — Telephone Encounter (Signed)
This is already being addressed, please see result note

## 2017-12-14 NOTE — Telephone Encounter (Signed)
Copied from [place]. Topic: Quick Communication - Rx Refill/Question >> Dec 14, 2017 12:03 PM Bea Graff, NT wrote: Medication: glipiZIDE (GLUCOTROL XL and Prednisone   Has the patient contacted their pharmacy? No.   (Agent: If no, request that the patient contact the pharmacy for the refill.)   Preferred Pharmacy (with phone number or street name): [facility] on [address]. Pt did not call pharmacy, states bottles say no refills.    Agent: Please be advised that RX refills may take up to 3 business days. We ask that you follow-up with your pharmacy.

## 2017-12-14 NOTE — Telephone Encounter (Signed)
Glipizide and prednisone   Refill  Glipizide has expired and no order for prednisone found  Last OV:  12/07/17 Last Refill: Pharmacy: [facility] [phone],  [place]  [phone]

## 2017-12-15 ENCOUNTER — Other Ambulatory Visit: Payer: Self-pay

## 2017-12-15 MED ORDER — GLIPIZIDE ER 10 MG PO TB24: 10.0000 mg | ORAL_TABLET | Freq: Every day | ORAL | 1 refills | Status: DC

## 2017-12-15 MED ORDER — PREDNISONE 5 MG PO TABS: 5.0000 mg | ORAL_TABLET | ORAL | 5 refills | Status: DC

## 2017-12-15 NOTE — Telephone Encounter (Signed)
This Rx was d/c at her last OV with you as pt was put on 20mg  tab for acute treatment... Please advise if okay for pt to restart

## 2017-12-15 NOTE — Addendum Note (Signed)
Addended by: Lurlean Nanny on: 12/15/2017 10:19 AM   Modules accepted: Orders

## 2017-12-15 NOTE — Telephone Encounter (Signed)
Lattie Haw from [facility] [address] said pt is there to pick up glipizide and prednisone. Threasa Beards said she spoke with pt 12/14/17 and would talk with Avie Echevaria NP and call pt back about the prednisone. Threasa Beards will send glipizide to [facility] [address] now but will call pt back later today after speaking with Avie Echevaria NP. Pt said Rollene Fare has filled prednisone before and does not understand why she would have to call pulmonologist.  Pt voiced understanding and will wait for cb. Threasa Beards has already fwd refill request to pulmonologist.Melanie states does not need note sent to her.

## 2017-12-17 ENCOUNTER — Other Ambulatory Visit: Payer: Self-pay | Admitting: Internal Medicine

## 2017-12-29 ENCOUNTER — Telehealth: Payer: Self-pay | Admitting: Internal Medicine

## 2017-12-29 ENCOUNTER — Other Ambulatory Visit: Payer: Self-pay | Admitting: *Deleted

## 2017-12-29 MED ORDER — LEVOTHYROXINE SODIUM 75 MCG PO TABS: ORAL_TABLET | ORAL | 1 refills | Status: DC

## 2017-12-29 NOTE — Telephone Encounter (Signed)
Refilled  synthroid  Per  protocall

## 2017-12-29 NOTE — Telephone Encounter (Signed)
Copied from [place] [id number]. Topic: General - Other >> Dec 29, 2017 11:07 AM Darl Householder, RMA wrote: Reason for CRM: Medication refill request for levothyroxine (SYNTHROID, LEVOTHROID) 75 MCG to be sent to [place]

## 2018-01-04 ENCOUNTER — Emergency Department: Payer: Medicare Other

## 2018-01-04 ENCOUNTER — Encounter: Payer: Self-pay | Admitting: Emergency Medicine

## 2018-01-04 ENCOUNTER — Ambulatory Visit: Payer: Self-pay

## 2018-01-04 ENCOUNTER — Other Ambulatory Visit: Payer: Self-pay

## 2018-01-04 ENCOUNTER — Emergency Department
Admission: EM | Admit: 2018-01-04 | Discharge: 2018-01-04 | Disposition: A | Discharge status: Home / Self Care | Payer: Medicare Other | Attending: Emergency Medicine | Admitting: Emergency Medicine

## 2018-01-04 DIAGNOSIS — Z87891 Personal history of nicotine dependence: Secondary | ICD-10-CM | POA: Insufficient documentation

## 2018-01-04 DIAGNOSIS — J45909 Unspecified asthma, uncomplicated: Secondary | ICD-10-CM | POA: Insufficient documentation

## 2018-01-04 DIAGNOSIS — Z7984 Long term (current) use of oral hypoglycemic drugs: Secondary | ICD-10-CM | POA: Insufficient documentation

## 2018-01-04 DIAGNOSIS — M7918 Myalgia, other site: Secondary | ICD-10-CM | POA: Diagnosis not present

## 2018-01-04 DIAGNOSIS — M62838 Other muscle spasm: Secondary | ICD-10-CM | POA: Diagnosis not present

## 2018-01-04 DIAGNOSIS — E099 Drug or chemical induced diabetes mellitus without complications: Secondary | ICD-10-CM | POA: Insufficient documentation

## 2018-01-04 DIAGNOSIS — E039 Hypothyroidism, unspecified: Secondary | ICD-10-CM | POA: Insufficient documentation

## 2018-01-04 DIAGNOSIS — J449 Chronic obstructive pulmonary disease, unspecified: Secondary | ICD-10-CM | POA: Diagnosis not present

## 2018-01-04 DIAGNOSIS — R0602 Shortness of breath: Secondary | ICD-10-CM | POA: Diagnosis not present

## 2018-01-04 DIAGNOSIS — Z79899 Other long term (current) drug therapy: Secondary | ICD-10-CM | POA: Diagnosis not present

## 2018-01-04 DIAGNOSIS — Z96651 Presence of right artificial knee joint: Secondary | ICD-10-CM | POA: Insufficient documentation

## 2018-01-04 DIAGNOSIS — R079 Chest pain, unspecified: Secondary | ICD-10-CM | POA: Diagnosis not present

## 2018-01-04 DIAGNOSIS — M25512 Pain in left shoulder: Secondary | ICD-10-CM | POA: Diagnosis not present

## 2018-01-04 DIAGNOSIS — R0789 Other chest pain: Secondary | ICD-10-CM | POA: Diagnosis not present

## 2018-01-04 LAB — CBC
HCT: 42.7 % (ref 35.0–47.0)
Hemoglobin: 14.3 g/dL (ref 12.0–16.0)
MCH: 30.5 pg (ref 26.0–34.0)
MCHC: 33.4 g/dL (ref 32.0–36.0)
MCV: 91.3 fL (ref 80.0–100.0)
PLATELETS: 284 10*3/uL (ref 150–440)
RBC: 4.68 MIL/uL (ref 3.80–5.20)
RDW: 14.1 % (ref 11.5–14.5)
WBC: 7.5 10*3/uL (ref 3.6–11.0)

## 2018-01-04 LAB — BASIC METABOLIC PANEL
Anion gap: 9 (ref 5–15)
BUN: 19 mg/dL (ref 6–20)
CO2: 23 mmol/L (ref 22–32)
CREATININE: 0.97 mg/dL (ref 0.44–1.00)
Calcium: 9.1 mg/dL (ref 8.9–10.3)
Chloride: 101 mmol/L (ref 101–111)
GFR calc Af Amer: >60 mL/min (ref >60)
GFR, EST NON AFRICAN AMERICAN: 52 mL/min — AB (ref >60)
Glucose, Bld: 371 mg/dL — ABNORMAL HIGH (ref 65–99)
Potassium: 5.2 mmol/L — ABNORMAL HIGH (ref 3.5–5.1)
SODIUM: 133 mmol/L — AB (ref 135–145)

## 2018-01-04 LAB — TROPONIN I: Troponin I: <0.03 ng/mL (ref <0.03)

## 2018-01-04 MED ORDER — CYCLOBENZAPRINE HCL 10 MG PO TABS: 10.0000 mg | ORAL_TABLET | Freq: Three times a day (TID) | ORAL | 0 refills | Status: DC | PRN

## 2018-01-04 NOTE — ED Provider Notes (Signed)
[facility] Emergency Department Provider Note ____________________________________________   I have reviewed the triage vital signs and the triage nursing note.  HISTORY  Chief Complaint Chest Pain and Shortness of Breath   Historian Patient  HPI Megan Bradshaw is a 84 y.o. female with a history of COPD, no known history of coronary disease, presents for several weeks of left lateral chest wall discomfort left-sided top of the shoulder pain which occasionally goes down the left arm, and this woke her up this morning around 2 AM and it was more severe.  When she called her primary doctor's office this morning she was told by phone that given her age she should come to the ED for further evaluation especially with regard to the heart.  She has COPD and chronic trouble breathing, she is on 10 mg prednisone daily.  Does not report any worsening trouble breathing.  No pleuritic chest pain.  Reports no traumatic injury or overuse injury.  No palpitations.  No nausea or sweats.  No abdominal pain.  Pain is at times moderate to severe, currently mild to moderate.  Worse with movement of the upper extremity and palpation to the side of chest wall.   Past Medical History:  Diagnosis Date  . Arthritis   . Asthma   . COPD (chronic obstructive pulmonary disease) ([place])   . Diabetes ([place])   . Thyroid disease     Patient Active Problem List   Diagnosis Date Noted  . Arthritis 12/07/2017  . OAB (overactive bladder) 09/01/2016  . Seasonal allergies 09/01/2016  . 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 Hypothyroid 09/22/2014  . COPD GOLD I 08/12/2014    Past Surgical History:  Procedure Laterality Date  . ABDOMINAL HYSTERECTOMY  1984  . APPENDECTOMY  1939   . CATARACT EXTRACTION, BILATERAL    . CHOLECYSTECTOMY  1987  . NASAL SINUS SURGERY  1990  . REPLACEMENT TOTAL KNEE Right 2007    Prior to Admission  medications   Medication Sig Start Date End Date Taking? Authorizing Provider  albuterol (PROVENTIL) (2.5 MG/3ML) 0.083% nebulizer solution Take 3 mLs (2.5 mg total) by nebulization every 4 (four) hours as needed for wheezing or shortness of breath. 05/15/17  Yes Wilhelmina Mcardle, MD  Cholecalciferol (VITAMIN D PO) Take 1 tablet by mouth daily.   Yes [provider]  clobetasol cream (TEMOVATE) 2.63 % Apply 1 application topically 2 (two) times daily. 12/07/17  Yes Jearld Fenton, NP  glipiZIDE (GLIPIZIDE XL) 10 MG 24 hr tablet Take 1 tablet (10 mg total) by mouth daily with breakfast. 12/15/17  Yes Baity, Coralie Keens, NP  levocetirizine (XYZAL) 5 MG tablet take 1 tablet by mouth once daily if needed 09/12/17  Yes Venia Carbon, MD  levothyroxine (SYNTHROID, LEVOTHROID) 75 MCG tablet take 1 tablet by mouth once daily BEFORE BREAKFAST 12/29/17  Yes Jearld Fenton, NP  metFORMIN (GLUCOPHAGE) 500 MG tablet Take 1 tablet (500 mg total) by mouth 2 (two) times daily with a meal. 08/16/17  Yes Baity, Coralie Keens, NP  Multiple Vitamin (MULTIVITAMIN) capsule Take 1 capsule by mouth daily.   Yes [provider]  nystatin (MYCOSTATIN/NYSTOP) powder Apply topically 2 (two) times daily. 12/07/17  Yes Baity, Coralie Keens, NP  cyclobenzaprine (FLEXERIL) 10 MG tablet Take 1 tablet (10 mg total) by mouth 3 (three) times daily as needed for muscle spasms. 01/04/18   Lisa Roca, MD  meloxicam (MOBIC) 15 MG tablet Take 1  tablet (15 mg total) by mouth every other day. 10/25/16   Jearld Fenton, NP  oxybutynin (DITROPAN-XL) 5 MG 24 hr tablet Take 1 tablet (5 mg total) by mouth at bedtime. 08/20/15   Jearld Fenton, NP  predniSONE (DELTASONE) 5 MG tablet Take 1 tablet (5 mg total) by mouth every other day. Patient not taking: Reported on 01/04/2018 12/15/17   Wilhelmina Mcardle, MD  tiotropium (SPIRIVA HANDIHALER) 18 MCG inhalation capsule Place 1 capsule (18 mcg total) into inhaler and inhale daily. 06/29/17   Wilhelmina Mcardle, MD  VENTOLIN HFA 108 [phone] Base) MCG/ACT inhaler inhale 1 to 2 puffs by mouth every 6 hours pain for wheezing or shortness of breath 08/07/17   Jearld Fenton, NP    No Known Allergies  Family History  Problem Relation Age of Onset  . Arthritis Father   . Diabetes Sister   . Diabetes Daughter   . Cancer Neg Hx   . Heart disease Neg Hx   . Stroke Neg Hx     Social History Social History   Tobacco Use  . Smoking status: Former Smoker    Packs/day: 1.50    Years: 30.00    Pack years: 45.00    Types: Cigarettes    Last attempt to quit: 11/15/1987    Years since quitting: 30.1  . Smokeless tobacco: Never Used  Substance Use Topics  . Alcohol use: No    Alcohol/week: 0.0 oz  . Drug use: No    Review of Systems  Constitutional: Negative for fever. Eyes: Negative for visual changes. ENT: Negative for sore throat. Cardiovascular: Positive as per HPI for chest pain. Respiratory: Positive for chronic shortness of breath, no acute change. Gastrointestinal: Negative for abdominal pain, vomiting and diarrhea. Genitourinary: Negative for dysuria. Musculoskeletal: Negative for back pain. Skin: Negative for rash. Neurological: Negative for headache.  ____________________________________________   PHYSICAL EXAM:  VITAL SIGNS: ED Triage Vitals  Enc Vitals Group     BP 01/04/18 0917 130/88     Pulse Rate 01/04/18 0917 87     Resp 01/04/18 0917 20     Temp 01/04/18 0917 98.5 F (36.9 C)     Temp src --      SpO2 01/04/18 0917 97 %     Weight 01/04/18 0918 180 lb (81.6 kg)     Height --      Head Circumference --      Peak Flow --      Pain Score 01/04/18 0934 4     Pain Loc --      Pain Edu? --      Excl. in [place]? --      Constitutional: Alert and oriented. Well appearing and in no distress. HEENT   Head: Normocephalic and atraumatic.      Eyes: Conjunctivae are normal. Pupils equal and round.       Ears:         Nose: No congestion/rhinnorhea.    Mouth/Throat: Mucous membranes are moist.   Neck: No stridor.  Tender probable left trapezius muscle spasm at the base of the neck over the top of the shoulder. Cardiovascular/Chest: Somewhat tender to the side of the on the left chest wall.  Normal rate, regular rhythm.  No murmurs, rubs, or gallops. Respiratory: Normal respiratory effort without tachypnea nor retractions. Breath sounds are clear and equal bilaterally. No wheezes/rales/rhonchi. Gastrointestinal: Soft. No distention, no guarding, no rebound. Nontender.  Obese Genitourinary/rectal:Deferred Musculoskeletal: Nontender with normal  range of motion in all extremities. No joint effusions.  No lower extremity tenderness.  1+ lower extremity pitting edema bilaterally. Neurologic:  Normal speech and language. No gross or focal neurologic deficits are appreciated. Skin:  Skin is warm, dry and intact. No rash noted. Psychiatric: Mood and affect are normal. Speech and behavior are normal. Patient exhibits appropriate insight and judgment.   ____________________________________________  LABS (pertinent positives/negatives) I, Lisa Roca, MD the attending physician have reviewed the labs noted below.  Labs Reviewed  BASIC METABOLIC PANEL - Abnormal; Notable for the following components:      Result Value   Sodium 133 (*)    Potassium 5.2 (*)    Glucose, Bld 371 (*)    GFR calc non Af Amer 52 (*)    All other components within normal limits  CBC  TROPONIN I    ____________________________________________    EKG I, Lisa Roca, MD, the attending physician have personally viewed and interpreted all ECGs.  84 bpm.  Normal sinus rhythm.  Narrow QRS.  Right axis deviation.  Nonspecific ST and T wave ____________________________________________  RADIOLOGY  Chest x-ray reviewed by me: No focal infiltrate or pneumothorax or edema  Radiologist report:IMPRESSION: COPD without evidence of pneumonia or  edema. __________________________________________  PROCEDURES  Procedure(s) performed: None  Critical Care performed: None   ____________________________________________  ED COURSE / ASSESSMENT AND PLAN  Pertinent labs & imaging results that were available during my care of the patient were reviewed by me and considered in my medical decision making (see chart for details).   Patient is overall well-appearing.  No hypoxia.  No acute respiratory complaints.  She is indicating pain is to the lateral chest wall and then also across the top of the shoulder where she has a very tender palpable muscle spasm of the trapezius.  Symptoms sound very musculoskeletal.  Symptoms ongoing since 2 AM, over 7 hours, and initial EKG and troponin are reassuring.  I do not think she needs repeat troponin testing.  Given her age and risk factors, have recommended follow with cardiology, however my most likely cause for her symptoms today are actually musculoskeletal with the trapezius muscle spasm.  We discussed conservative management with ibuprofen and Flexeril, heat, massage/physical therapy which she can follow-up with her primary care doctor.  DIFFERENTIAL DIAGNOSIS: Differential diagnosis includes, but is not limited to, ACS, aortic dissection, pulmonary embolism, cardiac tamponade, pneumothorax, pneumonia, pericarditis, myocarditis, GI-related causes including esophagitis/gastritis, and musculoskeletal chest wall pain.     CONSULTATIONS:  None   Patient / Family / Caregiver informed of clinical course, medical decision-making process, and agree with plan.   I discussed return precautions, follow-up instructions, and discharge instructions with patient and/or family.  Discharge Instructions : You are evaluated for left-sided chest discomfort and left arm discomfort, and as we discussed your exam and evaluation are overall reassuring in the emergency department today.  No sign of heart  attack today, however I would recommend he follow-up with a cardiologist to consider whether or not they would go further with anything else for workup such as stress test.  You do have a muscle spasm of the trapezius muscles which I suspect is causing her symptoms.  Please discuss with your primary care doctor about whether or not a course of physical therapy may help relieve that area.  You may take over-the-counter ibuprofen for anti-inflammation.  You are being prescribed muscle relaxer Flexeril to help with symptoms.  You may use a heating pad to that  area.  Return to the emergency room immediately for any worsening or uncontrolled pain, weakness, numbness, nausea, sweats, dizziness or passing out, trouble breathing, or any other symptoms concerning to you.    ___________________________________________   FINAL CLINICAL IMPRESSION(S) / ED DIAGNOSES   Final diagnoses:  Acute pain of left shoulder  Chest pain, unspecified type  Trapezius muscle spasm  Musculoskeletal pain      ___________________________________________        Note: This dictation was prepared with Dragon dictation. Any transcriptional errors that result from this process are unintentional    Lisa Roca, MD 01/04/18 1055

## 2018-01-04 NOTE — ED Notes (Signed)
Patient transported to X-ray 

## 2018-01-04 NOTE — Telephone Encounter (Signed)
Pt. C/o 3 days of chest pain with shoulder,neck and back pain. C/o shortness of breath - has COPD. Reports arm are hurting as well. Instructed to go to ED now. States granddaughter will take her.  Reason for Disposition . Pain also present in shoulder(s) or arm(s) or jaw  (Exception: pain is clearly made worse by movement)  Answer Assessment - Initial Assessment Questions 1. LOCATION: "Where does it hurt?"       Left side of chest 2. RADIATION: "Does the pain go anywhere else?" (e.g., into neck, jaw, arms, back)     I have pain in my neck and back 3. ONSET: "When did the chest pain begin?" (Minutes, hours or days)      Started a few days but worse today. 4. PATTERN "Does the pain come and go, or has it been constant since it started?"  "Does it get worse with exertion?"      Constant 5. DURATION: "How long does it last" (e.g., seconds, minutes, hours)     My back is hurting 6. SEVERITY: "How bad is the pain?"  (e.g., Scale 1-10; mild, moderate, or severe)    - MILD (1-3): doesn't interfere with normal activities     - MODERATE (4-7): interferes with normal activities or awakens from sleep    - SEVERE (8-10): excruciating pain, unable to do any normal activities       7 7. CARDIAC RISK FACTORS: "Do you have any history of heart problems or risk factors for heart disease?" (e.g., prior heart attack, angina; high blood pressure, diabetes, being overweight, high cholesterol, smoking, or strong family history of heart disease)     No 8. PULMONARY RISK FACTORS: "Do you have any history of lung disease?"  (e.g., blood clots in lung, asthma, emphysema, birth control pills)     COPD 9. CAUSE: "What do you think is causing the chest pain?"     Pt. Thinks it could be muscle 10. OTHER SYMPTOMS: "Do you have any other symptoms?" (e.g., dizziness, nausea, vomiting, sweating, fever, difficulty breathing, cough)       Shortness of breath - used inhaler 11. PREGNANCY: "Is there any chance you are  pregnant?" "When was your last menstrual period?"       No  Protocols used: CHEST PAIN-A-AH

## 2018-01-04 NOTE — Telephone Encounter (Signed)
Noted, agree with advice given 

## 2018-01-04 NOTE — Progress Notes (Signed)
Inpatient Diabetes Program Recommendations  AACE/ADA: New Consensus Statement on Inpatient Glycemic Control (2015)  Target Ranges:  Prepandial:   less than 140 mg/dL      Peak postprandial:   less than 180 mg/dL (1-2 hours)      Critically ill patients:  140 - 180 mg/dL   Lab Results  Component Value Date   HGBA1C 8.1 (H) 12/07/2017    Review of Glycemic Control- lab glucose Results for BAMBIE, PIZZOLATO (MRN [id number]) as of 01/04/2018 10:37  Ref. Range 01/04/2018 09:21  Glucose Latest Ref Range: 65 - 99 mg/dL 371 (H)     Diabetes history: Type 2 Outpatient Diabetes medications: Glipizide 10mg  qday, Glucophage 500mg  bid  Current orders for Inpatient glycemic control: none  Inpatient Diabetes Program Recommendations:  Consider ordering Novolog 0-9 units tid, Novolog 0-5 units qhs  Gentry Fitz, RN, [place], [place], CDE Diabetes Coordinator Inpatient Diabetes Program  [phone] (Team Pager) [phone] ([place]) 01/04/2018 10:41 AM

## 2018-01-04 NOTE — ED Triage Notes (Addendum)
Arrives with c/o chest pain, onset of symptoms this morning at 0200.  Patient states she has history of COPD.  Also c/o breathing difficulty.    Patient AAOx3.  Skin warm and dry.  Speaking in full sentences.  Patient is anxious.  NAD

## 2018-01-04 NOTE — Discharge Instructions (Signed)
You are evaluated for left-sided chest discomfort and left arm discomfort, and as we discussed your exam and evaluation are overall reassuring in the emergency department today.  No sign of heart attack today, however I would recommend he follow-up with a cardiologist to consider whether or not they would go further with anything else for workup such as stress test.  You do have a muscle spasm of the trapezius muscles which I suspect is causing her symptoms.  Please discuss with your primary care doctor about whether or not a course of physical therapy may help relieve that area.  You may take over-the-counter ibuprofen for anti-inflammation.  You are being prescribed muscle relaxer Flexeril to help with symptoms.  You may use a heating pad to that area.  Return to the emergency room immediately for any worsening or uncontrolled pain, weakness, numbness, nausea, sweats, dizziness or passing out, trouble breathing, or any other symptoms concerning to you.

## 2018-01-05 ENCOUNTER — Ambulatory Visit: Payer: Medicare Other | Admitting: Internal Medicine

## 2018-01-15 ENCOUNTER — Telehealth: Payer: Self-pay | Admitting: Internal Medicine

## 2018-01-15 NOTE — Telephone Encounter (Signed)
Copied from [place] [id number]. Topic: Quick Communication - Rx Refill/Question >> Jan 15, 2018  2:34 PM Ahmed Prima L wrote: Medication: VENTOLIN HFA 108 (90 Base) MCG/ACT inhaler   Has the patient contacted their pharmacy? Told her to call dr office    (Agent: If no, request that the patient contact the pharmacy for the refill.)   Preferred Pharmacy (with phone number or street name): [id number] - [place], [place] [address]   Agent: Please be advised that RX refills may take up to 3 business days. We ask that you follow-up with your pharmacy.

## 2018-01-16 MED ORDER — ALBUTEROL SULFATE HFA 108 (90 BASE) MCG/ACT IN AERS: INHALATION_SPRAY | RESPIRATORY_TRACT | 1 refills | Status: DC

## 2018-01-19 DIAGNOSIS — L601 Onycholysis: Secondary | ICD-10-CM | POA: Diagnosis not present

## 2018-01-19 DIAGNOSIS — L820 Inflamed seborrheic keratosis: Secondary | ICD-10-CM | POA: Diagnosis not present

## 2018-01-19 DIAGNOSIS — K130 Diseases of lips: Secondary | ICD-10-CM | POA: Diagnosis not present

## 2018-01-19 DIAGNOSIS — L821 Other seborrheic keratosis: Secondary | ICD-10-CM | POA: Diagnosis not present

## 2018-02-12 NOTE — Telephone Encounter (Signed)
See other phone note 10/30/17.

## 2018-03-01 ENCOUNTER — Ambulatory Visit (INDEPENDENT_AMBULATORY_CARE_PROVIDER_SITE_OTHER): Payer: Medicare Other | Admitting: Pulmonary Disease

## 2018-03-01 ENCOUNTER — Encounter: Payer: Self-pay | Admitting: Pulmonary Disease

## 2018-03-01 VITALS — BP 162/90 | HR 106 | Resp 16 | Ht 64.0 in | Wt 184.0 lb

## 2018-03-01 DIAGNOSIS — J449 Chronic obstructive pulmonary disease, unspecified: Secondary | ICD-10-CM | POA: Diagnosis not present

## 2018-03-01 DIAGNOSIS — R05 Cough: Secondary | ICD-10-CM

## 2018-03-01 DIAGNOSIS — R053 Chronic cough: Secondary | ICD-10-CM

## 2018-03-01 MED ORDER — ALPRAZOLAM 0.25 MG PO TABS: 0.2500 mg | ORAL_TABLET | Freq: Two times a day (BID) | ORAL | 0 refills | Status: DC | PRN

## 2018-03-01 NOTE — Progress Notes (Signed)
Pt profile: 84 y.o. F moved from [place]. Initially seen by Dr Melvyn Novas. Diagnosed with COPD in 2011. Graded as Gold I by Dr Melvyn Novas. Chronic prednisone therapy  PROBLEMS: Gold I COPD -chronic asthmatic bronchitis Chronic refractory cough  DATA: 10/22/14 PFTs: mild obstruction, FEV1 1.36 L (81%), FEV1/FVC 53%, TLC normal, DLCO 51% pred 02/16/16 PFTs: FVC: 2.23 L (90 %pred), FEV1: 1.30 L (71 %pred), FEV1/FVC: 58% , TLC: invalid, DLCO 78% pred, 02/14/17 CXR: chronic changes of R apical and lingular scarring  INTERVAL: Seen in ED 01/04/18 with L lateral chest wall pain and mild SOB.   Subj: Last seen by me 11/16/17 at which time I treated her for acute sinusitis with Augmentin and prednisone.  She improved with that therapy. This is a routine re-eval. Presently, she has mild dyspnea which is at her baseline.  She has minimal cough.  She is tearful and distressed due to recent loss of her daughter to lung cancer. She requests alprazolam to help her with anxiety and sleep.  Obj: Vitals:   03/01/18 1029 03/01/18 1034  BP:  (!) 162/90  Pulse:  (!) 106  Resp: 16   SpO2:  93%  Weight: 184 lb (83.5 kg)   Height: 5\' 4"  (1.626 m)   Room air  Gen: Tearful, no respiratory distress HEENT: No acute findings Neck: No JVD Chest: Breath sounds are full without wheezes or other adventitious sounds Cardiac: Regular, no murmur HKV:[id number], soft, NT, NABS Ext: No clubbing, cyanosis or edema  BMET    Component Value Date/Time   NA 133 (L) 01/04/2018 0921   K 5.2 (H) 01/04/2018 0921   CL 101 01/04/2018 0921   CO2 23 01/04/2018 0921   GLUCOSE 371 (H) 01/04/2018 0921   BUN 19 01/04/2018 0921   CREATININE 0.97 01/04/2018 0921   CALCIUM 9.1 01/04/2018 0921   GFRNONAA 52 (L) 01/04/2018 0921   GFRAA >60 01/04/2018 0921    CBC    Component Value Date/Time   WBC 7.5 01/04/2018 0921   RBC 4.68 01/04/2018 0921   HGB 14.3 01/04/2018 0921   HCT 42.7 01/04/2018 0921   PLT 284 01/04/2018 0921   MCV 91.3  01/04/2018 0921   MCH 30.5 01/04/2018 0921   MCHC 33.4 01/04/2018 0921   RDW 14.1 01/04/2018 0921    CXR: NNF   IMPRESSION: 1) Mild COPD, with chronic asthmatic bronchitis 2) Despondent over recent loss of her daughter, requests Xanax 3) Reactive hypertension  PLAN/RECS:  Continue Breo inhaler - one inhalation daily Cont albuterol as needed I have prescribed a small amount of Xanax to be used as needed for anxiety and sleep  Further refills of this medication should be obtained through her primary care provider  Otherwise, follow-up in 4-6 months   Merton Border, MD PCCM service Mobile [phone] Pager [phone]  03/01/2018 10:39 AM

## 2018-03-01 NOTE — Patient Instructions (Addendum)
Continue Breo and albuterol as previously prescribed I have prescribed a small amount of Xanax to be used as needed for anxiety and sleep  Further refills of this medication should be obtained through your primary care provider  Follow up in 6 months or sooner as needed

## 2018-04-13 ENCOUNTER — Encounter: Payer: Self-pay | Admitting: Internal Medicine

## 2018-04-13 ENCOUNTER — Ambulatory Visit (INDEPENDENT_AMBULATORY_CARE_PROVIDER_SITE_OTHER): Payer: Medicare Other | Admitting: Internal Medicine

## 2018-04-13 VITALS — BP 132/80 | HR 88 | Temp 97.6°F

## 2018-04-13 DIAGNOSIS — F5104 Psychophysiologic insomnia: Secondary | ICD-10-CM | POA: Diagnosis not present

## 2018-04-13 DIAGNOSIS — M1991 Primary osteoarthritis, unspecified site: Secondary | ICD-10-CM

## 2018-04-13 DIAGNOSIS — F4321 Adjustment disorder with depressed mood: Secondary | ICD-10-CM

## 2018-04-13 MED ORDER — CYCLOBENZAPRINE HCL 10 MG PO TABS: 10.0000 mg | ORAL_TABLET | Freq: Every day | ORAL | 0 refills | Status: DC | PRN

## 2018-04-13 MED ORDER — ALPRAZOLAM 0.25 MG PO TABS: 0.2500 mg | ORAL_TABLET | Freq: Every day | ORAL | 0 refills | Status: DC | PRN

## 2018-04-13 NOTE — Patient Instructions (Signed)
Coping With Loss, Adult People experience loss in many different ways throughout their lives. Events such as moving, changing jobs, and losing friends can create a sense of loss. The loss may be as serious as a major health change, divorce, death of a pet, or death of a loved one. All of these types of loss are likely to create a physical and emotional reaction known as grief. Grief is the result of a major change or an absence of something or someone that you count on. Grief is a normal reaction to loss. How to recognize changes A variety of factors can affect your grieving experience, including:  The nature of your loss.  Your relationship to what or whom you lost.  Your understanding of grief and how to cope with it.  Your support system.  The way that you deal with your grief will affect your ability to function as you normally do. When you are grieving, you may experience:  Numbness, shock, sadness, anxiety, anger, denial, and guilt.  Thoughts about death.  Unexpected crying.  A physical sensation of emptiness in your gut.  Problems sleeping and eating.  Fatigue.  Loss of interest in normal activities.  Dreaming about or imagining seeing the person who died.  A need to remember what or whom you lost.  Difficulty thinking about anything other than your loss for a period of time.  Relief. If you have been expecting the loss for a while, you may feel a sense of relief when it happens.  Where to find support To get support for coping with loss:  Ask your health care provider for help and recommendations, such as grief counseling or therapy.  Think about joining a support group for people who are coping with loss.  Follow these instructions at home:  Be patient with yourself and others. Allow the grieving process to happen, and remember that grieving takes time. ? It is likely that you may never feel completely done with some grief. You may find a way to move on while  still cherishing memories and feelings about your loss. ? Accepting your loss is a process. It can take months or longer to adjust.  Express your feelings in healthy ways, such as: ? Talking with others about your loss. It may be helpful to find others who have had a similar loss, such as a support group. ? Writing down your feelings in a journal. ? Doing physical activities to release stress and emotional energy. ? Doing creative activities like painting, sculpting, or playing or listening to music. ? Practicing resilience. This is the ability to recover and adjust after facing challenges. Reading some resources that encourage resilience may help you to learn ways to practice those behaviors.  Keep to your normal routine as much as possible. If you have trouble focusing or doing normal activities, it is acceptable to take some time away from your normal routine.  Spend time with friends and loved ones.  Eat a healthy diet, get plenty of sleep, and rest when you feel tired. Where to find more information: You can find more information about coping with loss from:  American Society of Clinical Oncology: www.cancer.net  American Psychological Association: www.apa.org  Contact a health care provider if:  Your grief is extreme and keeps getting worse.  You have ongoing grief that does not improve.  Your body shows symptoms of grief, such as illness.  You feel depressed, anxious, or lonely. Get help right away if:  You have   thoughts about hurting yourself or others. If you ever feel like you may hurt yourself or others, or have thoughts about taking your own life, get help right away. You can go to your nearest emergency department or call:  Your local emergency services (911 in the [place]  A suicide crisis helpline, such as the National Suicide Prevention Lifeline at 1-800-273-8255. This is open 24 hours a day.  Summary  Grief is a normal part of experiencing a loss. It is the  result of a major change or an absence of something or someone that you count on.  The depth of grief and the period of recovery depend on the type of loss as well as your ability to adjust to the change and process your feelings.  Processing grief requires patience and a willingness to accept your feelings and talk about your loss with people who are supportive.  It is important to find resources that work for you and to realize that we are all different when it comes to grief. There is not one single grieving process that works for everyone in the same way.  Be aware that when grief becomes extreme, it can lead to more severe issues like isolation, depression, anxiety, or suicidal thoughts. Talk with your health care provider if you have any of these issues. This information is not intended to replace advice given to you by your health care provider. Make sure you discuss any questions you have with your health care provider. Document Released: 03/16/2017 Document Revised: 03/16/2017 Document Reviewed: 03/16/2017 Elsevier Interactive Patient Education  2018 [employer]vier Inc.  

## 2018-04-13 NOTE — Addendum Note (Signed)
Addended by: Lurlean Nanny on: 04/13/2018 05:44 PM   Modules accepted: Orders

## 2018-04-13 NOTE — Progress Notes (Signed)
Subjective:    Patient ID: Megan Bradshaw, female    DOB: 1929/12/08, 84 y.o.   MRN: [id number]  HPI  Pt presents to the clinic today with c/o grief and insomnia. She reports this started a few months ago after her oldest daughter died of cancer. She reports she recently had another daughter die of an aneurysm unexpectedly on Tuesday. They have a memorial service planned for Wednesday. She had to take Xanax after the death of her oldest daughter. She would like a refill of that today.   She also would like a refill of Flexeril for her arthritic pain.   Review of Systems  Past Medical History:  Diagnosis Date  . Arthritis   . Asthma   . COPD (chronic obstructive pulmonary disease) ([place])   . Diabetes ([place])   . Thyroid disease     Current Outpatient Medications  Medication Sig Dispense Refill  . albuterol (PROVENTIL) (2.5 MG/3ML) 0.083% nebulizer solution Take 3 mLs (2.5 mg total) by nebulization every 4 (four) hours as needed for wheezing or shortness of breath. 150 mL 5  . albuterol (VENTOLIN HFA) 108 (90 Base) MCG/ACT inhaler inhale 1 to 2 puffs by mouth every 6 hours pain for wheezing or shortness of breath 18 g 1  . ALPRAZolam (XANAX) 0.25 MG tablet Take 1 tablet (0.25 mg total) by mouth 2 (two) times daily as needed for anxiety or sleep. 10 tablet 0  . Cholecalciferol (VITAMIN D PO) Take 1 tablet by mouth daily.    . clobetasol cream (TEMOVATE) 1.01 % Apply 1 application topically 2 (two) times daily. 30 g 2  . cyclobenzaprine (FLEXERIL) 10 MG tablet Take 1 tablet (10 mg total) by mouth 3 (three) times daily as needed for muscle spasms. 21 tablet 0  . Fluticasone Furoate-Vilanterol (BREO ELLIPTA IN) Inhale 1 puff into the lungs daily.    Marland Kitchen glipiZIDE (GLIPIZIDE XL) 10 MG 24 hr tablet Take 1 tablet (10 mg total) by mouth daily with breakfast. 90 tablet 1  . levocetirizine (XYZAL) 5 MG tablet take 1 tablet by mouth once daily if needed 30 tablet 3  . levothyroxine (SYNTHROID,  LEVOTHROID) 75 MCG tablet take 1 tablet by mouth once daily BEFORE BREAKFAST 90 tablet 1  . meloxicam (MOBIC) 15 MG tablet Take 1 tablet (15 mg total) by mouth every other day. 15 tablet 1  . metFORMIN (GLUCOPHAGE) 500 MG tablet Take 1 tablet (500 mg total) by mouth 2 (two) times daily with a meal. 180 tablet 0  . Multiple Vitamin (MULTIVITAMIN) capsule Take 1 capsule by mouth daily.    Marland Kitchen nystatin (MYCOSTATIN/NYSTOP) powder Apply topically 2 (two) times daily. 30 g 0  . oxybutynin (DITROPAN-XL) 5 MG 24 hr tablet Take 1 tablet (5 mg total) by mouth at bedtime. 30 tablet 2  . predniSONE (DELTASONE) 5 MG tablet Take 1 tablet (5 mg total) by mouth every other day. 45 tablet 5   No current facility-administered medications for this visit.     No Known Allergies  Family History  Problem Relation Age of Onset  . Arthritis Father   . Diabetes Sister   . Diabetes Daughter   . Cancer Neg Hx   . Heart disease Neg Hx   . Stroke Neg Hx     Social History   Socioeconomic History  . Marital status: Widowed    Spouse name: Not on file  . Number of children: Not on file  . Years of education: Not on file  .  Highest education level: Not on file  Occupational History  . Occupation: Retired  Scientific laboratory technician  . Financial resource strain: Not on file  . Food insecurity:    Worry: Not on file    Inability: Not on file  . Transportation needs:    Medical: Not on file    Non-medical: Not on file  Tobacco Use  . Smoking status: Former Smoker    Packs/day: 1.50    Years: 30.00    Pack years: 45.00    Types: Cigarettes    Last attempt to quit: 11/15/1987    Years since quitting: 30.4  . Smokeless tobacco: Never Used  Substance and Sexual Activity  . Alcohol use: No    Alcohol/week: 0.0 oz  . Drug use: No  . Sexual activity: Not on file  Lifestyle  . Physical activity:    Days per week: Not on file    Minutes per session: Not on file  . Stress: Not on file  Relationships  . Social  connections:    Talks on phone: Not on file    Gets together: Not on file    Attends religious service: Not on file    Active member of club or organization: Not on file    Attends meetings of clubs or organizations: Not on file    Relationship status: Not on file  . Intimate partner violence:    Fear of current or ex partner: Not on file    Emotionally abused: Not on file    Physically abused: Not on file    Forced sexual activity: Not on file  Other Topics Concern  . Not on file  Social History Narrative  . Not on file     Constitutional: Denies fever, malaise, fatigue, headache or abrupt weight changes.  Neurological: Pt reports insomnia.  Psych: Pt reports grief. Denies anxiety, depression, SI/HI.  No other specific complaints in a complete review of systems (except as listed in HPI above).     Objective:   Physical Exam  BP 132/80   Pulse 88   Temp 97.6 F (36.4 C) (Oral)   SpO2 97%  Wt Readings from Last 3 Encounters:  03/01/18 184 lb (83.5 kg)  01/04/18 180 lb (81.6 kg)  12/07/17 188 lb (85.3 kg)    General: Appears her stated age, in NAD. Musculoskeletal: No difficulty with gait.  Neurological: Alert and oriented.  Psychiatric: She is tearful today. Judgment and thought content normal.    BMET    Component Value Date/Time   NA 133 (L) 01/04/2018 0921   K 5.2 (H) 01/04/2018 0921   CL 101 01/04/2018 0921   CO2 23 01/04/2018 0921   GLUCOSE 371 (H) 01/04/2018 0921   BUN 19 01/04/2018 0921   CREATININE 0.97 01/04/2018 0921   CALCIUM 9.1 01/04/2018 0921   GFRNONAA 52 (L) 01/04/2018 0921   GFRAA >60 01/04/2018 0921    Lipid Panel     Component Value Date/Time   CHOL 163 12/07/2017 1113   TRIG 117.0 12/07/2017 1113   HDL 57.60 12/07/2017 1113   CHOLHDL 3 12/07/2017 1113   VLDL 23.4 12/07/2017 1113   LDLCALC 82 12/07/2017 1113    CBC    Component Value Date/Time   WBC 7.5 01/04/2018 0921   RBC 4.68 01/04/2018 0921   HGB 14.3 01/04/2018 0921     HCT 42.7 01/04/2018 0921   PLT 284 01/04/2018 0921   MCV 91.3 01/04/2018 0921   MCH 30.5 01/04/2018 8469  MCHC 33.4 01/04/2018 0921   RDW 14.1 01/04/2018 0921    Hgb A1C Lab Results  Component Value Date   HGBA1C 8.1 (H) 12/07/2017            Assessment & Plan:   Grief and Insomnia:  Support offered today Xanax refilled today If grief is prolonged, consider treatment with SSRI  OA:  Flexeril refilled today  RTC as needed or if symptoms persist or worsen Webb Silversmith, NP

## 2018-06-07 ENCOUNTER — Encounter: Payer: Self-pay | Admitting: Family Medicine

## 2018-06-07 ENCOUNTER — Ambulatory Visit (INDEPENDENT_AMBULATORY_CARE_PROVIDER_SITE_OTHER): Payer: Medicare Other | Admitting: Family Medicine

## 2018-06-07 DIAGNOSIS — R030 Elevated blood-pressure reading, without diagnosis of hypertension: Secondary | ICD-10-CM | POA: Insufficient documentation

## 2018-06-07 DIAGNOSIS — T380X5A Adverse effect of glucocorticoids and synthetic analogues, initial encounter: Secondary | ICD-10-CM

## 2018-06-07 DIAGNOSIS — L03115 Cellulitis of right lower limb: Secondary | ICD-10-CM | POA: Diagnosis not present

## 2018-06-07 DIAGNOSIS — E099 Drug or chemical induced diabetes mellitus without complications: Secondary | ICD-10-CM | POA: Diagnosis not present

## 2018-06-07 MED ORDER — CEPHALEXIN 500 MG PO CAPS: 500.0000 mg | ORAL_CAPSULE | Freq: Three times a day (TID) | ORAL | 0 refills | Status: DC

## 2018-06-07 NOTE — Patient Instructions (Addendum)
Follow BP at home .Marland Kitchen Goal < 140/90.  Complete antibiotics x 7 days  Elevate leg above heart as able   Call if redness spreading past marker edge, new fever or increased pain.

## 2018-06-07 NOTE — Assessment & Plan Note (Signed)
Diabetic high risk for infection. Start antibiotics and close follow up in 1 week. Pt given return precautions.   No clear exposure to MRSA.

## 2018-06-07 NOTE — Assessment & Plan Note (Signed)
Overdue for follow up with PCP...pt to make appt.

## 2018-06-07 NOTE — Progress Notes (Signed)
Subjective:    Patient ID: Megan Bradshaw, female    DOB: November 08, 1930, 84 y.o.   MRN: [id number]  HPI    84 year old female pt presents for new onset laceration right leg 1 week ago.  She reports she hit a car door with her leg, cut through pants. She has been treating with peroxide, neosporin.   She feels like it is not healing as it is supposed to.. Redness around lesion. Area is painful   She has diabetes .  Her BP is usually well controlled.  She is teraful given daughter x 2 died in last 6 months. BP Readings from Last 3 Encounters:  06/07/18 (!) 156/90  04/13/18 132/80  03/01/18 (!) 162/90    Lab Results  Component Value Date   HGBA1C 8.1 (H) 12/07/2017    Social History /Family History/Past Medical History reviewed in detail and updated in EMR if needed. Blood pressure (!) 156/90, pulse 74, temperature 98.1 F (36.7 C), temperature source Oral, height 5\' 4"  (1.626 m), SpO2 97 %.  Review of Systems  Constitutional: Negative for fatigue and fever.  HENT: Negative for congestion.   Eyes: Negative for pain.  Respiratory: Negative for cough and shortness of breath.   Cardiovascular: Negative for chest pain, palpitations and leg swelling.  Gastrointestinal: Negative for abdominal pain.  Genitourinary: Negative for dysuria and vaginal bleeding.  Musculoskeletal: Negative for back pain.  Neurological: Negative for syncope, light-headedness and headaches.  Psychiatric/Behavioral: Positive for dysphoric mood. Negative for sleep disturbance.       Objective:   Physical Exam  Constitutional: Vital signs are normal. She appears well-developed and well-nourished. She is cooperative.  Non-toxic appearance. She does not appear ill. No distress.  HENT:  Head: Normocephalic.  Right Ear: Hearing, tympanic membrane, external ear and ear canal normal. Tympanic membrane is not erythematous, not retracted and not bulging.  Left Ear: Hearing, tympanic membrane, external ear and ear  canal normal. Tympanic membrane is not erythematous, not retracted and not bulging.  Nose: No mucosal edema or rhinorrhea. Right sinus exhibits no maxillary sinus tenderness and no frontal sinus tenderness. Left sinus exhibits no maxillary sinus tenderness and no frontal sinus tenderness.  Mouth/Throat: Uvula is midline, oropharynx is clear and moist and mucous membranes are normal.  Eyes: Pupils are equal, round, and reactive to light. Conjunctivae, EOM and lids are normal. Lids are everted and swept, no foreign bodies found.  Neck: Trachea normal and normal range of motion. Neck supple. Carotid bruit is not present. No thyroid mass and no thyromegaly present.  Cardiovascular: Normal rate, regular rhythm, S1 normal, S2 normal, normal heart sounds, intact distal pulses and normal pulses. Exam reveals no gallop and no friction rub.  No murmur heard. Pulmonary/Chest: Effort normal and breath sounds normal. No tachypnea. No respiratory distress. She has no decreased breath sounds. She has no wheezes. She has no rhonchi. She has no rales.  Abdominal: Soft. Normal appearance and bowel sounds are normal. There is no tenderness.  Neurological: She is alert.  Skin: Skin is warm, dry and intact. No rash noted.   Right anterior lower leg with skin avulsion 2 cm diameter with 4 cm surrounding erythema and tenderness.   bilateral lower ext edema  Psychiatric: Her speech is normal and behavior is normal. Judgment and thought content normal. Her mood appears not anxious. Cognition and memory are normal. She does not exhibit a depressed mood.          Assessment & Plan:

## 2018-06-07 NOTE — Assessment & Plan Note (Signed)
May be elevated due to leg pain or stress and loss of daughters. Follow up with PCP.

## 2018-06-07 NOTE — Addendum Note (Signed)
Addended byEliezer Lofts E on: 06/07/2018 12:08 PM   Modules accepted: Orders

## 2018-06-15 ENCOUNTER — Encounter: Payer: Self-pay | Admitting: Internal Medicine

## 2018-06-15 ENCOUNTER — Ambulatory Visit (INDEPENDENT_AMBULATORY_CARE_PROVIDER_SITE_OTHER): Payer: Medicare Other | Admitting: Internal Medicine

## 2018-06-15 VITALS — BP 140/86 | HR 85 | Temp 97.7°F

## 2018-06-15 DIAGNOSIS — J449 Chronic obstructive pulmonary disease, unspecified: Secondary | ICD-10-CM | POA: Diagnosis not present

## 2018-06-15 DIAGNOSIS — L03115 Cellulitis of right lower limb: Secondary | ICD-10-CM | POA: Diagnosis not present

## 2018-06-15 MED ORDER — PREDNISONE 5 MG PO TABS: 5.0000 mg | ORAL_TABLET | ORAL | 1 refills | Status: DC

## 2018-06-15 MED ORDER — CEPHALEXIN 500 MG PO CAPS: 500.0000 mg | ORAL_CAPSULE | Freq: Three times a day (TID) | ORAL | 0 refills | Status: DC

## 2018-06-15 MED ORDER — METFORMIN HCL 500 MG PO TABS: 500.0000 mg | ORAL_TABLET | Freq: Two times a day (BID) | ORAL | 1 refills | Status: DC

## 2018-06-15 NOTE — Progress Notes (Signed)
Subjective:    Patient ID: Megan Bradshaw, female    DOB: August 19, 1930, 84 y.o.   MRN: [id number]  HPI  Pt presents to the clinic today for follow up of cellulitis. She reports she was seen 1 week ago for the same. She was treated with Keflex. She has taken all the medication as prescribed. She has noticed some improvement in the redness and pain. She has been elevating her legs. She leaves for [facility] next week.  She also wants a refill of her Metformin and Prednsione.  She would also like a new nebulizer machine.  Review of Systems      Past Medical History:  Diagnosis Date  . Arthritis   . Asthma   . COPD (chronic obstructive pulmonary disease) ([place])   . Diabetes ([place])   . Thyroid disease     Current Outpatient Medications  Medication Sig Dispense Refill  . albuterol (PROVENTIL) (2.5 MG/3ML) 0.083% nebulizer solution Take 3 mLs (2.5 mg total) by nebulization every 4 (four) hours as needed for wheezing or shortness of breath. 150 mL 5  . albuterol (VENTOLIN HFA) 108 (90 Base) MCG/ACT inhaler inhale 1 to 2 puffs by mouth every 6 hours pain for wheezing or shortness of breath 18 g 1  . ALPRAZolam (XANAX) 0.25 MG tablet Take 1 tablet (0.25 mg total) by mouth daily as needed for anxiety or sleep. 20 tablet 0  . cephALEXin (KEFLEX) 500 MG capsule Take 1 capsule (500 mg total) by mouth 3 (three) times daily. 21 capsule 0  . Cholecalciferol (VITAMIN D PO) Take 1 tablet by mouth daily.    . clobetasol cream (TEMOVATE) 4.81 % Apply 1 application topically 2 (two) times daily. 30 g 2  . cyclobenzaprine (FLEXERIL) 10 MG tablet Take 1 tablet (10 mg total) by mouth daily as needed for muscle spasms. 20 tablet 0  . Fluticasone Furoate-Vilanterol (BREO ELLIPTA IN) Inhale 1 puff into the lungs daily.    Marland Kitchen glipiZIDE (GLIPIZIDE XL) 10 MG 24 hr tablet Take 1 tablet (10 mg total) by mouth daily with breakfast. 90 tablet 1  . levocetirizine (XYZAL) 5 MG tablet take 1 tablet by mouth once daily if  needed 30 tablet 3  . levothyroxine (SYNTHROID, LEVOTHROID) 75 MCG tablet take 1 tablet by mouth once daily BEFORE BREAKFAST 90 tablet 1  . meloxicam (MOBIC) 15 MG tablet Take 1 tablet (15 mg total) by mouth every other day. 15 tablet 1  . metFORMIN (GLUCOPHAGE) 500 MG tablet Take 1 tablet (500 mg total) by mouth 2 (two) times daily with a meal. 180 tablet 0  . Multiple Vitamin (MULTIVITAMIN) capsule Take 1 capsule by mouth daily.    Marland Kitchen nystatin (MYCOSTATIN/NYSTOP) powder Apply topically 2 (two) times daily. 30 g 0  . oxybutynin (DITROPAN-XL) 5 MG 24 hr tablet Take 1 tablet (5 mg total) by mouth at bedtime. 30 tablet 2   No current facility-administered medications for this visit.     No Known Allergies  Family History  Problem Relation Age of Onset  . Arthritis Father   . Diabetes Sister   . Diabetes Daughter   . Cancer Neg Hx   . Heart disease Neg Hx   . Stroke Neg Hx     Social History   Socioeconomic History  . Marital status: Widowed    Spouse name: Not on file  . Number of children: Not on file  . Years of education: Not on file  . Highest education level: Not on file  Occupational History  . Occupation: Retired  Scientific laboratory technician  . Financial resource strain: Not on file  . Food insecurity:    Worry: Not on file    Inability: Not on file  . Transportation needs:    Medical: Not on file    Non-medical: Not on file  Tobacco Use  . Smoking status: Former Smoker    Packs/day: 1.50    Years: 30.00    Pack years: 45.00    Types: Cigarettes    Last attempt to quit: 11/15/1987    Years since quitting: 30.6  . Smokeless tobacco: Never Used  Substance and Sexual Activity  . Alcohol use: No    Alcohol/week: 0.0 oz  . Drug use: No  . Sexual activity: Not on file  Lifestyle  . Physical activity:    Days per week: Not on file    Minutes per session: Not on file  . Stress: Not on file  Relationships  . Social connections:    Talks on phone: Not on file    Gets together:  Not on file    Attends religious service: Not on file    Active member of club or organization: Not on file    Attends meetings of clubs or organizations: Not on file    Relationship status: Not on file  . Intimate partner violence:    Fear of current or ex partner: Not on file    Emotionally abused: Not on file    Physically abused: Not on file    Forced sexual activity: Not on file  Other Topics Concern  . Not on file  Social History Narrative  . Not on file     Constitutional: Denies fever, malaise, fatigue, headache or abrupt weight changes.  Skin: Pt reports skin tear to right lower leg. Denies rashes, lesions or ulcercations.    No other specific complaints in a complete review of systems (except as listed in HPI above).  Objective:   Physical Exam  BP 140/86   Pulse 85   Temp 97.7 F (36.5 C) (Oral)   SpO2 97%  Wt Readings from Last 3 Encounters:  03/01/18 184 lb (83.5 kg)  01/04/18 180 lb (81.6 kg)  12/07/17 188 lb (85.3 kg)    General: Appears her stated age, obese in NAD. Skin: 1 cm skin tear to right anterior lower leg. Still has a little redness and warmth. No drainage noted. Neurological: Alert and oriented.    BMET    Component Value Date/Time   NA 133 (L) 01/04/2018 0921   K 5.2 (H) 01/04/2018 0921   CL 101 01/04/2018 0921   CO2 23 01/04/2018 0921   GLUCOSE 371 (H) 01/04/2018 0921   BUN 19 01/04/2018 0921   CREATININE 0.97 01/04/2018 0921   CALCIUM 9.1 01/04/2018 0921   GFRNONAA 52 (L) 01/04/2018 0921   GFRAA >60 01/04/2018 0921    Lipid Panel     Component Value Date/Time   CHOL 163 12/07/2017 1113   TRIG 117.0 12/07/2017 1113   HDL 57.60 12/07/2017 1113   CHOLHDL 3 12/07/2017 1113   VLDL 23.4 12/07/2017 1113   LDLCALC 82 12/07/2017 1113    CBC    Component Value Date/Time   WBC 7.5 01/04/2018 0921   RBC 4.68 01/04/2018 0921   HGB 14.3 01/04/2018 0921   HCT 42.7 01/04/2018 0921   PLT 284 01/04/2018 0921   MCV 91.3 01/04/2018  0921   MCH 30.5 01/04/2018 0921   MCHC 33.4 01/04/2018  6962   RDW 14.1 01/04/2018 0921    Hgb A1C Lab Results  Component Value Date   HGBA1C 8.1 (H) 12/07/2017            Assessment & Plan:   Skin Tear of RLE with Celluliits:  Continue Keflex for additional 7 days- refilled TAB and bandaid placed over skin tear Encouraged elevation  Prednisone and Metformin refilled. RX provided for nebulizer  Return precaution discussed Webb Silversmith, NP

## 2018-06-15 NOTE — Patient Instructions (Signed)

## 2018-06-25 ENCOUNTER — Telehealth: Payer: Self-pay | Admitting: Internal Medicine

## 2018-06-25 MED ORDER — LEVOTHYROXINE SODIUM 75 MCG PO TABS: ORAL_TABLET | ORAL | 0 refills | Status: DC

## 2018-06-25 NOTE — Telephone Encounter (Signed)
Copied from [place] [id number]. Topic: Quick Communication - Rx Refill/Question >> Jun 25, 2018  1:02 PM Scherrie Gerlach wrote: Medication: levothyroxine (SYNTHROID, LEVOTHROID) 75 MCG tablet  Has the patient contacted their pharmacy? Yes,  she said she did, but I do not see we received anything 90 day [facility] [place], [place] [phone] (Phone) [phone] (Fax)

## 2018-07-07 ENCOUNTER — Other Ambulatory Visit: Payer: Self-pay | Admitting: Internal Medicine

## 2018-08-07 ENCOUNTER — Ambulatory Visit (INDEPENDENT_AMBULATORY_CARE_PROVIDER_SITE_OTHER)
Admission: RE | Admit: 2018-08-07 | Discharge: 2018-08-07 | Disposition: A | Discharge status: Home / Self Care | Payer: Medicare Other | Source: Ambulatory Visit | Attending: Internal Medicine | Admitting: Internal Medicine

## 2018-08-07 ENCOUNTER — Ambulatory Visit (INDEPENDENT_AMBULATORY_CARE_PROVIDER_SITE_OTHER): Payer: Medicare Other | Admitting: Internal Medicine

## 2018-08-07 ENCOUNTER — Encounter: Payer: Self-pay | Admitting: Internal Medicine

## 2018-08-07 VITALS — BP 130/84 | HR 81 | Temp 97.8°F | Wt 187.0 lb

## 2018-08-07 DIAGNOSIS — T380X5A Adverse effect of glucocorticoids and synthetic analogues, initial encounter: Secondary | ICD-10-CM

## 2018-08-07 DIAGNOSIS — E099 Drug or chemical induced diabetes mellitus without complications: Secondary | ICD-10-CM | POA: Diagnosis not present

## 2018-08-07 DIAGNOSIS — R5383 Other fatigue: Secondary | ICD-10-CM

## 2018-08-07 DIAGNOSIS — R0602 Shortness of breath: Secondary | ICD-10-CM

## 2018-08-07 DIAGNOSIS — R05 Cough: Secondary | ICD-10-CM | POA: Diagnosis not present

## 2018-08-07 DIAGNOSIS — R059 Cough, unspecified: Secondary | ICD-10-CM

## 2018-08-07 MED ORDER — HYDROCOD POLST-CPM POLST ER 10-8 MG/5ML PO SUER: 5.0000 mL | Freq: Every evening | ORAL | 0 refills | Status: DC | PRN

## 2018-08-07 NOTE — Progress Notes (Signed)
HPI  Pt presents to the clinic today with c/o fatigue, nasal congestion, cough and shortness of breath. She reports this started 4 days ago. She is not able to blow anything out of her nose. The cough is non productive. She denies runny nose, ear pain or sore throat. She denies fever or chills but has had body aches. She has used her nebulizer without any relief. She has a history of asthma and COPD.  Review of Systems      Past Medical History:  Diagnosis Date  . Arthritis   . Asthma   . COPD (chronic obstructive pulmonary disease) ([place])   . Diabetes ([place])   . Thyroid disease     Family History  Problem Relation Age of Onset  . Arthritis Father   . Diabetes Sister   . Diabetes Daughter   . Cancer Neg Hx   . Heart disease Neg Hx   . Stroke Neg Hx     Social History   Socioeconomic History  . Marital status: Widowed    Spouse name: Not on file  . Number of children: Not on file  . Years of education: Not on file  . Highest education level: Not on file  Occupational History  . Occupation: Retired  Scientific laboratory technician  . Financial resource strain: Not on file  . Food insecurity:    Worry: Not on file    Inability: Not on file  . Transportation needs:    Medical: Not on file    Non-medical: Not on file  Tobacco Use  . Smoking status: Former Smoker    Packs/day: 1.50    Years: 30.00    Pack years: 45.00    Types: Cigarettes    Last attempt to quit: 11/15/1987    Years since quitting: 30.7  . Smokeless tobacco: Never Used  Substance and Sexual Activity  . Alcohol use: No    Alcohol/week: 0.0 standard drinks  . Drug use: No  . Sexual activity: Not on file  Lifestyle  . Physical activity:    Days per week: Not on file    Minutes per session: Not on file  . Stress: Not on file  Relationships  . Social connections:    Talks on phone: Not on file    Gets together: Not on file    Attends religious service: Not on file    Active member of club or organization: Not on  file    Attends meetings of clubs or organizations: Not on file    Relationship status: Not on file  . Intimate partner violence:    Fear of current or ex partner: Not on file    Emotionally abused: Not on file    Physically abused: Not on file    Forced sexual activity: Not on file  Other Topics Concern  . Not on file  Social History Narrative  . Not on file    No Known Allergies   Constitutional: Positive fatigue. Denies headache, fever or abrupt weight changes.  HEENT:  Positive nasal congestion. Denies eye redness, eye pain, pressure behind the eyes, facial pain, nasal congestion, ear pain, ringing in the ears, wax buildup, runny nose or bloody nose. Respiratory: Positive cough and shortness of breath. Denies difficulty breathing.  Cardiovascular: Denies chest pain, chest tightness, palpitations or swelling in the hands or feet.   No other specific complaints in a complete review of systems (except as listed in HPI above).  Objective:   BP 130/84   Pulse  81   Temp 97.8 F (36.6 C) (Oral)   Wt 187 lb (84.8 kg)   SpO2 97%   BMI 32.10 kg/m   Wt Readings from Last 3 Encounters:  08/07/18 187 lb (84.8 kg)  03/01/18 184 lb (83.5 kg)  01/04/18 180 lb (81.6 kg)     General: Appears her stated age, in NAD. HEENT: Head: normal shape and size, no sinus tenderness noted;  Ears: Tm's gray and intact, normal light reflex; Nose: mucosa pink and moist, septum midline; Throat/Mouth: + PND. Teeth present, mucosa pink and moist, no exudate noted, no lesions or ulcerations noted.  Neck: No cervical lymphadenopathy.   Pulmonary/Chest: Normal effort and positive vesicular breath sounds. No respiratory distress. No wheezes, rales or ronchi noted.       Assessment & Plan:   Viral URI with Cough, Fatigue:  Get some rest and drink plenty of water Start Flonase OTC Continue neb treatments CBC, CMET, TSH Chest xray today to r/o pneumonia eRx for Tussionex cough syrup  Steroid  Induced Diabetes:  A1C today Continue Metformin andGlipizide for now If A1C elevated, consider adding Januvia  RTC as needed or if symptoms persist.   Webb Silversmith, NP

## 2018-08-07 NOTE — Patient Instructions (Signed)

## 2018-08-08 LAB — CBC
HCT: 42.5 % (ref 36.0–46.0)
Hemoglobin: 14.6 g/dL (ref 12.0–15.0)
MCHC: 34.3 g/dL (ref 30.0–36.0)
MCV: 89.5 fl (ref 78.0–100.0)
Platelets: 367 K/uL (ref 150.0–400.0)
RBC: 4.75 Mil/uL (ref 3.87–5.11)
RDW: 14 % (ref 11.5–15.5)
WBC: 10.2 K/uL (ref 4.0–10.5)

## 2018-08-08 LAB — COMPREHENSIVE METABOLIC PANEL
ALBUMIN: 4.3 g/dL (ref 3.5–5.2)
ALK PHOS: 63 U/L (ref 39–117)
ALT: 16 U/L (ref 0–35)
AST: 20 U/L (ref 0–37)
BILIRUBIN TOTAL: 0.4 mg/dL (ref 0.2–1.2)
BUN: 20 mg/dL (ref 6–23)
CO2: 26 mEq/L (ref 19–32)
CREATININE: 1.08 mg/dL (ref 0.40–1.20)
Calcium: 9.5 mg/dL (ref 8.4–10.5)
Chloride: 97 mEq/L (ref 96–112)
GFR: 51.11 mL/min — AB (ref >60.00)
Glucose, Bld: 96 mg/dL (ref 70–99)
Potassium: 4.7 mEq/L (ref 3.5–5.1)
Sodium: 133 mEq/L — ABNORMAL LOW (ref 135–145)
TOTAL PROTEIN: 7.6 g/dL (ref 6.0–8.3)

## 2018-08-08 LAB — HEMOGLOBIN A1C: HEMOGLOBIN A1C: 7.6 % — AB (ref 4.6–6.5)

## 2018-08-08 LAB — TSH: TSH: 3.64 u[IU]/mL (ref 0.35–4.50)

## 2018-08-23 ENCOUNTER — Ambulatory Visit (INDEPENDENT_AMBULATORY_CARE_PROVIDER_SITE_OTHER): Payer: Medicare Other

## 2018-08-23 DIAGNOSIS — Z23 Encounter for immunization: Secondary | ICD-10-CM | POA: Diagnosis not present

## 2018-09-17 ENCOUNTER — Encounter: Payer: Self-pay | Admitting: Pulmonary Disease

## 2018-09-17 ENCOUNTER — Ambulatory Visit (INDEPENDENT_AMBULATORY_CARE_PROVIDER_SITE_OTHER): Payer: Medicare Other | Admitting: Pulmonary Disease

## 2018-09-17 VITALS — BP 142/82 | HR 82 | Ht 64.0 in | Wt 188.0 lb

## 2018-09-17 DIAGNOSIS — R053 Chronic cough: Secondary | ICD-10-CM

## 2018-09-17 DIAGNOSIS — J449 Chronic obstructive pulmonary disease, unspecified: Secondary | ICD-10-CM

## 2018-09-17 DIAGNOSIS — R05 Cough: Secondary | ICD-10-CM

## 2018-09-17 DIAGNOSIS — J310 Chronic rhinitis: Secondary | ICD-10-CM | POA: Diagnosis not present

## 2018-09-17 DIAGNOSIS — M810 Age-related osteoporosis without current pathological fracture: Secondary | ICD-10-CM | POA: Diagnosis not present

## 2018-09-17 MED ORDER — FLUTICASONE FUROATE-VILANTEROL 100-25 MCG/INH IN AEPB: 1.0000 | INHALATION_SPRAY | Freq: Every day | RESPIRATORY_TRACT | 10 refills | Status: DC

## 2018-09-17 MED ORDER — LEVOCETIRIZINE DIHYDROCHLORIDE 5 MG PO TABS: ORAL_TABLET | ORAL | 5 refills | Status: DC

## 2018-09-17 NOTE — Progress Notes (Signed)
Pt profile: 84 y.o. F moved from [place]. Initially seen by Dr Melvyn Novas. Diagnosed with COPD in 2011. Graded as Gold I by Dr Melvyn Novas. Chronic prednisone therapy  PROBLEMS: Gold I COPD with chronic asthmatic bronchitis Chronic refractory cough  DATA: 10/22/14 PFTs: mild obstruction, FEV1 1.36 L (81%), FEV1/FVC 53%, TLC normal, DLCO 51% pred 02/16/16 PFTs: FVC: 2.23 L (90 %pred), FEV1: 1.30 L (71 %pred), FEV1/FVC: 58% , TLC: invalid, DLCO 78% pred  INTERVAL: Last seen in this office 03/01/2018.  No major pulmonary events  Subj: This is a scheduled office follow-up.  She remains despondent over the recent death of her daughters.  She continues to have mild chronic cough but is not terribly troubled by this.  She continues to have mild episodic dyspnea.  She remains on Breo inhaler.  She rarely uses her albuterol rescue inhaler.  She remains on prednisone 5 mg every other day.  She complains of nasal congestion and posterior nasal drainage.  She denies CP, fever, purulent sputum, hemoptysis, LE edema and calf tenderness.   Obj: Vitals:   09/17/18 0919 09/17/18 0923  BP:  (!) 142/82  Pulse:  82  SpO2:  98%  Weight: 188 lb (85.3 kg)   Height: 5\' 4"  (1.626 m)   RA  Gen: NAD HEENT: NCAT, sclera white Neck: No JVD Lungs: breath sounds full and slightly coarse without wheezes or other adventitious sounds Cardiovascular: RRR, no murmurs Abdomen: Soft, nontender, normal BS Ext: without clubbing, cyanosis, edema Neuro: grossly intact Skin: Limited exam, no lesions noted    BMET    Component Value Date/Time   NA 133 (L) 08/07/2018 1649   K 4.7 08/07/2018 1649   CL 97 08/07/2018 1649   CO2 26 08/07/2018 1649   GLUCOSE 96 08/07/2018 1649   BUN 20 08/07/2018 1649   CREATININE 1.08 08/07/2018 1649   CALCIUM 9.5 08/07/2018 1649   GFRNONAA 52 (L) 01/04/2018 0921   GFRAA >60 01/04/2018 0921    CBC    Component Value Date/Time   WBC 10.2 08/07/2018 1649   RBC 4.75 08/07/2018 1649   HGB 14.6  08/07/2018 1649   HCT 42.5 08/07/2018 1649   PLT 367.0 08/07/2018 1649   MCV 89.5 08/07/2018 1649   MCH 30.5 01/04/2018 0921   MCHC 34.3 08/07/2018 1649   RDW 14.0 08/07/2018 1649    CXR 9/24: Moderate kyphosis.  Chronic interstitial prominence.  No acute findings   IMPRESSION: 1) Mild COPD with chronic asthmatic bronchitis 2) chronic cough 3) chronic rhinitis, likely allergic 4) osteoporosis.  Note chronic prednisone therapy  PLAN/RECS:  Continue Breo inhaler - one inhalation daily Cont albuterol as needed Resume antihistamine (Xyzal) Discontinue prednisone Follow-up in 6 months.  Call sooner if needed   Merton Border, MD PCCM service Mobile [phone] Pager [phone]  09/17/2018 9:43 AM

## 2018-09-17 NOTE — Patient Instructions (Signed)
Continue Breo inhaler Continue albuterol as needed Resume Xyzal, 1 pill daily Discontinue prednisone altogether Follow-up in 6 months.  Call sooner if needed

## 2018-09-19 ENCOUNTER — Encounter: Payer: Self-pay | Admitting: Internal Medicine

## 2018-09-19 ENCOUNTER — Ambulatory Visit (INDEPENDENT_AMBULATORY_CARE_PROVIDER_SITE_OTHER): Payer: Medicare Other | Admitting: Internal Medicine

## 2018-09-19 VITALS — BP 138/84 | HR 91 | Temp 98.1°F

## 2018-09-19 DIAGNOSIS — R140 Abdominal distension (gaseous): Secondary | ICD-10-CM

## 2018-09-19 DIAGNOSIS — M545 Low back pain, unspecified: Secondary | ICD-10-CM

## 2018-09-19 DIAGNOSIS — R1030 Lower abdominal pain, unspecified: Secondary | ICD-10-CM

## 2018-09-19 DIAGNOSIS — K5900 Constipation, unspecified: Secondary | ICD-10-CM | POA: Diagnosis not present

## 2018-09-19 LAB — POC URINALSYSI DIPSTICK (AUTOMATED)
Bilirubin, UA: NEGATIVE
GLUCOSE UA: NEGATIVE
Ketones, UA: NEGATIVE
NITRITE UA: POSITIVE
Protein, UA: NEGATIVE
RBC UA: NEGATIVE
Spec Grav, UA: 1.015 (ref 1.010–1.025)
UROBILINOGEN UA: 0.2 U/dL
pH, UA: 6 (ref 5.0–8.0)

## 2018-09-19 MED ORDER — CEPHALEXIN 250 MG PO CAPS: 250.0000 mg | ORAL_CAPSULE | Freq: Two times a day (BID) | ORAL | 0 refills | Status: DC

## 2018-09-19 NOTE — Addendum Note (Signed)
Addended by: Lurlean Nanny on: 09/19/2018 04:03 PM   Modules accepted: Orders

## 2018-09-19 NOTE — Addendum Note (Signed)
Addended by: Lurlean Nanny on: 09/19/2018 04:10 PM   Modules accepted: Orders

## 2018-09-19 NOTE — Progress Notes (Signed)
Subjective:    Patient ID: Megan Bradshaw, female    DOB: [birth year], 84 y.o.   MRN: [id number]  HPI  Pt presents to the clinic today with c/o abdominal pain and constipation. She reports this started about 2 days. She reports the abdominal pain is in the pelvic region. She describes the pain as cramping. The pain radiates around to her back on both sides. The pain is worse with movement. She is having some bloating. She denies urgency, frequency or dysuria. She denies vaginal discharge or abnormal bleeding. She reports she had a very small, hard BM yesterday. She denies nausea, vomiting, diarrhea. She has tried Ibuprofen with some relief. She reports she has no history of diverticulosis. She denies any injuries or falls.  Review of Systems      Past Medical History:  Diagnosis Date  . Arthritis   . Asthma   . COPD (chronic obstructive pulmonary disease) ([place])   . Diabetes ([place])   . Thyroid disease     Current Outpatient Medications  Medication Sig Dispense Refill  . albuterol (PROVENTIL) (2.5 MG/3ML) 0.083% nebulizer solution Take 3 mLs (2.5 mg total) by nebulization every 4 (four) hours as needed for wheezing or shortness of breath. 150 mL 5  . albuterol (VENTOLIN HFA) 108 (90 Base) MCG/ACT inhaler inhale 1 to 2 puffs by mouth every 6 hours pain for wheezing or shortness of breath 18 g 1  . ALPRAZolam (XANAX) 0.25 MG tablet Take 1 tablet (0.25 mg total) by mouth daily as needed for anxiety or sleep. 20 tablet 0  . Cholecalciferol (VITAMIN D PO) Take 1 tablet by mouth daily.    . clobetasol cream (TEMOVATE) 6.60 % Apply 1 application topically 2 (two) times daily. 30 g 2  . cyclobenzaprine (FLEXERIL) 10 MG tablet Take 1 tablet (10 mg total) by mouth daily as needed for muscle spasms. 20 tablet 0  . fluticasone furoate-vilanterol (BREO ELLIPTA) 100-25 MCG/INH AEPB Inhale 1 puff into the lungs daily. 60 each 10  . glipiZIDE (GLUCOTROL XL) 10 MG 24 hr tablet Take 1 tablet (10 mg total)  by mouth daily with breakfast. 90 tablet 0  . levocetirizine (XYZAL) 5 MG tablet take 1 tablet by mouth once daily as needed for cough and nasal congestion 30 tablet 5  . levothyroxine (SYNTHROID, LEVOTHROID) 75 MCG tablet take 1 tablet by mouth once daily BEFORE BREAKFAST 90 tablet 0  . meloxicam (MOBIC) 15 MG tablet Take 1 tablet (15 mg total) by mouth every other day. 15 tablet 1  . metFORMIN (GLUCOPHAGE) 500 MG tablet Take 1 tablet (500 mg total) by mouth 2 (two) times daily with a meal. (Patient taking differently: Take 500 mg by mouth daily with breakfast. ) 180 tablet 1  . Multiple Vitamin (MULTIVITAMIN) capsule Take 1 capsule by mouth daily.    Marland Kitchen nystatin (MYCOSTATIN/NYSTOP) powder Apply topically 2 (two) times daily. 30 g 0  . oxybutynin (DITROPAN-XL) 5 MG 24 hr tablet Take 1 tablet (5 mg total) by mouth at bedtime. 30 tablet 2   No current facility-administered medications for this visit.     No Known Allergies  Family History  Problem Relation Age of Onset  . Arthritis Father   . Diabetes Sister   . Diabetes Daughter   . Cancer Neg Hx   . Heart disease Neg Hx   . Stroke Neg Hx     Social History   Socioeconomic History  . Marital status: Widowed    Spouse name: Not  on file  . Number of children: Not on file  . Years of education: Not on file  . Highest education level: Not on file  Occupational History  . Occupation: Retired  Scientific laboratory technician  . Financial resource strain: Not on file  . Food insecurity:    Worry: Not on file    Inability: Not on file  . Transportation needs:    Medical: Not on file    Non-medical: Not on file  Tobacco Use  . Smoking status: Former Smoker    Packs/day: 1.50    Years: 30.00    Pack years: 45.00    Types: Cigarettes    Last attempt to quit: 11/15/1987    Years since quitting: 30.8  . Smokeless tobacco: Never Used  Substance and Sexual Activity  . Alcohol use: No    Alcohol/week: 0.0 standard drinks  . Drug use: No  . Sexual  activity: Not on file  Lifestyle  . Physical activity:    Days per week: Not on file    Minutes per session: Not on file  . Stress: Not on file  Relationships  . Social connections:    Talks on phone: Not on file    Gets together: Not on file    Attends religious service: Not on file    Active member of club or organization: Not on file    Attends meetings of clubs or organizations: Not on file    Relationship status: Not on file  . Intimate partner violence:    Fear of current or ex partner: Not on file    Emotionally abused: Not on file    Physically abused: Not on file    Forced sexual activity: Not on file  Other Topics Concern  . Not on file  Social History Narrative  . Not on file     Constitutional: Denies fever, malaise, fatigue, headache or abrupt weight changes.  Cardiovascular: Denies chest pain, chest tightness, palpitations or swelling in the hands or feet.  Gastrointestinal: Pt reports abdominal pain and constipation. Denies bloating, diarrhea or blood in the stool.  GU: Denies urgency, frequency, pain with urination, burning sensation, blood in urine, odor or discharge. Musculoskeletal: Pt reports low back pain. Denies decrease in range of motion, difficulty with gait, muscle pain joint swelling.  Skin: Denies redness, rashes, lesions or ulcercations.    No other specific complaints in a complete review of systems (except as listed in HPI above).  Objective:   Physical Exam   There were no vitals taken for this visit. Wt Readings from Last 3 Encounters:  09/17/18 188 lb (85.3 kg)  08/07/18 187 lb (84.8 kg)  03/01/18 184 lb (83.5 kg)    General: Appears her stated age, obese, in NAD. Skin: Warm, dry and intact. No rashes noted. Cardiovascular: Normal rate and rhythm. S1,S2 noted.  No murmur, rubs or gallops noted.  Pulmonary/Chest: Increased effort and positive vesicular breath sounds. No respiratory distress. No wheezes, rales or ronchi noted.    Abdomen: Soft and tender over the bladder. Hypoactive bowel sounds. No distention or masses noted.No CVA tenderness noted. Musculoskeletal: Decreased flexion and extension of the spine. Normal rotation. Bony tenderness noted over the lumbar spine. Gait slow and steady. Neurological: Alert and oriented.    BMET    Component Value Date/Time   NA 133 (L) 08/07/2018 1649   K 4.7 08/07/2018 1649   CL 97 08/07/2018 1649   CO2 26 08/07/2018 1649   GLUCOSE 96 08/07/2018 1649  BUN 20 08/07/2018 1649   CREATININE 1.08 08/07/2018 1649   CALCIUM 9.5 08/07/2018 1649   GFRNONAA 52 (L) 01/04/2018 0921   GFRAA >60 01/04/2018 0921    Lipid Panel     Component Value Date/Time   CHOL 163 12/07/2017 1113   TRIG 117.0 12/07/2017 1113   HDL 57.60 12/07/2017 1113   CHOLHDL 3 12/07/2017 1113   VLDL 23.4 12/07/2017 1113   LDLCALC 82 12/07/2017 1113    CBC    Component Value Date/Time   WBC 10.2 08/07/2018 1649   RBC 4.75 08/07/2018 1649   HGB 14.6 08/07/2018 1649   HCT 42.5 08/07/2018 1649   PLT 367.0 08/07/2018 1649   MCV 89.5 08/07/2018 1649   MCH 30.5 01/04/2018 0921   MCHC 34.3 08/07/2018 1649   RDW 14.0 08/07/2018 1649    Hgb A1C Lab Results  Component Value Date   HGBA1C 7.6 (H) 08/07/2018           Assessment & Plan:   Abdominal Pain, Bloating, Constipation, Acute Low Back Pain:  Urinalysis: 1+ leuks, positive nitrites Will send urine culture eRx for Keflex 250 mg BID x 5 day Start Colace 100 mg BID If no improvement, decrease Colace to 100 mg at bedtime and start Mirilax 17 gm daily in am  Return precautions discussed Webb Silversmith, NP

## 2018-09-19 NOTE — Patient Instructions (Signed)

## 2018-09-21 LAB — URINE CULTURE
MICRO NUMBER: 91336808
SPECIMEN QUALITY:: ADEQUATE

## 2018-09-22 ENCOUNTER — Other Ambulatory Visit: Payer: Self-pay | Admitting: Internal Medicine

## 2018-09-28 ENCOUNTER — Encounter: Payer: Self-pay | Admitting: Family Medicine

## 2018-09-28 ENCOUNTER — Ambulatory Visit (INDEPENDENT_AMBULATORY_CARE_PROVIDER_SITE_OTHER): Payer: Medicare Other | Admitting: Family Medicine

## 2018-09-28 VITALS — BP 126/82 | HR 91 | Temp 97.6°F | Ht 64.0 in

## 2018-09-28 DIAGNOSIS — R1030 Lower abdominal pain, unspecified: Secondary | ICD-10-CM | POA: Diagnosis not present

## 2018-09-28 DIAGNOSIS — B9620 Unspecified Escherichia coli [E. coli] as the cause of diseases classified elsewhere: Secondary | ICD-10-CM | POA: Insufficient documentation

## 2018-09-28 DIAGNOSIS — N390 Urinary tract infection, site not specified: Secondary | ICD-10-CM | POA: Insufficient documentation

## 2018-09-28 DIAGNOSIS — N3000 Acute cystitis without hematuria: Secondary | ICD-10-CM | POA: Diagnosis not present

## 2018-09-28 LAB — POC URINALSYSI DIPSTICK (AUTOMATED)
Bilirubin, UA: NEGATIVE
Blood, UA: NEGATIVE
Glucose, UA: NEGATIVE
KETONES UA: NEGATIVE
NITRITE UA: POSITIVE
PH UA: 6 (ref 5.0–8.0)
PROTEIN UA: NEGATIVE
Spec Grav, UA: 1.025 (ref 1.010–1.025)
UROBILINOGEN UA: 0.2 U/dL

## 2018-09-28 MED ORDER — SULFAMETHOXAZOLE-TRIMETHOPRIM 800-160 MG PO TABS: 1.0000 | ORAL_TABLET | Freq: Two times a day (BID) | ORAL | 0 refills | Status: DC

## 2018-09-28 NOTE — Assessment & Plan Note (Signed)
Not fully resolved from recent tx -symptomatically  ua still shows leuk/nitrite cx sent tx with bactrim DS bid for 7d Enc water intake Update if not starting to improve in a week or if worsening

## 2018-09-28 NOTE — Patient Instructions (Addendum)
Take the bactrim DS antibiotic as directed  Please drink water as well   Take care of yourself  We will culture your urine and let you know   Update if not starting to improve in a week or if worsening

## 2018-09-28 NOTE — Progress Notes (Signed)
Subjective:    Patient ID: Megan Bradshaw, female    DOB: Mar 08, 1930, 84 y.o.   MRN: [id number]  HPI Here for c/o abd pain in setting of recent uti   84 yo pt of NP [facility]  Seen on 11/6 with pos ua and tx with keflex 250 bid for 5 d  ucx grew e coli  Also inst to start colace for constipation /to add miralax if needed   Today UA is still positive for leuk and nitrite  Results for orders placed or performed in visit on 09/28/18  POCT Urinalysis Dipstick (Automated)  Result Value Ref Range   Color, UA Yellow    Clarity, UA Cloudy    Glucose, UA Negative Negative   Bilirubin, UA Negative    Ketones, UA Negative    Spec Grav, UA 1.025 1.010 - 1.025   Blood, UA Negative    pH, UA 6.0 5.0 - 8.0   Protein, UA Negative Negative   Urobilinogen, UA 0.2 0.2 or 1.0 E.U./dL   Nitrite, UA Positive    Leukocytes, UA Moderate (2+) (A) Negative     The low abdominal pain is improved   Still feels really bloated  Some improvement after defacation  1-2 bm per day (soft but not a lot)   No fever  Sometimes a little nausea  Appetite is down   No burning to urinate  Bladder is uncomfortable    Patient Active Problem List   Diagnosis Date Noted  . UTI (urinary tract infection) 09/28/2018  . Elevated blood pressure reading in office without diagnosis of hypertension 06/07/2018  . Arthritis 12/07/2017  . OAB (overactive bladder) 09/01/2016  . Seasonal allergies 09/01/2016  . 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 Hypothyroid 09/22/2014  . COPD GOLD I 08/12/2014   Past Medical History:  Diagnosis Date  . Arthritis   . Asthma   . COPD (chronic obstructive pulmonary disease) ([place])   . Diabetes ([place])   . Thyroid disease    Past Surgical History:  Procedure Laterality Date  . ABDOMINAL HYSTERECTOMY  1984  . APPENDECTOMY  1939   . CATARACT EXTRACTION, BILATERAL    . CHOLECYSTECTOMY  1987  . NASAL SINUS SURGERY  1990  .  REPLACEMENT TOTAL KNEE Right 2007   Social History   Tobacco Use  . Smoking status: Former Smoker    Packs/day: 1.50    Years: 30.00    Pack years: 45.00    Types: Cigarettes    Last attempt to quit: 11/15/1987    Years since quitting: 30.8  . Smokeless tobacco: Never Used  Substance Use Topics  . Alcohol use: No    Alcohol/week: 0.0 standard drinks  . Drug use: No   Family History  Problem Relation Age of Onset  . Arthritis Father   . Diabetes Sister   . Diabetes Daughter   . Cancer Neg Hx   . Heart disease Neg Hx   . Stroke Neg Hx    No Known Allergies Current Outpatient Medications on File Prior to Visit  Medication Sig Dispense Refill  . albuterol (PROVENTIL) (2.5 MG/3ML) 0.083% nebulizer solution Take 3 mLs (2.5 mg total) by nebulization every 4 (four) hours as needed for wheezing or shortness of breath. 150 mL 5  . albuterol (VENTOLIN HFA) 108 (90 Base) MCG/ACT inhaler inhale 1 to 2 puffs by mouth every 6 hours pain for wheezing or shortness of breath 18 g 1  . Cholecalciferol (VITAMIN D  PO) Take 1 tablet by mouth daily.    . clobetasol cream (TEMOVATE) 6.96 % Apply 1 application topically daily as needed.    . fluticasone furoate-vilanterol (BREO ELLIPTA) 100-25 MCG/INH AEPB Inhale 1 puff into the lungs daily. 60 each 10  . glipiZIDE (GLUCOTROL XL) 10 MG 24 hr tablet Take 1 tablet (10 mg total) by mouth daily with breakfast. 90 tablet 0  . levocetirizine (XYZAL) 5 MG tablet take 1 tablet by mouth once daily as needed for cough and nasal congestion 30 tablet 5  . levothyroxine (SYNTHROID, LEVOTHROID) 75 MCG tablet TAKE 1 TABLET BY MOUTH EVERY DAY BEFORE BREAKFAST 90 tablet 0  . meloxicam (MOBIC) 15 MG tablet Take 1 tablet (15 mg total) by mouth every other day. 15 tablet 1  . metFORMIN (GLUCOPHAGE) 500 MG tablet Take 1 tablet (500 mg total) by mouth 2 (two) times daily with a meal. (Patient taking differently: Take 500 mg by mouth daily with breakfast. ) 180 tablet 1  .  Multiple Vitamin (MULTIVITAMIN) capsule Take 1 capsule by mouth daily.    Marland Kitchen nystatin (MYCOSTATIN/NYSTOP) powder Apply topically 2 (two) times daily. 30 g 0   No current facility-administered medications on file prior to visit.     Review of Systems  Constitutional: Negative for activity change, appetite change, fatigue, fever and unexpected weight change.  HENT: Negative for congestion, ear pain, rhinorrhea, sinus pressure and sore throat.   Eyes: Negative for pain, redness and visual disturbance.  Respiratory: Negative for cough, shortness of breath and wheezing.   Cardiovascular: Negative for chest pain and palpitations.  Gastrointestinal: Positive for abdominal pain, constipation and nausea. Negative for abdominal distention, blood in stool, diarrhea and vomiting.  Endocrine: Negative for polydipsia and polyuria.  Genitourinary: Positive for dysuria and frequency. Negative for urgency.  Musculoskeletal: Negative for arthralgias, back pain and myalgias.  Skin: Negative for pallor and rash.  Allergic/Immunologic: Negative for environmental allergies.  Neurological: Negative for dizziness, syncope and headaches.  Hematological: Negative for adenopathy. Does not bruise/bleed easily.  Psychiatric/Behavioral: Negative for decreased concentration and dysphoric mood. The patient is not nervous/anxious.        Objective:   Physical Exam  Constitutional: She appears well-developed and well-nourished. No distress.  Well appearing   HENT:  Head: Normocephalic and atraumatic.  Eyes: Pupils are equal, round, and reactive to light. Conjunctivae and EOM are normal.  Neck: Normal range of motion. Neck supple.  Cardiovascular: Normal rate, regular rhythm and normal heart sounds.  Pulmonary/Chest: Effort normal and breath sounds normal.  Abdominal: Soft. Bowel sounds are normal. She exhibits no distension. There is tenderness. There is no rebound.  No cva tenderness  Mild suprapubic tenderness    Musculoskeletal: She exhibits no edema.  Lymphadenopathy:    She has no cervical adenopathy.  Neurological: She is alert. No cranial nerve deficit.  Skin: No rash noted.  Psychiatric: She has a normal mood and affect.          Assessment & Plan:   Problem List Items Addressed This Visit      Genitourinary   UTI (urinary tract infection) - Primary    Not fully resolved from recent tx -symptomatically  ua still shows leuk/nitrite cx sent tx with bactrim DS bid for 7d Enc water intake Update if not starting to improve in a week or if worsening          Relevant Medications   sulfamethoxazole-trimethoprim (BACTRIM DS,SEPTRA DS) 800-160 MG tablet   Other Relevant Orders  Urine Culture    Other Visit Diagnoses    Lower abdominal pain       Relevant Orders   POCT Urinalysis Dipstick (Automated) (Completed)

## 2018-09-30 LAB — URINE CULTURE
MICRO NUMBER: 91378983
SPECIMEN QUALITY:: ADEQUATE

## 2018-10-02 ENCOUNTER — Other Ambulatory Visit: Payer: Self-pay | Admitting: Family Medicine

## 2018-10-02 MED ORDER — CIPROFLOXACIN HCL 250 MG PO TABS: 250.0000 mg | ORAL_TABLET | Freq: Two times a day (BID) | ORAL | 0 refills | Status: DC

## 2018-10-02 NOTE — Telephone Encounter (Signed)
Pt notified Rx sent to pharmacy and pt will drop off a urine sample once done with abx

## 2018-10-02 NOTE — Telephone Encounter (Signed)
Options for treatment are limited   We can tx with cipro   I pended to send to pref pharmacy   Once done with abx will need another urine culture Alert [place] if no clinical improvement

## 2018-10-02 NOTE — Telephone Encounter (Signed)
-----   Message from Tammi Sou, [place] sent at 10/02/2018  9:56 AM EST ----- Pt called back I advise her of urine cx results. Pt said that she isn't feeling much better, she overall still feels bad and also she is still having bad urinary retention. Pt said if you have to change her abx she doesn't want levaquin. Pt uses [facility] [address]

## 2018-10-10 ENCOUNTER — Other Ambulatory Visit: Payer: Medicare Other

## 2018-10-10 DIAGNOSIS — R300 Dysuria: Secondary | ICD-10-CM | POA: Diagnosis not present

## 2018-10-10 DIAGNOSIS — Z87440 Personal history of urinary (tract) infections: Secondary | ICD-10-CM

## 2018-10-11 LAB — URINE CULTURE
MICRO NUMBER:: 91431189
SPECIMEN QUALITY:: ADEQUATE

## 2018-10-15 ENCOUNTER — Other Ambulatory Visit: Payer: Self-pay | Admitting: Internal Medicine

## 2018-11-27 DIAGNOSIS — H40003 Preglaucoma, unspecified, bilateral: Secondary | ICD-10-CM | POA: Diagnosis not present

## 2018-11-27 DIAGNOSIS — H353131 Nonexudative age-related macular degeneration, bilateral, early dry stage: Secondary | ICD-10-CM | POA: Diagnosis not present

## 2018-11-27 DIAGNOSIS — E113293 Type 2 diabetes mellitus with mild nonproliferative diabetic retinopathy without macular edema, bilateral: Secondary | ICD-10-CM | POA: Diagnosis not present

## 2018-11-27 LAB — HM DIABETES EYE EXAM

## 2018-11-28 ENCOUNTER — Other Ambulatory Visit: Payer: Self-pay

## 2018-11-28 ENCOUNTER — Telehealth: Payer: Self-pay | Admitting: Pulmonary Disease

## 2018-11-28 MED ORDER — FLUTICASONE FUROATE-VILANTEROL 100-25 MCG/INH IN AEPB: 1.0000 | INHALATION_SPRAY | Freq: Every day | RESPIRATORY_TRACT | 10 refills | Status: DC

## 2018-11-28 NOTE — Telephone Encounter (Signed)
Spoke to patient, we had sent RX to [facility]. Obtained mail order address and sent Breo to [facility] mail order. Patient aware.

## 2018-12-05 ENCOUNTER — Telehealth: Payer: Self-pay | Admitting: Pulmonary Disease

## 2018-12-05 MED ORDER — FLUTICASONE FUROATE-VILANTEROL 100-25 MCG/INH IN AEPB: 1.0000 | INHALATION_SPRAY | Freq: Every day | RESPIRATORY_TRACT | 2 refills | Status: DC

## 2018-12-05 NOTE — Telephone Encounter (Signed)
Notified patient rx Breo sent for 180/2 refills.

## 2018-12-20 ENCOUNTER — Other Ambulatory Visit: Payer: Self-pay | Admitting: Internal Medicine

## 2019-01-10 ENCOUNTER — Ambulatory Visit (INDEPENDENT_AMBULATORY_CARE_PROVIDER_SITE_OTHER): Payer: Medicare Other | Admitting: Internal Medicine

## 2019-01-10 ENCOUNTER — Encounter: Payer: Self-pay | Admitting: Internal Medicine

## 2019-01-10 VITALS — BP 118/78 | HR 87 | Temp 97.4°F | Resp 16

## 2019-01-10 DIAGNOSIS — J441 Chronic obstructive pulmonary disease with (acute) exacerbation: Secondary | ICD-10-CM | POA: Diagnosis not present

## 2019-01-10 DIAGNOSIS — J4541 Moderate persistent asthma with (acute) exacerbation: Secondary | ICD-10-CM | POA: Diagnosis not present

## 2019-01-10 MED ORDER — PREDNISONE 10 MG PO TABS: ORAL_TABLET | ORAL | 0 refills | Status: DC

## 2019-01-10 NOTE — Progress Notes (Deleted)
Acute Office Visit  Subjective:    Patient ID: Megan Bradshaw, female    DOB: 05-Mar-1930, 85 y.o.   MRN: [id number]  Chief Complaint  Patient presents with  . Nasal Congestion  . Chest congestion    with some cough and shortness of breath.     HPI Patient is in today for ***  Past Medical History:  Diagnosis Date  . Arthritis   . Asthma   . COPD (chronic obstructive pulmonary disease) ([place])   . Diabetes ([place])   . Thyroid disease     Past Surgical History:  Procedure Laterality Date  . ABDOMINAL HYSTERECTOMY  1984  . APPENDECTOMY  1939   . CATARACT EXTRACTION, BILATERAL    . CHOLECYSTECTOMY  1987  . NASAL SINUS SURGERY  1990  . REPLACEMENT TOTAL KNEE Right 2007    Family History  Problem Relation Age of Onset  . Arthritis Father   . Diabetes Sister   . Diabetes Daughter   . Cancer Neg Hx   . Heart disease Neg Hx   . Stroke Neg Hx     Social History   Socioeconomic History  . Marital status: Widowed    Spouse name: Not on file  . Number of children: Not on file  . Years of education: Not on file  . Highest education level: Not on file  Occupational History  . Occupation: Retired  Scientific laboratory technician  . Financial resource strain: Not on file  . Food insecurity:    Worry: Not on file    Inability: Not on file  . Transportation needs:    Medical: Not on file    Non-medical: Not on file  Tobacco Use  . Smoking status: Former Smoker    Packs/day: 1.50    Years: 30.00    Pack years: 45.00    Types: Cigarettes    Last attempt to quit: 11/15/1987    Years since quitting: 31.1  . Smokeless tobacco: Never Used  Substance and Sexual Activity  . Alcohol use: No    Alcohol/week: 0.0 standard drinks  . Drug use: No  . Sexual activity: Not on file  Lifestyle  . Physical activity:    Days per week: Not on file    Minutes per session: Not on file  . Stress: Not on file  Relationships  . Social connections:    Talks on phone: Not on file    Gets together:  Not on file    Attends religious service: Not on file    Active member of club or organization: Not on file    Attends meetings of clubs or organizations: Not on file    Relationship status: Not on file  . Intimate partner violence:    Fear of current or ex partner: Not on file    Emotionally abused: Not on file    Physically abused: Not on file    Forced sexual activity: Not on file  Other Topics Concern  . Not on file  Social History Narrative  . Not on file    Outpatient Medications Prior to Visit  Medication Sig Dispense Refill  . albuterol (PROVENTIL) (2.5 MG/3ML) 0.083% nebulizer solution Take 3 mLs (2.5 mg total) by nebulization every 4 (four) hours as needed for wheezing or shortness of breath. 150 mL 5  . albuterol (VENTOLIN HFA) 108 (90 Base) MCG/ACT inhaler inhale 1 to 2 puffs by mouth every 6 hours pain for wheezing or shortness of breath 18 g 1  .  Cholecalciferol (VITAMIN D PO) Take 1 tablet by mouth daily.    . clobetasol cream (TEMOVATE) 3.29 % Apply 1 application topically daily as needed.    . fluticasone furoate-vilanterol (BREO ELLIPTA) 100-25 MCG/INH AEPB Inhale 1 puff into the lungs daily. 180 each 2  . glipiZIDE (GLUCOTROL XL) 10 MG 24 hr tablet TAKE 1 TABLET(10 MG) BY MOUTH DAILY WITH BREAKFAST 90 tablet 0  . levocetirizine (XYZAL) 5 MG tablet take 1 tablet by mouth once daily as needed for cough and nasal congestion 30 tablet 5  . levothyroxine (SYNTHROID, LEVOTHROID) 75 MCG tablet TAKE 1 TABLET BY MOUTH EVERY DAY BEFORE BREAKFAST 90 tablet 0  . meloxicam (MOBIC) 15 MG tablet Take 1 tablet (15 mg total) by mouth every other day. 15 tablet 1  . metFORMIN (GLUCOPHAGE) 500 MG tablet Take 1 tablet (500 mg total) by mouth 2 (two) times daily with a meal. (Patient taking differently: Take 500 mg by mouth daily with breakfast. ) 180 tablet 1  . Multiple Vitamin (MULTIVITAMIN) capsule Take 1 capsule by mouth daily.    Marland Kitchen nystatin (MYCOSTATIN/NYSTOP) powder Apply topically  2 (two) times daily. 30 g 0  . ciprofloxacin (CIPRO) 250 MG tablet Take 1 tablet (250 mg total) by mouth 2 (two) times daily. 10 tablet 0   No facility-administered medications prior to visit.     No Known Allergies  ROS     Objective:    Physical Exam  BP 118/78 (BP Location: Right Arm, Patient Position: Sitting, Cuff Size: Large)   Pulse 87   Temp (!) 97.4 F (36.3 C) (Oral)   Resp 16   SpO2 98%  Wt Readings from Last 3 Encounters:  09/17/18 188 lb (85.3 kg)  08/07/18 187 lb (84.8 kg)  03/01/18 184 lb (83.5 kg)    Health Maintenance Due  Topic Date Due  . TETANUS/TDAP  07/01/1951  . DEXA SCAN  06/30/1997  . PNA vac Low Risk Adult (2 of 2 - PPSV23) 08/19/2016  . FOOT EXAM  09/01/2017  . URINE MICROALBUMIN  12/07/2018    There are no preventive care reminders to display for this patient.   Lab Results  Component Value Date   TSH 3.64 08/07/2018   Lab Results  Component Value Date   WBC 10.2 08/07/2018   HGB 14.6 08/07/2018   HCT 42.5 08/07/2018   MCV 89.5 08/07/2018   PLT 367.0 08/07/2018   Lab Results  Component Value Date   NA 133 (L) 08/07/2018   K 4.7 08/07/2018   CO2 26 08/07/2018   GLUCOSE 96 08/07/2018   BUN 20 08/07/2018   CREATININE 1.08 08/07/2018   BILITOT 0.4 08/07/2018   ALKPHOS 63 08/07/2018   AST 20 08/07/2018   ALT 16 08/07/2018   PROT 7.6 08/07/2018   ALBUMIN 4.3 08/07/2018   CALCIUM 9.5 08/07/2018   ANIONGAP 9 01/04/2018   GFR 51.11 (L) 08/07/2018   Lab Results  Component Value Date   CHOL 163 12/07/2017   Lab Results  Component Value Date   HDL 57.60 12/07/2017   Lab Results  Component Value Date   LDLCALC 82 12/07/2017   Lab Results  Component Value Date   TRIG 117.0 12/07/2017   Lab Results  Component Value Date   CHOLHDL 3 12/07/2017   Lab Results  Component Value Date   HGBA1C 7.6 (H) 08/07/2018       Assessment & Plan:   Problem List Items Addressed This Visit    None  No orders of the  defined types were placed in this encounter.    Webb Silversmith, NP

## 2019-01-13 ENCOUNTER — Encounter: Payer: Self-pay | Admitting: Internal Medicine

## 2019-01-13 NOTE — Progress Notes (Signed)
Patient ID: Megan Bradshaw, female   DOB: [birth year], 85 y.o.   MRN: [id number] HPI  Pt presents to the clinic today with c/o nasal congestion, cough and shortness of breath. She reports this started 3 weeks ago. She is not able to blow anything out of her nose. The cough is non productive. She is short of breath even at rest. She denies ear pain or sore throat. She denies fever ,chills or body aches. She has tried Xyzal, Breo and Albuterol with minimal relief. She has not been feeling well since her pulmonologist took her off Prednisone. She has a history of asthma or COPD. She has not had sick contacts.  Review of Systems      Past Medical History:  Diagnosis Date  . Arthritis   . Asthma   . COPD (chronic obstructive pulmonary disease) ([place])   . Diabetes ([place])   . Thyroid disease     Family History  Problem Relation Age of Onset  . Arthritis Father   . Diabetes Sister   . Diabetes Daughter   . Cancer Neg Hx   . Heart disease Neg Hx   . Stroke Neg Hx     Social History   Socioeconomic History  . Marital status: Widowed    Spouse name: Not on file  . Number of children: Not on file  . Years of education: Not on file  . Highest education level: Not on file  Occupational History  . Occupation: Retired  Scientific laboratory technician  . Financial resource strain: Not on file  . Food insecurity:    Worry: Not on file    Inability: Not on file  . Transportation needs:    Medical: Not on file    Non-medical: Not on file  Tobacco Use  . Smoking status: Former Smoker    Packs/day: 1.50    Years: 30.00    Pack years: 45.00    Types: Cigarettes    Last attempt to quit: 11/15/1987    Years since quitting: 31.1  . Smokeless tobacco: Never Used  Substance and Sexual Activity  . Alcohol use: No    Alcohol/week: 0.0 standard drinks  . Drug use: No  . Sexual activity: Not on file  Lifestyle  . Physical activity:    Days per week: Not on file    Minutes per session: Not on file  . Stress:  Not on file  Relationships  . Social connections:    Talks on phone: Not on file    Gets together: Not on file    Attends religious service: Not on file    Active member of club or organization: Not on file    Attends meetings of clubs or organizations: Not on file    Relationship status: Not on file  . Intimate partner violence:    Fear of current or ex partner: Not on file    Emotionally abused: Not on file    Physically abused: Not on file    Forced sexual activity: Not on file  Other Topics Concern  . Not on file  Social History Narrative  . Not on file    No Known Allergies   Constitutional: Positive fatigue. Denies headache, fever or abrupt weight changes.  HEENT:  Positive nasal congestion. Denies eye redness, eye pain, pressure behind the eyes, facial pain, ear pain, ringing in the ears, wax buildup, runny nose or sore throat. Respiratory: Positive cough and shortness of breath. Denies difficulty breathing.  Cardiovascular: Denies chest pain, chest  tightness, palpitations or swelling in the hands or feet.   No other specific complaints in a complete review of systems (except as listed in HPI above).  Objective:   BP 118/78 (BP Location: Right Arm, Patient Position: Sitting, Cuff Size: Large)   Pulse 87   Temp (!) 97.4 F (36.3 C) (Oral)   Resp 16   SpO2 98%  Wt Readings from Last 3 Encounters:  09/17/18 188 lb (85.3 kg)  08/07/18 187 lb (84.8 kg)  03/01/18 184 lb (83.5 kg)     General: Appears her stated age, well developed, well nourished in NAD. HEENT: Head: normal shape and size, no sinus tenderness noted; Ears: Tm's gray and intact, normal light reflex; Nose: mucosa pink and moist, septum midline; Throat/Mouth: Teeth present, mucosa and moist, no exudate noted, no lesions or ulcerations noted.  Neck: No cervical lymphadenopathy.  Cardiovascular: Normal rate and rhythm.  Pulmonary/Chest: Normal effort and positive vesicular breath sounds with bilateral  expiratory wheezing. No respiratory distress. No wheezes, rales or ronchi noted.       Assessment & Plan:   Asthma/COPD Exacerbation:  Get some rest and drink plenty of water Continue Xyzal RX for Pred Taper x 6 days RX for nebulizer machine Delsym OTC as needed for cough  RTC as needed or if symptoms persist.   Webb Silversmith, NP

## 2019-01-13 NOTE — Patient Instructions (Signed)

## 2019-01-20 ENCOUNTER — Other Ambulatory Visit: Payer: Self-pay | Admitting: Internal Medicine

## 2019-01-23 ENCOUNTER — Ambulatory Visit: Payer: Medicare Other | Admitting: Internal Medicine

## 2019-01-29 ENCOUNTER — Telehealth: Payer: Self-pay | Admitting: Pulmonary Disease

## 2019-01-29 DIAGNOSIS — J441 Chronic obstructive pulmonary disease with (acute) exacerbation: Secondary | ICD-10-CM

## 2019-01-29 NOTE — Telephone Encounter (Signed)
Patient calls questioning whether she needs to come in for her apt tomorrow. She states that when she was on the predinisone, she was "kept in check", but since she has stopped she has been achy and has had trouble with her joints. Would like to start 5 mg prednisone back again, since "at her age, she should be able to enjoy her days in less pain."   Patient also states she needs more albuterol and a new small nebulizer. Let patient know that she got her last one in 2017, so insurance will not cover a new one. She is aware. Wants RX's sent to [facility].

## 2019-01-30 ENCOUNTER — Ambulatory Visit: Payer: Medicare Other | Admitting: Pulmonary Disease

## 2019-01-30 ENCOUNTER — Telehealth: Payer: Self-pay | Admitting: Internal Medicine

## 2019-01-30 MED ORDER — ALBUTEROL SULFATE (2.5 MG/3ML) 0.083% IN NEBU: 2.5000 mg | INHALATION_SOLUTION | RESPIRATORY_TRACT | 5 refills | Status: DC | PRN

## 2019-01-30 NOTE — Telephone Encounter (Signed)
Prednisone for arthritis symptoms should be prescribed by her primary care MD Please place order for nebulizer and albuterol as she requests  Thanks  Waunita Schooner

## 2019-01-30 NOTE — Telephone Encounter (Signed)
Called and spoke with patient. Let her know about her PCP needs to order the prednisone, patient is stating that her PCP states Simonds needs to order it since he originally ordered it. She understands but is frustrated.  Sent in order for albuterol.   She is going to call her PCP.

## 2019-01-30 NOTE — Telephone Encounter (Signed)
Pt f/u on call from yesterday CB# [phone]//kob

## 2019-01-30 NOTE — Telephone Encounter (Signed)
Pt left v/m requesting that PCP fill prednisone; pt was advised to contact PCP by pulmonologist.Please advise.

## 2019-01-30 NOTE — Telephone Encounter (Signed)
Spoke to patient, will cancel her apt for today and will call her back regarding the albuterol and prednisone. Patient aware.

## 2019-01-30 NOTE — Addendum Note (Signed)
Addended by: Darreld Mclean on: 01/30/2019 12:08 PM   Modules accepted: Orders

## 2019-01-30 NOTE — Telephone Encounter (Signed)
Pt stated her pulmonary doctor told her to call her PCP to prescribe her some prednisone for osteo and respiratory. Please call to advise

## 2019-01-31 MED ORDER — PREDNISONE 5 MG PO TABS: 5.0000 mg | ORAL_TABLET | Freq: Every day | ORAL | 1 refills | Status: DC

## 2019-01-31 NOTE — Telephone Encounter (Signed)
Prednisone sent to pharmacy. She should not take NSAID's with Prednisone.

## 2019-01-31 NOTE — Addendum Note (Signed)
Addended by: Jearld Fenton on: 01/31/2019 08:55 AM   Modules accepted: Orders

## 2019-02-26 ENCOUNTER — Other Ambulatory Visit: Payer: Self-pay | Admitting: Pulmonary Disease

## 2019-02-26 ENCOUNTER — Other Ambulatory Visit: Payer: Self-pay | Admitting: Internal Medicine

## 2019-03-24 ENCOUNTER — Other Ambulatory Visit: Payer: Self-pay | Admitting: Internal Medicine

## 2019-04-15 ENCOUNTER — Telehealth: Payer: Self-pay | Admitting: Pulmonary Disease

## 2019-04-15 NOTE — Telephone Encounter (Signed)
Pt aware, nothing further needed.  ?

## 2019-04-15 NOTE — Telephone Encounter (Signed)
Called patient for COVID-19 pre-screening for in office visit.  Have you recently traveled any where out of the local area in the last 2 weeks? NO  Have you been in close contact with a person diagnosed with COVID-19 within the last 2 weeks? NO  Do you currently have any of the following symptoms? If so, when did they start? Cough (YES-"Months")  Diarrhea    Joint Pain Fever      Muscle Pain  Red eyes Shortness of breath (YES)  Abdominal pain   Vomiting Loss of smell    Rash   Sore Throat Headache    Weakness  Bruising or bleeding  Sneezing, Congestion for "Months"  Pt was scheduled for cough and chest pain originally, but pt denies to having any more chest pain but still has cough along with other symptoms, said to have had these for months.   Okay to proceed with visit. (date)  / Needs to reschedule visit. (date)

## 2019-04-15 NOTE — Telephone Encounter (Signed)
Okay to proceed.  

## 2019-04-16 ENCOUNTER — Other Ambulatory Visit: Payer: Self-pay

## 2019-04-16 ENCOUNTER — Ambulatory Visit (INDEPENDENT_AMBULATORY_CARE_PROVIDER_SITE_OTHER): Payer: Medicare Other | Admitting: Pulmonary Disease

## 2019-04-16 ENCOUNTER — Encounter: Payer: Self-pay | Admitting: Pulmonary Disease

## 2019-04-16 VITALS — BP 146/90 | HR 82 | Ht 64.0 in | Wt 189.6 lb

## 2019-04-16 DIAGNOSIS — R05 Cough: Secondary | ICD-10-CM

## 2019-04-16 DIAGNOSIS — J310 Chronic rhinitis: Secondary | ICD-10-CM

## 2019-04-16 DIAGNOSIS — R0982 Postnasal drip: Secondary | ICD-10-CM | POA: Diagnosis not present

## 2019-04-16 DIAGNOSIS — J449 Chronic obstructive pulmonary disease, unspecified: Secondary | ICD-10-CM | POA: Diagnosis not present

## 2019-04-16 DIAGNOSIS — R053 Chronic cough: Secondary | ICD-10-CM

## 2019-04-16 MED ORDER — FLUTICASONE PROPIONATE 50 MCG/ACT NA SUSP: 1.0000 | Freq: Every day | NASAL | 10 refills | Status: DC

## 2019-04-16 MED ORDER — DEXTROMETHORPHAN POLISTIREX ER 30 MG/5ML PO SUER: 15.0000 mg | ORAL | 12 refills | Status: DC | PRN

## 2019-04-16 NOTE — Patient Instructions (Signed)
Continue Breo inhaler, 1 inhalation daily.  Rinse mouth after use Continue albuterol (inhaler or nebulizer) as needed for increased shortness of breath, wheezing, chest tightness, cough Initiate Flonase nasal inhaler, 1-2 sprays per nostril daily Prescription entered for Delsym as a cough suppressant.  15-30 cc up to every 6 hours as needed  Follow-up in 6 months.  Call sooner if needed

## 2019-04-21 ENCOUNTER — Other Ambulatory Visit: Payer: Self-pay | Admitting: Internal Medicine

## 2019-04-21 NOTE — Progress Notes (Signed)
Pt profile: 85 y.o. F moved from [place]. Initially seen by Dr Melvyn Novas. Diagnosed with COPD in 2011. Graded as Gold I by Dr Melvyn Novas. Chronic prednisone therapy  PROBLEMS: Gold I COPD with chronic asthmatic bronchitis Chronic refractory cough  DATA: 10/22/14 PFTs: mild obstruction, FEV1 1.36 L (81%), FEV1/FVC 53%, TLC normal, DLCO 51% pred 02/16/16 PFTs: FVC: 2.23 L (90 %pred), FEV1: 1.30 L (71 %pred), FEV1/FVC: 58% , TLC: invalid, DLCO 78% pred  INTERVAL: Last seen in this office 09/17/18.  No major pulmonary events  Subj: This is a scheduled office follow-up.  Overall she has done well. She reports no change in respiratory status. She continues to have moderate intermediate cough which is NP. She also reports posterior nasal drainage. She remains on Breo daily and uses albuterol nebs infrequently. She is not using Flonase. Denies CP, fever, purulent sputum, hemoptysis, LE edema and calf tenderness.   Obj: Vitals:   04/16/19 0927 04/16/19 0929  BP:  (!) 146/90  Pulse:  82  SpO2:  97%  Weight: 189 lb 9.6 oz (86 kg)   Height: 5\' 4"  (1.626 m)   RA  Gen: NAD HEENT: NCAT, sclerae white Neck: No JVD Lungs: breath sounds slightly coarse, no wheezes or other adventitious sounds Cardiovascular: RRR, no murmurs Abdomen: Soft, nontender, normal BS Ext: without clubbing, cyanosis, edema Neuro: grossly intact Skin: Limited exam, no lesions noted    BMET    Component Value Date/Time   NA 133 (L) 08/07/2018 1649   K 4.7 08/07/2018 1649   CL 97 08/07/2018 1649   CO2 26 08/07/2018 1649   GLUCOSE 96 08/07/2018 1649   BUN 20 08/07/2018 1649   CREATININE 1.08 08/07/2018 1649   CALCIUM 9.5 08/07/2018 1649   GFRNONAA 52 (L) 01/04/2018 0921   GFRAA >60 01/04/2018 0921    CBC    Component Value Date/Time   WBC 10.2 08/07/2018 1649   RBC 4.75 08/07/2018 1649   HGB 14.6 08/07/2018 1649   HCT 42.5 08/07/2018 1649   PLT 367.0 08/07/2018 1649   MCV 89.5 08/07/2018 1649   MCH 30.5 01/04/2018  0921   MCHC 34.3 08/07/2018 1649   RDW 14.0 08/07/2018 1649    CXR: NNF   IMPRESSION: COPD, mild ([facility])  Chronic cough  Post-nasal drainage  Chronic rhinitis   PLAN/RECS:  Continue Breo inhaler, 1 inhalation daily.  Rinse mouth after use Continue albuterol (inhaler or nebulizer) as needed for increased shortness of breath, wheezing, chest tightness, cough Initiate Flonase nasal inhaler, 1-2 sprays per nostril daily Prescription entered for Delsym as a cough suppressant.  15-30 cc up to every 6 hours as needed  Follow-up in 6 months.  Call sooner if needed  Merton Border, MD PCCM service Mobile [phone] Pager [phone] 04/21/2019 1:26 PM

## 2019-04-30 DIAGNOSIS — Z1283 Encounter for screening for malignant neoplasm of skin: Secondary | ICD-10-CM | POA: Diagnosis not present

## 2019-04-30 DIAGNOSIS — L601 Onycholysis: Secondary | ICD-10-CM | POA: Diagnosis not present

## 2019-04-30 DIAGNOSIS — D1800 Hemangioma unspecified site: Secondary | ICD-10-CM | POA: Diagnosis not present

## 2019-04-30 DIAGNOSIS — L812 Freckles: Secondary | ICD-10-CM | POA: Diagnosis not present

## 2019-04-30 DIAGNOSIS — L821 Other seborrheic keratosis: Secondary | ICD-10-CM | POA: Diagnosis not present

## 2019-05-13 ENCOUNTER — Telehealth: Payer: Self-pay

## 2019-05-13 ENCOUNTER — Telehealth: Payer: Self-pay | Admitting: Pulmonary Disease

## 2019-05-13 NOTE — Telephone Encounter (Signed)
Pt called with SOB,dry cough, and heavy chest. Pt said she does not have any covid symptoms; I advised that SOB and dry cough are potential covid symptoms but pt said it is not covid but COPD; I advised pt she could be correct but with our guidelines I cannot bring pt into the office. Pt said she should not have been honest and then she could have come into office. I apologized to pt but I did appreciate the fact she was honest and suggest that pt go to UC,ED or contact the pulmonologist who pt had appt with on 04/16/19. Pt voiced understanding and will contact pulmonology. FYI to Avie Echevaria NP.

## 2019-05-13 NOTE — Telephone Encounter (Signed)
Okay to proceed, she has a chronic cough.

## 2019-05-13 NOTE — Telephone Encounter (Signed)
Noted, agree with advice given 

## 2019-05-13 NOTE — Telephone Encounter (Signed)
Called patient for COVID-19 pre-screening for in office visit.  Have you recently traveled any where out of the local area in the last 2 weeks? No  Have you been in close contact with a person diagnosed with COVID-19 or someone awaiting results within the last 2 weeks? No  Do you currently have any of the following symptoms? If so, when did they start? Cough (YES)     Diarrhea   Joint Pain Fever      Muscle Pain   Red eyes Shortness of breath   Abdominal pain  Vomiting Loss of smell    Rash    Sore Throat Headache    Weakness   Bruising or bleeding

## 2019-05-14 ENCOUNTER — Ambulatory Visit (INDEPENDENT_AMBULATORY_CARE_PROVIDER_SITE_OTHER): Payer: Medicare Other | Admitting: Pulmonary Disease

## 2019-05-14 ENCOUNTER — Other Ambulatory Visit: Payer: Self-pay

## 2019-05-14 ENCOUNTER — Encounter: Payer: Self-pay | Admitting: Pulmonary Disease

## 2019-05-14 VITALS — BP 140/90 | HR 86 | Temp 97.4°F | Ht 64.0 in | Wt 189.0 lb

## 2019-05-14 DIAGNOSIS — R053 Chronic cough: Secondary | ICD-10-CM

## 2019-05-14 DIAGNOSIS — R0982 Postnasal drip: Secondary | ICD-10-CM | POA: Diagnosis not present

## 2019-05-14 DIAGNOSIS — J310 Chronic rhinitis: Secondary | ICD-10-CM

## 2019-05-14 DIAGNOSIS — J449 Chronic obstructive pulmonary disease, unspecified: Secondary | ICD-10-CM | POA: Diagnosis not present

## 2019-05-14 DIAGNOSIS — R05 Cough: Secondary | ICD-10-CM

## 2019-05-14 MED ORDER — PREDNISONE 10 MG (21) PO TBPK: ORAL_TABLET | ORAL | 0 refills | Status: DC

## 2019-05-14 NOTE — Patient Instructions (Addendum)
Continue Breo inhaler, 1 inhalation daily.  Rinse mouth after use Continue albuterol (inhaler or nebulizer) as needed for increased shortness of breath, wheezing, chest tightness, cough Continue Flonase nasal inhaler, 1-2 sprays per nostril daily Prednisone Dosepak ordered: 6 pills on first day, 5 pills second day, 4 pills third day, etc. until gone  Follow-up 6 months.  Call sooner if needed

## 2019-05-14 NOTE — Progress Notes (Signed)
Pt profile: 85 y.o. F moved from [place]. Initially seen by Dr Melvyn Novas. Diagnosed with COPD in 2011. Graded as Gold I by Dr Melvyn Novas. Chronic prednisone therapy  PROBLEMS: Gold I COPD with chronic asthmatic bronchitis Chronic refractory cough  DATA: 10/22/14 PFTs: mild obstruction, FEV1 1.36 L (81%), FEV1/FVC 53%, TLC normal, DLCO 51% pred 02/16/16 PFTs: FVC: 2.23 L (90 %pred), FEV1: 1.30 L (71 %pred), FEV1/FVC: 58% , TLC: invalid, DLCO 78% pred  INTERVAL: Last seen in this office 04/16/19.  No major pulmonary events  Subj: She requested this office visit as an evaluation before she travels to [place].  Last visit, I prescribed Flonase inhaler which she believes has helped nasal symptoms of congestion and rhinorrhea.  She is now walking regularly for exercise and believes that her exercise tolerance is improving.  She reports persistent symptoms of shortness of breath in the morning upon awakening.  He is using albuterol 1-2 times per day.  She is using Breo inhaler compliantly and knows to rinse her mouth after use.  She remains on daily prednisone at 5 mg/day for nonpulmonary reasons.  She strongly wishes to have a course of prednisone prior to her travels to [place].   Obj: Vitals:   05/14/19 1143 05/14/19 1148  BP:  140/90  Pulse:  86  Temp:  (!) 97.4 F (36.3 C)  TempSrc:  Temporal  SpO2:  96%  Weight: 189 lb (85.7 kg)   Height: 5\' 4"  (1.626 m)   RA  Gen: NAD HEENT: NCAT, sclerae white Neck: No JVD Lungs: breath sounds full, no wheezes or other adventitious sounds Cardiovascular: RRR, no murmurs Abdomen: Soft, nontender, normal BS Ext: without clubbing, cyanosis, edema Neuro: grossly intact Skin: Limited exam, no lesions noted     BMET    Component Value Date/Time   NA 133 (L) 08/07/2018 1649   K 4.7 08/07/2018 1649   CL 97 08/07/2018 1649   CO2 26 08/07/2018 1649   GLUCOSE 96 08/07/2018 1649   BUN 20 08/07/2018 1649   CREATININE 1.08 08/07/2018 1649   CALCIUM 9.5  08/07/2018 1649   GFRNONAA 52 (L) 01/04/2018 0921   GFRAA >60 01/04/2018 0921    CBC    Component Value Date/Time   WBC 10.2 08/07/2018 1649   RBC 4.75 08/07/2018 1649   HGB 14.6 08/07/2018 1649   HCT 42.5 08/07/2018 1649   PLT 367.0 08/07/2018 1649   MCV 89.5 08/07/2018 1649   MCH 30.5 01/04/2018 0921   MCHC 34.3 08/07/2018 1649   RDW 14.0 08/07/2018 1649    CXR: NNF   IMPRESSION:   ICD-10-CM   1. COPD, mild ([place])  J44.9   2. Chronic cough  R05   3. Chronic rhinitis  J31.0   4. Post-nasal drainage  R09.82     PLAN/RECS:  Continue Breo inhaler, 1 inhalation daily.  Rinse mouth after use Continue albuterol (inhaler or nebulizer) as needed for increased shortness of breath, wheezing, chest tightness, cough Continue Flonase nasal inhaler, 1-2 sprays per nostril daily Prednisone Dosepak ordered: 6 pills on first day, 5 pills second day, 4 pills third day, etc. until gone  Follow-up 6 months.  Call sooner if needed   Merton Border, MD PCCM service Mobile [phone] Pager [phone] 05/16/2019 10:01 AM

## 2019-06-14 ENCOUNTER — Other Ambulatory Visit: Payer: Self-pay

## 2019-06-23 ENCOUNTER — Other Ambulatory Visit: Payer: Self-pay | Admitting: Internal Medicine

## 2019-07-09 ENCOUNTER — Other Ambulatory Visit: Payer: Self-pay | Admitting: Internal Medicine

## 2019-08-12 ENCOUNTER — Other Ambulatory Visit: Payer: Self-pay | Admitting: Internal Medicine

## 2019-08-12 ENCOUNTER — Other Ambulatory Visit: Payer: Self-pay

## 2019-08-12 ENCOUNTER — Encounter: Payer: Self-pay | Admitting: Internal Medicine

## 2019-08-12 ENCOUNTER — Ambulatory Visit (INDEPENDENT_AMBULATORY_CARE_PROVIDER_SITE_OTHER): Payer: Medicare Other | Admitting: Internal Medicine

## 2019-08-12 VITALS — BP 128/84 | HR 86 | Temp 98.2°F | Wt 191.0 lb

## 2019-08-12 DIAGNOSIS — T380X5A Adverse effect of glucocorticoids and synthetic analogues, initial encounter: Secondary | ICD-10-CM | POA: Diagnosis not present

## 2019-08-12 DIAGNOSIS — M1990 Unspecified osteoarthritis, unspecified site: Secondary | ICD-10-CM | POA: Diagnosis not present

## 2019-08-12 DIAGNOSIS — J449 Chronic obstructive pulmonary disease, unspecified: Secondary | ICD-10-CM

## 2019-08-12 DIAGNOSIS — E099 Drug or chemical induced diabetes mellitus without complications: Secondary | ICD-10-CM

## 2019-08-12 DIAGNOSIS — N3281 Overactive bladder: Secondary | ICD-10-CM | POA: Diagnosis not present

## 2019-08-12 DIAGNOSIS — E039 Hypothyroidism, unspecified: Secondary | ICD-10-CM | POA: Diagnosis not present

## 2019-08-12 MED ORDER — PREDNISONE 10 MG PO TABS: ORAL_TABLET | ORAL | 0 refills | Status: DC

## 2019-08-12 NOTE — Progress Notes (Signed)
Subjective:    Patient ID: Megan Bradshaw, female    DOB: [birth year], 85 y.o.   MRN: [id number]  HPI  Pt presents to the clinic today for follow up of chronic conditions.  Arthritis: Mainly in her back and knees. She takes not taking Meloxicam as prescribed because she is on Prednisone daily.  Asthma/COPD: Debilitating. Worse in the last 2-3 weeks. She reports increased cough productive of light yellow mucous and worsening shortness of breath. She is using Breo and Prednisone as prescribed. She uses an Albuterol inhaler as needed. She follows with Dr. Alva Garnet.  Steroid Induced Diabetes: Her last A1C was 7.6%, 07/2018. She does not monitor her sugars. She is taking Metformin and Glipizide as prescribed. She checks her feet routinely. Her last eye exam was 7-8 months ago. She has not had her flu shot this year.  Hypothyroidism: She denies any issues on her current dose of Levothyroxine. She is due for repeat labs.  OAB: Mainly urinary frequency and nocturia. She is not taking any OAB medication at this time. She does not follow with urology.  Review of Systems      Past Medical History:  Diagnosis Date  . Arthritis   . Asthma   . COPD (chronic obstructive pulmonary disease) ([place])   . Diabetes ([place])   . Thyroid disease     Current Outpatient Medications  Medication Sig Dispense Refill  . albuterol (PROVENTIL) (2.5 MG/3ML) 0.083% nebulizer solution Take 3 mLs (2.5 mg total) by nebulization every 4 (four) hours as needed for wheezing or shortness of breath. 150 mL 5  . albuterol (VENTOLIN HFA) 108 (90 Base) MCG/ACT inhaler INHALE 1 TO 2 PUFFS BY MOUTH EVERY 6 HOURS FOR PAIN OR WHEEZING OR SHORTNESS OF BREATH 18 g 1  . Cholecalciferol (VITAMIN D PO) Take 1 tablet by mouth daily.    . clobetasol cream (TEMOVATE) [id number] % Apply 1 application topically daily as needed.    Marland Kitchen dextromethorphan (DELSYM) 30 MG/5ML liquid Take 2.5-5 mLs (15-30 mg total) by mouth as needed for cough. 150 mL 12   . fluticasone (FLONASE) 50 MCG/ACT nasal spray Place 1 spray into both nostrils daily. 16 g 10  . fluticasone furoate-vilanterol (BREO ELLIPTA) 100-25 MCG/INH AEPB Inhale 1 puff into the lungs daily. 180 each 2  . glipiZIDE (GLUCOTROL XL) 10 MG 24 hr tablet TAKE 1 TABLET(10 MG) BY MOUTH DAILY WITH BREAKFAST 90 tablet 0  . levocetirizine (XYZAL) 5 MG tablet TAKE 1 TABLET BY MOUTH EVERY DAY AS NEEDED FOR COUGH OR NASAL CONGESTION 90 tablet 1  . levothyroxine (SYNTHROID) 75 MCG tablet TAKE 1 TABLET BY MOUTH EVERY DAY BEFORE BREAKFAST 90 tablet 0  . meloxicam (MOBIC) 15 MG tablet Take 1 tablet (15 mg total) by mouth every other day. 15 tablet 1  . metFORMIN (GLUCOPHAGE) 500 MG tablet TAKE 1 TABLET BY MOUTH TWICE DAILY WITH A MEAL 180 tablet 1  . Multiple Vitamin (MULTIVITAMIN) capsule Take 1 capsule by mouth daily.    . NYSTATIN powder APPLY EXTERNALLY TO THE AFFECTED AREA TWICE DAILY 30 g 0  . predniSONE (DELTASONE) 5 MG tablet Take 1 tablet (5 mg total) by mouth daily with breakfast. (Patient not taking: Reported on 05/14/2019) 90 tablet 1  . predniSONE (STERAPRED UNI-PAK 21 TAB) 10 MG (21) TBPK tablet Take 6 pills on first day, then taper down by 1 pill daily until gone 21 tablet 0   No current facility-administered medications for this visit.     No Known  Allergies  Family History  Problem Relation Age of Onset  . Arthritis Father   . Diabetes Sister   . Diabetes Daughter   . Cancer Neg Hx   . Heart disease Neg Hx   . Stroke Neg Hx     Social History   Socioeconomic History  . Marital status: Widowed    Spouse name: Not on file  . Number of children: Not on file  . Years of education: Not on file  . Highest education level: Not on file  Occupational History  . Occupation: Retired  Scientific laboratory technician  . Financial resource strain: Not on file  . Food insecurity    Worry: Not on file    Inability: Not on file  . Transportation needs    Medical: Not on file    Non-medical: Not on  file  Tobacco Use  . Smoking status: Former Smoker    Packs/day: 1.50    Years: 30.00    Pack years: 45.00    Types: Cigarettes    Quit date: 11/15/1987    Years since quitting: 31.7  . Smokeless tobacco: Never Used  Substance and Sexual Activity  . Alcohol use: No    Alcohol/week: 0.0 standard drinks  . Drug use: No  . Sexual activity: Not on file  Lifestyle  . Physical activity    Days per week: Not on file    Minutes per session: Not on file  . Stress: Not on file  Relationships  . Social Herbalist on phone: Not on file    Gets together: Not on file    Attends religious service: Not on file    Active member of club or organization: Not on file    Attends meetings of clubs or organizations: Not on file    Relationship status: Not on file  . Intimate partner violence    Fear of current or ex partner: Not on file    Emotionally abused: Not on file    Physically abused: Not on file    Forced sexual activity: Not on file  Other Topics Concern  . Not on file  Social History Narrative  . Not on file     Constitutional: Pt reports fatigue. Denies fever, malaise, headache or abrupt weight changes.  HEENT: Denies eye pain, eye redness, ear pain, ringing in the ears, wax buildup, runny nose, nasal congestion, bloody nose, or sore throat. Respiratory: Pt reports cough and shortness of breath. Denies difficulty breathing, or sputum production.   Cardiovascular: Denies chest pain, chest tightness, palpitations or swelling in the hands or feet.  Gastrointestinal: Denies abdominal pain, bloating, constipation, diarrhea or blood in the stool.  GU: Pt reports urinary frequency and nocturia. Denies urgency, pain with urination, burning sensation, blood in urine, odor or discharge. Musculoskeletal: Pt reports intermittent joint pain, swelling in ankles. Denies decrease in range of motion, difficulty with gait, muscle pain or joint swelling.  Skin: Denies redness, rashes,  lesions or ulcercations.  Neurological: Denies dizziness, difficulty with memory, difficulty with speech or problems with balance and coordination.  Psych: Denies anxiety, depression, SI/HI.  No other specific complaints in a complete review of systems (except as listed in HPI above).  Objective:   Physical Exam  BP 128/84   Pulse 86   Temp 98.2 F (36.8 C) (Temporal)   Wt 191 lb (86.6 kg)   SpO2 98%   BMI 32.79 kg/m  Wt Readings from Last 3 Encounters:  08/12/19 191 lb (  86.6 kg)  05/14/19 189 lb (85.7 kg)  04/16/19 189 lb 9.6 oz (86 kg)    General: Appears her stated age, obese, chronically ill appearing, in NAD. Skin: Warm, dry and intact. No rashes ulcerations noted. HEENT: Head: normal shape and size; Eyes: sclera white, no icterus, conjunctiva pink, PERRLA and EOMs intact; Ears: Tm's gray and intact, normal light reflex;  Neck:  Neck supple, trachea midline. No masses, lumps present.  Cardiovascular: Normal rate and rhythm. S1,S2 noted.  No murmur, rubs or gallops noted. Pulmonary/Chest: Normal effort and positive vesicular breath sounds. No respiratory distress. No wheezes, rales or ronchi noted.  Musculoskeletal: 1 + swelling of bilateral ankles. Gait slow and steady without device. Neurological: Alert and oriented.    BMET    Component Value Date/Time   NA 133 (L) 08/07/2018 1649   K 4.7 08/07/2018 1649   CL 97 08/07/2018 1649   CO2 26 08/07/2018 1649   GLUCOSE 96 08/07/2018 1649   BUN 20 08/07/2018 1649   CREATININE 1.08 08/07/2018 1649   CALCIUM 9.5 08/07/2018 1649   GFRNONAA 52 (L) 01/04/2018 0921   GFRAA >60 01/04/2018 0921    Lipid Panel     Component Value Date/Time   CHOL 163 12/07/2017 1113   TRIG 117.0 12/07/2017 1113   HDL 57.60 12/07/2017 1113   CHOLHDL 3 12/07/2017 1113   VLDL 23.4 12/07/2017 1113   LDLCALC 82 12/07/2017 1113    CBC    Component Value Date/Time   WBC 10.2 08/07/2018 1649   RBC 4.75 08/07/2018 1649   HGB 14.6  08/07/2018 1649   HCT 42.5 08/07/2018 1649   PLT 367.0 08/07/2018 1649   MCV 89.5 08/07/2018 1649   MCH 30.5 01/04/2018 0921   MCHC 34.3 08/07/2018 1649   RDW 14.0 08/07/2018 1649    Hgb A1C Lab Results  Component Value Date   HGBA1C 7.6 (H) 08/07/2018           Assessment & Plan:

## 2019-08-13 ENCOUNTER — Encounter: Payer: Self-pay | Admitting: Internal Medicine

## 2019-08-13 LAB — CBC
HCT: 41.7 % (ref 36.0–46.0)
Hemoglobin: 14 g/dL (ref 12.0–15.0)
MCHC: 33.7 g/dL (ref 30.0–36.0)
MCV: 92 fl (ref 78.0–100.0)
Platelets: 303 10*3/uL (ref 150.0–400.0)
RBC: 4.53 Mil/uL (ref 3.87–5.11)
RDW: 13.5 % (ref 11.5–15.5)
WBC: 9 10*3/uL (ref 4.0–10.5)

## 2019-08-13 LAB — COMPREHENSIVE METABOLIC PANEL
ALT: 15 U/L (ref 0–35)
AST: 18 U/L (ref 0–37)
Albumin: 4.2 g/dL (ref 3.5–5.2)
Alkaline Phosphatase: 65 U/L (ref 39–117)
BUN: 23 mg/dL (ref 6–23)
CO2: 28 mEq/L (ref 19–32)
Calcium: 9.5 mg/dL (ref 8.4–10.5)
Chloride: 101 mEq/L (ref 96–112)
Creatinine, Ser: 0.83 mg/dL (ref 0.40–1.20)
GFR: 65.01 mL/min (ref >60.00)
Glucose, Bld: 209 mg/dL — ABNORMAL HIGH (ref 70–99)
Potassium: 4.7 mEq/L (ref 3.5–5.1)
Sodium: 138 mEq/L (ref 135–145)
Total Bilirubin: 0.3 mg/dL (ref 0.2–1.2)
Total Protein: 6.8 g/dL (ref 6.0–8.3)

## 2019-08-13 LAB — MICROALBUMIN / CREATININE URINE RATIO
Creatinine,U: 135.8 mg/dL
Microalb Creat Ratio: 1 mg/g (ref 0.0–30.0)
Microalb, Ur: 1.4 mg/dL (ref 0.0–1.9)

## 2019-08-13 LAB — LIPID PANEL
Cholesterol: 192 mg/dL (ref 0–200)
HDL: 57.5 mg/dL (ref >39.00)
LDL Cholesterol: 96 mg/dL (ref 0–99)
NonHDL: 134.69
Total CHOL/HDL Ratio: 3
Triglycerides: 195 mg/dL — ABNORMAL HIGH (ref 0.0–149.0)
VLDL: 39 mg/dL (ref 0.0–40.0)

## 2019-08-13 LAB — T4, FREE: Free T4: 0.86 ng/dL (ref 0.60–1.60)

## 2019-08-13 LAB — TSH: TSH: 2.25 u[IU]/mL (ref 0.35–4.50)

## 2019-08-13 LAB — HEMOGLOBIN A1C: Hgb A1c MFr Bld: 8.3 % — ABNORMAL HIGH (ref 4.6–6.5)

## 2019-08-13 NOTE — Assessment & Plan Note (Signed)
A1c and urine microalbumin today Encouraged her to consume a low-carb diet and exercise for weight loss Continue metformin and glipizide Foot exam today Encouraged yearly eye exam She will get her flu and Pneumovax once she is feeling better

## 2019-08-13 NOTE — Patient Instructions (Signed)
COPD and Physical Activity °Chronic obstructive pulmonary disease (COPD) is a long-term (chronic) condition that affects the lungs. COPD is a general term that can be used to describe many different lung problems that cause lung swelling (inflammation) and limit airflow, including chronic bronchitis and emphysema. °The main symptom of COPD is shortness of breath, which makes it harder to do even simple tasks. This can also make it harder to exercise and be active. Talk with your health care provider about treatments to help you breathe better and actions you can take to prevent breathing problems during physical activity. °What are the benefits of exercising with COPD? °Exercising regularly is an important part of a healthy lifestyle. You can still exercise and do physical activities even though you have COPD. Exercise and physical activity improve your shortness of breath by increasing blood flow (circulation). This causes your heart to pump more oxygen through your body. Moderate exercise can improve your: °· Oxygen use. °· Energy level. °· Shortness of breath. °· Strength in your breathing muscles. °· Heart health. °· Sleep. °· Self-esteem and feelings of self-worth. °· Depression, stress, and anxiety levels. °Exercise can benefit everyone with COPD. The severity of your disease may affect how hard you can exercise, especially at first, but everyone can benefit. Talk with your health care provider about how much exercise is safe for you, and which activities and exercises are safe for you. °What actions can I take to prevent breathing problems during physical activity? °· Sign up for a pulmonary rehabilitation program. This type of program may include: °? Education about lung diseases. °? Exercise classes that teach you how to exercise and be more active while improving your breathing. This usually involves: °§ Exercise using your lower extremities, such as a stationary bicycle. °§ About 30 minutes of exercise, 2  to 5 times per week, for 6 to 12 weeks °§ Strength training, such as push ups or leg lifts. °? Nutrition education. °? Group classes in which you can talk with others who also have COPD and learn ways to manage stress. °· If you use an oxygen tank, you should use it while you exercise. Work with your health care provider to adjust your oxygen for your physical activity. Your resting flow rate is different from your flow rate during physical activity. °· While you are exercising: °? Take slow breaths. °? Pace yourself and do not try to go too fast. °? Purse your lips while breathing out. Pursing your lips is similar to a kissing or whistling position. °? If doing exercise that uses a quick burst of effort, such as weight lifting: °§ Breathe in before starting the exercise. °§ Breathe out during the hardest part of the exercise (such as raising the weights). °Where to find support °You can find support for exercising with COPD from: °· Your health care provider. °· A pulmonary rehabilitation program. °· Your local health department or community health programs. °· Support groups, online or in-person. Your health care provider may be able to recommend support groups. °Where to find more information °You can find more information about exercising with COPD from: °· American Lung Association: lung.org. °· COPD Foundation: copdfoundation.org. °Contact a health care provider if: °· Your symptoms get worse. °· You have chest pain. °· You have nausea. °· You have a fever. °· You have trouble talking or catching your breath. °· You want to start a new exercise program or a new activity. °Summary °· COPD is a general term that can   be used to describe many different lung problems that cause lung swelling (inflammation) and limit airflow. This includes chronic bronchitis and emphysema. °· Exercise and physical activity improve your shortness of breath by increasing blood flow (circulation). This causes your heart to provide more  oxygen to your body. °· Contact your health care provider before starting any exercise program or new activity. Ask your health care provider what exercises and activities are safe for you. °This information is not intended to replace advice given to you by your health care provider. Make sure you discuss any questions you have with your health care provider. °Document Released: 11/23/2017 Document Revised: 02/20/2019 Document Reviewed: 11/23/2017 °Elsevier Patient Education © 2020 [employer] Inc. ° °

## 2019-08-13 NOTE — Assessment & Plan Note (Signed)
Will check TSH and free T4 today We will adjust levothyroxine if needed based on labs

## 2019-08-13 NOTE — Assessment & Plan Note (Signed)
Encouraged regular physical activity Discussed how weight loss can help improve joint pain

## 2019-08-13 NOTE — Assessment & Plan Note (Signed)
We will have her hold prednisone 5 mg daily Rx for prednisone taper x6 days Continue Breo Continue to follow with pulmonology

## 2019-08-13 NOTE — Assessment & Plan Note (Signed)
She was unable to tolerate anticholinergics Encouraged timed voiding every 2 hours Advised her to avoid fluids after 7 PM We will monitor

## 2019-08-13 NOTE — Telephone Encounter (Signed)
Prednisone last filled 01/2019 #90 with 1 refill... is pt to continue daily therapy? Please advise

## 2019-08-20 ENCOUNTER — Ambulatory Visit (INDEPENDENT_AMBULATORY_CARE_PROVIDER_SITE_OTHER): Payer: Medicare Other | Admitting: Podiatry

## 2019-08-20 ENCOUNTER — Encounter: Payer: Self-pay | Admitting: Podiatry

## 2019-08-20 ENCOUNTER — Ambulatory Visit (INDEPENDENT_AMBULATORY_CARE_PROVIDER_SITE_OTHER): Payer: Medicare Other

## 2019-08-20 ENCOUNTER — Other Ambulatory Visit: Payer: Self-pay | Admitting: Podiatry

## 2019-08-20 ENCOUNTER — Other Ambulatory Visit: Payer: Self-pay

## 2019-08-20 DIAGNOSIS — M2041 Other hammer toe(s) (acquired), right foot: Secondary | ICD-10-CM

## 2019-08-20 DIAGNOSIS — M2042 Other hammer toe(s) (acquired), left foot: Secondary | ICD-10-CM

## 2019-08-20 DIAGNOSIS — E0843 Diabetes mellitus due to underlying condition with diabetic autonomic (poly)neuropathy: Secondary | ICD-10-CM | POA: Diagnosis not present

## 2019-08-20 DIAGNOSIS — M779 Enthesopathy, unspecified: Secondary | ICD-10-CM

## 2019-08-20 MED ORDER — GABAPENTIN 100 MG PO CAPS: 100.0000 mg | ORAL_CAPSULE | Freq: Three times a day (TID) | ORAL | 3 refills | Status: DC

## 2019-08-23 ENCOUNTER — Other Ambulatory Visit: Payer: Self-pay | Admitting: Pulmonary Disease

## 2019-08-23 NOTE — Progress Notes (Signed)
   HPI: 85 y.o. female with PMHx of type II diabetes mellitus presenting today with a chief complaint of bilateral foot pain that began a few years ago. She reports associated numbness of the toes. She states the symptoms are progressively worsening and she experiences some aching in the toes as well. Walking and standing increases the symptoms. She has been taking Ibuprofen and soaking the feet in Epsom salt with no significant relief. Patient is here for further evaluation and treatment.   Past Medical History:  Diagnosis Date  . Arthritis   . Asthma   . COPD (chronic obstructive pulmonary disease) ([place])   . Diabetes ([place])   . Thyroid disease      Physical Exam: General: The patient is alert and oriented x3 in no acute distress.  Dermatology: Skin is warm, dry and supple bilateral lower extremities. Negative for open lesions or macerations.  Vascular: Palpable pedal pulses bilaterally. No edema or erythema noted. Capillary refill within normal limits.  Neurological: Epicritic and protective threshold diminished bilaterally.   Musculoskeletal Exam: Range of motion within normal limits to all pedal and ankle joints bilateral. Muscle strength 5/5 in all groups bilateral.   Radiographic Exam:  Normal osseous mineralization. Joint spaces preserved. No fracture/dislocation/boney destruction.    Assessment: 1. DM with peripheral polyneuropathy    Plan of Care:  1. Patient evaluated. X-Rays reviewed.  2. Prescription for Gabapentin 100 mg TID provided to patient.  3. Recommended good shoe gear.  4. Return to clinic as needed.       Edrick Kins, DPM [facility]  Dr. Edrick Kins, DPM    [address], [place] [address]                Office [phone]  Fax [phone]

## 2019-08-26 ENCOUNTER — Telehealth: Payer: Self-pay | Admitting: Internal Medicine

## 2019-08-26 NOTE — Telephone Encounter (Signed)
It is not a good idea as she is considered high risk...  8:30 Thursday morning I am only doing nurse visits until 10am

## 2019-08-26 NOTE — Telephone Encounter (Signed)
Appointment 10/15 Pt aware

## 2019-08-26 NOTE — Telephone Encounter (Signed)
Best number [phone]  Pt wanted to schedule flu and pneumonia vaccine.  Pt wanted to come in office. PT Does not want to do the drive threw.  Is ok to schedule and can pt come in office?

## 2019-08-29 ENCOUNTER — Ambulatory Visit (INDEPENDENT_AMBULATORY_CARE_PROVIDER_SITE_OTHER): Payer: Medicare Other

## 2019-08-29 ENCOUNTER — Other Ambulatory Visit: Payer: Self-pay

## 2019-08-29 DIAGNOSIS — Z23 Encounter for immunization: Secondary | ICD-10-CM

## 2019-08-29 NOTE — Progress Notes (Signed)
Per orders of Webb Silversmith, NP, injection of Flu vaccine and Pneumovax 23 given by Lurlean Nanny.  Patient tolerated injection well.

## 2019-08-30 ENCOUNTER — Other Ambulatory Visit: Payer: Self-pay | Admitting: Pulmonary Disease

## 2019-09-02 ENCOUNTER — Telehealth: Payer: Self-pay | Admitting: Pulmonary Disease

## 2019-09-02 MED ORDER — BREO ELLIPTA 100-25 MCG/INH IN AEPB: 1.0000 | INHALATION_SPRAY | Freq: Every day | RESPIRATORY_TRACT | 2 refills | Status: DC

## 2019-09-02 NOTE — Telephone Encounter (Addendum)
Spoke to pt, who is requesting 90 day supply of Breo to be sent to [facility]. Rx has been sent to preferred pharmacy.   Pt is upset that Rx was refilled for 30 day supply vs 90 day supply.  Per our recods, Rx was requested for 60 each with 2 refills from [facility].  Pt has requested that  I mail her proof of this request.  This request has been placed in outgoing mail. Pt is aware and voiced her understanding.

## 2019-09-23 ENCOUNTER — Other Ambulatory Visit: Payer: Self-pay | Admitting: Internal Medicine

## 2019-09-27 ENCOUNTER — Ambulatory Visit (INDEPENDENT_AMBULATORY_CARE_PROVIDER_SITE_OTHER): Payer: Medicare Other | Admitting: Internal Medicine

## 2019-09-27 ENCOUNTER — Encounter: Payer: Self-pay | Admitting: Internal Medicine

## 2019-09-27 ENCOUNTER — Other Ambulatory Visit: Payer: Self-pay

## 2019-09-27 VITALS — BP 134/86 | HR 91 | Temp 97.7°F

## 2019-09-27 DIAGNOSIS — F515 Nightmare disorder: Secondary | ICD-10-CM | POA: Diagnosis not present

## 2019-09-27 DIAGNOSIS — Z634 Disappearance and death of family member: Secondary | ICD-10-CM | POA: Diagnosis not present

## 2019-09-27 DIAGNOSIS — F5104 Psychophysiologic insomnia: Secondary | ICD-10-CM

## 2019-09-27 MED ORDER — ALPRAZOLAM 0.25 MG PO TABS: 0.2500 mg | ORAL_TABLET | Freq: Every evening | ORAL | 0 refills | Status: DC | PRN

## 2019-09-27 NOTE — Progress Notes (Signed)
Subjective:    Patient ID: Megan Bradshaw, female    DOB: 12-Feb-1930, 85 y.o.   MRN: [id number]  HPI  Pt presents to the clinic today with c/o insomnia. She reports she has had difficulty with falling and staying asleep for the last 2-3 weeks. She mentions she averages about 2-3 hours of sleep per night. She mentions she has dreams about her deceased daughters. She is not sure what is triggering these dreams. She has not really had any issues with anxiety or depression but she reports the dreams and lack of sleep are causing anxiety. She has not taken anything OTC for this.  Review of Systems      Past Medical History:  Diagnosis Date  . Arthritis   . Asthma   . COPD (chronic obstructive pulmonary disease) ([place])   . Diabetes ([place])   . Thyroid disease     Current Outpatient Medications  Medication Sig Dispense Refill  . albuterol (PROVENTIL) (2.5 MG/3ML) 0.083% nebulizer solution Take 3 mLs (2.5 mg total) by nebulization every 4 (four) hours as needed for wheezing or shortness of breath. 150 mL 5  . albuterol (VENTOLIN HFA) 108 (90 Base) MCG/ACT inhaler INHALE 1 TO 2 PUFFS BY MOUTH EVERY 6 HOURS FOR PAIN OR WHEEZING OR SHORTNESS OF BREATH 18 g 1  . Cholecalciferol (VITAMIN D PO) Take 1 tablet by mouth daily.    . clobetasol cream (TEMOVATE) [id number] % Apply 1 application topically daily as needed.    Marland Kitchen dextromethorphan (DELSYM) 30 MG/5ML liquid Take 2.5-5 mLs (15-30 mg total) by mouth as needed for cough. 150 mL 12  . fluticasone (FLONASE) 50 MCG/ACT nasal spray Place 1 spray into both nostrils daily. 16 g 10  . fluticasone furoate-vilanterol (BREO ELLIPTA) 100-25 MCG/INH AEPB Inhale 1 puff into the lungs daily. 180 each 2  . gabapentin (NEURONTIN) 100 MG capsule Take 1 capsule (100 mg total) by mouth 3 (three) times daily. 90 capsule 3  . glipiZIDE (GLUCOTROL XL) 10 MG 24 hr tablet TAKE 1 TABLET(10 MG) BY MOUTH DAILY WITH BREAKFAST 90 tablet 0  . levocetirizine (XYZAL) 5 MG tablet TAKE  1 TABLET BY MOUTH EVERY DAY AS NEEDED FOR COUGH OR NASAL CONGESTION 90 tablet 1  . levothyroxine (SYNTHROID) 75 MCG tablet TAKE 1 TABLET BY MOUTH EVERY DAY BEFORE BREAKFAST 90 tablet 0  . metFORMIN (GLUCOPHAGE) 500 MG tablet TAKE 1 TABLET BY MOUTH TWICE DAILY WITH A MEAL 180 tablet 1  . Multiple Vitamin (MULTIVITAMIN) capsule Take 1 capsule by mouth daily.    . NYSTATIN powder APPLY EXTERNALLY TO THE AFFECTED AREA TWICE DAILY 30 g 0  . predniSONE (DELTASONE) 5 MG tablet TAKE 1 TABLET(5 MG) BY MOUTH DAILY WITH BREAKFAST 90 tablet 1   No current facility-administered medications for this visit.     No Known Allergies  Family History  Problem Relation Age of Onset  . Arthritis Father   . Diabetes Sister   . Diabetes Daughter   . Cancer Neg Hx   . Heart disease Neg Hx   . Stroke Neg Hx     Social History   Socioeconomic History  . Marital status: Widowed    Spouse name: Not on file  . Number of children: Not on file  . Years of education: Not on file  . Highest education level: Not on file  Occupational History  . Occupation: Retired  Scientific laboratory technician  . Financial resource strain: Not on file  . Food insecurity  Worry: Not on file    Inability: Not on file  . Transportation needs    Medical: Not on file    Non-medical: Not on file  Tobacco Use  . Smoking status: Former Smoker    Packs/day: 1.50    Years: 30.00    Pack years: 45.00    Types: Cigarettes    Quit date: 11/15/1987    Years since quitting: 31.8  . Smokeless tobacco: Never Used  Substance and Sexual Activity  . Alcohol use: No    Alcohol/week: 0.0 standard drinks  . Drug use: No  . Sexual activity: Not on file  Lifestyle  . Physical activity    Days per week: Not on file    Minutes per session: Not on file  . Stress: Not on file  Relationships  . Social Herbalist on phone: Not on file    Gets together: Not on file    Attends religious service: Not on file    Active member of club or  organization: Not on file    Attends meetings of clubs or organizations: Not on file    Relationship status: Not on file  . Intimate partner violence    Fear of current or ex partner: Not on file    Emotionally abused: Not on file    Physically abused: Not on file    Forced sexual activity: Not on file  Other Topics Concern  . Not on file  Social History Narrative  . Not on file     Constitutional: Denies fever, malaise, fatigue, headache or abrupt weight changes.  Respiratory: Denies difficulty breathing, shortness of breath, cough or sputum production.   Cardiovascular: Denies chest pain, chest tightness, palpitations or swelling in the hands or feet.   Neurological: Pt reports insomnia. Denies dizziness, difficulty with memory, difficulty with speech or problems with balance and coordination.  Psych: Pt reports anxiety. Denies anxiety, depression, SI/HI.  No other specific complaints in a complete review of systems (except as listed in HPI above).  Objective:   Physical Exam    Wt Readings from Last 3 Encounters:  08/12/19 191 lb (86.6 kg)  05/14/19 189 lb (85.7 kg)  04/16/19 189 lb 9.6 oz (86 kg)    General: Appears her stated age, obese, in NAD. Cardiovascular: Normal rate and rhythm.  Pulmonary/Chest: Normal effort and positive vesicular breath sounds. No respiratory distress. No wheezes, rales or ronchi noted.  Neurological: Alert and oriented.  Psychiatric: Tearful  BMET    Component Value Date/Time   NA 138 08/12/2019 1618   K 4.7 08/12/2019 1618   CL 101 08/12/2019 1618   CO2 28 08/12/2019 1618   GLUCOSE 209 (H) 08/12/2019 1618   BUN 23 08/12/2019 1618   CREATININE 0.83 08/12/2019 1618   CALCIUM 9.5 08/12/2019 1618   GFRNONAA 52 (L) 01/04/2018 0921   GFRAA >60 01/04/2018 0921    Lipid Panel     Component Value Date/Time   CHOL 192 08/12/2019 1618   TRIG 195.0 (H) 08/12/2019 1618   HDL 57.50 08/12/2019 1618   CHOLHDL 3 08/12/2019 1618   VLDL 39.0  08/12/2019 1618   LDLCALC 96 08/12/2019 1618    CBC    Component Value Date/Time   WBC 9.0 08/12/2019 1618   RBC 4.53 08/12/2019 1618   HGB 14.0 08/12/2019 1618   HCT 41.7 08/12/2019 1618   PLT 303.0 08/12/2019 1618   MCV 92.0 08/12/2019 1618   MCH 30.5 01/04/2018 [id number]  MCHC 33.7 08/12/2019 1618   RDW 13.5 08/12/2019 1618    Hgb A1C Lab Results  Component Value Date   HGBA1C 8.3 (H) 08/12/2019           Assessment & Plan:  Insomnia due to Dreams, Anxiety:  Will give her a few Xanax 0.25 mg QHS prn- sedation caution given Support offered today Discussed grief counseling if symptoms persists but she wants to hold off for now  Return precautions discussed Webb Silversmith, NP

## 2019-09-27 NOTE — Patient Instructions (Signed)

## 2019-10-04 ENCOUNTER — Encounter: Payer: Self-pay | Admitting: Pulmonary Disease

## 2019-10-04 ENCOUNTER — Ambulatory Visit (INDEPENDENT_AMBULATORY_CARE_PROVIDER_SITE_OTHER): Payer: Medicare Other | Admitting: Pulmonary Disease

## 2019-10-04 ENCOUNTER — Other Ambulatory Visit: Payer: Self-pay

## 2019-10-04 VITALS — BP 130/80 | HR 78 | Temp 97.6°F | Ht 64.0 in | Wt 195.0 lb

## 2019-10-04 DIAGNOSIS — E669 Obesity, unspecified: Secondary | ICD-10-CM | POA: Diagnosis not present

## 2019-10-04 DIAGNOSIS — J449 Chronic obstructive pulmonary disease, unspecified: Secondary | ICD-10-CM

## 2019-10-04 DIAGNOSIS — R0602 Shortness of breath: Secondary | ICD-10-CM | POA: Diagnosis not present

## 2019-10-04 MED ORDER — IPRATROPIUM-ALBUTEROL 0.5-2.5 (3) MG/3ML IN SOLN: 3.0000 mL | Freq: Four times a day (QID) | RESPIRATORY_TRACT | 1 refills | Status: DC | PRN

## 2019-10-04 NOTE — Progress Notes (Signed)
Subjective:    Patient ID: Megan Bradshaw, female    DOB: 12-24-1929, 85 y.o.   MRN: [id number]   Pt profile: 85 y.o. F, former smoker, moved from [place]. Initially seen by Dr Melvyn Novas. Diagnosed with COPD in 2011. Graded as Gold I by Dr.  Melvyn Novas. Chronic prednisone therapy for non-pulmonary diagnosis.  Last PFTs 02/16/2016.  Followed by Dr. Alva Garnet since October 2016.  PROBLEMS: Gold II COPD with chronic asthmatic bronchitis Chronic refractory cough Class III dyspnea  DATA: 10/22/14 PFTs: mild obstruction, FEV1 1.36 L (81%), FEV1/FVC 53%, TLC normal, DLCO 51% pred 02/16/16 PFTs: FVC: 2.23 L (90 %pred), FEV1: 1.30 L (71 %pred), FEV1/FVC: 58% , TLC: invalid, DLCO 78% pred, consistent with mild/moderate obstructive defect.  INTERVAL: Last seen in this office 05/14/19 by Dr. Alva Garnet.  No major pulmonary events.  HPI Patient is an 85 year old former smoker previously followed by Dr. Alva Garnet.  I am assuming care as Dr. Alva Garnet has left the practice.  Last visit here as noted above.  She has not had major difficulties since then. Continues to have shortness of breath out of proportion to her PFT parameters.  She states that she has tried to get oxygen on several occasions however never meets criteria.  She has been on Breo inhaler and as needed albuterol.  She is on prednisone at 5 mg daily for non-pulmonary diagnosis.  Cough is about baseline.  No sputum production of note.  No hemoptysis.  Chronic rhinitis issues controlled with Flonase.  She does complain of lower extremity edema and has significant stasis dermatitis noted.  She seems at times labile today due to the loss of her 2 daughters both of whom perished this year.  She does describe insomnia and anxiety due to this.  She also cites frequent nocturnal awakenings to go to the restroom.  She just recently got a 70-month supply of Breo Ellipta.  She requests that no medication changes to be performed today.  Review of Systems  A 10 point  review of systems was performed and it is as noted above otherwise negative.  BP (!) 160/90 (BP Location: Right Arm, Patient Position: Sitting, Cuff Size: Normal)   Pulse 78   Temp 97.6 F (36.4 C) (Temporal)   Ht 5\' 4"  (1.626 m)   Wt 195 lb (88.5 kg)   SpO2 97% Comment: RA  BMI 33.47 kg/m  I did recheck her blood pressure and this was 130/80. Objective:   Physical Exam Vitals signs and nursing note reviewed.  Constitutional:      General: She is not in acute distress.    Appearance: Normal appearance. She is obese.  HENT:     Head: Normocephalic and atraumatic.     Right Ear: External ear normal.     Left Ear: External ear normal.     Nose:     Comments: Nose/mouth/throat not examined due to masking requirements for COVID 19. Eyes:     General: No scleral icterus.    Conjunctiva/sclera: Conjunctivae normal.     Pupils: Pupils are equal, round, and reactive to light.  Neck:     Musculoskeletal: Neck supple.     Thyroid: No thyromegaly.     Vascular: No JVD.     Trachea: Trachea and phonation normal.  Cardiovascular:     Rate and Rhythm: Normal rate and regular rhythm.     Pulses: Normal pulses.     Heart sounds: Normal heart sounds. No murmur.  Pulmonary:  Effort: Tachypnea (Mild) present.     Breath sounds: No decreased breath sounds, wheezing, rhonchi or rales.     Comments: Coarse breath sounds throughout Abdominal:     General: Abdomen is protuberant. There is no distension.  Musculoskeletal:     Right lower leg: 2+ Pitting Edema present.     Left lower leg: 2+ Pitting Edema present.  Lymphadenopathy:     Cervical: No cervical adenopathy.  Skin:    General: Skin is warm and dry.     Comments: Significant venous stasis dermatitis bilaterally  Neurological:     General: No focal deficit present.     Mental Status: She is alert.  Psychiatric:        Mood and Affect: Mood normal. Affect is labile.   Ambulatory oximetry was performed, she did not have  significant desaturations of oxygen.    Assessment & Plan:   1.  Moderate chronic obstructive pulmonary disease: She does not appear to be maximally compensated.  She may benefit from a switch to Trelegy Ellipta.  She has required that no medication changes to be done today.  We will try her on DuoNeb 3 times a day via nebulizer to see if this helps her some with her symptoms.  Continue Breo Ellipta for now.  Her dyspnea appears to be out of proportion to her last FEV1 of record.  In reviewing data from her previous visits this appears to be the norm.  Will obtain overnight oximetry to exclude nocturnal oxygen desaturation.  2.  Chronic asthmatic bronchitis: No recent exacerbation.  Continue management as above.  3.  Dyspnea/shortness of breath: We will obtain 2D echo to evaluate for potential cardiac causes of dyspnea particularly in view of her lower extremity edema.  Additional issues could be developing cor pulmonale/pulmonary hypertension.  Overnight oximetry as noted above also to evaluate this issue.  4.  Obesity BMI of 33.4: This issue adds complexity to her management.   We will see the patient in follow-up in 2 months time, she is to contact us prior to that time should any new difficulties arise.   Renold Don, MD [facility]    This note was dictated using voice recognition software/Dragon.  Despite best efforts to proofread, errors can occur which can change the meaning.  Any change was purely unintentional.

## 2019-10-04 NOTE — Patient Instructions (Addendum)
1.  We will schedule a oxygen determination at nighttime to see about oxygen needs.  2.  We will also schedule an echocardiogram.  3.  We have sent a prescription to your pharmacy for DuoNeb (ipratropium/albuterol) use it at least 3 times a day and your nebulizer.  4.  We will see you in follow-up in 2 months time.

## 2019-10-07 ENCOUNTER — Other Ambulatory Visit: Payer: Self-pay | Admitting: Internal Medicine

## 2019-10-09 ENCOUNTER — Other Ambulatory Visit: Payer: Self-pay

## 2019-10-14 ENCOUNTER — Ambulatory Visit
Admission: RE | Admit: 2019-10-14 | Discharge: 2019-10-14 | Disposition: A | Discharge status: Home / Self Care | Payer: Medicare Other | Source: Ambulatory Visit | Attending: Pulmonary Disease | Admitting: Pulmonary Disease

## 2019-10-14 ENCOUNTER — Other Ambulatory Visit: Payer: Self-pay

## 2019-10-14 DIAGNOSIS — E119 Type 2 diabetes mellitus without complications: Secondary | ICD-10-CM | POA: Insufficient documentation

## 2019-10-14 DIAGNOSIS — R0600 Dyspnea, unspecified: Secondary | ICD-10-CM | POA: Diagnosis not present

## 2019-10-14 DIAGNOSIS — J449 Chronic obstructive pulmonary disease, unspecified: Secondary | ICD-10-CM | POA: Insufficient documentation

## 2019-10-14 DIAGNOSIS — R0602 Shortness of breath: Secondary | ICD-10-CM

## 2019-10-14 NOTE — Progress Notes (Signed)
*  PRELIMINARY RESULTS* Echocardiogram 2D Echocardiogram has been performed.  Megan Bradshaw 10/14/2019, 11:36 AM

## 2019-10-17 DIAGNOSIS — J449 Chronic obstructive pulmonary disease, unspecified: Secondary | ICD-10-CM | POA: Diagnosis not present

## 2019-10-24 ENCOUNTER — Telehealth: Payer: Self-pay | Admitting: Pulmonary Disease

## 2019-10-24 NOTE — Telephone Encounter (Signed)
ONO results have been reviewed by Dr. Patsey Berthold- normal study.  Pt is aware of results and voiced her understanding. Nothing further is needed.

## 2019-11-19 ENCOUNTER — Other Ambulatory Visit: Payer: Self-pay | Admitting: Internal Medicine

## 2019-12-23 ENCOUNTER — Other Ambulatory Visit: Payer: Self-pay

## 2019-12-23 ENCOUNTER — Ambulatory Visit: Payer: Medicare Other | Admitting: Pulmonary Disease

## 2019-12-23 ENCOUNTER — Encounter: Payer: Self-pay | Admitting: Pulmonary Disease

## 2019-12-23 ENCOUNTER — Other Ambulatory Visit: Payer: Self-pay | Admitting: Internal Medicine

## 2019-12-23 VITALS — BP 138/82 | HR 83 | Temp 97.4°F

## 2019-12-23 DIAGNOSIS — R0602 Shortness of breath: Secondary | ICD-10-CM

## 2019-12-23 DIAGNOSIS — J449 Chronic obstructive pulmonary disease, unspecified: Secondary | ICD-10-CM

## 2019-12-23 DIAGNOSIS — Z9189 Other specified personal risk factors, not elsewhere classified: Secondary | ICD-10-CM

## 2019-12-23 MED ORDER — TRELEGY ELLIPTA 100-62.5-25 MCG/INH IN AEPB: 1.0000 | INHALATION_SPRAY | Freq: Every day | RESPIRATORY_TRACT | 0 refills | Status: DC

## 2019-12-23 NOTE — Progress Notes (Signed)
 Assessment & Plan:  1. At risk for sleep apnea (Primary)  2. Stage 2 moderate COPD by GOLD classification (HCC)  3. Chronic asthmatic bronchitis (HCC)  4. Shortness of breath   Patient Instructions  We are giving a trial of Trelegy Ellipta , 1 puff daily.  Call us  if this is effective for you so that we can call it into your pharmacy/mail order.  Start Trelegy tomorrow.  Do not use Breo while using Trelegy.  Recommend to use albuterol  first thing in the morning to see if this helps with your sensation of shortness of breath in the morning.  We will see you in follow-up in 4 to 6 weeks time.  We will determine at that time if you need a formal sleep study.  Please note: late entry documentation due to logistical difficulties during COVID-19 pandemic. This note is filed for information purposes only, and is not intended to be used for billing, nor does it represent the full scope/nature of the visit in question. Please see any associated scanned media linked to date of encounter for additional pertinent information.  Subjective:    HPI: Megan Bradshaw is a 86 y.o. female presenting to the pulmonology clinic on 12/23/2019 with report of: Follow-up (previous ds pt-pt states she is still SOB. she is requesting O2 at night. chest pain and tightness across mid chest. occ dry cough. Pt denies any wheezing, fever, or chills.)   Pt profile: 86 y.o. F, former smoker, moved from [place]. Initially seen by Dr Darlean. Diagnosed with COPD in 2011. Graded as Gold I by Dr.  Darlean. Chronic prednisone  therapy for non-pulmonary diagnosis.  Last PFTs 02/16/2016.  Followed by Dr. Linard since October 2016.  I assumed care on 04 October 2019 after Dr. Tiney departure   PROBLEMS: Megan Bradshaw COPD with chronic asthmatic bronchitis Chronic refractory cough Class III dyspnea   DATA: 10/22/14 PFTs: mild obstruction, FEV1 1.36 L (81%), FEV1/FVC 53%, TLC normal, DLCO 51% pred 02/16/16 PFTs: FVC: 2.23 L (90  %pred), FEV1: 1.30 L (71 %pred), FEV1/FVC: 58% , TLC: invalid, DLCO 78% pred, consistent with mild/moderate obstructive defect.   INTERVAL: Last seen on 04 October 2019 at that time no new issues, a recommendation of switching to Trelegy was made however the patient requested no changes at that time.  Outpatient Encounter Medications as of 12/23/2019  Medication Sig   Cholecalciferol (VITAMIN D  PO) Take 2,000 Units by mouth daily.   [DISCONTINUED] albuterol  (PROVENTIL ) (2.5 MG/3ML) 0.083% nebulizer solution Take 3 mLs (2.5 mg total) by nebulization every 4 (four) hours as needed for wheezing or shortness of breath.   [DISCONTINUED] albuterol  (VENTOLIN  HFA) 108 (90 Base) MCG/ACT inhaler INHALE 1 TO 2 PUFFS BY MOUTH EVERY 6 HOURS FOR PAIN OR WHEEZING OR SHORTNESS OF BREATH   [DISCONTINUED] ALPRAZolam  (XANAX ) 0.25 MG tablet Take 1 tablet (0.25 mg total) by mouth at bedtime as needed for anxiety.   [DISCONTINUED] clobetasol  cream (TEMOVATE ) 0.05 % Apply 1 application topically daily as needed.    [DISCONTINUED] dextromethorphan  (DELSYM ) 30 MG/5ML liquid Take 2.5-5 mLs (15-30 mg total) by mouth as needed for cough.   [DISCONTINUED] fluticasone  (FLONASE ) 50 MCG/ACT nasal spray Place 1 spray into both nostrils daily. (Patient not taking: Reported on 09/03/2020)   [DISCONTINUED] fluticasone  furoate-vilanterol (BREO ELLIPTA ) 100-25 MCG/INH AEPB Inhale 1 puff into the lungs daily.   [DISCONTINUED] gabapentin  (NEURONTIN ) 100 MG capsule Take 1 capsule (100 mg total) by mouth 3 (three) times daily.   [DISCONTINUED] glipiZIDE  (  GLUCOTROL  XL) 10 MG 24 hr tablet TAKE 1 TABLET(10 MG) BY MOUTH DAILY WITH BREAKFAST   [DISCONTINUED] levocetirizine (XYZAL ) 5 MG tablet TAKE 1 TABLET BY MOUTH EVERY DAY AS NEEDED FOR COUGH OR NASAL CONGESTION   [DISCONTINUED] levothyroxine  (SYNTHROID ) 75 MCG tablet TAKE 1 TABLET BY MOUTH EVERY DAY BEFORE BREAKFAST   [DISCONTINUED] metFORMIN  (GLUCOPHAGE ) 500 MG tablet TAKE 1 TABLET BY MOUTH  TWICE DAILY WITH A MEAL   [DISCONTINUED] Multiple Vitamin (MULTIVITAMIN) capsule Take 1 capsule by mouth daily.   [DISCONTINUED] NYSTATIN  powder APPLY EXTERNALLY TO THE AFFECTED AREA TWICE DAILY   [DISCONTINUED] predniSONE  (DELTASONE ) 5 MG tablet TAKE 1 TABLET(5 MG) BY MOUTH DAILY WITH BREAKFAST   [DISCONTINUED] Fluticasone -Umeclidin-Vilant (TRELEGY ELLIPTA ) 100-62.5-25 MCG/INH AEPB Inhale 1 puff into the lungs daily.   [DISCONTINUED] Fluticasone -Umeclidin-Vilant (TRELEGY ELLIPTA ) 100-62.5-25 MCG/INH AEPB Inhale 1 puff into the lungs daily.   [DISCONTINUED] ipratropium-albuterol  (DUONEB) 0.5-2.5 (3) MG/3ML SOLN Take 3 mLs by nebulization every 6 (six) hours as needed.   No facility-administered encounter medications on file as of 12/23/2019.      Objective:   Vitals:   12/23/19 1017  BP: 138/82  Pulse: 83  Temp: (!) 97.4 F (36.3 C)  Weight: Comment: pt refused  SpO2: 98% Comment: on ra  TempSrc: Temporal     Physical exam documentation is limited by delayed entry of information.

## 2019-12-23 NOTE — Patient Instructions (Signed)
We are giving a trial of Trelegy Ellipta, 1 puff daily.  Call us if this is effective for you so that we can call it into your pharmacy/mail order.  Start Trelegy tomorrow.  Do not use Breo while using Trelegy.  Recommend to use albuterol first thing in the morning to see if this helps with your sensation of shortness of breath in the morning.  We will see you in follow-up in 4 to 6 weeks time.  We will determine at that time if you need a formal sleep study.

## 2020-01-06 ENCOUNTER — Telehealth: Payer: Self-pay | Admitting: Pulmonary Disease

## 2020-01-06 ENCOUNTER — Other Ambulatory Visit: Payer: Self-pay

## 2020-01-06 MED ORDER — TRELEGY ELLIPTA 100-62.5-25 MCG/INH IN AEPB: 1.0000 | INHALATION_SPRAY | Freq: Every day | RESPIRATORY_TRACT | 1 refills | Status: DC

## 2020-01-06 NOTE — Telephone Encounter (Signed)
Rx for trelegy has been sent to preferred pharmacy. ATC to make aware- line rang for 30 sec then received recording that call could not be completed at this time.

## 2020-01-07 NOTE — Telephone Encounter (Signed)
Pt is aware of below message and voiced her understanding. Nothing further is needed. 

## 2020-01-13 ENCOUNTER — Other Ambulatory Visit: Payer: Self-pay | Admitting: Internal Medicine

## 2020-01-20 ENCOUNTER — Ambulatory Visit (INDEPENDENT_AMBULATORY_CARE_PROVIDER_SITE_OTHER): Payer: Medicare Other | Admitting: Internal Medicine

## 2020-01-20 ENCOUNTER — Telehealth: Payer: Self-pay

## 2020-01-20 ENCOUNTER — Encounter: Payer: Self-pay | Admitting: Internal Medicine

## 2020-01-20 ENCOUNTER — Other Ambulatory Visit: Payer: Self-pay

## 2020-01-20 VITALS — BP 130/82 | HR 92 | Temp 98.0°F

## 2020-01-20 DIAGNOSIS — N3000 Acute cystitis without hematuria: Secondary | ICD-10-CM

## 2020-01-20 DIAGNOSIS — R3989 Other symptoms and signs involving the genitourinary system: Secondary | ICD-10-CM | POA: Diagnosis not present

## 2020-01-20 DIAGNOSIS — K5901 Slow transit constipation: Secondary | ICD-10-CM | POA: Diagnosis not present

## 2020-01-20 LAB — POC URINALSYSI DIPSTICK (AUTOMATED)
Bilirubin, UA: NEGATIVE
Glucose, UA: NEGATIVE
Leukocytes, UA: NEGATIVE
Nitrite, UA: POSITIVE
Protein, UA: POSITIVE — AB
Spec Grav, UA: >=1.03 — AB (ref 1.010–1.025)
Urobilinogen, UA: 0.2 E.U./dL
pH, UA: 5.5 (ref 5.0–8.0)

## 2020-01-20 MED ORDER — NITROFURANTOIN MONOHYD MACRO 100 MG PO CAPS: 100.0000 mg | ORAL_CAPSULE | Freq: Two times a day (BID) | ORAL | 0 refills | Status: DC

## 2020-01-20 NOTE — Telephone Encounter (Signed)
[facility] Day - Client TELEPHONE ADVICE RECORD Access[facility]urse Patient Name: Megan Bradshaw Gender: Female DOB: 1930/07/28 Age: 86 Y 66 M 19 D Return Phone Number: [phone] (Primary), [phone] (Secondary) Address: City/State/Zip: [place] Day - Client Client Site [place] - Day Physician Webb Silversmith - NP Contact Type Call Who Is Calling Patient / Member / Family / Caregiver Call Type Triage / Clinical Relationship To Patient Self Return Phone Number [phone] (Primary) Chief Complaint ABDOMINAL PAIN - Severe and only in abdomen Reason for Call Symptomatic / Request for [place] states she has severe abdominal pain and diarrhea. Vaginal area is itching. Translation No Nurse Assessment Nurse: Delafuente, RN, Irma Date/Time (Eastern Time): 01/20/2020 8:13:44 AM Confirm and document reason for call. If symptomatic, describe symptoms. ---Caller stated she was constipated and she took Ex-Lax yesterday, and this morning she has soft runny stool. Stated she has abdominal pain that comes and goes. No other symptoms reported. Has the patient had close contact with a person known or suspected to have the novel coronavirus illness OR traveled / lives in area with major community spread (including international travel) in the last 14 days from the onset of symptoms? * If Asymptomatic, screen for exposure and travel within the last 14 days. ---No Does the patient have any new or worsening symptoms? ---Yes Will a triage be completed? ---Yes Related visit to physician within the last 2 weeks? ---No Does the PT have any chronic conditions? (i.e. diabetes, asthma, this includes High risk factors for pregnancy, etc.) ---Yes List chronic conditions. ---diabetes Is this a behavioral health or substance abuse call? ---No Guidelines Guideline Title Affirmed Question  Affirmed Notes Nurse Date/Time (Eastern Time) Abdominal Pain - Female [1] MODERATE pain (e.g., interferes with normal activities) AND [2] pain comes and goes (cramps) AND [3] present Delafuente, RN, Irma 01/20/2020 8:18:13 AM PLEASE NOTE: All timestamps contained within this report are represented as [place] Standard Time. CONFIDENTIALTY NOTICE: This fax transmission is intended only for the addressee. It contains information that is legally privileged, confidential or otherwise protected from use or disclosure. If you are not the intended recipient, you are strictly prohibited from reviewing, disclosing, copying using or disseminating any of this information or taking any action in reliance on or regarding this information. If you have received this fax in error, please notify us immediately by telephone so that we can arrange for its return to [place]. Phone: [phone], Toll-Free: [phone], Fax: [phone] Page: 2 of 2 Call Id: [id number] Guidelines Guideline Title Affirmed Question Affirmed Notes Nurse Date/Time Eilene Ghazi Time) > 24 hours (Exception: pain with Vomiting or Diarrhea - see that Guideline) Disp. Time Eilene Ghazi Time) Disposition Final User 01/20/2020 8:12:13 AM Send to Urgent Queue Baruch Goldmann 01/20/2020 8:20:32 AM See PCP within 24 Hours Yes Delafuente, RN, Benay Spice Caller Disagree/Comply Comply Caller Understands Yes PreDisposition Call Doctor Care Advice Given Per Guideline SEE PCP WITHIN 24 HOURS: * IF OFFICE WILL BE OPEN: You need to be seen within the next 24 hours. Call your doctor (or NP/PA) when the office opens and make an appointment. CRAMPS: Your cramps may be due to an intestinal virus or from something that you ate. During cramps, drink some water, then lie down and try to find a comfortable position. CALL BACK IF: * Severe pain lasts over 1 hour * Constant pain lasts over 2 hours * You become worse. Comments User: Baruch Goldmann Date/Time Eilene Ghazi Time):  01/20/2020  8:10:23 AM Caller was transferred from the office. Referrals REFERRED TO PCP OFFICE

## 2020-01-20 NOTE — Telephone Encounter (Signed)
[facility] Night - Client Nonclinical Telephone Record [facility] Client [facility] Night - Client Client Site [place] Physician Webb Silversmith - NP Contact Type Call Who Is Calling Patient / Member / Family / Caregiver Caller Name [place] Phone Number [phone] Patient Name Megan Bradshaw Patient DOB [birth year] Call Type Message Only Information Provided Reason for Call Request to Schedule Office Appointment Initial Comment Caller states she needs an appointment Additional Comment Caller states she needs an appointment with Rollene Fare. Could the office please contact the caller? Disp. Time Disposition Final User 01/20/2020 7:40:27 AM General Information Provided Yes King-Hussey, Berdi Call Closed By: Bonnita Nasuti Transaction Date/Time: 01/20/2020 7:37:47 AM (ET)

## 2020-01-20 NOTE — Progress Notes (Signed)
HPI  Pt presents to the clinic today with constipation. This started 1 week ago. She took Ex Lax which did help her have a BM. She has taken a Probiotic OTC as well.   She also reports bladder pressure. This started 3 days ago. She is unable to describe how the pain feels. She has had some intermittent nausea about 1 hour after eating. She has had some low back pain. She denies vomiting. She denies vaginal irritation, discharge, odor or abnormal bleeding. She denies fever, chills or body aches. She has not taken anything OTC for these symptoms.    Review of Systems  Past Medical History:  Diagnosis Date  . Arthritis   . Asthma   . COPD (chronic obstructive pulmonary disease) ([place])   . Diabetes ([place])   . Thyroid disease     Family History  Problem Relation Age of Onset  . Arthritis Father   . Diabetes Sister   . Diabetes Daughter   . Cancer Neg Hx   . Heart disease Neg Hx   . Stroke Neg Hx     Social History   Socioeconomic History  . Marital status: Widowed    Spouse name: Not on file  . Number of children: Not on file  . Years of education: Not on file  . Highest education level: Not on file  Occupational History  . Occupation: Retired  Tobacco Use  . Smoking status: Former Smoker    Packs/day: 1.50    Years: 30.00    Pack years: 45.00    Types: Cigarettes    Quit date: 11/15/1987    Years since quitting: 32.2  . Smokeless tobacco: Never Used  Substance and Sexual Activity  . Alcohol use: No    Alcohol/week: 0.0 standard drinks  . Drug use: No  . Sexual activity: Not on file  Other Topics Concern  . Not on file  Social History Narrative  . Not on file   Social Determinants of Health   Financial Resource Strain:   . Difficulty of Paying Living Expenses: Not on file  Food Insecurity:   . Worried About Charity fundraiser in the Last Year: Not on file  . Ran Out of Food in the Last Year: Not on file  Transportation Needs:   . Lack of Transportation  (Medical): Not on file  . Lack of Transportation (Non-Medical): Not on file  Physical Activity:   . Days of Exercise per Week: Not on file  . Minutes of Exercise per Session: Not on file  Stress:   . Feeling of Stress : Not on file  Social Connections:   . Frequency of Communication with Friends and Family: Not on file  . Frequency of Social Gatherings with Friends and Family: Not on file  . Attends Religious Services: Not on file  . Active Member of Clubs or Organizations: Not on file  . Attends Archivist Meetings: Not on file  . Marital Status: Not on file  Intimate Partner Violence:   . Fear of Current or Ex-Partner: Not on file  . Emotionally Abused: Not on file  . Physically Abused: Not on file  . Sexually Abused: Not on file    No Known Allergies   Constitutional: Denies fever, malaise, fatigue, headache or abrupt weight changes.   GI: Pt reports constipation and nausea. Denies vomiting or blood in the stool  GU: Pt reports bladder pressure. Denies urgency, frequency, dysuria, burning sensation, blood in urine, odor or discharge.  Skin: Denies redness, rashes, lesions or ulcercations.   No other specific complaints in a complete review of systems (except as listed in HPI above).    Objective:   Physical Exam  BP 130/82   Pulse 92   Temp 98 F (36.7 C) (Temporal)   SpO2 94%   Wt Readings from Last 3 Encounters:  10/04/19 195 lb (88.5 kg)  08/12/19 191 lb (86.6 kg)  05/14/19 189 lb (85.7 kg)    General: Appears her stated age, obese, in NAD. Cardiovascular: Normal rate and rhythm. S1,S2 noted.   Pulmonary/Chest: Normal effort and positive vesicular breath sounds. No respiratory distress. No wheezes, rales or ronchi noted.  Abdomen: Soft. Normal bowel sounds. No distention or masses noted.  Tender to palpation over the bladder area. No CVA tenderness.        Assessment & Plan:   Bladder Pressure secondary to UTI:  Urinalysis: + nitrites, 1+  leuk Will send urine culture eRx sent if for Macrobid 100 mg BID x 5 days OK to take AZO OTC Drink plenty of fluids  Constipation:  Continue Probiotic Increase water intake  RTC as needed or if symptoms persist. Webb Silversmith, NP This visit occurred during the SARS-CoV-2 public health emergency.  Safety protocols were in place, including screening questions prior to the visit, additional usage of staff PPE, and extensive cleaning of exam room while observing appropriate contact time as indicated for disinfecting solutions.

## 2020-01-20 NOTE — Addendum Note (Signed)
Addended by: Lurlean Nanny on: 01/20/2020 04:39 PM   Modules accepted: Orders

## 2020-01-20 NOTE — Telephone Encounter (Signed)
Will discuss at upcoming appt.

## 2020-01-20 NOTE — Patient Instructions (Signed)

## 2020-01-20 NOTE — Telephone Encounter (Signed)
Pt already has appt to see pt on 01/20/20 at 3PM with Avie Echevaria NP.

## 2020-01-21 LAB — URINE CULTURE
MICRO NUMBER:: 10225905
SPECIMEN QUALITY:: ADEQUATE

## 2020-01-30 ENCOUNTER — Telehealth: Payer: Self-pay | Admitting: Pulmonary Disease

## 2020-01-30 NOTE — Telephone Encounter (Signed)
This fax was placed in Dr. Patsey Berthold mail folder ,she will be in office tomorrow 01/31/20

## 2020-01-31 ENCOUNTER — Ambulatory Visit: Payer: Medicare Other | Admitting: Pulmonary Disease

## 2020-01-31 ENCOUNTER — Encounter: Payer: Self-pay | Admitting: Pulmonary Disease

## 2020-01-31 ENCOUNTER — Other Ambulatory Visit: Payer: Self-pay

## 2020-01-31 VITALS — BP 120/88 | HR 76 | Temp 98.2°F | Ht 64.0 in | Wt 178.8 lb

## 2020-01-31 DIAGNOSIS — J449 Chronic obstructive pulmonary disease, unspecified: Secondary | ICD-10-CM

## 2020-01-31 DIAGNOSIS — R634 Abnormal weight loss: Secondary | ICD-10-CM

## 2020-01-31 DIAGNOSIS — R0602 Shortness of breath: Secondary | ICD-10-CM

## 2020-01-31 MED ORDER — TRELEGY ELLIPTA 100-62.5-25 MCG/INH IN AEPB: 1.0000 | INHALATION_SPRAY | Freq: Every day | RESPIRATORY_TRACT | 2 refills | Status: DC

## 2020-01-31 NOTE — Progress Notes (Signed)
 Assessment & Plan:  1. Chronic asthmatic bronchitis (HCC) (Primary)  2. Shortness of breath  3. Weight loss   Patient Instructions  Continue Trelegy for now.  Recommend a chest x-ray, you have expressed interest in getting this done at [facility].  We will see you in follow-up in 3 months time call sooner should any new difficulties arise  Please note: late entry documentation due to logistical difficulties during COVID-19 pandemic. This note is filed for information purposes only, and is not intended to be used for billing, nor does it represent the full scope/nature of the visit in question. Please see any associated scanned media linked to date of encounter for additional pertinent information.  Subjective:    HPI: Megan Bradshaw is a 86 y.o. female presenting to the pulmonology clinic on 01/31/2020 with report of: Follow-up (Patient has been doing better since last visit. She stopped Breo and started Trelegy and has noticed a little difference. Patient got 2nd covid vaccine Tuesday and has more fatigue than normal.)     Outpatient Encounter Medications as of 01/31/2020  Medication Sig   Cholecalciferol (VITAMIN D  PO) Take 2,000 Units by mouth daily.   [DISCONTINUED] albuterol  (PROVENTIL ) (2.5 MG/3ML) 0.083% nebulizer solution Take 3 mLs (2.5 mg total) by nebulization every 4 (four) hours as needed for wheezing or shortness of breath.   [DISCONTINUED] albuterol  (VENTOLIN  HFA) 108 (90 Base) MCG/ACT inhaler INHALE 1 TO 2 PUFFS BY MOUTH EVERY 6 HOURS FOR PAIN OR WHEEZING OR SHORTNESS OF BREATH   [DISCONTINUED] clobetasol  cream (TEMOVATE ) 0.05 % Apply 1 application topically daily as needed.    [DISCONTINUED] dextromethorphan  (DELSYM ) 30 MG/5ML liquid Take 2.5-5 mLs (15-30 mg total) by mouth as needed for cough.   [DISCONTINUED] fluticasone  (FLONASE ) 50 MCG/ACT nasal spray Place 1 spray into both nostrils daily. (Patient not taking: Reported on 09/03/2020)   [DISCONTINUED]  fluticasone  furoate-vilanterol (BREO ELLIPTA ) 100-25 MCG/INH AEPB Inhale 1 puff into the lungs daily.   [DISCONTINUED] Fluticasone -Umeclidin-Vilant (TRELEGY ELLIPTA ) 100-62.5-25 MCG/INH AEPB Inhale 1 puff into the lungs daily.   [DISCONTINUED] Fluticasone -Umeclidin-Vilant (TRELEGY ELLIPTA ) 100-62.5-25 MCG/INH AEPB Inhale 1 puff into the lungs daily. (Patient not taking: Reported on 09/03/2020)   [DISCONTINUED] gabapentin  (NEURONTIN ) 100 MG capsule Take 1 capsule (100 mg total) by mouth 3 (three) times daily.   [DISCONTINUED] glipiZIDE  (GLUCOTROL  XL) 10 MG 24 hr tablet TAKE 1 TABLET(10 MG) BY MOUTH DAILY WITH BREAKFAST   [DISCONTINUED] ipratropium-albuterol  (DUONEB) 0.5-2.5 (3) MG/3ML SOLN Take 3 mLs by nebulization every 6 (six) hours as needed.   [DISCONTINUED] levothyroxine  (SYNTHROID ) 75 MCG tablet TAKE 1 TABLET BY MOUTH EVERY DAY BEFORE BREAKFAST   [DISCONTINUED] metFORMIN  (GLUCOPHAGE ) 500 MG tablet TAKE 1 TABLET BY MOUTH TWICE DAILY WITH A MEAL   [DISCONTINUED] Multiple Vitamin (MULTIVITAMIN) capsule Take 1 capsule by mouth daily.   [DISCONTINUED] nitrofurantoin , macrocrystal-monohydrate, (MACROBID ) 100 MG capsule Take 1 capsule (100 mg total) by mouth 2 (two) times daily.   [DISCONTINUED] NYSTATIN  powder APPLY EXTERNALLY TO THE AFFECTED AREA TWICE DAILY   [DISCONTINUED] predniSONE  (DELTASONE ) 5 MG tablet TAKE 1 TABLET(5 MG) BY MOUTH DAILY WITH BREAKFAST   No facility-administered encounter medications on file as of 01/31/2020.      Objective:   Vitals:   01/31/20 1010  BP: 120/88  Pulse: 76  Temp: 98.2 F (36.8 C)  Height: 5' 4 (1.626 m)  Weight: 178 lb 12.8 oz (81.1 kg)  SpO2: 95%  TempSrc: Temporal  BMI (Calculated): 30.68     Physical exam  documentation is limited by delayed entry of information.

## 2020-01-31 NOTE — Patient Instructions (Addendum)
Continue Trelegy for now.  Recommend a chest x-ray, you have expressed interest in getting this done at [facility].  We will see you in follow-up in 3 months time call sooner should any new difficulties arise

## 2020-02-04 NOTE — Telephone Encounter (Signed)
Routing to Dr. Darnell Level

## 2020-02-04 NOTE — Telephone Encounter (Signed)
I do not see this form in Dr. Domingo Dimes folder. Not sure if its been faxed.  Dr. Patsey Berthold, do you remember signing this document?

## 2020-02-04 NOTE — Telephone Encounter (Signed)
I do remember signing something.  But I can sign again.

## 2020-02-10 ENCOUNTER — Telehealth: Payer: Self-pay | Admitting: Pulmonary Disease

## 2020-02-10 ENCOUNTER — Ambulatory Visit: Payer: Medicare Other | Admitting: Internal Medicine

## 2020-02-10 DIAGNOSIS — J449 Chronic obstructive pulmonary disease, unspecified: Secondary | ICD-10-CM

## 2020-02-10 NOTE — Telephone Encounter (Signed)
Pt states that she was suppose to get a CXR done at her appt time on 01/31/20 but didn't have time so she was going to have it done at her PCP [facility] office, but when she called them, they said it couldn't be done at their office. She would like an explanation on what to do next. She doesn't want to drive to [facility] because it's a 30 minute drive, where [facility] office is 5 minutes from her house. Cb#: [phone]

## 2020-02-10 NOTE — Telephone Encounter (Signed)
Spoke with the pt  She states when had televisit on 01/31/20 Dr Patsey Berthold ordered a cxr  She is just now getting around to get this done  She states called Dr Reginold Agent office and was advised that this could not be performed there  I advised she can go to [facility] [place] and get this done  She agreed to go there and so I have placed the order

## 2020-02-18 ENCOUNTER — Ambulatory Visit
Admission: RE | Admit: 2020-02-18 | Discharge: 2020-02-18 | Disposition: A | Discharge status: Home / Self Care | Payer: Medicare Other | Source: Ambulatory Visit | Attending: Pulmonary Disease | Admitting: Pulmonary Disease

## 2020-02-18 DIAGNOSIS — J449 Chronic obstructive pulmonary disease, unspecified: Secondary | ICD-10-CM | POA: Insufficient documentation

## 2020-02-18 DIAGNOSIS — R05 Cough: Secondary | ICD-10-CM | POA: Diagnosis not present

## 2020-02-22 ENCOUNTER — Other Ambulatory Visit: Payer: Self-pay | Admitting: Internal Medicine

## 2020-03-05 ENCOUNTER — Encounter: Payer: Self-pay | Admitting: Family Medicine

## 2020-03-05 ENCOUNTER — Telehealth (INDEPENDENT_AMBULATORY_CARE_PROVIDER_SITE_OTHER): Payer: Medicare Other | Admitting: Family Medicine

## 2020-03-05 ENCOUNTER — Other Ambulatory Visit: Payer: Self-pay

## 2020-03-05 ENCOUNTER — Telehealth: Payer: Self-pay

## 2020-03-05 VITALS — Ht 64.0 in | Wt 180.0 lb

## 2020-03-05 DIAGNOSIS — J0100 Acute maxillary sinusitis, unspecified: Secondary | ICD-10-CM

## 2020-03-05 DIAGNOSIS — J449 Chronic obstructive pulmonary disease, unspecified: Secondary | ICD-10-CM

## 2020-03-05 MED ORDER — AMOXICILLIN-POT CLAVULANATE 875-125 MG PO TABS: 1.0000 | ORAL_TABLET | Freq: Two times a day (BID) | ORAL | 0 refills | Status: AC

## 2020-03-05 NOTE — Progress Notes (Addendum)
     Kailin Principato T. Alynna Hargrove, MD Primary Care and [place] at [facility] [place], [address] Phone: [phone]  FAX: [phone]  Megan Bradshaw - 86 y.o. female  MRN [id number]  Date of Birth: 1930-08-30  Visit Date: 03/05/2020  PCP: Jearld Fenton, NP  Referred by: Jearld Fenton, NP  Virtual Visit via Telephone Note:  I connected with  Megan Bradshaw on 03/05/2020 11:00 AM EDT by telephone and verified that I am speaking with the correct person using two identifiers.   Location patient: home phone or cell phone Location provider: work or home office Consent: Verbal consent directly obtained from [facility] and that there may be a patient responsible charge related to this service. Persons participating in the virtual visit: patient, provider  I discussed the limitations of evaluation and management by telemedicine and the availability of in person appointments.  The patient expressed understanding and agreed to proceed.     History of Present Illness:  86 yo with sinus pain.  Barky cough.  No F.  She is a pleasant lady and she has had a barky cough for about 7 days.  She has pain in her upper facial and maxillary sinuses region.  She is not have any form of fever.  No nausea, vomiting, diarrhea.  No rashes or neurological symptoms.  She is eating and drinking normally.  Review of Systems: pertinent positives and pertinent negatives as per HPI No acute distress verbally   Observations/Objective/Exam:  An attempt was made to discern vital signs over the phone and per patient if applicable and possible.   Neurological:     Mental Status: pleasant and appropriate   Psychiatric:        Thought Content: Thought content normal.      Assessment and Plan:    ICD-10-CM   1. Acute non-recurrent maxillary sinusitis  J01.00   2. COPD GOLD I  J44.9    Total encounter time: 20 minutes. On the day of the patient  encounter, this can include review of prior records, labs, and imaging.  Additional time can include counselling, consultation with peer MD in person or by telephone.  This also includes independent review of Radiology.  No pulmonary symptoms.  Consistent with sinusitis, treat as such.  Viral sinusitis also possible.  I discussed the assessment and treatment plan with the patient. The patient was provided an opportunity to ask questions and all were answered. The patient agreed with the plan and demonstrated an understanding of the instructions.   The patient was advised to call back or seek an in-person evaluation if the symptoms worsen or if the condition fails to improve as anticipated.  Follow-up: prn unless noted otherwise below No follow-ups on file.  Meds ordered this encounter  Medications  . amoxicillin-clavulanate (AUGMENTIN) 875-125 MG tablet    Sig: Take 1 tablet by mouth 2 (two) times daily for 10 days.    Dispense:  20 tablet    Refill:  0   No orders of the defined types were placed in this encounter.   Signed,  Maud Deed. Jaylee Lantry, MD

## 2020-03-05 NOTE — Telephone Encounter (Signed)
[facility] - Client Nonclinical Telephone Record AccessNurse Client [place] [facility] - Client Client Site [place] Physician Webb Silversmith - NP Contact Type Call Who Is Calling Patient / Member / Family / Caregiver Caller Name [place] Phone Number [phone] Patient Name Megan Bradshaw Patient DOB 09/19/1930 Call Type Message Only Information Provided Reason for Call Request to Schedule Office Appointment Initial Comment Caller states she needs an appt. for sinus issue. Additional Comment Disp. Time Disposition Final User 03/05/2020 7:59:16 AM General Information Provided Yes Scharlene Corn Call Closed By: Scharlene Corn Transaction Date/Time: 03/05/2020 7:57:37 AM (ET)

## 2020-03-24 ENCOUNTER — Other Ambulatory Visit: Payer: Self-pay | Admitting: Podiatry

## 2020-03-28 ENCOUNTER — Other Ambulatory Visit: Payer: Self-pay | Admitting: Internal Medicine

## 2020-03-30 ENCOUNTER — Other Ambulatory Visit: Payer: Self-pay

## 2020-03-30 ENCOUNTER — Encounter: Payer: Self-pay | Admitting: Internal Medicine

## 2020-03-30 ENCOUNTER — Ambulatory Visit (INDEPENDENT_AMBULATORY_CARE_PROVIDER_SITE_OTHER): Payer: Medicare Other | Admitting: Internal Medicine

## 2020-03-30 ENCOUNTER — Encounter: Payer: Self-pay | Admitting: Pulmonary Disease

## 2020-03-30 VITALS — BP 132/84 | HR 91 | Temp 97.6°F

## 2020-03-30 DIAGNOSIS — T380X5D Adverse effect of glucocorticoids and synthetic analogues, subsequent encounter: Secondary | ICD-10-CM

## 2020-03-30 DIAGNOSIS — J441 Chronic obstructive pulmonary disease with (acute) exacerbation: Secondary | ICD-10-CM

## 2020-03-30 DIAGNOSIS — E099 Drug or chemical induced diabetes mellitus without complications: Secondary | ICD-10-CM

## 2020-03-30 LAB — POCT GLYCOSYLATED HEMOGLOBIN (HGB A1C): Hemoglobin A1C: 7.7 % — AB (ref 4.0–5.6)

## 2020-03-30 MED ORDER — PREDNISONE 10 MG PO TABS: ORAL_TABLET | ORAL | 0 refills | Status: DC

## 2020-03-30 MED ORDER — HYDROCODONE-HOMATROPINE 5-1.5 MG/5ML PO SYRP: 5.0000 mL | ORAL_SOLUTION | Freq: Three times a day (TID) | ORAL | 0 refills | Status: DC | PRN

## 2020-03-30 NOTE — Progress Notes (Signed)
Subjective:    Patient ID: Megan Bradshaw, female    DOB: 1929-12-30, 86 y.o.   MRN: [id number]  HPI  Pt presents to the clinic today to follow-up DM2.  Her last A1c was 8.3%, LDL 96, triglycerides 195, 07/2019.  She is taking Metformin (only 1 x day instead of 2) and Glipizide as prescribed. She takes Gabapentin for neuropathic pain. She does not check her sugars.  She does not routinely check her feet.  Her last eye exam was more than 1 year ago.  Flu 08/2019. Pneumovax 08/2019.  Prevnar 10/16.  She has had her her Covid vaccines  She also reports nasal congestion, cough. She has a chronic cough but has worsened in the last 10 days. The cough has been productive of thick yellow mucous. The cough is keeping her up at night. She has shortness of breath. She denies headache, runny nose, ear pain, sore throat, loss of taste or smell. She denies fever, chills or body aches. She has been taking neb treatments 4 x day and Delsym. She had a chest xray 02/18/20 which was normal. She was treated for a sinus infection 4/22 with Augmentin.  Review of Systems      Past Medical History:  Diagnosis Date  . Arthritis   . Asthma   . COPD (chronic obstructive pulmonary disease) ([place])   . Diabetes ([place])   . Thyroid disease     Current Outpatient Medications  Medication Sig Dispense Refill  . albuterol (PROVENTIL) (2.5 MG/3ML) 0.083% nebulizer solution Take 3 mLs (2.5 mg total) by nebulization every 4 (four) hours as needed for wheezing or shortness of breath. 150 mL 5  . albuterol (VENTOLIN HFA) 108 (90 Base) MCG/ACT inhaler INHALE 1 TO 2 PUFFS BY MOUTH EVERY 6 HOURS FOR PAIN OR WHEEZING OR SHORTNESS OF BREATH 18 g 1  . Cholecalciferol (VITAMIN D PO) Take 1 tablet by mouth daily.    . clobetasol cream (TEMOVATE) [id number] % Apply 1 application topically daily as needed.    Marland Kitchen dextromethorphan (DELSYM) 30 MG/5ML liquid Take 2.5-5 mLs (15-30 mg total) by mouth as needed for cough. 150 mL 12  . fluticasone  (FLONASE) 50 MCG/ACT nasal spray Place 1 spray into both nostrils daily. 16 g 10  . Fluticasone-Umeclidin-Vilant (TRELEGY ELLIPTA) 100-62.5-25 MCG/INH AEPB Inhale 1 puff into the lungs daily. 180 each 2  . gabapentin (NEURONTIN) 100 MG capsule TAKE 1 CAPSULE(100 MG) BY MOUTH THREE TIMES DAILY 90 capsule 3  . glipiZIDE (GLUCOTROL XL) 10 MG 24 hr tablet TAKE 1 TABLET(10 MG) BY MOUTH DAILY WITH BREAKFAST 90 tablet 0  . ipratropium-albuterol (DUONEB) 0.5-2.5 (3) MG/3ML SOLN Take 3 mLs by nebulization every 6 (six) hours as needed. 360 mL 1  . levothyroxine (SYNTHROID) 75 MCG tablet TAKE 1 TABLET BY MOUTH EVERY DAY BEFORE BREAKFAST 90 tablet 0  . metFORMIN (GLUCOPHAGE) 500 MG tablet TAKE 1 TABLET BY MOUTH TWICE DAILY WITH A MEAL 180 tablet 0  . Multiple Vitamin (MULTIVITAMIN) capsule Take 1 capsule by mouth daily.    . NYSTATIN powder APPLY EXTERNALLY TO THE AFFECTED AREA TWICE DAILY 30 g 0  . predniSONE (DELTASONE) 5 MG tablet TAKE 1 TABLET(5 MG) BY MOUTH DAILY WITH BREAKFAST 90 tablet 0   No current facility-administered medications for this visit.    No Known Allergies  Family History  Problem Relation Age of Onset  . Arthritis Father   . Diabetes Sister   . Diabetes Daughter   . Cancer Neg Hx   .  Heart disease Neg Hx   . Stroke Neg Hx     Social History   Socioeconomic History  . Marital status: Widowed    Spouse name: Not on file  . Number of children: Not on file  . Years of education: Not on file  . Highest education level: Not on file  Occupational History  . Occupation: Retired  Tobacco Use  . Smoking status: Former Smoker    Packs/day: 1.50    Years: 30.00    Pack years: 45.00    Types: Cigarettes    Quit date: 11/15/1987    Years since quitting: 32.3  . Smokeless tobacco: Never Used  Substance and Sexual Activity  . Alcohol use: No    Alcohol/week: 0.0 standard drinks  . Drug use: No  . Sexual activity: Not on file  Other Topics Concern  . Not on file  Social  History Narrative  . Not on file   Social Determinants of Health   Financial Resource Strain:   . Difficulty of Paying Living Expenses:   Food Insecurity:   . Worried About Charity fundraiser in the Last Year:   . Arboriculturist in the Last Year:   Transportation Needs:   . Film/video editor (Medical):   Marland Kitchen Lack of Transportation (Non-Medical):   Physical Activity:   . Days of Exercise per Week:   . Minutes of Exercise per Session:   Stress:   . Feeling of Stress :   Social Connections:   . Frequency of Communication with Friends and Family:   . Frequency of Social Gatherings with Friends and Family:   . Attends Religious Services:   . Active Member of Clubs or Organizations:   . Attends Archivist Meetings:   Marland Kitchen Marital Status:   Intimate Partner Violence:   . Fear of Current or Ex-Partner:   . Emotionally Abused:   Marland Kitchen Physically Abused:   . Sexually Abused:      Constitutional: Pt reports fatigue. Denies fever, malaise, headache or abrupt weight changes.  HEENT: Pt reports nasal congestion. Denies eye pain, eye redness, ear pain, ringing in the ears, wax buildup, runny nose, bloody nose, or sore throat. Respiratory: Pt reports cough, shortness of breath. Denies difficulty breathing.   Cardiovascular: Denies chest pain, chest tightness, palpitations or swelling in the hands or feet.  Skin: Denies redness, rashes, lesions or ulcercations.  Neurological: Pt reports neuropathic pain in feet. Denies dizziness, difficulty with memory, difficulty with speech or problems with balance and coordination.    No other specific complaints in a complete review of systems (except as listed in HPI above).  Objective:   Physical Exam BP 132/84   Pulse 91   Temp 97.6 F (36.4 C) (Temporal)   SpO2 98%   Wt Readings from Last 3 Encounters:  03/05/20 180 lb (81.6 kg)  01/31/20 178 lb 12.8 oz (81.1 kg)  10/04/19 195 lb (88.5 kg)    General: Appears her stated age,  well developed, well nourished. Skin: Warm, dry and intact. No lower extremity ulcerations noted. HEENT: Head: normal shape and size; Nose: congested; Throat/Mouth: Teeth present, mucosa pink and moist, +PND, no exudate, lesions or ulcerations noted.  Neck:  No adenopathy noted Cardiovascular: Normal rate and rhythm. S1,S2 noted.  No murmur, rubs or gallops noted.  Pulmonary/Chest: Increased effort with diminished breath sounds. No respiratory distress. No wheezes, rales or ronchi noted.  Neurological: Alert and oriented. Increased sensitivity but to lower extremities  but decreased sensation in the bottoms of her feet noted.    BMET    Component Value Date/Time   NA 138 08/12/2019 1618   K 4.7 08/12/2019 1618   CL 101 08/12/2019 1618   CO2 28 08/12/2019 1618   GLUCOSE 209 (H) 08/12/2019 1618   BUN 23 08/12/2019 1618   CREATININE 0.83 08/12/2019 1618   CALCIUM 9.5 08/12/2019 1618   GFRNONAA 52 (L) 01/04/2018 0921   GFRAA >60 01/04/2018 0921    Lipid Panel     Component Value Date/Time   CHOL 192 08/12/2019 1618   TRIG 195.0 (H) 08/12/2019 1618   HDL 57.50 08/12/2019 1618   CHOLHDL 3 08/12/2019 1618   VLDL 39.0 08/12/2019 1618   LDLCALC 96 08/12/2019 1618    CBC    Component Value Date/Time   WBC 9.0 08/12/2019 1618   RBC 4.53 08/12/2019 1618   HGB 14.0 08/12/2019 1618   HCT 41.7 08/12/2019 1618   PLT 303.0 08/12/2019 1618   MCV 92.0 08/12/2019 1618   MCH 30.5 01/04/2018 0921   MCHC 33.7 08/12/2019 1618   RDW 13.5 08/12/2019 1618    Hgb A1C Lab Results  Component Value Date   HGBA1C 8.3 (H) 08/12/2019            Assessment & Plan:  COPD Exacerbation:  No indication for additional abx at this time RX for Pred Taper at this time, monitor sugars RX for Hycodan for cough Continue neb treatments as prescribed Follow up with pulmonology as previously scheduled  Make an appt for your Medicare Wellness Exam Webb Silversmith, NP This visit occurred during the  SARS-CoV-2 public health emergency.  Safety protocols were in place, including screening questions prior to the visit, additional usage of staff PPE, and extensive cleaning of exam room while observing appropriate contact time as indicated for disinfecting solutions.

## 2020-03-30 NOTE — Patient Instructions (Signed)

## 2020-03-30 NOTE — Assessment & Plan Note (Signed)
POCT A1C 7.7% Will check urine microalbumin at annual exam Continue Metformin and Glipizide Encouraged her to consume a low carb diet Foot exam today Encouraged routine eye exam- she has declined the past year due to Covid Immunizations UTD

## 2020-03-31 DIAGNOSIS — R0602 Shortness of breath: Secondary | ICD-10-CM | POA: Diagnosis not present

## 2020-04-01 DIAGNOSIS — R0602 Shortness of breath: Secondary | ICD-10-CM | POA: Diagnosis not present

## 2020-04-02 ENCOUNTER — Telehealth: Payer: Self-pay | Admitting: Pulmonary Disease

## 2020-04-02 NOTE — Telephone Encounter (Signed)
Papers have been faxed over to [facility]. Patient does not need oxygen per Dr. Patsey Berthold. Papers placed in scan folder. Nothing further needed at this time.

## 2020-04-16 ENCOUNTER — Other Ambulatory Visit: Payer: Self-pay | Admitting: Internal Medicine

## 2020-04-23 ENCOUNTER — Telehealth: Payer: Self-pay | Admitting: Pulmonary Disease

## 2020-04-23 NOTE — Telephone Encounter (Signed)
ATC- no answer with no option to leave vm.  Will call back 

## 2020-04-24 NOTE — Telephone Encounter (Signed)
Spoke to pt, who stated that she plans to travel to [place] on 05/04/2020. Pt is questioning if Dr. Patsey Berthold thinks it would be okay for her to travel.  Pt states that her sob is baseline, however her cough has worsen and she is concerned that Trelegy could be causing this.  Per pt's chart, she is scheduled for OV on 04/30/2020 to discuss this matter. Pt is also requesting ONO results. Pt is aware that results have been placed in Dr. Domingo Dimes folder for review.  Dr. Patsey Berthold please advise. Thanks.

## 2020-04-27 NOTE — Telephone Encounter (Signed)
Pt is aware of below message and voiced her understanding. Nothing further is needed. 

## 2020-04-27 NOTE — Telephone Encounter (Signed)
Will discuss on follow-up

## 2020-04-30 ENCOUNTER — Encounter: Payer: Self-pay | Admitting: Pulmonary Disease

## 2020-04-30 ENCOUNTER — Other Ambulatory Visit: Payer: Self-pay

## 2020-04-30 ENCOUNTER — Ambulatory Visit: Payer: Medicare Other | Admitting: Pulmonary Disease

## 2020-04-30 VITALS — BP 128/82 | HR 90 | Temp 98.6°F | Ht 64.0 in | Wt 190.8 lb

## 2020-04-30 DIAGNOSIS — J449 Chronic obstructive pulmonary disease, unspecified: Secondary | ICD-10-CM

## 2020-04-30 MED ORDER — AZITHROMYCIN 250 MG PO TABS: ORAL_TABLET | ORAL | 0 refills | Status: AC

## 2020-04-30 NOTE — Progress Notes (Signed)
 Assessment & Plan:  There are no diagnoses linked to this encounter.  Patient Instructions  Unfortunately you continue not to qualify for oxygen .   For your upcoming trip I recommend that you take frequent breaks to stretch out your legs.   Continue Trelegy 1 inhalation daily.   We will send the CPAP to your pharmacy.   In 3 months time call sooner should any new difficulties arise.  Please note: late entry documentation due to logistical difficulties during COVID-19 pandemic. This note is filed for information purposes only, and is not intended to be used for billing, nor does it represent the full scope/nature of the visit in question. Please see any associated scanned media linked to date of encounter for additional pertinent information.  Subjective:    HPI: Megan Bradshaw is a 86 y.o. female presenting to the pulmonology clinic on 04/30/2020 with report of: Follow-up (pt plans to travel to to [place] 05/05/2020. pt reports of sob with exertion, dry cough at times prod with light yellow mucus and occ wheezing. )     Outpatient Encounter Medications as of 04/30/2020  Medication Sig   Cholecalciferol (VITAMIN D  PO) Take 2,000 Units by mouth daily.   [DISCONTINUED] albuterol  (PROVENTIL ) (2.5 MG/3ML) 0.083% nebulizer solution Take 3 mLs (2.5 mg total) by nebulization every 4 (four) hours as needed for wheezing or shortness of breath.   [DISCONTINUED] albuterol  (VENTOLIN  HFA) 108 (90 Base) MCG/ACT inhaler INHALE 1 TO 2 PUFFS BY MOUTH EVERY 6 HOURS FOR PAIN OR WHEEZING OR SHORTNESS OF BREATH   [DISCONTINUED] clobetasol  cream (TEMOVATE ) 0.05 % Apply 1 application topically daily as needed.    [DISCONTINUED] dextromethorphan  (DELSYM ) 30 MG/5ML liquid Take 2.5-5 mLs (15-30 mg total) by mouth as needed for cough.   [DISCONTINUED] fluticasone  (FLONASE ) 50 MCG/ACT nasal spray Place 1 spray into both nostrils daily. (Patient not taking: Reported on 09/03/2020)   [DISCONTINUED]  Fluticasone -Umeclidin-Vilant (TRELEGY ELLIPTA ) 100-62.5-25 MCG/INH AEPB Inhale 1 puff into the lungs daily. (Patient not taking: Reported on 09/03/2020)   [DISCONTINUED] gabapentin  (NEURONTIN ) 100 MG capsule TAKE 1 CAPSULE(100 MG) BY MOUTH THREE TIMES DAILY   [DISCONTINUED] glipiZIDE  (GLUCOTROL  XL) 10 MG 24 hr tablet TAKE 1 TABLET(10 MG) BY MOUTH DAILY WITH BREAKFAST   [DISCONTINUED] levothyroxine  (SYNTHROID ) 75 MCG tablet TAKE 1 TABLET BY MOUTH EVERY DAY BEFORE BREAKFAST   [DISCONTINUED] metFORMIN  (GLUCOPHAGE ) 500 MG tablet Take 1 tablet (500 mg total) by mouth daily with breakfast.   [DISCONTINUED] Multiple Vitamin (MULTIVITAMIN) capsule Take 1 capsule by mouth daily.   [DISCONTINUED] NYSTATIN  powder APPLY EXTERNALLY TO THE AFFECTED AREA TWICE DAILY   [DISCONTINUED] predniSONE  (DELTASONE ) 10 MG tablet Take 6 tabs day 1, 5 tabs day 2, 4 tabs day 3, 3 tabs day 4, 2 tabs day 5, 1 tab day 6   [DISCONTINUED] predniSONE  (DELTASONE ) 5 MG tablet TAKE 1 TABLET(5 MG) BY MOUTH DAILY WITH BREAKFAST   [EXPIRED] azithromycin  (ZITHROMAX ) 250 MG tablet Take 2 tablets (500 mg) on  Day 1,  followed by 1 tablet (250 mg) once daily on Days 2 through 5.   [DISCONTINUED] HYDROcodone -homatropine (HYCODAN) 5-1.5 MG/5ML syrup Take 5 mLs by mouth every 8 (eight) hours as needed for cough. (Patient not taking: Reported on 09/03/2020)   [DISCONTINUED] ipratropium-albuterol  (DUONEB) 0.5-2.5 (3) MG/3ML SOLN Take 3 mLs by nebulization every 6 (six) hours as needed.   No facility-administered encounter medications on file as of 04/30/2020.      Objective:   Vitals:   04/30/20 1029  BP:  128/82  Pulse: 90  Temp: 98.6 F (37 C)  Height: 5' 4 (1.626 m)  Weight: 190 lb 12.8 oz (86.5 kg)  SpO2: 97%  TempSrc: Temporal  BMI (Calculated): 32.73     Physical exam documentation is limited by delayed entry of information.

## 2020-04-30 NOTE — Patient Instructions (Signed)
Unfortunately you continue not to qualify for oxygen.   For your upcoming trip I recommend that you take frequent breaks to stretch out your legs.   Continue Trelegy 1 inhalation daily.   We will send the CPAP to your pharmacy.   In 3 months time call sooner should any new difficulties arise.

## 2020-05-12 ENCOUNTER — Other Ambulatory Visit: Payer: Self-pay

## 2020-05-12 ENCOUNTER — Ambulatory Visit (INDEPENDENT_AMBULATORY_CARE_PROVIDER_SITE_OTHER)
Admission: RE | Admit: 2020-05-12 | Discharge: 2020-05-12 | Disposition: A | Discharge status: Home / Self Care | Payer: Medicare Other | Source: Ambulatory Visit | Attending: Family Medicine | Admitting: Family Medicine

## 2020-05-12 ENCOUNTER — Ambulatory Visit (INDEPENDENT_AMBULATORY_CARE_PROVIDER_SITE_OTHER): Payer: Medicare Other | Admitting: Family Medicine

## 2020-05-12 VITALS — BP 140/98 | HR 88 | Temp 97.9°F

## 2020-05-12 DIAGNOSIS — S6992XA Unspecified injury of left wrist, hand and finger(s), initial encounter: Secondary | ICD-10-CM

## 2020-05-12 DIAGNOSIS — S62397A Other fracture of fifth metacarpal bone, left hand, initial encounter for closed fracture: Secondary | ICD-10-CM

## 2020-05-12 DIAGNOSIS — N644 Mastodynia: Secondary | ICD-10-CM | POA: Diagnosis not present

## 2020-05-12 NOTE — Patient Instructions (Signed)
-   You broke your hand - urgent referral to orthopedic specialist

## 2020-05-12 NOTE — Progress Notes (Signed)
Subjective:     Megan Bradshaw is a 86 y.o. female presenting for Hand Injury (x 3 days)     HPI   #Hand injury - was in [place] on for funeral - Injury: was sitting in a roll chair and rolled backward - hit her head and hand on ledge behind the hair - immediate pain - hand pain is getting worse - hit the outer part of the hand - concerned about a fracture - Treatment: took some ibuprofen, ice - endorses swelling which is improving  #Breast pain - after XR pt with sudden pain in lower left breast - severe but eased off in a few seconds - still somewhat tender 10 minutes later  Review of Systems   Social History   Tobacco Use  Smoking Status Former Smoker  . Packs/day: 1.50  . Years: 30.00  . Pack years: 45.00  . Types: Cigarettes  . Quit date: 11/15/1987  . Years since quitting: 32.5  Smokeless Tobacco Never Used        Objective:    BP Readings from Last 3 Encounters:  05/12/20 (!) 140/98  04/30/20 128/82  03/30/20 132/84   Wt Readings from Last 3 Encounters:  04/30/20 190 lb 12.8 oz (86.5 kg)  03/05/20 180 lb (81.6 kg)  01/31/20 178 lb 12.8 oz (81.1 kg)    BP (!) 140/98   Pulse 88   Temp 97.9 F (36.6 C) (Temporal)   SpO2 97%    Physical Exam Constitutional:      General: She is not in acute distress.    Appearance: She is well-developed. She is not diaphoretic.  HENT:     Right Ear: External ear normal.     Left Ear: External ear normal.     Nose: Nose normal.  Eyes:     Conjunctiva/sclera: Conjunctivae normal.  Cardiovascular:     Rate and Rhythm: Normal rate.  Pulmonary:     Effort: Pulmonary effort is normal.  Chest:     Comments: TTP along the left lower breast Musculoskeletal:     Cervical back: Neck supple.     Comments: Left hand: Inspection: bruising along the dorsal surface near metacarpal 3-5 and mind palm. Swelling over the same area Palpation: TTP along the 3-5th metacarpal and the palm, mild tenderness at the wrist  but minimal compared to hand ROM: normal wrist Strength: normal wrist flexion/extension  Skin:    General: Skin is warm and dry.     Capillary Refill: Capillary refill takes less than 2 seconds.  Neurological:     Mental Status: She is alert. Mental status is at baseline.  Psychiatric:        Mood and Affect: Mood normal.        Behavior: Behavior normal.     XR: Fracture of the fifth metacarpal       Assessment & Plan:   Problem List Items Addressed This Visit    None    Visit Diagnoses    Hand injury, left, initial encounter    -  Primary   Relevant Orders   DG Hand Complete Left   Other fracture of fifth metacarpal bone, left hand, initial encounter for closed fracture       Relevant Orders   Ambulatory referral to Orthopedic Surgery     Urgent referral to ortho for evaluation and management Disc printed and appt scheduled for 2:30 pm today Advised NSAIDs and ice prn  Breast pain - advised trial of voltaren gel  on the area of concern. Discussed red flags including SOB, worsening pain. Pt reassured and not concerned for cardiac and agree given location and improvement as well as reproducible with palpation, though will call if worsening or not improving.   Return if symptoms worsen or fail to improve.  Lesleigh Noe, MD  This visit occurred during the SARS-CoV-2 public health emergency.  Safety protocols were in place, including screening questions prior to the visit, additional usage of staff PPE, and extensive cleaning of exam room while observing appropriate contact time as indicated for disinfecting solutions.

## 2020-05-20 ENCOUNTER — Other Ambulatory Visit: Payer: Self-pay | Admitting: Internal Medicine

## 2020-06-02 ENCOUNTER — Other Ambulatory Visit: Payer: Self-pay | Admitting: Internal Medicine

## 2020-06-04 DIAGNOSIS — S62367A Nondisplaced fracture of neck of fifth metacarpal bone, left hand, initial encounter for closed fracture: Secondary | ICD-10-CM | POA: Diagnosis not present

## 2020-06-21 ENCOUNTER — Other Ambulatory Visit: Payer: Self-pay | Admitting: Internal Medicine

## 2020-06-22 ENCOUNTER — Ambulatory Visit
Admission: EM | Admit: 2020-06-22 | Discharge: 2020-06-22 | Disposition: A | Discharge status: Home / Self Care | Payer: Medicare Other | Attending: Emergency Medicine | Admitting: Emergency Medicine

## 2020-06-22 ENCOUNTER — Other Ambulatory Visit: Payer: Self-pay

## 2020-06-22 ENCOUNTER — Telehealth: Payer: Self-pay

## 2020-06-22 DIAGNOSIS — J0100 Acute maxillary sinusitis, unspecified: Secondary | ICD-10-CM

## 2020-06-22 MED ORDER — PREDNISONE 10 MG PO TABS: 40.0000 mg | ORAL_TABLET | Freq: Every day | ORAL | 0 refills | Status: AC

## 2020-06-22 MED ORDER — AMOXICILLIN 875 MG PO TABS
875.0000 mg | ORAL_TABLET | Freq: Two times a day (BID) | ORAL | 0 refills | Status: AC
Start: 2020-06-22 — End: 2020-06-29

## 2020-06-22 NOTE — Discharge Instructions (Signed)
Take the amoxicillin and prednisone as directed.  Follow up with your primary care provider if your symptoms are not improving.    

## 2020-06-22 NOTE — Telephone Encounter (Addendum)
I spoke with pt; pt has not checked BS; pt said that her BS is OK. Pt states she has sinus infection; pt has asthma and COPD; pt has dry hacky cough but has drainage that goes down back of throat for 3 mths; pt has irritated throat on and off. No fever or chills. Pt has usual SOB with COPD. No H/A,dizziness, CP, and no abd pain and diarrhea. Pt said her chest is sore due to coughing. Pt taking neb treatments and using flonase and pt has had both covid symptoms. Pt said she is not going to an UC or ED. Avie Echevaria NP has already answered this note and Threasa Beards CMA will speak with pt.

## 2020-06-22 NOTE — Telephone Encounter (Signed)
Patient contacted the office and states that she has a sinus infection, and that she needs to see Rollene Fare. She states that she has a sore throat, post nasal drip, and headache.  I advised that with her sx, we are unable to bring her in the office due to protocol, but we are more than happy to see her for a virtual visit. Patient became very upset and said that she needs to be seen in person. I did advise an UC, but she startes she only wants to see Rollene Fare.  I advised her that I would send a message to Rollene Fare to see if she has any other recommendations.

## 2020-06-22 NOTE — Telephone Encounter (Signed)
She knows she can't be seen in the office. Recommend virtual or UC.

## 2020-06-22 NOTE — ED Triage Notes (Signed)
Patient reports intermittent nasal drainage and cough x3 months. Reports she has been taking delsym, at home nebs, OTC allergy medication, and flonase. Reports nothing has helped symptoms.

## 2020-06-22 NOTE — Telephone Encounter (Signed)
Melanie CMA said that when she went on line there was no one there.I called pt back and pt said she had been on the phone for an hour and people keep on hanging up on her. I advised that no one here hung up and it could be a problem with the phone system dropping calls but I did not hang up on her. Pt notified as instructed and voiced understanding.Pt said she could have lied but she did not and still she cannot see her provider. Pt said she does not know how to do a virtual appt. Pt is very upset that she cannot be seen. Pt said she does not know what the hell is going on; I asked pt not to curse while we were speaking and pt said hell was not cursing and she was just not feeling well and wanted an appt to see Avie Echevaria NP. I advised her again what Rollene Fare had said and with our guidelines why pt could not come in office but we could either do a virtual appt or pt could go to UC. Pt said she wanted to speak with [facility] CMA. I went to ask Threasa Beards to speak with pt and she said to tell pt she was bringing back a pt and Threasa Beards CMA would call pt back. I verified pts return call # [phone] and pt said yes that was the correct # and pt apologized for speaking to me the way she had but she just did not feel good and wanted to see Avie Echevaria NP. I accepted her apology and apologized to pt that I did understand what she was saying but due to the guidelines I have been given that I could not give her an in office appt. Pt said she understood but still wants to speak with [facility] CMA. I advised pt that Threasa Beards would call her back. Pt said OK.

## 2020-06-22 NOTE — Telephone Encounter (Signed)
[place] [facility] - Client TELEPHONE ADVICE RECORD [facility] Patient Name: Megan Bradshaw Gender: Female DOB: [birth year] Age: 86 Y 11 M 23 D Return Phone Number: [phone] (Secondary) Address: City/State/Zip: [place] [address] Client [facility] - Client Client Site [place] - Day Physician Webb Silversmith - NP Contact Type Call Who Is Calling Patient / Member / Family / Caregiver Call Type Triage / Clinical Relationship To Patient Self Return Phone Number [phone] (Secondary) Chief Complaint Dizziness Reason for Call Symptomatic / Request for Megan Bradshaw states she is experiencing dizziness lightheadedness and sweating. Caller states she is not sure if it is her blood sugar level but she is not feeling well. Additional Comment per charge nurse to make this chart urgent. Translation No Nurse Assessment Nurse: Zenia Resides, RN, Diane Date/Time Eilene Ghazi Time): 06/22/2020 8:23:27 AM Confirm and document reason for call. If symptomatic, describe symptoms. ---Caller states she may have a sinus infection. No fever. Feels drainage down the back of her throat. Hx of COPD and asthma. No worsening sob. Occasional cough- non productive. Symptoms since Friday. Has the patient had close contact with a person known or suspected to have the novel coronavirus illness OR traveled / lives in area with major community spread (including international travel) in the last 14 days from the onset of symptoms? * If Asymptomatic, screen for exposure and travel within the last 14 days. ---No Does the patient have any new or worsening symptoms? ---Yes Will a triage be completed? ---Yes Related visit to physician within the last 2 weeks? ---No Does the PT have any chronic conditions? (i.e. diabetes, asthma, this includes High risk factors for pregnancy, etc.) ---Yes List chronic conditions.  ---asthma, copd Is this a behavioral health or substance abuse call? ---No Guidelines Guideline Title Affirmed Question Affirmed Notes Nurse Date/Time Eilene Ghazi Time) Sinus Pain or Congestion [1] Sinus congestion as part of a cold AND [2] present < 10 days Zenia Resides, RN, Diane 06/22/2020 8:26:10 AM PLEASE NOTE: All timestamps contained within this report are represented as [place] Standard Time. CONFIDENTIALTY NOTICE: This fax transmission is intended only for the addressee. It contains information that is legally privileged, confidential or otherwise protected from use or disclosure. If you are not the intended recipient, you are strictly prohibited from reviewing, disclosing, copying using or disseminating any of this information or taking any action in reliance on or regarding this information. If you have received this fax in error, please notify us immediately by telephone so that we can arrange for its return to [place]. Phone: [phone], Toll-Free: [phone], Fax: [phone] Page: 2 of 2 Call Id: [id number] [place]. Time Eilene Ghazi Time) Disposition Final User 06/22/2020 8:21:06 AM Send to Urgent Queue Josephine Cables 06/22/2020 8:27:40 AM See PCP within 24 Hours Yes Zenia Resides, RN, Diane Disposition Overriden: Home Care Override Reason: Patient's symptoms need a higher level of care Caller Disagree/Comply Comply Caller Understands Yes PreDisposition Call Doctor Care Advice Given Per Guideline SEE PCP WITHIN 24 HOURS: * IF OFFICE WILL BE OPEN: You need to be examined within the next 24 hours. Call your doctor (or NP/PA) when the office opens and make an appointment. NASAL WASHES FOR A STUFFY NOSE: * Introduction: Saline (salt water) nasal irrigation (nasal wash) is an effective and simple home remedy for treating stuffy nose and sinus congestion. The nose can be irrigated by pouring, spraying, or squirting salt water into the nose and then letting it run back out. CARE  ADVICE given per Sinus Pain  or Congestion (Adult) guideline. CALL BACK IF: * Difficulty breathing (and not relieved by cleaning out nose) Referrals REFERRED TO PCP OFFICE

## 2020-06-22 NOTE — Telephone Encounter (Signed)
I spoke to pt and she is aware of why we are unable to see her in the office and was advised to go to [facility] on [address]... pt stated she still does not know why she can not be seen as she has had Covid vaccine... pt stated she would call UC to see if they are taking walk-ins and go there... after viewing her chart, pt did go to UC was prescribed Pred taper and Ax

## 2020-06-22 NOTE — Telephone Encounter (Signed)
[place] [facility] - Client TELEPHONE ADVICE RECORD [facility] Patient Name: Megan Bradshaw Gender: Female DOB: [birth year] Age: 86 Y 11 M 23 D Return Phone Number: [phone] (Secondary) Address: City/State/Zip: [place] [address] Client [facility] - Client Client Site [place] - Day Physician Webb Silversmith - NP Contact Type Call Who Is Calling Patient / Member / Family / Caregiver Call Type Triage / Clinical Relationship To Patient Self Return Phone Number [phone] (Secondary) Chief Complaint Dizziness Reason for Call Symptomatic / Request for [name] states she is experiencing dizziness lightheadedness and sweating. Caller states she is not sure if it is her blood sugar level but she is not feeling well. Additional Comment per charge nurse to make this chart urgent. Translation No Nurse Assessment Nurse: Zenia Resides, RN, Diane Date/Time Eilene Ghazi Time): 06/22/2020 8:23:27 AM Confirm and document reason for call. If symptomatic, describe symptoms. ---Caller states she may have a sinus infection. No fever. Feels drainage down the back of her throat. Hx of COPD and asthma. No worsening sob. Occasional cough- non productive. Symptoms since Friday. Has the patient had close contact with a person known or suspected to have the novel coronavirus illness OR traveled / lives in area with major community spread (including international travel) in the last 14 days from the onset of symptoms? * If Asymptomatic, screen for exposure and travel within the last 14 days. ---No Does the patient have any new or worsening symptoms? ---Yes Will a triage be completed? ---Yes Related visit to physician within the last 2 weeks? ---No Does the PT have any chronic conditions? (i.e. diabetes, asthma, this includes High risk factors for pregnancy, etc.) ---Yes List chronic conditions.  ---asthma, copd Is this a behavioral health or substance abuse call? ---No Guidelines Guideline Title Affirmed Question Affirmed Notes Nurse Date/Time Eilene Ghazi Time) Sinus Pain or Congestion [1] Sinus congestion as part of a cold AND [2] present < 10 days Zenia Resides, RN, Diane 06/22/2020 8:26:10 AM PLEASE NOTE: All timestamps contained within this report are represented as [place] Standard Time. CONFIDENTIALTY NOTICE: This fax transmission is intended only for the addressee. It contains information that is legally privileged, confidential or otherwise protected from use or disclosure. If you are not the intended recipient, you are strictly prohibited from reviewing, disclosing, copying using or disseminating any of this information or taking any action in reliance on or regarding this information. If you have received this fax in error, please notify us immediately by telephone so that we can arrange for its return to [place]. Phone: [phone], Toll-Free: [phone], Fax: [phone] Page: 2 of 2 Call Id: [id number] [place]. Time Eilene Ghazi Time) Disposition Final User 06/22/2020 8:21:06 AM Send to Urgent Queue Josephine Cables 06/22/2020 8:27:40 AM See PCP within 24 Hours Yes Zenia Resides, RN, Diane Disposition Overriden: Home Care Override Reason: Patient's symptoms need a higher level of care Caller Disagree/Comply Comply Caller Understands Yes PreDisposition Call Doctor Care Advice Given Per Guideline SEE PCP WITHIN 24 HOURS: * IF OFFICE WILL BE OPEN: You need to be examined within the next 24 hours. Call your doctor (or NP/PA) when the office opens and make an appointment. NASAL WASHES FOR A STUFFY NOSE: * Introduction: Saline (salt water) nasal irrigation (nasal wash) is an effective and simple home remedy for treating stuffy nose and sinus congestion. The nose can be irrigated by pouring, spraying, or squirting salt water into the nose and then letting it run back out. CARE  ADVICE given per Sinus Pain  or Congestion (Adult) guideline. CALL BACK IF: * Difficulty breathing (and not relieved by cleaning out nose) Referrals REFERRED TO PCP OFFICE

## 2020-06-22 NOTE — ED Provider Notes (Signed)
[facility]    CSN: [id number] Arrival date & time: 06/22/20  1346      History   Chief Complaint Chief Complaint  Patient presents with  . nasal drainage  . Cough    HPI Megan Bradshaw is a 86 y.o. female.   Patient presents with sinus drainage, congestion, and cough intermittently x3 months.  She states she just does not feel well and is planning to travel to [place] to see her son.  She states she would like to be well for this travel.  She has been treating her symptoms at home with Delsym, nebulizer treatment, Flonase, and OTC allergy medication.  Patient has history of COPD.  She is fully vaccinated for COVID.    The history is provided by the patient.    Past Medical History:  Diagnosis Date  . Arthritis   . Asthma   . COPD (chronic obstructive pulmonary disease) ([place])   . Diabetes ([place])   . Thyroid disease     Patient Active Problem List   Diagnosis Date Noted  . Elevated blood pressure reading in office without diagnosis of hypertension 06/07/2018  . Arthritis 12/07/2017  . OAB (overactive bladder) 09/01/2016  . Seasonal allergies 09/01/2016  . 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 Hypothyroid 09/22/2014  . COPD GOLD I 08/12/2014    Past Surgical History:  Procedure Laterality Date  . ABDOMINAL HYSTERECTOMY  1984  . APPENDECTOMY  1939   . CATARACT EXTRACTION, BILATERAL    . CHOLECYSTECTOMY  1987  . NASAL SINUS SURGERY  1990  . REPLACEMENT TOTAL KNEE Right 2007    OB History   No obstetric history on file.      Home Medications    Prior to Admission medications   Medication Sig Start Date End Date Taking? Authorizing Provider  albuterol (PROVENTIL) (2.5 MG/3ML) 0.083% nebulizer solution Take 3 mLs (2.5 mg total) by nebulization every 4 (four) hours as needed for wheezing or shortness of breath. 01/30/19   Wilhelmina Mcardle, MD  albuterol (VENTOLIN HFA) 108 (90 Base) MCG/ACT inhaler INHALE  1 TO 2 PUFFS BY MOUTH EVERY 6 HOURS FOR PAIN OR WHEEZING OR SHORTNESS OF BREATH 05/21/20   Jearld Fenton, NP  amoxicillin (AMOXIL) 875 MG tablet Take 1 tablet (875 mg total) by mouth 2 (two) times daily for 7 days. 06/22/20 06/29/20  Sharion Balloon, NP  Cholecalciferol (VITAMIN D PO) Take 1 tablet by mouth daily.    [provider]  clobetasol cream (TEMOVATE) 3.81 % Apply 1 application topically daily as needed.    [provider]  fluticasone (FLONASE) 50 MCG/ACT nasal spray Place 1 spray into both nostrils daily. 04/16/19   Wilhelmina Mcardle, MD  Fluticasone-Umeclidin-Vilant (TRELEGY ELLIPTA) 100-62.5-25 MCG/INH AEPB Inhale 1 puff into the lungs daily. 01/31/20   Tyler Pita, MD  gabapentin (NEURONTIN) 100 MG capsule TAKE 1 CAPSULE(100 MG) BY MOUTH THREE TIMES DAILY 03/24/20   Edrick Kins, DPM  glipiZIDE (GLUCOTROL XL) 10 MG 24 hr tablet TAKE 1 TABLET(10 MG) BY MOUTH DAILY WITH BREAKFAST 04/17/20   Baity, Coralie Keens, NP  HYDROcodone-homatropine (HYCODAN) 5-1.5 MG/5ML syrup Take 5 mLs by mouth every 8 (eight) hours as needed for cough. 03/30/20   Jearld Fenton, NP  ipratropium-albuterol (DUONEB) 0.5-2.5 (3) MG/3ML SOLN Take 3 mLs by nebulization every 6 (six) hours as needed. 10/04/19 01/31/20  Tyler Pita, MD  levothyroxine (SYNTHROID) 75 MCG tablet TAKE 1 TABLET  BY MOUTH EVERY DAY BEFORE BREAKFAST 03/28/20   Jearld Fenton, NP  metFORMIN (GLUCOPHAGE) 500 MG tablet Take 1 tablet (500 mg total) by mouth daily with breakfast. 03/30/20   Jearld Fenton, NP  Multiple Vitamin (MULTIVITAMIN) capsule Take 1 capsule by mouth daily.    [provider]  NYSTATIN powder APPLY EXTERNALLY TO THE AFFECTED AREA TWICE DAILY 06/02/20   Jearld Fenton, NP  predniSONE (DELTASONE) 10 MG tablet Take 4 tablets (40 mg total) by mouth daily for 5 days. 06/22/20 06/27/20  Sharion Balloon, NP  predniSONE (DELTASONE) 5 MG tablet TAKE 1 TABLET(5 MG) BY MOUTH DAILY WITH BREAKFAST 06/02/20   Jearld Fenton, NP    Family History Family History  Problem Relation Age of Onset  . Arthritis Father   . Diabetes Sister   . Diabetes Daughter   . Cancer Neg Hx   . Heart disease Neg Hx   . Stroke Neg Hx     Social History Social History   Tobacco Use  . Smoking status: Former Smoker    Packs/day: 1.50    Years: 30.00    Pack years: 45.00    Types: Cigarettes    Quit date: 11/15/1987    Years since quitting: 32.6  . Smokeless tobacco: Never Used  Vaping Use  . Vaping Use: Never used  Substance Use Topics  . Alcohol use: No    Alcohol/week: 0.0 standard drinks  . Drug use: No     Allergies   Patient has no known allergies.   Review of Systems Review of Systems  Constitutional: Negative for chills and fever.  HENT: Positive for congestion and postnasal drip. Negative for ear pain and sore throat.   Eyes: Negative for pain and visual disturbance.  Respiratory: Positive for cough. Negative for shortness of breath.   Cardiovascular: Negative for chest pain and palpitations.  Gastrointestinal: Negative for abdominal pain and vomiting.  Genitourinary: Negative for dysuria and hematuria.  Musculoskeletal: Negative for arthralgias and back pain.  Skin: Negative for color change and rash.  Neurological: Negative for seizures and syncope.  All other systems reviewed and are negative.    Physical Exam Triage Vital Signs ED Triage Vitals  Enc Vitals Group     BP 06/22/20 1359 (!) 145/82     Pulse Rate 06/22/20 1359 87     Resp 06/22/20 1359 19     Temp 06/22/20 1359 98.6 F (37 C)     Temp src --      SpO2 06/22/20 1359 95 %     Weight --      Height --      Head Circumference --      Peak Flow --      Pain Score 06/22/20 1356 2     Pain Loc --      Pain Edu? --      Excl. in [place]? --    No data found.  Updated Vital Signs BP (!) 145/82   Pulse 87   Temp 98.6 F (37 C)   Resp 19   SpO2 95%   Visual Acuity Right Eye Distance:   Left Eye Distance:     Bilateral Distance:    Right Eye Near:   Left Eye Near:    Bilateral Near:     Physical Exam Vitals and nursing note reviewed.  Constitutional:      General: She is not in acute distress.    Appearance: She is well-developed.  HENT:     Head: Normocephalic and atraumatic.     Right Ear: Tympanic membrane normal.     Left Ear: Tympanic membrane normal.     Nose: Rhinorrhea present.     Mouth/Throat:     Mouth: Mucous membranes are moist.     Pharynx: Oropharynx is clear.  Eyes:     Conjunctiva/sclera: Conjunctivae normal.  Cardiovascular:     Rate and Rhythm: Normal rate and regular rhythm.     Heart sounds: No murmur heard.   Pulmonary:     Effort: Pulmonary effort is normal. No respiratory distress.     Breath sounds: Normal breath sounds. No wheezing or rhonchi.  Abdominal:     Palpations: Abdomen is soft.     Tenderness: There is no abdominal tenderness. There is no guarding or rebound.  Musculoskeletal:     Cervical back: Neck supple.  Skin:    General: Skin is warm and dry.     Findings: No rash.  Neurological:     General: No focal deficit present.     Mental Status: She is alert and oriented to person, place, and time.     Gait: Gait normal.  Psychiatric:        Mood and Affect: Mood normal.        Behavior: Behavior normal.      UC Treatments / Results  Labs (all labs ordered are listed, but only abnormal results are displayed) Labs Reviewed  NOVEL CORONAVIRUS, NAA    EKG   Radiology No results found.  Procedures Procedures (including critical care time)  Medications Ordered in UC Medications - No data to display  Initial Impression / Assessment and Plan / UC Course  I have reviewed the triage vital signs and the nursing notes.  Pertinent labs & imaging results that were available during my care of the patient were reviewed by me and considered in my medical decision making (see chart for details).   Acute sinusitis.  Patient has  history of COPD.  PCR COVID pending.  Instructed patient to self quarantine until the test result is back.  Instructed her to continue her current COPD treatment, including nebulizers.  Also treating with prednisone and amoxicillin.  Instructed her to follow-up with her PCP if her symptoms or not improving.  Patient agrees to plan of care.   Final Clinical Impressions(s) / UC Diagnoses   Final diagnoses:  Acute non-recurrent maxillary sinusitis     Discharge Instructions     Take the amoxicillin and prednisone as directed.    Follow up with your primary care provider if your symptoms are not improving.       ED Prescriptions    Medication Sig Dispense Auth. Provider   predniSONE (DELTASONE) 10 MG tablet Take 4 tablets (40 mg total) by mouth daily for 5 days. 20 tablet Sharion Balloon, NP   amoxicillin (AMOXIL) 875 MG tablet Take 1 tablet (875 mg total) by mouth 2 (two) times daily for 7 days. 14 tablet Sharion Balloon, NP     PDMP not reviewed this encounter.   Sharion Balloon, NP 06/22/20 1423

## 2020-06-23 DIAGNOSIS — S62367A Nondisplaced fracture of neck of fifth metacarpal bone, left hand, initial encounter for closed fracture: Secondary | ICD-10-CM | POA: Diagnosis not present

## 2020-06-24 LAB — SARS-COV-2, NAA 2 DAY TAT

## 2020-06-24 LAB — NOVEL CORONAVIRUS, NAA: SARS-CoV-2, NAA: NOT DETECTED

## 2020-06-25 ENCOUNTER — Other Ambulatory Visit: Payer: Self-pay | Admitting: Internal Medicine

## 2020-07-14 ENCOUNTER — Other Ambulatory Visit: Payer: Self-pay | Admitting: Internal Medicine

## 2020-07-31 ENCOUNTER — Telehealth: Payer: Self-pay | Admitting: Pulmonary Disease

## 2020-07-31 MED ORDER — PREDNISONE 10 MG (21) PO TBPK
ORAL_TABLET | ORAL | 0 refills | Status: DC
Start: 2020-07-31 — End: 2020-08-14

## 2020-07-31 NOTE — Telephone Encounter (Signed)
Patient is aware of below message and voiced her understanding.  Rx for prednisone has been sent to preferred pharmacy.  Nothing further is needed at this time.

## 2020-07-31 NOTE — Telephone Encounter (Signed)
Called and spoke to patient. Patient is concerned that trelegy is causing a cough and urination problems.  Cough is occ productive with thick yellow mucus. Cough has been present for 3-90mo, however it has worsen over the past month.  Patient is taking delsym and albuterol nebulizer BID.  Patient is requesting a sooner appointment then October.  I have offered sooner appointment in [place] with NP. Patient declined appt.  Patient is requesting a Rx for prednisone, as this has helped with symptoms previously.    Dr. Patsey Berthold, please advise. Thanks

## 2020-07-31 NOTE — Telephone Encounter (Signed)
Recommend holding Trelegy for now.  Use her DuoNeb at least 3 times a day.  We will send a prednisone taper pack number 21 tablets, take as directed.

## 2020-08-12 ENCOUNTER — Other Ambulatory Visit: Payer: Self-pay | Admitting: Pulmonary Disease

## 2020-08-12 ENCOUNTER — Telehealth: Payer: Self-pay

## 2020-08-12 NOTE — Telephone Encounter (Signed)
ONO reviewed by Dr. Patsey Berthold- no need for nighttime oxygen. ATC- no answer with no option to leave vm. Will call back.

## 2020-08-13 NOTE — Telephone Encounter (Signed)
Patient is aware of results and voiced her understanding.  Nothing further needed.   

## 2020-08-14 ENCOUNTER — Ambulatory Visit
Admission: RE | Admit: 2020-08-14 | Discharge: 2020-08-14 | Disposition: A | Discharge status: Home / Self Care | Payer: Medicare Other | Source: Ambulatory Visit | Attending: Family Medicine | Admitting: Family Medicine

## 2020-08-14 ENCOUNTER — Ambulatory Visit
Admission: EM | Admit: 2020-08-14 | Discharge: 2020-08-14 | Disposition: A | Discharge status: Home / Self Care | Payer: Medicare Other | Attending: Family Medicine | Admitting: Family Medicine

## 2020-08-14 ENCOUNTER — Encounter: Payer: Self-pay | Admitting: Emergency Medicine

## 2020-08-14 ENCOUNTER — Ambulatory Visit
Admission: RE | Admit: 2020-08-14 | Discharge: 2020-08-14 | Disposition: A | Discharge status: Home / Self Care | Payer: Medicare Other | Attending: Family Medicine | Admitting: Family Medicine

## 2020-08-14 ENCOUNTER — Other Ambulatory Visit: Payer: Self-pay

## 2020-08-14 DIAGNOSIS — R5383 Other fatigue: Secondary | ICD-10-CM | POA: Diagnosis not present

## 2020-08-14 DIAGNOSIS — R059 Cough, unspecified: Secondary | ICD-10-CM | POA: Diagnosis not present

## 2020-08-14 DIAGNOSIS — J441 Chronic obstructive pulmonary disease with (acute) exacerbation: Secondary | ICD-10-CM

## 2020-08-14 DIAGNOSIS — R0602 Shortness of breath: Secondary | ICD-10-CM

## 2020-08-14 DIAGNOSIS — J449 Chronic obstructive pulmonary disease, unspecified: Secondary | ICD-10-CM | POA: Insufficient documentation

## 2020-08-14 DIAGNOSIS — J90 Pleural effusion, not elsewhere classified: Secondary | ICD-10-CM | POA: Diagnosis not present

## 2020-08-14 MED ORDER — DOXYCYCLINE HYCLATE 100 MG PO CAPS
100.0000 mg | ORAL_CAPSULE | Freq: Two times a day (BID) | ORAL | 0 refills | Status: DC
Start: 2020-08-14 — End: 2020-09-11

## 2020-08-14 MED ORDER — METHYLPREDNISOLONE SODIUM SUCC 125 MG IJ SOLR: 125.0000 mg | Freq: Once | INTRAMUSCULAR | Status: DC

## 2020-08-14 MED ORDER — PREDNISONE 10 MG (21) PO TBPK: ORAL_TABLET | Freq: Every day | ORAL | 0 refills | Status: AC

## 2020-08-14 NOTE — ED Provider Notes (Signed)
[place]   [id number] 08/14/20 Arrival Time: 4332   SUBJECTIVE:  Megan Bradshaw is a 86 y.o. female who presents with complaint of nasal congestion, post-nasal drainage, and a persistent cough. Onset abrupt approximately 2 weeks ago. Overall fatigued. SOB: mild. Wheezing: moderate. Has negative history of Covid. Has  completed Covid vaccines. OTC treatment: uses nebulizer TID with little relief. Declines Covid testing today. Denies sick contacts. Denies nausea, vomiting, diarrhea, rash, fever, other symptoms. Social History   Tobacco Use  Smoking Status Former Smoker  . Packs/day: 1.50  . Years: 30.00  . Pack years: 45.00  . Types: Cigarettes  . Quit date: 11/15/1987  . Years since quitting: 32.7  Smokeless Tobacco Never Used    ROS: As per HPI.   OBJECTIVE:  Vitals:   08/14/20 1221  BP: (!) 154/86  Pulse: 78  Resp: 20  Temp: 98.2 F (36.8 C)  TempSrc: Oral  SpO2: 95%     General appearance: alert; no distress HEENT: nasal congestion; clear runny nose; throat irritation secondary to post-nasal drainage Neck: supple without LAD Lungs: unclear to auscultation bilaterally Skin: warm and dry Psychological: alert and cooperative; normal mood and affect  Results for orders placed or performed during the hospital encounter of 06/22/20  Novel Coronavirus, NAA ([facility])   Specimen: Nasal Swab; Nasopharyngeal(NP) swabs in vial transport medium   Nasopharynge  Patient  Result Value Ref Range   SARS-CoV-2, NAA Not Detected Not Detected  SARS-COV-2, NAA 2 DAY TAT   Nasopharynge  Patient  Result Value Ref Range   SARS-CoV-2, NAA 2 DAY TAT Performed     Labs Reviewed - No data to display  No results found.  No Known Allergies  Past Medical History:  Diagnosis Date  . Arthritis   . Asthma   . COPD (chronic obstructive pulmonary disease) ([place])   . Diabetes ([place])   . Thyroid disease    Social History   Socioeconomic History  . Marital status:  Widowed    Spouse name: Not on file  . Number of children: Not on file  . Years of education: Not on file  . Highest education level: Not on file  Occupational History  . Occupation: Retired  Tobacco Use  . Smoking status: Former Smoker    Packs/day: 1.50    Years: 30.00    Pack years: 45.00    Types: Cigarettes    Quit date: 11/15/1987    Years since quitting: 32.7  . Smokeless tobacco: Never Used  Vaping Use  . Vaping Use: Never used  Substance and Sexual Activity  . Alcohol use: No    Alcohol/week: 0.0 standard drinks  . Drug use: No  . Sexual activity: Not on file  Other Topics Concern  . Not on file  Social History Narrative  . Not on file   Social Determinants of Health   Financial Resource Strain:   . Difficulty of Paying Living Expenses: Not on file  Food Insecurity:   . Worried About Charity fundraiser in the Last Year: Not on file  . Ran Out of Food in the Last Year: Not on file  Transportation Needs:   . Lack of Transportation (Medical): Not on file  . Lack of Transportation (Non-Medical): Not on file  Physical Activity:   . Days of Exercise per Week: Not on file  . Minutes of Exercise per Session: Not on file  Stress:   . Feeling of Stress : Not on file  Social Connections:   .  Frequency of Communication with Friends and Family: Not on file  . Frequency of Social Gatherings with Friends and Family: Not on file  . Attends Religious Services: Not on file  . Active Member of Clubs or Organizations: Not on file  . Attends Archivist Meetings: Not on file  . Marital Status: Not on file  Intimate Partner Violence:   . Fear of Current or Ex-Partner: Not on file  . Emotionally Abused: Not on file  . Physically Abused: Not on file  . Sexually Abused: Not on file   Family History  Problem Relation Age of Onset  . Arthritis Father   . Diabetes Sister   . Diabetes Daughter   . Cancer Neg Hx   . Heart disease Neg Hx   . Stroke Neg Hx       ASSESSMENT & PLAN:  1. COPD exacerbation ([place])   2. Cough   3. Other fatigue   4. SOB (shortness of breath)     Meds ordered this encounter  Medications  . DISCONTD: methylPREDNISolone sodium succinate (SOLU-MEDROL) 125 mg/2 mL injection 125 mg  . predniSONE (STERAPRED UNI-PAK 21 TAB) 10 MG (21) TBPK tablet    Sig: Take by mouth daily for 6 days. Take 6 tablets on day 1, 5 tablets on day 2, 4 tablets on day 3, 3 tablets on day 4, 2 tablets on day 5, 1 tablet on day 6    Dispense:  21 tablet    Refill:  0    Order Specific Question:   Supervising Provider    Answer:   Chase Picket [id number]  . doxycycline (VIBRAMYCIN) 100 MG capsule    Sig: Take 1 capsule (100 mg total) by mouth 2 (two) times daily.    Dispense:  14 capsule    Refill:  0    Order Specific Question:   Supervising Provider    Answer:   Chase Picket [[id number]]    Will treat as COPD exacerbation  Xray negative Prescribed steroid taper Prescribed doxycycline Solumedrol 125mg  IM in office today OTC symptom care as needed. Will plan f/u with PCP or here as needed.  Reviewed expectations re: course of current medical issues. Questions answered. Outlined signs and symptoms indicating need for more acute intervention. Patient verbalized understanding. After Visit Summary given.           Faustino Congress, NP 08/15/20 1035

## 2020-08-14 NOTE — ED Triage Notes (Signed)
Pt c/o chronic cough this onset about 2 weeks ago. She has copd and states she is "tired of coughing" She states her doctor recently gave her Prednisone 5 mg which helped. She declines covid testing today.

## 2020-08-14 NOTE — Discharge Instructions (Addendum)
I have sent in doxycycline for you to take twice a day for a week  I have sent in a prednisone taper for you to take for 6 days. 6 tablets on day one, 5 tablets on day two, 4 tablets on day three, 3 tablets on day four, 2 tablets on day five, and 1 tablet on day six.  You have also received an injection of steroid in the office today.  Do not take your prednisone today, restart this again tomorrow.  Follow-up with primary care with this office if symptoms are persisting  Follow-up in the ER for trouble swallowing, trouble breathing, other concerning symptoms

## 2020-08-28 ENCOUNTER — Other Ambulatory Visit: Payer: Self-pay | Admitting: Pulmonary Disease

## 2020-09-03 ENCOUNTER — Ambulatory Visit (INDEPENDENT_AMBULATORY_CARE_PROVIDER_SITE_OTHER): Payer: Medicare Other | Admitting: Adult Health

## 2020-09-03 ENCOUNTER — Other Ambulatory Visit: Payer: Self-pay

## 2020-09-03 ENCOUNTER — Other Ambulatory Visit: Payer: Self-pay | Admitting: Internal Medicine

## 2020-09-03 ENCOUNTER — Other Ambulatory Visit
Admission: RE | Admit: 2020-09-03 | Discharge: 2020-09-03 | Disposition: A | Discharge status: Home / Self Care | Payer: Medicare Other | Attending: Adult Health | Admitting: Adult Health

## 2020-09-03 ENCOUNTER — Encounter: Payer: Self-pay | Admitting: Adult Health

## 2020-09-03 VITALS — BP 122/82 | HR 88 | Temp 96.6°F | Ht 64.0 in | Wt 190.0 lb

## 2020-09-03 DIAGNOSIS — J302 Other seasonal allergic rhinitis: Secondary | ICD-10-CM

## 2020-09-03 DIAGNOSIS — J449 Chronic obstructive pulmonary disease, unspecified: Secondary | ICD-10-CM

## 2020-09-03 DIAGNOSIS — K219 Gastro-esophageal reflux disease without esophagitis: Secondary | ICD-10-CM | POA: Diagnosis not present

## 2020-09-03 DIAGNOSIS — R053 Chronic cough: Secondary | ICD-10-CM | POA: Diagnosis not present

## 2020-09-03 DIAGNOSIS — Z23 Encounter for immunization: Secondary | ICD-10-CM | POA: Diagnosis not present

## 2020-09-03 DIAGNOSIS — R634 Abnormal weight loss: Secondary | ICD-10-CM

## 2020-09-03 DIAGNOSIS — R0602 Shortness of breath: Secondary | ICD-10-CM

## 2020-09-03 LAB — CBC WITH DIFFERENTIAL/PLATELET
Abs Immature Granulocytes: 0.04 10*3/uL (ref 0.00–0.07)
Basophils Absolute: 0.1 10*3/uL (ref 0.0–0.1)
Basophils Relative: 1 %
Eosinophils Absolute: 0.1 10*3/uL (ref 0.0–0.5)
Eosinophils Relative: 1 %
HCT: 41.7 % (ref 36.0–46.0)
Hemoglobin: 14.2 g/dL (ref 12.0–15.0)
Immature Granulocytes: 1 %
Lymphocytes Relative: 14 %
Lymphs Abs: 1.2 10*3/uL (ref 0.7–4.0)
MCH: 31.2 pg (ref 26.0–34.0)
MCHC: 34.1 g/dL (ref 30.0–36.0)
MCV: 91.6 fL (ref 80.0–100.0)
Monocytes Absolute: 0.5 10*3/uL (ref 0.1–1.0)
Monocytes Relative: 5 %
Neutro Abs: 6.7 10*3/uL (ref 1.7–7.7)
Neutrophils Relative %: 78 %
Platelets: 274 10*3/uL (ref 150–400)
RBC: 4.55 MIL/uL (ref 3.87–5.11)
RDW: 12.9 % (ref 11.5–15.5)
WBC: 8.6 10*3/uL (ref 4.0–10.5)
nRBC: 0 % (ref 0.0–0.2)

## 2020-09-03 LAB — COMPREHENSIVE METABOLIC PANEL
ALT: 27 U/L (ref 0–44)
AST: 26 U/L (ref 15–41)
Albumin: 4 g/dL (ref 3.5–5.0)
Alkaline Phosphatase: 52 U/L (ref 38–126)
Anion gap: 11 (ref 5–15)
BUN: 19 mg/dL (ref 8–23)
CO2: 25 mmol/L (ref 22–32)
Calcium: 9.8 mg/dL (ref 8.9–10.3)
Chloride: 99 mmol/L (ref 98–111)
Creatinine, Ser: 0.87 mg/dL (ref 0.44–1.00)
GFR, Estimated: >60 mL/min (ref >60)
Glucose, Bld: 253 mg/dL — ABNORMAL HIGH (ref 70–99)
Potassium: 4.9 mmol/L (ref 3.5–5.1)
Sodium: 135 mmol/L (ref 135–145)
Total Bilirubin: 0.6 mg/dL (ref 0.3–1.2)
Total Protein: 7.2 g/dL (ref 6.5–8.1)

## 2020-09-03 LAB — BRAIN NATRIURETIC PEPTIDE: B Natriuretic Peptide: 32.9 pg/mL (ref 0.0–100.0)

## 2020-09-03 MED ORDER — BENZONATATE 200 MG PO CAPS
200.0000 mg | ORAL_CAPSULE | Freq: Three times a day (TID) | ORAL | 1 refills | Status: AC | PRN
Start: 2020-09-03 — End: 2021-09-03

## 2020-09-03 MED ORDER — BREO ELLIPTA 100-25 MCG/INH IN AEPB: 1.0000 | INHALATION_SPRAY | Freq: Every day | RESPIRATORY_TRACT | 3 refills | Status: DC

## 2020-09-03 MED ORDER — BENZONATATE 200 MG PO CAPS: 200.0000 mg | ORAL_CAPSULE | Freq: Three times a day (TID) | ORAL | 0 refills | Status: DC | PRN

## 2020-09-03 MED ORDER — BREO ELLIPTA 100-25 MCG/INH IN AEPB: 1.0000 | INHALATION_SPRAY | Freq: Every day | RESPIRATORY_TRACT | 3 refills | Status: AC

## 2020-09-03 NOTE — Assessment & Plan Note (Signed)
Add Prilosec and Pepcid.

## 2020-09-03 NOTE — Assessment & Plan Note (Addendum)
Mild flare add Claritin

## 2020-09-03 NOTE — Patient Instructions (Addendum)
Restart BREO 1 puff daily -brush/rinse/gargle Continue on Duoneb Three times a day  .  Mucinex Liquid Twice daily  As needed  Cough/congestion.  Delsym 2 tsp Twice daily  For cough As needed   Tessalon Perles 200mg  Three times a day for cough As needed   Begin Claritin 10mg  At bedtime.  Begin Saline nasal spray Twice daily   Continue on Prednisone 5mg  daily  Labs today.  Begin Prilosec 20mg  daily in am  Begin Pepcid 20mg  At bedtime   Flu shot today .  Sputum Culture today .  Follow up with Dr. Patsey Berthold or APP in 3-4 weeks and As needed   Please contact office for sooner follow up if symptoms do not improve or worsen or seek emergency care

## 2020-09-03 NOTE — Assessment & Plan Note (Signed)
Slowly resolving COPD exacerbation. Check lab work today including a BNP. Restart maintenance inhaler.  Patient was previously on Breo and felt like that she did okay on this.  We will continue on bronchodilator nebulizers with DuoNeb. Control for triggers including chronic rhinitis and GERD Check sputum culture today. Add cough control measures. Close follow-up.  Plan  Patient Instructions  Restart BREO 1 puff daily -brush/rinse/gargle Continue on Duoneb Three times a day  .  Mucinex Liquid Twice daily  As needed  Cough/congestion.  Delsym 2 tsp Twice daily  For cough As needed   Tessalon Perles 200mg  Three times a day for cough As needed   Begin Claritin 10mg  At bedtime.  Begin Saline nasal spray Twice daily   Continue on Prednisone 5mg  daily  Labs today.  Begin Prilosec 20mg  daily in am  Begin Pepcid 20mg  At bedtime   Flu shot today .  Sputum Culture today .  Follow up with Dr. Patsey Berthold or APP in 3-4 weeks and As needed   Please contact office for sooner follow up if symptoms do not improve or worsen or seek emergency care

## 2020-09-03 NOTE — Progress Notes (Signed)
@Patient  ID: Megan Bradshaw, female    DOB: [birth year], 86 y.o.   MRN: [id number]  Chief Complaint  Patient presents with  . Follow-up    COPD    Referring provider: Jearld Fenton, NP  HPI: 86 year old female former smoker followed for COPD with asthma, chronic cough.   On chronic steroids Previously from [place]  TEST/EVENTS :  10/22/14 PFTs:mild obstruction, FEV1 1.36 L (81%), FEV1/FVC 53%, TLC normal, DLCO 51% pred  02/16/16 PFTs:FVC: 2.23 L (90 %pred), FEV1: 1.30 L (71 %pred), FEV1/FVC: 58% , TLC: invalid, DLCO 78% pred, consistent with mild/moderate obstructive defect.  09/03/2020 Follow up : COPD /Asthma  Patient presents for a 3-month follow-up.  Patient says that she has not been doing as well with her breathing.  She had previously been changed from [facility] to Trelegy but did not feel like that Trelegy was working that well.  Then Trelegy was stopped and now the feels like her breathing has worsened.  She was recently seen in urgent care for COPD exacerbation. She was given doxycycline and a prednisone taper.  Patient says she is feeling some better.  But is very frustrated that her breathing has not been doing as well.  Chest x-ray showed no acute process.  She denies any hemoptysis chest pain orthopnea PND or increased leg swelling. Currently is doing DuoNeb 3 times daily. Patient says she does try to be active but gets short of breath easily. O2 saturations are 98% on room air Says she does have some drippy nose and heartburn.  Complains of this cough has been getting worse and is keeping her awake.  She describes it as a rattly and barking cough .     No Known Allergies  Immunization History  Administered Date(s) Administered  . Fluad Quad(high Dose 65+) 09/03/2020  . Influenza,inj,Quad PF,6+ Mos 08/20/2015, 07/22/2016, 08/14/2017, 08/23/2018, 08/29/2019  . Influenza-Unspecified 08/14/2014  . PFIZER SARS-COV-2 Vaccination 01/07/2020, 01/28/2020  . Pneumococcal  Conjugate-13 08/20/2015  . Pneumococcal Polysaccharide-23 08/29/2019    Past Medical History:  Diagnosis Date  . Arthritis   . Asthma   . COPD (chronic obstructive pulmonary disease) ([place])   . Diabetes ([place])   . Thyroid disease     Tobacco History: Social History   Tobacco Use  Smoking Status Former Smoker  . Packs/day: 1.50  . Years: 30.00  . Pack years: 45.00  . Types: Cigarettes  . Quit date: 11/15/1987  . Years since quitting: 32.8  Smokeless Tobacco Never Used   Counseling given: Not Answered   Outpatient Medications Prior to Visit  Medication Sig Dispense Refill  . albuterol (PROVENTIL) (2.5 MG/3ML) 0.083% nebulizer solution Take 3 mLs (2.5 mg total) by nebulization every 4 (four) hours as needed for wheezing or shortness of breath. 150 mL 5  . albuterol (VENTOLIN HFA) 108 (90 Base) MCG/ACT inhaler INHALE 1 TO 2 PUFFS BY MOUTH EVERY 6 HOURS FOR PAIN OR WHEEZING OR SHORTNESS OF BREATH 18 g 1  . Cholecalciferol (VITAMIN D PO) Take 1 tablet by mouth daily.    Marland Kitchen gabapentin (NEURONTIN) 100 MG capsule TAKE 1 CAPSULE(100 MG) BY MOUTH THREE TIMES DAILY 90 capsule 3  . glipiZIDE (GLUCOTROL XL) 10 MG 24 hr tablet TAKE 1 TABLET(10 MG) BY MOUTH DAILY WITH BREAKFAST 90 tablet 0  . ipratropium-albuterol (DUONEB) 0.5-2.5 (3) MG/3ML SOLN USE 3 ML VIA NEBULIZER EVERY 6 HOURS AS NEEDED 360 mL 1  . levothyroxine (SYNTHROID) 75 MCG tablet TAKE 1 TABLET BY MOUTH EVERY DAY BEFORE  BREAKFAST 90 tablet 0  . metFORMIN (GLUCOPHAGE) 500 MG tablet Take 1 tablet (500 mg total) by mouth daily with breakfast. 180 tablet 0  . Multiple Vitamin (MULTIVITAMIN) capsule Take 1 capsule by mouth daily.    . predniSONE (DELTASONE) 5 MG tablet TAKE 1 TABLET(5 MG) BY MOUTH DAILY WITH BREAKFAST 90 tablet 0  . clobetasol cream (TEMOVATE) 4.74 % Apply 1 application topically daily as needed. (Patient not taking: Reported on 09/03/2020)    . doxycycline (VIBRAMYCIN) 100 MG capsule Take 1 capsule (100 mg total) by  mouth 2 (two) times daily. (Patient not taking: Reported on 09/03/2020) 14 capsule 0  . fluticasone (FLONASE) 50 MCG/ACT nasal spray Place 1 spray into both nostrils daily. (Patient not taking: Reported on 09/03/2020) 16 g 10  . Fluticasone-Umeclidin-Vilant (TRELEGY ELLIPTA) 100-62.5-25 MCG/INH AEPB Inhale 1 puff into the lungs daily. (Patient not taking: Reported on 09/03/2020) 180 each 2  . HYDROcodone-homatropine (HYCODAN) 5-1.5 MG/5ML syrup Take 5 mLs by mouth every 8 (eight) hours as needed for cough. (Patient not taking: Reported on 09/03/2020) 120 mL 0  . NYSTATIN powder APPLY EXTERNALLY TO THE AFFECTED AREA TWICE DAILY (Patient not taking: Reported on 09/03/2020) 30 g 0   No facility-administered medications prior to visit.     Review of Systems:   Constitutional:   No  weight loss, night sweats,  Fevers, chills,  +fatigue, or  lassitude.  HEENT:   No headaches,  Difficulty swallowing,  Tooth/dental problems, or  Sore throat,                No sneezing, itching, ear ache, +nasal congestion, post nasal drip,   CV:  No chest pain,  Orthopnea, PND, swelling in lower extremities, anasarca, dizziness, palpitations, syncope.   GI  No heartburn, indigestion, abdominal pain, nausea, vomiting, diarrhea, change in bowel habits, loss of appetite, bloody stools.   Resp:    No chest wall deformity  Skin: no rash or lesions.  GU: no dysuria, change in color of urine, no urgency or frequency.  No flank pain, no hematuria   MS:  No joint pain or swelling.  No decreased range of motion.  No back pain.    Physical Exam  BP 122/82 (BP Location: Left Arm, Patient Position: Sitting, Cuff Size: Normal)   Pulse 88   Temp (!) 96.6 F (35.9 C) (Temporal)   Ht 5\' 4"  (1.626 m)   Wt 190 lb (86.2 kg)   SpO2 98%   BMI 32.61 kg/m   GEN: A/Ox3; pleasant , NAD, well nourished    HEENT:  [place]/AT,   NOSE-clear, THROAT-clear, no lesions, no postnasal drip or exudate noted.   NECK:  Supple w/ fair  ROM; no JVD; normal carotid impulses w/o bruits; no thyromegaly or nodules palpated; no lymphadenopathy.    RESP few scattered rhonchi , no accessory muscle use, no dullness to percussion  CARD:  RRR, no m/r/g, no peripheral edema, pulses intact, no cyanosis or clubbing.  GI:   Soft & nt; nml bowel sounds; no organomegaly or masses detected.   Musco: Warm bil, no deformities or joint swelling noted.   Neuro: alert, no focal deficits noted.    Skin: Warm, no lesions or rashes    Lab Results:  CBC  No results found for: PROBNP  Imaging: DG Chest 2 View  Result Date: 08/14/2020 CLINICAL DATA:  Cough, shortness of breath. EXAM: CHEST - 2 VIEW COMPARISON:  February 18, 2020. FINDINGS: The heart size and mediastinal contours  are within normal limits. No pneumothorax or pleural effusion is noted. Stable right apical and bibasilar scarring is noted. No definite acute abnormality is noted. The visualized skeletal structures are unremarkable. IMPRESSION: No active cardiopulmonary disease. Aortic Atherosclerosis (ICD10-I70.0). Electronically Signed   By: Marijo Conception M.D.   On: 08/14/2020 14:27      PFT Results Latest Ref Rng & Units 02/16/2016 10/22/2014  FVC-Pre L 2.23 2.39  FVC-Predicted Pre % 90 105  FVC-Post L 2.40 2.45  FVC-Predicted Post % 97 107  Pre FEV1/FVC % % 58 57  Post FEV1/FCV % % 60 57  FEV1-Pre L 1.30 1.36  FEV1-Predicted Pre % 71 81  FEV1-Post L 1.43 1.40  DLCO uncorrected ml/min/mmHg 19.08 11.04  DLCO UNC% % 78 51  DLVA Predicted % 48 59  TLC L - 4.68  TLC % Predicted % - 98  RV % Predicted % - 78    No results found for: NITRICOXIDE      Assessment & Plan:   COPD GOLD I Slowly resolving COPD exacerbation. Check lab work today including a BNP. Restart maintenance inhaler.  Patient was previously on Breo and felt like that she did okay on this.  We will continue on bronchodilator nebulizers with DuoNeb. Control for triggers including chronic rhinitis and  GERD Check sputum culture today. Add cough control measures. Close follow-up.  Plan  Patient Instructions  Restart BREO 1 puff daily -brush/rinse/gargle Continue on Duoneb Three times a day  .  Mucinex Liquid Twice daily  As needed  Cough/congestion.  Delsym 2 tsp Twice daily  For cough As needed   Tessalon Perles 200mg  Three times a day for cough As needed   Begin Claritin 10mg  At bedtime.  Begin Saline nasal spray Twice daily   Continue on Prednisone 5mg  daily  Labs today.  Begin Prilosec 20mg  daily in am  Begin Pepcid 20mg  At bedtime   Flu shot today .  Sputum Culture today .  Follow up with Dr. Patsey Berthold or APP in 3-4 weeks and As needed   Please contact office for sooner follow up if symptoms do not improve or worsen or seek emergency care              Seasonal allergies Mild flare add Claritin  GERD (gastroesophageal reflux disease) Add Prilosec and Pepcid.     Rexene Edison, NP 09/03/2020

## 2020-09-03 NOTE — Progress Notes (Signed)
Agree with the details of the procedure as noted by Tammy Parrett, NP.  C. Laura Haleem Hanner, MD [facility] 

## 2020-09-04 ENCOUNTER — Other Ambulatory Visit: Payer: Self-pay | Admitting: Pulmonary Disease

## 2020-09-08 ENCOUNTER — Telehealth: Payer: Self-pay | Admitting: Adult Health

## 2020-09-08 NOTE — Progress Notes (Signed)
ATC patient x2, no answer, no vm.

## 2020-09-08 NOTE — Telephone Encounter (Signed)
Pt will not be in until after 430pm

## 2020-09-08 NOTE — Telephone Encounter (Signed)
Will call pt after 4:30 per her request  Per TP:   Melvenia Needles, NP  09/03/2020 4:37 PM EDT     Labs showed a normal congestive heart failure marker Your CBC showed normal white blood cell count and hemoglobin and hematocrit. There was no sign of anemia. Your electrolyte panel was good except for your blood sugar was high at 253 please follow-up with your primary care provider for your diabetes  Continue with office visit recommendations and follow-up

## 2020-09-09 NOTE — Telephone Encounter (Signed)
Tried calling the pt and there was no answer and no VM set up

## 2020-09-09 NOTE — Telephone Encounter (Signed)
Pt aware of results by Rexene Edison, NP  She verbalized understanding  Copy of results routed to PCP

## 2020-09-10 ENCOUNTER — Encounter: Payer: Self-pay | Admitting: *Deleted

## 2020-09-10 NOTE — Progress Notes (Signed)
Unable to reach letter mailed.  Nothing further needed.

## 2020-09-10 NOTE — Progress Notes (Signed)
ATCx3, no VM set up.  Closing encounter per protocol.

## 2020-09-11 ENCOUNTER — Other Ambulatory Visit: Payer: Self-pay

## 2020-09-11 ENCOUNTER — Ambulatory Visit (INDEPENDENT_AMBULATORY_CARE_PROVIDER_SITE_OTHER): Payer: Medicare Other | Admitting: Family Medicine

## 2020-09-11 ENCOUNTER — Encounter: Payer: Self-pay | Admitting: Family Medicine

## 2020-09-11 VITALS — BP 158/78 | HR 99 | Temp 98.0°F | Ht 64.0 in | Wt 191.8 lb

## 2020-09-11 DIAGNOSIS — T380X5D Adverse effect of glucocorticoids and synthetic analogues, subsequent encounter: Secondary | ICD-10-CM | POA: Diagnosis not present

## 2020-09-11 DIAGNOSIS — E099 Drug or chemical induced diabetes mellitus without complications: Secondary | ICD-10-CM

## 2020-09-11 LAB — POCT GLYCOSYLATED HEMOGLOBIN (HGB A1C): Hemoglobin A1C: 8.9 % — AB (ref 4.0–5.6)

## 2020-09-11 LAB — GLUCOSE, POCT (MANUAL RESULT ENTRY): POC Glucose: 354 mg/dl — AB (ref 70–99)

## 2020-09-11 NOTE — Patient Instructions (Addendum)
Work on low [employer] as discussed, increase water.   Increase metformin to twice daily.    Stop sugar in coffee!

## 2020-09-11 NOTE — Progress Notes (Signed)
Chief Complaint  Patient presents with  . Hyperglycemia    History of Present Illness: HPI    86 year old female patient of Rollene Fare Baity's with history of steroid induced diabetes mellitus presents with recent hyperglycemia.  She asthma and COPD... had blood work and non-fasting ( had eaten 2 hours earlier).. CBG 253.  She is on daily prednisone long term.  She is on metformin 500 mg daily and glipizide max Lab Results  Component Value Date   HGBA1C 7.7 (A) 03/30/2020    GFR > 60.   She eats cereal, toast ,pears. Teaspoon of sugar in  Coffee this Am with 2 pieces of toast.  Taco. This visit occurred during the SARS-CoV-2 public health emergency.  Safety protocols were in place, including screening questions prior to the visit, additional usage of staff PPE, and extensive cleaning of exam room while observing appropriate contact time as indicated for disinfecting solutions.   COVID 19 screen:  No recent travel or known exposure to COVID19 The patient denies respiratory symptoms of COVID 19 at this time. The importance of social distancing was discussed today.     Review of Systems  Constitutional: Negative for chills and fever.  HENT: Negative for congestion and ear pain.   Eyes: Negative for pain and redness.  Respiratory: Negative for cough and shortness of breath.   Cardiovascular: Negative for chest pain, palpitations and leg swelling.  Gastrointestinal: Negative for abdominal pain, blood in stool, constipation, diarrhea, nausea and vomiting.  Genitourinary: Negative for dysuria.  Musculoskeletal: Negative for falls and myalgias.  Skin: Negative for rash.  Neurological: Negative for dizziness.  Psychiatric/Behavioral: Negative for depression. The patient is not nervous/anxious.       Past Medical History:  Diagnosis Date  . Arthritis   . Asthma   . COPD (chronic obstructive pulmonary disease) ([place])   . Diabetes ([place])   . Thyroid disease     reports that she  quit smoking about 32 years ago. Her smoking use included cigarettes. She has a 45.00 pack-year smoking history. She has never used smokeless tobacco. She reports that she does not drink alcohol and does not use drugs.   Current Outpatient Medications:  .  albuterol (PROVENTIL) (2.5 MG/3ML) 0.083% nebulizer solution, Take 3 mLs (2.5 mg total) by nebulization every 4 (four) hours as needed for wheezing or shortness of breath., Disp: 150 mL, Rfl: 5 .  albuterol (VENTOLIN HFA) 108 (90 Base) MCG/ACT inhaler, INHALE 1 TO 2 PUFFS BY MOUTH EVERY 6 HOURS FOR PAIN OR WHEEZING OR SHORTNESS OF BREATH, Disp: 18 g, Rfl: 1 .  benzonatate (TESSALON) 200 MG capsule, Take 1 capsule (200 mg total) by mouth 3 (three) times daily as needed for cough., Disp: 30 capsule, Rfl: 1 .  Cholecalciferol (VITAMIN D PO), Take 1 tablet by mouth daily., Disp: , Rfl:  .  clobetasol cream (TEMOVATE) 6.23 %, Apply 1 application topically daily as needed. , Disp: , Rfl:  .  fluticasone furoate-vilanterol (BREO ELLIPTA) 100-25 MCG/INH AEPB, Inhale 1 puff into the lungs daily., Disp: 160 each, Rfl: 3 .  gabapentin (NEURONTIN) 100 MG capsule, TAKE 1 CAPSULE(100 MG) BY MOUTH THREE TIMES DAILY, Disp: 90 capsule, Rfl: 3 .  glipiZIDE (GLUCOTROL XL) 10 MG 24 hr tablet, TAKE 1 TABLET(10 MG) BY MOUTH DAILY WITH BREAKFAST, Disp: 90 tablet, Rfl: 0 .  ipratropium-albuterol (DUONEB) 0.5-2.5 (3) MG/3ML SOLN, USE 3 ML VIA NEBULIZER EVERY 6 HOURS AS NEEDED, Disp: 360 mL, Rfl: 1 .  levothyroxine (SYNTHROID)  75 MCG tablet, TAKE 1 TABLET BY MOUTH EVERY DAY BEFORE BREAKFAST, Disp: 90 tablet, Rfl: 0 .  metFORMIN (GLUCOPHAGE) 500 MG tablet, Take 1 tablet (500 mg total) by mouth daily with breakfast., Disp: 180 tablet, Rfl: 0 .  Multiple Vitamin (MULTIVITAMIN) capsule, Take 1 capsule by mouth daily., Disp: , Rfl:  .  NYSTATIN powder, APPLY EXTERNALLY TO THE AFFECTED AREA TWICE DAILY, Disp: 30 g, Rfl: 0 .  predniSONE (DELTASONE) 5 MG tablet, TAKE 1 TABLET(5 MG)  BY MOUTH DAILY WITH BREAKFAST, Disp: 90 tablet, Rfl: 0   Observations/Objective: Blood pressure (!) 158/78, pulse 99, temperature 98 F (36.7 C), temperature source Temporal, height 5\' 4"  (1.626 m), weight 191 lb 12 oz (87 kg), SpO2 96 %.  Physical Exam Constitutional:      General: She is not in acute distress.    Appearance: Normal appearance. She is well-developed. She is obese. She is not ill-appearing or toxic-appearing.  HENT:     Head: Normocephalic.     Right Ear: Hearing, tympanic membrane, ear canal and external ear normal. Tympanic membrane is not erythematous, retracted or bulging.     Left Ear: Hearing, tympanic membrane, ear canal and external ear normal. Tympanic membrane is not erythematous, retracted or bulging.     Nose: No mucosal edema or rhinorrhea.     Right Sinus: No maxillary sinus tenderness or frontal sinus tenderness.     Left Sinus: No maxillary sinus tenderness or frontal sinus tenderness.     Mouth/Throat:     Pharynx: Uvula midline.  Eyes:     General: Lids are normal. Lids are everted, no foreign bodies appreciated.     Conjunctiva/sclera: Conjunctivae normal.     Pupils: Pupils are equal, round, and reactive to light.  Neck:     Thyroid: No thyroid mass or thyromegaly.     Vascular: No carotid bruit.     Trachea: Trachea normal.  Cardiovascular:     Rate and Rhythm: Normal rate and regular rhythm.     Pulses: Normal pulses.     Heart sounds: Normal heart sounds, S1 normal and S2 normal. No murmur heard.  No friction rub. No gallop.   Pulmonary:     Effort: Pulmonary effort is normal. No tachypnea or respiratory distress.     Breath sounds: Normal breath sounds. No decreased breath sounds, wheezing, rhonchi or rales.  Abdominal:     General: Bowel sounds are normal.     Palpations: Abdomen is soft.     Tenderness: There is no abdominal tenderness.  Musculoskeletal:     Cervical back: Normal range of motion and neck supple.  Skin:    General:  Skin is warm and dry.     Findings: No rash.  Neurological:     Mental Status: She is alert.  Psychiatric:        Mood and Affect: Mood is not anxious or depressed.        Speech: Speech normal.        Behavior: Behavior normal. Behavior is cooperative.        Thought Content: Thought content normal.        Judgment: Judgment normal.      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 Poor diet.  Eval CBG today and A1C.   CBG.. 2 hour postprandial 354  A1C elevated.   Discussed diet changes.. Rx given for glucometer.  Take metformin 500 mg BID.  Diet  info given. Follow up in 2 weeks with measurements as well as in 3 months with PCP for A1C.    Eliezer Lofts, MD

## 2020-09-11 NOTE — Assessment & Plan Note (Addendum)
Inadequate control. Poor diet.  Eval CBG today and A1C.   CBG.. 2 hour postprandial 354  A1C elevated.   Discussed diet changes.. Rx given for glucometer.  Take metformin 500 mg BID.  Diet info given. Follow up in 2 weeks with measurements as well as in 3 months with PCP for A1C.

## 2020-09-24 ENCOUNTER — Other Ambulatory Visit: Payer: Self-pay

## 2020-09-24 ENCOUNTER — Ambulatory Visit (INDEPENDENT_AMBULATORY_CARE_PROVIDER_SITE_OTHER): Payer: Medicare Other | Admitting: Family Medicine

## 2020-09-24 ENCOUNTER — Encounter: Payer: Self-pay | Admitting: Family Medicine

## 2020-09-24 ENCOUNTER — Ambulatory Visit: Payer: Medicare Other | Admitting: Family Medicine

## 2020-09-24 DIAGNOSIS — T380X5D Adverse effect of glucocorticoids and synthetic analogues, subsequent encounter: Secondary | ICD-10-CM | POA: Diagnosis not present

## 2020-09-24 DIAGNOSIS — E099 Drug or chemical induced diabetes mellitus without complications: Secondary | ICD-10-CM | POA: Diagnosis not present

## 2020-09-24 NOTE — Patient Instructions (Addendum)
Continue to follow blood sugars 1-2 times daily for next few week   Follow up DM in 3 months with PCP.s.  Continue great work on low carb diet!

## 2020-09-24 NOTE — Assessment & Plan Note (Signed)
Improved control with taking meds correctly and getting back on trak with diet changes.  Continue current regimen as in 86 year old we do not want to be aggressive and have hypoglycemia. Follow up in 3 months.. can tolerate A1C up to 8.

## 2020-09-24 NOTE — Progress Notes (Signed)
Chief Complaint  Patient presents with  . Diabetes    History of Present Illness: HPI    86 year old female presents for 2 week follow up on worsening elevated blood sugars in DM.  At [facility] on  09/11/2020 CBGs were in the 200s-300s She is on daily longterm steroids. She was not taking her metformin as directed at the time. She was encouraged to take metformin 500 mg BID , and max glipizide and work on diet ( it was very poor) Rx was given for a glucometer.  Today she reports FBS  90-131 2 hour postprandial 159-213  She has been eating more low carb in last 2 weeks.  Has decreased toast... higher fiber bread. Now using [place].  She has stopped the 2-3 ice creams a day.. now has stopped. Wt Readings from Last 3 Encounters:  09/24/20 189 lb (85.7 kg)  09/11/20 191 lb 12 oz (87 kg)  09/03/20 190 lb (86.2 kg)         This visit occurred during the SARS-CoV-2 public health emergency.  Safety protocols were in place, including screening questions prior to the visit, additional usage of staff PPE, and extensive cleaning of exam room while observing appropriate contact time as indicated for disinfecting solutions.   COVID 19 screen:  No recent travel or known exposure to COVID19 The patient denies respiratory symptoms of COVID 19 at this time. The importance of social distancing was discussed today.     Review of Systems  Constitutional: Negative for chills and fever.  HENT: Negative for congestion and ear pain.   Eyes: Negative for pain and redness.  Respiratory: Negative for cough and shortness of breath.   Cardiovascular: Negative for chest pain, palpitations and leg swelling.  Gastrointestinal: Negative for abdominal pain, blood in stool, constipation, diarrhea, nausea and vomiting.  Genitourinary: Negative for dysuria.  Musculoskeletal: Negative for falls and myalgias.  Skin: Negative for rash.  Neurological: Negative for dizziness.  Psychiatric/Behavioral: Negative  for depression. The patient is not nervous/anxious.       Past Medical History:  Diagnosis Date  . Arthritis   . Asthma   . COPD (chronic obstructive pulmonary disease) ([place])   . Diabetes ([place])   . Thyroid disease     reports that she quit smoking about 32 years ago. Her smoking use included cigarettes. She has a 45.00 pack-year smoking history. She has never used smokeless tobacco. She reports that she does not drink alcohol and does not use drugs.   Current Outpatient Medications:  .  ACCU-CHEK GUIDE test strip, See admin instructions., Disp: , Rfl:  .  Accu-Chek Softclix Lancets lancets, as directed., Disp: , Rfl:  .  albuterol (PROVENTIL) (2.5 MG/3ML) 0.083% nebulizer solution, Take 3 mLs (2.5 mg total) by nebulization every 4 (four) hours as needed for wheezing or shortness of breath., Disp: 150 mL, Rfl: 5 .  albuterol (VENTOLIN HFA) 108 (90 Base) MCG/ACT inhaler, INHALE 1 TO 2 PUFFS BY MOUTH EVERY 6 HOURS FOR PAIN OR WHEEZING OR SHORTNESS OF BREATH, Disp: 18 g, Rfl: 1 .  benzonatate (TESSALON) 200 MG capsule, Take 1 capsule (200 mg total) by mouth 3 (three) times daily as needed for cough., Disp: 30 capsule, Rfl: 1 .  Blood Glucose Monitoring Suppl (ACCU-CHEK GUIDE ME) w/Device KIT, See admin instructions., Disp: , Rfl:  .  Cholecalciferol (VITAMIN D PO), Take 1 tablet by mouth daily., Disp: , Rfl:  .  clobetasol cream (TEMOVATE) 9.81 %, Apply 1 application topically daily  as needed. , Disp: , Rfl:  .  fluticasone furoate-vilanterol (BREO ELLIPTA) 100-25 MCG/INH AEPB, Inhale 1 puff into the lungs daily., Disp: 160 each, Rfl: 3 .  gabapentin (NEURONTIN) 100 MG capsule, TAKE 1 CAPSULE(100 MG) BY MOUTH THREE TIMES DAILY, Disp: 90 capsule, Rfl: 3 .  glipiZIDE (GLUCOTROL XL) 10 MG 24 hr tablet, TAKE 1 TABLET(10 MG) BY MOUTH DAILY WITH BREAKFAST, Disp: 90 tablet, Rfl: 0 .  ipratropium-albuterol (DUONEB) 0.5-2.5 (3) MG/3ML SOLN, USE 3 ML VIA NEBULIZER EVERY 6 HOURS AS NEEDED, Disp: 360 mL,  Rfl: 1 .  levothyroxine (SYNTHROID) 75 MCG tablet, TAKE 1 TABLET BY MOUTH EVERY DAY BEFORE BREAKFAST, Disp: 90 tablet, Rfl: 0 .  metFORMIN (GLUCOPHAGE) 500 MG tablet, Take 500 mg by mouth 2 (two) times daily with a meal., Disp: , Rfl:  .  Multiple Vitamin (MULTIVITAMIN) capsule, Take 1 capsule by mouth daily., Disp: , Rfl:  .  NYSTATIN powder, APPLY EXTERNALLY TO THE AFFECTED AREA TWICE DAILY, Disp: 30 g, Rfl: 0 .  predniSONE (DELTASONE) 5 MG tablet, TAKE 1 TABLET(5 MG) BY MOUTH DAILY WITH BREAKFAST, Disp: 90 tablet, Rfl: 0   Observations/Objective: Blood pressure (!) 144/80, pulse 93, height '5\' 4"'  (1.626 m), weight 189 lb (85.7 kg), SpO2 98 %.  Physical Exam Constitutional:      General: She is not in acute distress.    Appearance: Normal appearance. She is well-developed. She is obese. She is not ill-appearing or toxic-appearing.  HENT:     Head: Normocephalic.     Right Ear: Hearing, tympanic membrane, ear canal and external ear normal. Tympanic membrane is not erythematous, retracted or bulging.     Left Ear: Hearing, tympanic membrane, ear canal and external ear normal. Tympanic membrane is not erythematous, retracted or bulging.     Nose: No mucosal edema or rhinorrhea.     Right Sinus: No maxillary sinus tenderness or frontal sinus tenderness.     Left Sinus: No maxillary sinus tenderness or frontal sinus tenderness.     Mouth/Throat:     Pharynx: Uvula midline.  Eyes:     General: Lids are normal. Lids are everted, no foreign bodies appreciated.     Conjunctiva/sclera: Conjunctivae normal.     Pupils: Pupils are equal, round, and reactive to light.  Neck:     Thyroid: No thyroid mass or thyromegaly.     Vascular: No carotid bruit.     Trachea: Trachea normal.  Cardiovascular:     Rate and Rhythm: Normal rate and regular rhythm.     Pulses: Normal pulses.     Heart sounds: Normal heart sounds, S1 normal and S2 normal. No murmur heard.  No friction rub. No gallop.    Pulmonary:     Effort: Pulmonary effort is normal. No tachypnea or respiratory distress.     Breath sounds: Normal breath sounds. No decreased breath sounds, wheezing, rhonchi or rales.  Abdominal:     General: Bowel sounds are normal.     Palpations: Abdomen is soft.     Tenderness: There is no abdominal tenderness.  Musculoskeletal:     Cervical back: Normal range of motion and neck supple.  Skin:    General: Skin is warm and dry.     Findings: No rash.  Neurological:     Mental Status: She is alert.  Psychiatric:        Mood and Affect: Mood is not anxious or depressed.        Speech: Speech normal.  Behavior: Behavior normal. Behavior is cooperative.        Thought Content: Thought content normal.        Judgment: Judgment normal.      Assessment and Plan   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) Improved control with taking meds correctly and getting back on trak with diet changes.  Continue current regimen as in 86 year old we do not want to be aggressive and have hypoglycemia. Follow up in 3 months.. can tolerate A1C up to 8.     Eliezer Lofts, MD

## 2020-09-27 ENCOUNTER — Other Ambulatory Visit: Payer: Self-pay | Admitting: Internal Medicine

## 2020-10-14 ENCOUNTER — Encounter: Payer: Self-pay | Admitting: Pulmonary Disease

## 2020-10-14 ENCOUNTER — Ambulatory Visit (INDEPENDENT_AMBULATORY_CARE_PROVIDER_SITE_OTHER): Payer: Medicare Other | Admitting: Pulmonary Disease

## 2020-10-14 ENCOUNTER — Other Ambulatory Visit: Payer: Self-pay

## 2020-10-14 VITALS — BP 120/84 | HR 91 | Temp 97.1°F | Ht 64.0 in | Wt 177.2 lb

## 2020-10-14 DIAGNOSIS — E669 Obesity, unspecified: Secondary | ICD-10-CM

## 2020-10-14 DIAGNOSIS — R0602 Shortness of breath: Secondary | ICD-10-CM | POA: Diagnosis not present

## 2020-10-14 DIAGNOSIS — R053 Chronic cough: Secondary | ICD-10-CM | POA: Diagnosis not present

## 2020-10-14 DIAGNOSIS — J3089 Other allergic rhinitis: Secondary | ICD-10-CM | POA: Diagnosis not present

## 2020-10-14 DIAGNOSIS — J449 Chronic obstructive pulmonary disease, unspecified: Secondary | ICD-10-CM | POA: Diagnosis not present

## 2020-10-14 MED ORDER — MONTELUKAST SODIUM 10 MG PO TABS: 10.0000 mg | ORAL_TABLET | Freq: Every day | ORAL | 3 refills | Status: DC

## 2020-10-14 NOTE — Progress Notes (Signed)
Subjective:    Patient ID: Megan Bradshaw, female    DOB: 17-Apr-1930, 86 y.o.   MRN: [id number]  Pt profile: 86 y.o.F, former smoker, moved from [place]. Initially seen by Dr Melvyn Novas. Diagnosed with COPD in 2011. Graded as Gold I by Dr.  Melvyn Novas. Chronic prednisone therapy for non-pulmonary diagnosis.  Last PFTs 02/16/2016.  Followed by Dr. Alva Garnet since October 2016. Establish with me in November 2020 after Dr. Alva Garnet departure from the practice.  PROBLEMS: Gold II COPD with chronic asthmatic bronchitis Chronic refractory cough Class III dyspnea out of proportion to PFT parameters  DATA: 10/22/14 PFTs:mild obstruction, FEV1 1.36 L (81%), FEV1/FVC 53%, TLC normal, DLCO 51% pred 02/16/16 PFTs:FVC: 2.23 L (90 %pred), FEV1: 1.30 L (71 %pred), FEV1/FVC: 58% , TLC: invalid, DLCO 78% pred, consistent with mild/moderate obstructive defect. 10/14/2019 2D echo: LVEF 50 to 82%, normal diastolics, normal pulmonary artery systolic pressure  INTERVAL: Last seen in this office10/21/2021 by Rexene Edison, NP. At that time switch to [facility] and as needed DuoNeb given. Started also on antireflux regimen. Patient reports itching with Pepcid, she discontinued this.  HPI Patient presents today for follow-up.  Has no specific new complaint.  Continues to have issues with nasal congestion postnasal drip and cough.  Reflux symptoms are better.  She is compliant with PPI however had to discontinue Pepcid due to itching from the medication. She voices no new complaint today continues to have issues with some dry cough. She has quite a nasal speech today but states that she does not feel nasally congested though she does frequent throat clearing. She has not had any fevers, chills or sweats. As noted the cough is nonproductive and she has had no hemoptysis. No chest pain. Sleeping well. Otherwise she feels well and looks well.   Review of Systems A 10 point review of systems was performed and it is as noted  above otherwise negative.  Patient Active Problem List   Diagnosis Date Noted  . GERD (gastroesophageal reflux disease) 09/03/2020  . Elevated blood pressure reading in office without diagnosis of hypertension 06/07/2018  . Arthritis 12/07/2017  . OAB (overactive bladder) 09/01/2016  . Seasonal allergies 09/01/2016  . 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 Hypothyroid 09/22/2014  . COPD GOLD I 08/12/2014       Objective:   Physical Exam BP 120/84 (BP Location: Right Arm, Patient Position: Sitting, Cuff Size: Normal)   Pulse 91   Temp (!) 97.1 F (36.2 C)   Ht 5\' 4"  (1.626 m)   Wt 177 lb 3.2 oz (80.4 kg)   SpO2 95%   BMI 30.42 kg/m  GENERAL: Overweight elderly female, quite spry. Walks with assistance of walker. No conversational dyspnea. No distress. HEAD: Normocephalic, atraumatic.  EYES: Pupils equal, round, reactive to light.  No scleral icterus.  MOUTH: Nose/mouth/throat not examined due to masking requirements for COVID 19. NECK: Supple. No thyromegaly. Trachea midline. No JVD.  No adenopathy. PULMONARY: Good air entry bilaterally. Coarse with no other adventitious sounds. CARDIOVASCULAR: S1 and S2. Regular rate and rhythm. Soft grade 1/6 systolic ejection murmur left sternal border. No rubs or gallops noted. ABDOMEN: Obese,nondistended. MUSCULOSKELETAL: No joint deformity, no clubbing, no edema.  NEUROLOGIC: No overt focal deficits noted. Speech is fluent. SKIN: Intact,warm,dry. On limited exam no rashes. PSYCH: Mood and behavior normal     Assessment & Plan:     ICD-10-CM   1. Stage 2 moderate COPD by GOLD classification ([place])  J44.9    Continue Breo Ellipta and as needed DuoNeb  2. Chronic cough  R05.3    Appears to have an element of postnasal drip Suspect upper airway cough syndrome Trial of montelukast  3. Perennial allergic rhinitis  J30.89    Trial of montelukast as above  4. SOB (shortness of breath)   R06.02    Chronic issue, appears compensated  5. Obesity (BMI 30.0-34.9)  E66.9    Weight loss recommended   Meds ordered this encounter  Medications  . montelukast (SINGULAIR) 10 MG tablet    Sig: Take 1 tablet (10 mg total) by mouth at bedtime.    Dispense:  30 tablet    Refill:  3   Discussion:  Patient appears to be fairly well compensated. Continue medications as they currently are. We will see her in follow-up in 4 to 6 weeks time she is to contact us prior to that time should any new difficulties arise.  Renold Don, MD [facility]   *This note was dictated using voice recognition software/Dragon.  Despite best efforts to proofread, errors can occur which can change the meaning.  Any change was purely unintentional.

## 2020-10-14 NOTE — Patient Instructions (Signed)
Continue using your Breo and your nebulizer treatments  We have added montelukast to see if this helps with your asthma component of COPD  We will see you in follow-up in 4 to 6 weeks time call sooner should any new problems arise

## 2020-10-15 ENCOUNTER — Encounter: Payer: Self-pay | Admitting: Pulmonary Disease

## 2020-11-03 ENCOUNTER — Ambulatory Visit (INDEPENDENT_AMBULATORY_CARE_PROVIDER_SITE_OTHER): Payer: Medicare Other | Admitting: Dermatology

## 2020-11-03 ENCOUNTER — Other Ambulatory Visit: Payer: Self-pay

## 2020-11-03 DIAGNOSIS — L821 Other seborrheic keratosis: Secondary | ICD-10-CM

## 2020-11-03 DIAGNOSIS — D1800 Hemangioma unspecified site: Secondary | ICD-10-CM | POA: Diagnosis not present

## 2020-11-03 DIAGNOSIS — L820 Inflamed seborrheic keratosis: Secondary | ICD-10-CM | POA: Diagnosis not present

## 2020-11-03 NOTE — Progress Notes (Signed)
   Follow-Up Visit   Subjective  Megan Bradshaw is a 86 y.o. female who presents for the following: Brown spot (Left neck, present for years, not sure if area has changed.) and itchy spots (back).   The following portions of the chart were reviewed this encounter and updated as appropriate:       Review of Systems:  No other skin or systemic complaints except as noted in HPI or Assessment and Plan.  Objective  Well appearing patient in no apparent distress; mood and affect are within normal limits.  A focused examination was performed including face, neck, back. Relevant physical exam findings are noted in the Assessment and Plan.  Objective  Back x 3 (3): Erythematous keratotic or waxy stuck-on papule   Assessment & Plan   Seborrheic Keratoses - Stuck-on, waxy, tan-brown papules and plaques, including left neck and back  - Discussed benign etiology and prognosis. - Observe - Call for any changes  Hemangiomas - Red papules - Discussed benign nature - Observe - Call for any changes  Inflamed seborrheic keratosis (3) Back x 3  Destruction of lesion - Back x 3  Destruction method: cryotherapy   Informed consent: discussed and consent obtained   Lesion destroyed using liquid nitrogen: Yes   Region frozen until ice ball extended beyond lesion: Yes   Outcome: patient tolerated procedure well with no complications   Post-procedure details: wound care instructions given    Return if symptoms worsen or fail to improve.   IJamesetta Orleans, CMA, am acting as scribe for Brendolyn Patty, MD .  Documentation: I have reviewed the above documentation for accuracy and completeness, and I agree with the above.  Brendolyn Patty MD

## 2020-11-03 NOTE — Patient Instructions (Addendum)
Cryotherapy Aftercare  . Wash gently with soap and water everyday.   . Apply Vaseline and Band-Aid daily until healed.   Seborrheic Keratosis  What causes seborrheic keratoses? Seborrheic keratoses are harmless, common skin growths that first appear during adult life.  As time goes by, more growths appear.  Some people may develop a large number of them.  Seborrheic keratoses appear on both covered and uncovered body parts.  They are not caused by sunlight.  The tendency to develop seborrheic keratoses can be inherited.  They vary in color from skin-colored to gray, brown, or even black.  They can be either smooth or have a rough, warty surface.   Seborrheic keratoses are superficial and look as if they were stuck on the skin.  Under the microscope this type of keratosis looks like layers upon layers of skin.  That is why at times the top layer may seem to fall off, but the rest of the growth remains and re-grows.    Treatment Seborrheic keratoses do not need to be treated, but can easily be removed in the office.  Seborrheic keratoses often cause symptoms when they rub on clothing or jewelry.  Lesions can be in the way of shaving.  If they become inflamed, they can cause itching, soreness, or burning.  Removal of a seborrheic keratosis can be accomplished by freezing, burning, or surgery. If any spot bleeds, scabs, or grows rapidly, please return to have it checked, as these can be an indication of a skin cancer. 

## 2020-12-01 ENCOUNTER — Other Ambulatory Visit: Payer: Self-pay | Admitting: Internal Medicine

## 2020-12-02 ENCOUNTER — Encounter: Payer: Self-pay | Admitting: Pulmonary Disease

## 2020-12-02 ENCOUNTER — Other Ambulatory Visit: Payer: Self-pay

## 2020-12-02 ENCOUNTER — Ambulatory Visit (INDEPENDENT_AMBULATORY_CARE_PROVIDER_SITE_OTHER): Payer: Medicare Other | Admitting: Pulmonary Disease

## 2020-12-02 VITALS — BP 132/76 | HR 74 | Temp 97.0°F | Ht 64.0 in | Wt 189.6 lb

## 2020-12-02 DIAGNOSIS — E669 Obesity, unspecified: Secondary | ICD-10-CM

## 2020-12-02 DIAGNOSIS — R0602 Shortness of breath: Secondary | ICD-10-CM | POA: Diagnosis not present

## 2020-12-02 DIAGNOSIS — R053 Chronic cough: Secondary | ICD-10-CM | POA: Diagnosis not present

## 2020-12-02 DIAGNOSIS — J3089 Other allergic rhinitis: Secondary | ICD-10-CM | POA: Diagnosis not present

## 2020-12-02 DIAGNOSIS — J449 Chronic obstructive pulmonary disease, unspecified: Secondary | ICD-10-CM

## 2020-12-02 NOTE — Progress Notes (Signed)
Subjective:    Patient ID: Megan Bradshaw, female    DOB: [birth year], 88 y.o.   MRN: [id number]  Pt profile: 87y.o.F, former smoker,moved from [id number]. Initially seen by Dr Melvyn Novas. Diagnosed with COPD in 2011. Graded as Gold I by Dr.Wert. Chronic prednisone therapyfor non-pulmonary diagnosis. Last PFTs4/02/2016. Followed by Dr. Coletta Memos October 2016. Establish with me in November 2020 after Dr. Alva Garnet departure from the practice.  PROBLEMS: Gold IICOPD with chronic asthmatic bronchitis Chronic refractory cough Class III dyspnea out of proportion to PFT parameters  DATA: 10/22/14 PFTs:mild obstruction, FEV1 1.36 L (81%), FEV1/FVC 53%, TLC normal, DLCO 51% pred 02/16/16 PFTs:FVC: 2.23 L (90 %pred), FEV1: 1.30 L (71 %pred), FEV1/FVC: 58% , TLC: invalid, DLCO 78% pred, consistent with mild/moderate obstructive defect. 10/14/2019 2D echo: LVEF 50 to 70%, normal diastolics, normal pulmonary artery systolic pressure  INTERVAL: Last seen in this office12/01/2021by me. At that time continued [facility] and as needed DuoNeb.  Restarted montelukast.  HPI As ever, Megan Bradshaw, presents with complaints of dyspnea but not over her usual pattern.  She really has no new specific complaint.  She continues to have issues with nasal congestion and postnasal drip though admittedly has not started her montelukast.  She is concerned that she may have some mood issues with the Singulair.  We discussed what to watch for.  She is using DuoNeb as needed and that seems to help her clear secretions.  She has not had any fevers, chills or sweats.  She voices no other new complaint.  She otherwise feels well and looks well.   Review of Systems A 10 point review of systems was performed and it is as noted above otherwise negative.  Patient Active Problem List   Diagnosis Date Noted  . Arthritis 12/07/2017  . OAB (overactive bladder) 09/01/2016  . Seasonal allergies 09/01/2016  .  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 Hypothyroid 09/22/2014  . Stage 2 moderate COPD by GOLD classification ([place]) 08/12/2014   No Known Allergies Current Meds  Medication Sig  . Accu-Chek Softclix Lancets lancets as directed.  Marland Kitchen albuterol (PROVENTIL) (2.5 MG/3ML) 0.083% nebulizer solution Take 3 mLs (2.5 mg total) by nebulization every 4 (four) hours as needed for wheezing or shortness of breath.  Marland Kitchen albuterol (VENTOLIN HFA) 108 (90 Base) MCG/ACT inhaler INHALE 1 TO 2 PUFFS BY MOUTH EVERY 6 HOURS FOR PAIN OR WHEEZING OR SHORTNESS OF BREATH  . benzonatate (TESSALON) 200 MG capsule Take 1 capsule (200 mg total) by mouth 3 (three) times daily as needed for cough.  . Blood Glucose Monitoring Suppl (ACCU-CHEK GUIDE ME) w/Device KIT See admin instructions.  . Cholecalciferol (VITAMIN D PO) Take 1 tablet by mouth daily.  Marland Kitchen dextromethorphan (DELSYM) 30 MG/5ML liquid Take 30 mg by mouth as needed for cough.  . gabapentin (NEURONTIN) 100 MG capsule TAKE 1 CAPSULE(100 MG) BY MOUTH THREE TIMES DAILY  . ipratropium-albuterol (DUONEB) 0.5-2.5 (3) MG/3ML SOLN USE 3 ML VIA NEBULIZER EVERY 6 HOURS AS NEEDED  . metFORMIN (GLUCOPHAGE) 500 MG tablet Take 500 mg by mouth 2 (two) times daily with a meal.  . montelukast (SINGULAIR) 10 MG tablet Take 1 tablet (10 mg total) by mouth at bedtime.  . Multiple Vitamin (MULTIVITAMIN) capsule Take 1 capsule by mouth daily.  Marland Kitchen omeprazole (PRILOSEC) 20 MG capsule Take 20 mg by mouth daily.  Marland Kitchen OVER THE COUNTER MEDICATION Take by mouth. [place]x liquid twice daily as needed  . predniSONE (DELTASONE) 5 MG  tablet TAKE 1 TABLET(5 MG) BY MOUTH DAILY WITH BREAKFAST  . [DISCONTINUED] ACCU-CHEK GUIDE test strip See admin instructions.  . [DISCONTINUED] clobetasol cream (TEMOVATE) 0.86 % Apply 1 application topically daily as needed.   . [DISCONTINUED] glipiZIDE (GLUCOTROL XL) 10 MG 24 hr tablet TAKE 1 TABLET(10 MG) BY MOUTH DAILY  WITH BREAKFAST  . [DISCONTINUED] levothyroxine (SYNTHROID) 75 MCG tablet TAKE 1 TABLET BY MOUTH EVERY DAY BEFORE BREAKFAST  . [DISCONTINUED] NYSTATIN powder APPLY EXTERNALLY TO THE AFFECTED AREA TWICE DAILY   Immunization History  Administered Date(s) Administered  . Fluad Quad(high Dose 65+) 09/03/2020  . Influenza,inj,Quad PF,6+ Mos 08/20/2015, 07/22/2016, 08/14/2017, 08/23/2018, 08/29/2019  . Influenza-Unspecified 08/14/2014  . PFIZER(Purple Top)SARS-COV-2 Vaccination 01/07/2020, 01/28/2020, 10/11/2020  . Pneumococcal Conjugate-13 08/20/2015  . Pneumococcal Polysaccharide-23 08/29/2019       Objective:   Physical Exam BP 132/76 (BP Location: Left Arm, Cuff Size: Normal)   Pulse 74   Temp (!) 97 F (36.1 C) (Temporal)   Ht '5\' 4"'  (1.626 m)   Wt 189 lb 9.6 oz (86 kg)   SpO2 97%   BMI 32.54 kg/m  GENERAL: Overweight elderly female, quite spry.  Presents in transport chair. No conversational dyspnea. No distress. HEAD: Normocephalic, atraumatic.  EYES: Pupils equal, round, reactive to light.  No scleral icterus.  MOUTH: Nose/mouth/throat not examined due to masking requirements for COVID 19. NECK: Supple. No thyromegaly. Trachea midline. No JVD.  No adenopathy. PULMONARY: Good air entry bilaterally. Coarse with no other adventitious sounds. CARDIOVASCULAR: S1 and S2. Regular rate and rhythm. Soft grade 1/6 systolic ejection murmur left sternal border. No rubs or gallops noted. ABDOMEN: Obese,nondistended. MUSCULOSKELETAL: No joint deformity, no clubbing, no edema.  NEUROLOGIC: No overt focal deficits noted. Speech is fluent. SKIN: Intact,warm,dry. On limited exam no rashes. PSYCH: Mood and behavior normal     Assessment & Plan:     ICD-10-CM   1. Stage 2 moderate COPD by GOLD classification ([place])  J44.9    Continue Breo Ellipta Continue as needed DuoNeb  2. Chronic cough  R05.3    Better on the above regimen  3. Perennial allergic rhinitis  J30.89    Start montelukast  as previously directed Watch for mood swings or irritability  4. SOB (shortness of breath)  R06.02    No change  5. Obesity (BMI 30.0-34.9)  E66.9    Weight loss recommended   Follow-up will be in 6 months time she is to contact us prior to that time should any new difficulties arise.  Renold Don, MD [place] [facility]   *This note was dictated using voice recognition software/Dragon.  Despite best efforts to proofread, errors can occur which can change the meaning.  Any change was purely unintentional.

## 2020-12-02 NOTE — Patient Instructions (Signed)
Take your Singulair in the morning not at nighttime.  Stop Singulair if you notice any changes like irritability or mood change.  We will see you in follow-up in 6 months time, call sooner should you develop any new issues.

## 2020-12-07 ENCOUNTER — Other Ambulatory Visit: Payer: Medicare Other

## 2020-12-10 ENCOUNTER — Ambulatory Visit: Payer: Medicare Other | Admitting: Internal Medicine

## 2020-12-15 ENCOUNTER — Ambulatory Visit (INDEPENDENT_AMBULATORY_CARE_PROVIDER_SITE_OTHER): Payer: Medicare Other | Admitting: Internal Medicine

## 2020-12-15 ENCOUNTER — Encounter: Payer: Self-pay | Admitting: Internal Medicine

## 2020-12-15 ENCOUNTER — Other Ambulatory Visit: Payer: Self-pay | Admitting: Internal Medicine

## 2020-12-15 ENCOUNTER — Other Ambulatory Visit: Payer: Self-pay

## 2020-12-15 VITALS — BP 132/78 | HR 77 | Temp 96.8°F | Wt 192.0 lb

## 2020-12-15 DIAGNOSIS — M1990 Unspecified osteoarthritis, unspecified site: Secondary | ICD-10-CM | POA: Diagnosis not present

## 2020-12-15 DIAGNOSIS — N3281 Overactive bladder: Secondary | ICD-10-CM | POA: Diagnosis not present

## 2020-12-15 DIAGNOSIS — T380X5D Adverse effect of glucocorticoids and synthetic analogues, subsequent encounter: Secondary | ICD-10-CM | POA: Diagnosis not present

## 2020-12-15 DIAGNOSIS — J449 Chronic obstructive pulmonary disease, unspecified: Secondary | ICD-10-CM | POA: Diagnosis not present

## 2020-12-15 DIAGNOSIS — E039 Hypothyroidism, unspecified: Secondary | ICD-10-CM

## 2020-12-15 DIAGNOSIS — E099 Drug or chemical induced diabetes mellitus without complications: Secondary | ICD-10-CM

## 2020-12-15 LAB — LIPID PANEL
Cholesterol: 168 mg/dL (ref 0–200)
HDL: 54.7 mg/dL (ref >39.00)
LDL Cholesterol: 80 mg/dL (ref 0–99)
NonHDL: 113.69
Total CHOL/HDL Ratio: 3
Triglycerides: 170 mg/dL — ABNORMAL HIGH (ref 0.0–149.0)
VLDL: 34 mg/dL (ref 0.0–40.0)

## 2020-12-15 LAB — MICROALBUMIN / CREATININE URINE RATIO
Creatinine,U: 77.3 mg/dL
Microalb Creat Ratio: 0.9 mg/g (ref 0.0–30.0)
Microalb, Ur: <0.7 mg/dL (ref 0.0–1.9)

## 2020-12-15 LAB — T4, FREE: Free T4: 0.85 ng/dL (ref 0.60–1.60)

## 2020-12-15 LAB — HEMOGLOBIN A1C: Hgb A1c MFr Bld: 7.6 % — ABNORMAL HIGH (ref 4.6–6.5)

## 2020-12-15 LAB — TSH: TSH: 3.23 u[IU]/mL (ref 0.35–4.50)

## 2020-12-15 NOTE — Assessment & Plan Note (Signed)
Managed on Breo, Prednisone and Duonebs She will continue to follow with pulmonology

## 2020-12-15 NOTE — Progress Notes (Signed)
Subjective:    Patient ID: Megan Bradshaw, female    DOB: 08-03-1930, 87 y.o.   MRN: [id number]  HPI  Patient presents the clinic today for follow-up of chronic conditions.  OA: Mainly in her back and knees.  She takes Prednisone daily as prescribed.  She is not currently taking any OTC medications for this.  Asthma/COPD: Debilitating, with chronic cough and shortness of breath.  Managed with Breo and Prednisone.  She uses her Duoneb nebulizersas needed.  PFTs from 02/2016 reviewed.  She follows with Dr. Patsey Berthold.  Steroid-Induced Diabetes with Neuropathy: Her last A1c was 8.9 %, 08/2020.  She does not monitor her sugars.  She is taking Metformin, Glipizide and Gabapentin as prescribed.  She checks her feet routinely.  Her last eye exam was more than 1 year ago.  Flu 08/2020.  Pneumovax 08/2019.  Prevnar 08/2015.  COVID, Pfizer x 3.  Hypothyroidism: She denies any issues on her current dose of Levothyroxine.  She does not follow with endocrinology.  OAB: She is managing this without medications at this time.  She does not follow with urology.  Review of Systems      Past Medical History:  Diagnosis Date  . Arthritis   . Asthma   . COPD (chronic obstructive pulmonary disease) ([place])   . Diabetes ([place])   . Thyroid disease     Current Outpatient Medications  Medication Sig Dispense Refill  . ACCU-CHEK GUIDE test strip See admin instructions.    . Accu-Chek Softclix Lancets lancets as directed.    Marland Kitchen albuterol (PROVENTIL) (2.5 MG/3ML) 0.083% nebulizer solution Take 3 mLs (2.5 mg total) by nebulization every 4 (four) hours as needed for wheezing or shortness of breath. 150 mL 5  . albuterol (VENTOLIN HFA) 108 (90 Base) MCG/ACT inhaler INHALE 1 TO 2 PUFFS BY MOUTH EVERY 6 HOURS FOR PAIN OR WHEEZING OR SHORTNESS OF BREATH 18 g 1  . benzonatate (TESSALON) 200 MG capsule Take 1 capsule (200 mg total) by mouth 3 (three) times daily as needed for cough. 30 capsule 1  . Blood Glucose  Monitoring Suppl (ACCU-CHEK GUIDE ME) w/Device KIT See admin instructions.    . Cholecalciferol (VITAMIN D PO) Take 1 tablet by mouth daily.    . clobetasol cream (TEMOVATE) 4.09 % Apply 1 application topically daily as needed.     Marland Kitchen dextromethorphan (DELSYM) 30 MG/5ML liquid Take 30 mg by mouth as needed for cough.    . gabapentin (NEURONTIN) 100 MG capsule TAKE 1 CAPSULE(100 MG) BY MOUTH THREE TIMES DAILY 90 capsule 3  . glipiZIDE (GLUCOTROL XL) 10 MG 24 hr tablet TAKE 1 TABLET(10 MG) BY MOUTH DAILY WITH BREAKFAST 90 tablet 0  . ipratropium-albuterol (DUONEB) 0.5-2.5 (3) MG/3ML SOLN USE 3 ML VIA NEBULIZER EVERY 6 HOURS AS NEEDED 360 mL 1  . levothyroxine (SYNTHROID) 75 MCG tablet TAKE 1 TABLET BY MOUTH EVERY DAY BEFORE BREAKFAST 90 tablet 0  . loratadine (CLARITIN) 10 MG tablet Take 10 mg by mouth daily. (Patient not taking: Reported on 12/02/2020)    . metFORMIN (GLUCOPHAGE) 500 MG tablet Take 500 mg by mouth 2 (two) times daily with a meal.    . montelukast (SINGULAIR) 10 MG tablet Take 1 tablet (10 mg total) by mouth at bedtime. 30 tablet 3  . Multiple Vitamin (MULTIVITAMIN) capsule Take 1 capsule by mouth daily.    . NYSTATIN powder APPLY EXTERNALLY TO THE AFFECTED AREA TWICE DAILY 30 g 0  . omeprazole (PRILOSEC) 20 MG capsule Take  20 mg by mouth daily.    Marland Kitchen OVER THE COUNTER MEDICATION Take by mouth. Musinex liquid twice daily as needed    . predniSONE (DELTASONE) 5 MG tablet TAKE 1 TABLET(5 MG) BY MOUTH DAILY WITH BREAKFAST 90 tablet 0   No current facility-administered medications for this visit.    No Known Allergies  Family History  Problem Relation Age of Onset  . Arthritis Father   . Diabetes Sister   . Diabetes Daughter   . Cancer Neg Hx   . Heart disease Neg Hx   . Stroke Neg Hx     Social History   Socioeconomic History  . Marital status: Widowed    Spouse name: Not on file  . Number of children: Not on file  . Years of education: Not on file  . Highest education  level: Not on file  Occupational History  . Occupation: Retired  Tobacco Use  . Smoking status: Former Smoker    Packs/day: 1.50    Years: 30.00    Pack years: 45.00    Types: Cigarettes    Quit date: 11/15/1987    Years since quitting: 33.1  . Smokeless tobacco: Never Used  Vaping Use  . Vaping Use: Never used  Substance and Sexual Activity  . Alcohol use: No    Alcohol/week: 0.0 standard drinks  . Drug use: No  . Sexual activity: Not on file  Other Topics Concern  . Not on file  Social History Narrative  . Not on file   Social Determinants of Health   Financial Resource Strain: Not on file  Food Insecurity: Not on file  Transportation Needs: Not on file  Physical Activity: Not on file  Stress: Not on file  Social Connections: Not on file  Intimate Partner Violence: Not on file     Constitutional: Patient reports chronic fatigue.  Denies fever, malaise, headache or abrupt weight changes.  HEENT: Denies eye pain, eye redness, ear pain, ringing in the ears, wax buildup, runny nose, nasal congestion, bloody nose, or sore throat. Respiratory: Patient reports chronic cough and shortness of breath.  Denies difficulty breathing, or sputum production.   Cardiovascular: Denies chest pain, chest tightness, palpitations or swelling in the hands or feet.  Gastrointestinal: Denies abdominal pain, bloating, constipation, diarrhea or blood in the stool.  GU: Patient reports urinary frequency and nocturia.  Denies urgency, pain with urination, burning sensation, blood in urine, odor or discharge. Musculoskeletal: Patient reports joint pain.  Denies decrease in range of motion, difficulty with gait, muscle pain or joint swelling.  Skin: Denies redness, rashes, lesions or ulcercations.  Neurological: Denies dizziness, difficulty with memory, difficulty with speech or problems with balance and coordination.  Psych: Denies anxiety, depression, SI/HI.  No other specific complaints in a  complete review of systems (except as listed in HPI above).  Objective:   Physical Exam BP 132/78   Pulse 77   Temp (!) 96.8 F (36 C) (Temporal)   Wt 192 lb (87.1 kg)   SpO2 97%   BMI 32.96 kg/m   Wt Readings from Last 3 Encounters:  12/02/20 189 lb 9.6 oz (86 kg)  10/14/20 177 lb 3.2 oz (80.4 kg)  09/24/20 189 lb (85.7 kg)    General: Appears her stated age, obese, in NAD. Skin: Warm, dry and intact. No ulcerations noted. HEENT: Head: normal shape and size;  Neck:  Neck supple, trachea midline. No masses, lumps present.  Cardiovascular: Normal rate and rhythm. S1,S2 noted.  No  murmur, rubs or gallops noted. No JVD or BLE edema. No carotid bruits noted. Pulmonary/Chest: Normal effort and positive vesicular breath sounds. No respiratory distress. No wheezes, rales or ronchi noted.  Musculoskeletal: No difficulty with gait.  Neurological: Alert and oriented. Pain with palpation of BLE.   BMET    Component Value Date/Time   NA 135 09/03/2020 1024   K 4.9 09/03/2020 1024   CL 99 09/03/2020 1024   CO2 25 09/03/2020 1024   GLUCOSE 253 (H) 09/03/2020 1024   BUN 19 09/03/2020 1024   CREATININE 0.87 09/03/2020 1024   CALCIUM 9.8 09/03/2020 1024   GFRNONAA >60 09/03/2020 1024   GFRAA >60 01/04/2018 0921    Lipid Panel     Component Value Date/Time   CHOL 192 08/12/2019 1618   TRIG 195.0 (H) 08/12/2019 1618   HDL 57.50 08/12/2019 1618   CHOLHDL 3 08/12/2019 1618   VLDL 39.0 08/12/2019 1618   LDLCALC 96 08/12/2019 1618    CBC    Component Value Date/Time   WBC 8.6 09/03/2020 1024   RBC 4.55 09/03/2020 1024   HGB 14.2 09/03/2020 1024   HCT 41.7 09/03/2020 1024   PLT 274 09/03/2020 1024   MCV 91.6 09/03/2020 1024   MCH 31.2 09/03/2020 1024   MCHC 34.1 09/03/2020 1024   RDW 12.9 09/03/2020 1024   LYMPHSABS 1.2 09/03/2020 1024   MONOABS 0.5 09/03/2020 1024   EOSABS 0.1 09/03/2020 1024   BASOSABS 0.1 09/03/2020 1024    Hgb A1C Lab Results  Component Value  Date   HGBA1C 8.9 (A) 09/11/2020            Assessment & Plan:    Megan Silversmith, NP This visit occurred during the SARS-CoV-2 public health emergency.  Safety protocols were in place, including screening questions prior to the visit, additional usage of staff PPE, and extensive cleaning of exam room while observing appropriate contact time as indicated for disinfecting solutions.

## 2020-12-15 NOTE — Patient Instructions (Signed)

## 2020-12-15 NOTE — Assessment & Plan Note (Signed)
Continue daily Prednisone Encouraged daily weightbearing activity

## 2020-12-15 NOTE — Assessment & Plan Note (Signed)
A1C and urine microalbumin today Encouraged her to consume a low carb diet Continue Metformin, Glipizide and Gabapentin Encouraged routine eye exams Encouraged routine foot exams Flu, pneumovax, prevnar and covid UTD

## 2020-12-15 NOTE — Assessment & Plan Note (Signed)
TSH and Free T4 today Will adjust Levothyroxine if needed based on labs 

## 2020-12-15 NOTE — Assessment & Plan Note (Signed)
Not medicated Will monitor 

## 2020-12-16 ENCOUNTER — Telehealth: Payer: Self-pay | Admitting: Internal Medicine

## 2020-12-16 MED ORDER — ACCU-CHEK GUIDE VI STRP: 1.0000 | ORAL_STRIP | Freq: Two times a day (BID) | 2 refills | Status: DC

## 2020-12-16 MED ORDER — NYSTATIN 100000 UNIT/GM EX POWD: CUTANEOUS | 1 refills | Status: DC

## 2020-12-16 MED ORDER — CLOBETASOL PROPIONATE 0.05 % EX CREA: 1.0000 "application " | TOPICAL_CREAM | Freq: Every day | CUTANEOUS | 1 refills | Status: DC | PRN

## 2020-12-16 NOTE — Telephone Encounter (Signed)
Pt requesting status of refills for glipizide, clobetasol, accu chek test strips,levothyroxine and nystatin to [facility] [address] [facility] [address]. I spoke with Manuela Schwartz at [facility] at [address] and she said they just opened and they did receive refills and to give them at least 2 hrs before trying to pick up. Pt voiced understanding.

## 2020-12-16 NOTE — Progress Notes (Signed)
°  Chronic Care Management   Outreach Note  12/16/2020 Name: Megan Bradshaw MRN: [id number] DOB: 1929/12/28  Referred by: Jearld Fenton, NP Reason for referral : Chronic Care Management   An unsuccessful telephone outreach was attempted today. The patient was referred to the pharmacist for assistance with care management and care coordination.   Follow Up Plan:   Hilario Quarry  Upstream Scheduler

## 2020-12-17 DIAGNOSIS — H903 Sensorineural hearing loss, bilateral: Secondary | ICD-10-CM | POA: Diagnosis not present

## 2020-12-21 ENCOUNTER — Telehealth: Payer: Self-pay | Admitting: Internal Medicine

## 2020-12-21 NOTE — Progress Notes (Signed)
°  Chronic Care Management   Outreach Note  12/21/2020 Name: Megan Bradshaw MRN: [id number] DOB: [birth year]  Referred by: Jearld Fenton, NP Reason for referral : Chronic Care Management   A second unsuccessful telephone outreach was attempted today. The patient was referred to pharmacist for assistance with care management and care coordination.  Follow Up Plan:   Hilario Quarry  Upstream Scheduler

## 2020-12-24 ENCOUNTER — Other Ambulatory Visit: Payer: Self-pay | Admitting: Internal Medicine

## 2020-12-25 ENCOUNTER — Telehealth: Payer: Self-pay

## 2020-12-25 NOTE — Telephone Encounter (Signed)
Pt contacted office upset that she just received her lab results from 12/15/20 in the mail and never received a call to explain them and she was also upset because they were not mailed until 12/23/20. She also voiced that she was upset that at her apt on 12/15/20 she was not examined or touched at all and that does not seem right for an annual exam.  Advised pt of results and note from PCP on results. Pt did not see note at the bottom of the lab results. Advised pt also that the apt she had on 2/1 was not her annual physical. Pt was not happy about that either because she said she has not had a physical in over 2 years. Last was 12/07/2017. She voiced concern that she needs an annual exam at her age and wanted to know why this was not done. Advised this nurse could not answer that question but did advise that she is to schedule an AWV for 3 months per PCP. Scheduled pt for May for AWV. Advised if anything else is needed to contact the office. Pt verbalized understanding.

## 2020-12-28 ENCOUNTER — Telehealth: Payer: Self-pay | Admitting: Internal Medicine

## 2020-12-28 NOTE — Progress Notes (Signed)
°  Chronic Care Management   Outreach Note  12/28/2020 Name: Megan Bradshaw MRN: [id number] DOB: 11/19/1929  Referred by: Jearld Fenton, NP Reason for referral : No chief complaint on file.   Third unsuccessful telephone outreach was attempted today. The patient was referred to the pharmacist for assistance with care management and care coordination.   Follow Up Plan:   Megan Bradshaw  Upstream Scheduler

## 2020-12-30 NOTE — Telephone Encounter (Signed)
Patient left a voicemail stating that she would like Megan Bradshaw to call her back regarding their conversation a few days ago.

## 2020-12-30 NOTE — Telephone Encounter (Signed)
Pt reports it is still bothering her that she was not contacted with her lab results and they were not mailed until 12/23/20 when she had labs drawn on 12/15/20. She also reported she did not received the letter in the mail.  She reports she still has not received the letter in the mail with the labs. Pt states, "I think something fishy is going on. You don't go to the doctor for nothing. Does she not care about her pts." Advised pt her PCP cares for all of her pts. Advised not sure what happened to the letter but the lab results could be remailed. Pt said she did not want them sent because then she would not know if they were from the person that sent them out before or from today. Advised she could check dates on postage. Advised this nurse would put her name on labs. Pt agreed, labs were printed and placed in mail with nurse's name so pt can verify who sent them. Pt wanted to what is going to be done about this. Thanked pt again for bringing this to my attention and that this will be brought to Engineer, building services when she is back in office next week. Pt asked for a f/u from this nurse to let her know what has been done. Advised pt this nurse will f/u. Pt said several times she is upset but not at this nurse. Advised this nurse will f/u with her next week. Pt appreciative and verbalized understanding.

## 2020-12-30 NOTE — Telephone Encounter (Cosign Needed)
She has to schedule the appt for a physical. She constantly comes in for acutes, physicals are not done at acute appts.

## 2021-01-01 ENCOUNTER — Telehealth: Payer: Self-pay | Admitting: Internal Medicine

## 2021-01-01 NOTE — Progress Notes (Signed)
  Chronic Care Management   Outreach Note  01/01/2021 Name: Megan Bradshaw MRN: [id number] DOB: 1929/11/20  Referred by: Jearld Fenton, NP Reason for referral : No chief complaint on file.   Third unsuccessful telephone outreach was attempted today. The patient was referred to the pharmacist for assistance with care management and care coordination.   Follow Up Plan:   Hilario Quarry  Upstream Scheduler

## 2021-01-01 NOTE — Chronic Care Management (AMB) (Signed)
  Chronic Care Management   Note  01/01/2021 Name: Megan Bradshaw MRN: [id number] DOB: February 12, 1930  Megan Bradshaw is a 87 y.o. year old female who is a primary care patient of Jearld Fenton, NP. I reached out to Bea Graff by phone today in response to a referral sent by Megan Bradshaw's PCP, Jearld Fenton, NP.   Megan Bradshaw was given information about Chronic Care Management services today including:  1. CCM service includes personalized support from designated clinical staff supervised by her physician, including individualized plan of care and coordination with other care providers 2. 24/7 contact phone numbers for assistance for urgent and routine care needs. 3. Service will only be billed when office clinical staff spend 20 minutes or more in a month to coordinate care. 4. Only one practitioner may furnish and bill the service in a calendar month. 5. The patient may stop CCM services at any time (effective at the end of the month) by phone call to the office staff.   Patient agreed to services and verbal consent obtained.   Follow up plan:   [place]

## 2021-01-05 NOTE — Telephone Encounter (Signed)
Called to follow up with patient, no answer on her voicemail, will try again tomorrow.

## 2021-01-06 NOTE — Telephone Encounter (Signed)
Spoke with patient regarding her concerns.  She says they have been adequately addressed by CMA and RN Team Lead and she thanks me for my call and concern.

## 2021-01-29 ENCOUNTER — Telehealth: Payer: Self-pay

## 2021-01-29 NOTE — Telephone Encounter (Signed)
Pt calling to speak with Michaela Corner about pt concerned that she cannot get a TOC. Advised Michaela Corner is out of office today and pt would like to speak with someone. Sending note to Donzetta Matters front office mgr.

## 2021-01-29 NOTE — Chronic Care Management (AMB) (Addendum)
Chronic Care Management Pharmacy Assistant   Name: Megan Bradshaw  MRN: [id number] DOB: June 14, 1930  .  Reason for Encounter: Initial Questions for 02/08/21 appointment   Conditions to be addressed/monitored: COPD and DMII  Recent office visits:  12/15/2020 - Webb Silversmith  NP-PCP  Recent consult visits:  No recent visits since PCP   Hospital visits:  Medication Reconciliation was completed by comparing discharge summary, patient's EMR and Pharmacy list, and upon discussion with patient. Admitted to the hospital on 08/14/2020. Due to COPD exacerbation. Discharge date was 08/14/20. Discharged from [facility].    Medications: Outpatient Encounter Medications as of 01/29/2021  Medication Sig   Accu-Chek Softclix Lancets lancets as directed.   albuterol (PROVENTIL) (2.5 MG/3ML) 0.083% nebulizer solution Take 3 mLs (2.5 mg total) by nebulization every 4 (four) hours as needed for wheezing or shortness of breath.   albuterol (VENTOLIN HFA) 108 (90 Base) MCG/ACT inhaler INHALE 1 TO 2 PUFFS BY MOUTH EVERY 6 HOURS FOR PAIN OR WHEEZING OR SHORTNESS OF BREATH   benzonatate (TESSALON) 200 MG capsule Take 1 capsule (200 mg total) by mouth 3 (three) times daily as needed for cough.   Blood Glucose Monitoring Suppl (ACCU-CHEK GUIDE ME) w/Device KIT See admin instructions.   Cholecalciferol (VITAMIN D PO) Take 1 tablet by mouth daily.   clobetasol cream (TEMOVATE) 4.65 % Apply 1 application topically daily as needed.   dextromethorphan (DELSYM) 30 MG/5ML liquid Take 30 mg by mouth as needed for cough.   gabapentin (NEURONTIN) 100 MG capsule TAKE 1 CAPSULE(100 MG) BY MOUTH THREE TIMES DAILY   glipiZIDE (GLUCOTROL XL) 10 MG 24 hr tablet TAKE 1 TABLET(10 MG) BY MOUTH DAILY WITH BREAKFAST   glucose blood (ACCU-CHEK GUIDE) test strip 1 each by Other route 2 (two) times daily.   ipratropium-albuterol (DUONEB) 0.5-2.5 (3) MG/3ML SOLN USE 3 ML VIA NEBULIZER EVERY 6 HOURS AS NEEDED   levothyroxine  (SYNTHROID) 75 MCG tablet TAKE 1 TABLET BY MOUTH EVERY DAY BEFORE BREAKFAST   loratadine (CLARITIN) 10 MG tablet Take 10 mg by mouth daily.   metFORMIN (GLUCOPHAGE) 500 MG tablet Take 500 mg by mouth 2 (two) times daily with a meal.   montelukast (SINGULAIR) 10 MG tablet Take 1 tablet (10 mg total) by mouth at bedtime.   Multiple Vitamin (MULTIVITAMIN) capsule Take 1 capsule by mouth daily.   nystatin (NYSTATIN) powder APPLY EXTERNALLY TO THE AFFECTED AREA TWICE DAILY   omeprazole (PRILOSEC) 20 MG capsule Take 20 mg by mouth daily.   OVER THE COUNTER MEDICATION Take by mouth. Musinex liquid twice daily as needed   predniSONE (DELTASONE) 5 MG tablet TAKE 1 TABLET(5 MG) BY MOUTH DAILY WITH BREAKFAST   No facility-administered encounter medications on file as of 01/29/2021.   Have you seen any other providers since your last visit?   No Any changes in your medications or health?   No  Any side effects from any medications?  No  Do you have an symptoms or problems not managed by your medications?   No  Any concerns about your health right now?   No  Has your provider asked that you check blood pressure, blood sugar, or follow special diet at home?   Yes- checks blood sugar regularly  Do you get any type of exercise on a regular basis? Walks on treadmill, stretches, water aerobics  Can you think of a goal you would like to reach for your health?   Would like to be more active  Do you have any problems getting your medications? No  Is there anything that you would like to discuss during the appointment? No   Patient states that she asked about transferring providers within [facility] but was told they are not accepting new patients. Her current provider is leaving and she wants to start seeing Dr. Diona Browner if possible.     Star Rating Drugs: Glipizide 10 mg 12/16/2020  90 DS Metformin 500 mg 01/27/2021 90 DS   Follow-Up:  Pharmacist Review  Debbora Dus, CPP  notified  CCM appointment postponed until patient establishes new PCP.  Margaretmary Dys, [place] Pharmacy Assistant [phone]

## 2021-02-01 NOTE — Telephone Encounter (Signed)
Patient walked in demanding to see the office manager to discuss why she is being considered a new patient and upset because she is being asked to transfer care to another office.  She has set up with the NP at [facility] but with her age and location combined with the fact that she has been our patient for several years, she would like to stay on with our practice.   I discussed with her care options and explained the plans for our office to help our patients connect with care at other locations.  In addition, I did inform her that we will continue to provide her with acute care until she is able to establish with her new provider.   Patient continues to state that she is feeling like we are giving her the run around and is not happy with the communication that she has gotten from our front office staff.  She continued to discuss other issues that (we have resolved) including issues with her lab results and a past experience with our front phone people who would not let her come in and be seen because she was experiencing a cough. We briefly discussed those occurrences.  In closing of our discussion today, patient and I make the following plan:  1.  She would like to stay with our clinic, knowing that her care will be shared by our providers for acute concerns until our new provider starts (which could be at least 6 months).  2.  She says she has not had a physical for at least 2 years "because of COVID" and was supposed to have her virtual with the [facility] ON 3/28 and her AWV with [facility] on 5/2.  Now that her PCP is leaving, she will once again have a care delay.  She wants to know if Dr. Diona Browner would see her at her next availability for her CPE, with the understanding that she would still establish with the new provider when they come in?    I responded that I am not sure that Dr. Diona Browner could do this but I would talk with the provider and see what she could do.  But, I am sure we could get her  in acutely to address any med refill or acute care needs that she may have until the new provider starts in the future.   3.  After speaking with Dr. Diona Browner regarding this patient's question, I will call patient back and discuss where we go from here.   Dr. Diona Browner,  Are you able to see patient for her AWV physical and be a part of her interim care management team until our new provider starts?  I am not sure if we are able to see a physical without an establish to care visit, but will defer to PCP, if she is able.  Patient does not want to delay her care further as she has chronic health issues that need following.    Please let me know your suggestions.

## 2021-02-01 NOTE — Telephone Encounter (Signed)
Patient left a message requesting a call back.

## 2021-02-01 NOTE — Telephone Encounter (Signed)
Attempted to reach patient to discuss. No answer and voicemail is not set up. Unfortunately, our providers are not currently taking new patients, so we are recommending patients of Webb Silversmith check with our other offices that are taking new patients to transfer. I do see where Harika is scheduled to transfer to [facility] in May.

## 2021-02-02 NOTE — Telephone Encounter (Signed)
Spoke with patient and made her aware that Dr. Diona Browner will take on her care.  Patient was so happy and thankful to our office and to Dr. Diona Browner for taking care of her.   I informed her that I would have my Audiological scientist, Raquel Sarna, call her tomorrow to set up the appointments as we discussed.  Patient states that she could tell that we are very sincere and are trying to do the best we can for our patients.

## 2021-02-02 NOTE — Telephone Encounter (Signed)
If I am doing a AMW may as well do a transfer of care at same time... put in 40 min OV at next open TOC slot I probably have one available before she is due 5/2? Right? If not I can add one in April.

## 2021-02-03 NOTE — Telephone Encounter (Signed)
Called and scheduled patient in May due to health adviser not having any availability before then. Patient was okay waiting until May. She thanked me for getting her scheduled for these appointments. We went ahead and cancelled her appointment to transfer to the [place] office, as well. Pt has been advised to reach out to our office if she needs anything in the meantime.

## 2021-02-08 ENCOUNTER — Telehealth: Payer: Medicare Other

## 2021-03-02 ENCOUNTER — Telehealth (INDEPENDENT_AMBULATORY_CARE_PROVIDER_SITE_OTHER): Payer: Medicare Other | Admitting: Family Medicine

## 2021-03-02 DIAGNOSIS — J45901 Unspecified asthma with (acute) exacerbation: Secondary | ICD-10-CM

## 2021-03-02 DIAGNOSIS — R0981 Nasal congestion: Secondary | ICD-10-CM

## 2021-03-02 DIAGNOSIS — R059 Cough, unspecified: Secondary | ICD-10-CM | POA: Diagnosis not present

## 2021-03-02 MED ORDER — AMOXICILLIN-POT CLAVULANATE 875-125 MG PO TABS: 1.0000 | ORAL_TABLET | Freq: Two times a day (BID) | ORAL | 0 refills | Status: DC

## 2021-03-02 MED ORDER — PREDNISONE 20 MG PO TABS: 40.0000 mg | ORAL_TABLET | Freq: Every day | ORAL | 0 refills | Status: DC

## 2021-03-02 NOTE — Patient Instructions (Signed)
-  I sent the medication(s) we discussed to your pharmacy: Meds ordered this encounter  Medications  . predniSONE (DELTASONE) 20 MG tablet    Sig: Take 2 tablets (40 mg total) by mouth daily with breakfast.    Dispense:  8 tablet    Refill:  0  . amoxicillin-clavulanate (AUGMENTIN) 875-125 MG tablet    Sig: Take 1 tablet by mouth 2 (two) times daily.    Dispense:  14 tablet    Refill:  0   Stop your other prednisone while you are taking this dose for 4 days.  Please check a COVID test.  Monitor your blood sugars.  If they are running high, please check with your primary care office to assist you.  I hope you are feeling better soon!  Seek in person care promptly or follow-up with your pulmonologist if your symptoms worsen, new concerns arise or you are not improving with treatment.  It was nice to meet you today. I help [facility] out with telemedicine visits on Tuesdays and Thursdays and am available for visits on those days. If you have any concerns or questions following this visit please schedule a follow up visit with your Primary Care doctor or seek care at a local urgent care clinic to avoid delays in care.

## 2021-03-02 NOTE — Progress Notes (Signed)
Virtual Visit via Telephone Note  I connected with Megan Bradshaw on 03/02/21 at 12:20 PM EDT by telephone and verified that I am speaking with the correct person using two identifiers.   I discussed the limitations, risks, security and privacy concerns of performing an evaluation and management service by telephone and the availability of in person appointments. I also discussed with the patient that there may be a patient responsible charge related to this service. The patient expressed understanding and agreed to proceed.  Location patient: home, [place] Location provider: work or home office Participants present for the call: patient, provider Patient did not have a visit with me in the prior 7 days to address this/these issue(s).   History of Present Illness:  Acute telemedicine visit for sinus issues: -Onset: about 5 days ago, but reports has chronic resp and sinus issues -Symptoms include: nausea, vomiting and diarrhea initially last week she thinks was from eating something, then some nasal congestion, sinus congestion, pnd, cough, SOB (reports always has this with her asthma when sick) using nebulizer 3 times per day - but still having some symptoms, not a lot of mucus production -Denies: CP, worsening SOB, fevers, inability to eat/drink/get out of bed, no known sick contacts -Has tried:her nebulizer, nasal saline -Pertinent past medical history: asthma - sees pulm; take 5 mg prednisone daily -Pertinent medication allergies: nkda -COVID-19 vaccine status: vaccinated x 3 and had flu shot -diabetic, but reports blood sugars have been in the 90-100s and have been good -reports usually requires prednisone and antibiotic when gets sick like this - she does not feel that she has flu or covid and wants an antibiotic and prednisone   Observations/Objective: Patient sounds cheerful and well on the phone. I do not appreciate any SOB. Speech and thought processing are grossly intact. Patient  reported vitals:  Assessment and Plan:  Cough  Nasal congestion  Asthma with acute exacerbation, unspecified asthma severity, unspecified whether persistent  -we discussed possible serious and likely etiologies, options for evaluation and workup, limitations of telemedicine visit vs in person visit, treatment, treatment risks and precautions. Pt prefers to treat via telemedicine empirically rather than in person at this moment. Query influenza, covid, other resp infection vs other. She feels strongly this is a resp infection from her chronic issues and declines evaluation inperson at [facility] or covid testing and prefers to try and antibiotic and increased prednisone for 4 days as she reports this is what usually works for her. She is frustrated that her PCP office and pulmonary office have not been able to offer her inperson visits during the pandemic when she is sick. Discussed risks/portential complications and precautions. Scheduled follow up with PCP offered:  Advised to seek prompt in person care if worsening, new symptoms arise, or if is not improving with treatment. Advised of options for inperson care in case PCP office not available. Did let the patient know that I only do telemedicine shifts for [facility] on Tuesdays and Thursdays and advised a follow up visit with PCP or at an [facility] if has further questions or concerns.   Follow Up Instructions:  I did not refer this patient for an OV with me in the next 24 hours for this/these issue(s).  I discussed the assessment and treatment plan with the patient. The patient was provided an opportunity to ask questions and all were answered. The patient agreed with the plan and demonstrated an understanding of the instructions.   I spent 18 minutes on the date  of this visit in the care of this patient. See summary of tasks completed to properly care for this patient in the detailed notes above which also included counseling of above, review of PMH,  medications, allergies, evaluation of the patient and ordering and/or  instructing patient on testing and care options.     Lucretia Kern, DO

## 2021-03-10 ENCOUNTER — Other Ambulatory Visit: Payer: Self-pay | Admitting: Internal Medicine

## 2021-03-10 NOTE — Telephone Encounter (Signed)
Pt has upcoming TOC appt May 2022...Marland Kitchen please advise

## 2021-03-15 ENCOUNTER — Encounter: Payer: Medicare Other | Admitting: Internal Medicine

## 2021-03-19 ENCOUNTER — Ambulatory Visit: Payer: Medicare Other

## 2021-03-19 ENCOUNTER — Other Ambulatory Visit: Payer: Self-pay | Admitting: Family

## 2021-03-19 ENCOUNTER — Other Ambulatory Visit: Payer: Self-pay | Admitting: Podiatry

## 2021-03-19 ENCOUNTER — Other Ambulatory Visit: Payer: Self-pay

## 2021-03-22 LAB — HM DIABETES FOOT EXAM

## 2021-03-22 NOTE — Telephone Encounter (Signed)
Please Advise

## 2021-03-23 NOTE — Telephone Encounter (Signed)
Pt requesting refill on prednisone 5 mg; last refill done on 12/01/20 # 90 x 0 by Avie Echevaria NP. Pt said she takes the prednisone 5 mg every day. I found on pts med list where the prednisone 5 mg was filled by Ms Justin Mend NP on 03/10/21; I spoke with Levada Dy at [facility] [address] and she said that the pt had a prednisone 5 mg # 90 rx on hold. Levada Dy was not sure why another refill was requested on 03/19/21 since had one rx on hold for pt. Levada Dy said prednisone 5 mg would be ready for pick up any time today after 12 Noon. Pt given this info and very appreciative and pt will pick up her meds this afternoon at [facility] [address]. Nothing further needed at this time.

## 2021-03-24 ENCOUNTER — Ambulatory Visit: Payer: Medicare Other | Admitting: Adult Health

## 2021-03-26 ENCOUNTER — Other Ambulatory Visit: Payer: Self-pay

## 2021-03-26 ENCOUNTER — Encounter: Payer: Self-pay | Admitting: Family Medicine

## 2021-03-26 ENCOUNTER — Ambulatory Visit (INDEPENDENT_AMBULATORY_CARE_PROVIDER_SITE_OTHER): Payer: Medicare Other | Admitting: Family Medicine

## 2021-03-26 VITALS — BP 130/72 | HR 83 | Temp 98.2°F | Ht 62.0 in | Wt 190.5 lb

## 2021-03-26 DIAGNOSIS — J449 Chronic obstructive pulmonary disease, unspecified: Secondary | ICD-10-CM | POA: Diagnosis not present

## 2021-03-26 DIAGNOSIS — M1990 Unspecified osteoarthritis, unspecified site: Secondary | ICD-10-CM | POA: Diagnosis not present

## 2021-03-26 DIAGNOSIS — Z0000 Encounter for general adult medical examination without abnormal findings: Secondary | ICD-10-CM | POA: Diagnosis not present

## 2021-03-26 DIAGNOSIS — E2839 Other primary ovarian failure: Secondary | ICD-10-CM | POA: Diagnosis not present

## 2021-03-26 DIAGNOSIS — N3281 Overactive bladder: Secondary | ICD-10-CM | POA: Diagnosis not present

## 2021-03-26 DIAGNOSIS — E1140 Type 2 diabetes mellitus with diabetic neuropathy, unspecified: Secondary | ICD-10-CM | POA: Diagnosis not present

## 2021-03-26 DIAGNOSIS — E039 Hypothyroidism, unspecified: Secondary | ICD-10-CM | POA: Diagnosis not present

## 2021-03-26 DIAGNOSIS — E099 Drug or chemical induced diabetes mellitus without complications: Secondary | ICD-10-CM

## 2021-03-26 DIAGNOSIS — T380X5D Adverse effect of glucocorticoids and synthetic analogues, subsequent encounter: Secondary | ICD-10-CM | POA: Diagnosis not present

## 2021-03-26 LAB — COMPREHENSIVE METABOLIC PANEL
ALT: 20 U/L (ref 0–35)
AST: 21 U/L (ref 0–37)
Albumin: 4.4 g/dL (ref 3.5–5.2)
Alkaline Phosphatase: 69 U/L (ref 39–117)
BUN: 27 mg/dL — ABNORMAL HIGH (ref 6–23)
CO2: 27 mEq/L (ref 19–32)
Calcium: 9.4 mg/dL (ref 8.4–10.5)
Chloride: 101 mEq/L (ref 96–112)
Creatinine, Ser: 0.86 mg/dL (ref 0.40–1.20)
GFR: 60.18 mL/min (ref >60.00)
Glucose, Bld: 204 mg/dL — ABNORMAL HIGH (ref 70–99)
Potassium: 5 mEq/L (ref 3.5–5.1)
Sodium: 136 mEq/L (ref 135–145)
Total Bilirubin: 0.4 mg/dL (ref 0.2–1.2)
Total Protein: 7.3 g/dL (ref 6.0–8.3)

## 2021-03-26 LAB — HEMOGLOBIN A1C: Hgb A1c MFr Bld: 8.5 % — ABNORMAL HIGH (ref 4.6–6.5)

## 2021-03-26 MED ORDER — MONTELUKAST SODIUM 10 MG PO TABS
10.0000 mg | ORAL_TABLET | Freq: Every day | ORAL | 3 refills | Status: DC
Start: 2021-03-26 — End: 2021-04-13

## 2021-03-26 NOTE — Assessment & Plan Note (Signed)
Stable, chronic.  Continue current medication. ? ? ?Levothyroxine 75 mcg daily ?

## 2021-03-26 NOTE — Assessment & Plan Note (Signed)
Tolerable control. 

## 2021-03-26 NOTE — Patient Instructions (Addendum)
Please stop at the lab to have labs drawn. Set up yearly eye exam for diabetes and have the opthalmologist send [place] a copy of the evaluation for the chart. We will set up bone density for you.  Can use Voltaren gel OTC four times daily on ankles  Increase Neurontin to 100 mg in AM and 200 mg at bedtime.

## 2021-03-26 NOTE — Assessment & Plan Note (Signed)
Due for re-eval. 

## 2021-03-26 NOTE — Assessment & Plan Note (Signed)
Stable control on Breo inhaler and low dose prednisone 5 mg daily.Marland Kitchen

## 2021-03-26 NOTE — Progress Notes (Signed)
Patient ID: Megan Bradshaw, female    DOB: 1930-04-10, 87 y.o.   MRN: [id number]  This visit was conducted in person.  Pulse 83   Temp 98.2 F (36.8 C) (Temporal)   Ht '5\' 2"'  (1.575 m)   Wt 190 lb 8 oz (86.4 kg)   SpO2 95%   BMI 34.84 kg/m    CC:  Chief Complaint  Patient presents with   Medicare Wellness    Subjective:   HPI: Megan Bradshaw is a 87 y.o. female presenting on 03/26/2021 for Medicare Wellness  The patient presents for annual medicare wellness, complete physical and review of chronic health problems. He/She also has the following acute concerns today: none  I have personally reviewed the Medicare Annual Wellness questionnaire and have noted 1. The patient's medical and social history 2. Their use of alcohol, tobacco or illicit drugs 3. Their current medications and supplements 4. The patient's functional ability including ADL's, fall risks, home safety risks and hearing or visual             impairment. 5. Diet and physical activities 6. Evidence for depression or mood disorders 7.         Updated provider list Cognitive evaluation was performed and recorded on pt medicare questionnaire form. The patients weight, height, BMI and visual acuity have been recorded in the chart   I have made referrals, counseling and provided education to the patient based review of the above and I have provided the pt with a written personalized care plan for preventive services.   Documentation of this information was scanned into the electronic record under the media tab.   Advance directives and end of life planning reviewed in detail with patient and documented in EMR. Patient given handout on advance care directives if needed. HCPOA and living will updated if needed.  [facility] Office Visit from 03/26/2021 in [facility] [place]  PHQ-2 Total Score 0      Fall Risk  03/26/2021 10/09/2019 06/14/2019 12/07/2017 09/01/2016  Falls in the past year? 0 0 (No  Data) No No  Comment - Emmi Telephone Survey: data to providers prior to load Emmi Telephone Survey: data to providers prior to load - -  Number falls in past yr: - - (No Data) - -  Comment - - Emmi Telephone Survey Actual Response =  - -     Steroid induced DM  Due for re-eval. Sugar has improved  With changes in eating... using glipizide 10 Xl, metfomrin 500 mg daily Lab Results  Component Value Date   HGBA1C 7.6 (H) 12/15/2020   Using medications without difficulties: Hypoglycemic episodes: rare Hyperglycemic episodes: none Feet problems: no ulcers.. but has foot pain Blood Sugars averaging: FBS 107-116 eye exam within last year:  Reviewed cholesterol level .Marland Kitchen LDL at goal < 100 Not on statin. She refuses to start. Lab Results  Component Value Date   CHOL 168 12/15/2020   HDL 54.70 12/15/2020   LDLCALC 80 12/15/2020   TRIG 170.0 (H) 12/15/2020   CHOLHDL 3 12/15/2020   Walking on treadmill.. plans to start water aerobics.  Stage 2 COPD:  Followed by Pulmonary.Marland Kitchen on Breo, prednisone, iSngulair, albuterol/Duonebs TID prn.   Hypothyroid  Levo 75 mcg daily Lab Results  Component Value Date   TSH 3.23 12/15/2020        Relevant past medical, surgical, family and social history reviewed and updated as indicated. Interim medical history since our last visit reviewed. Allergies  and medications reviewed and updated. Outpatient Medications Prior to Visit  Medication Sig Dispense Refill   Accu-Chek Softclix Lancets lancets as directed.     albuterol (PROVENTIL) (2.5 MG/3ML) 0.083% nebulizer solution Take 3 mLs (2.5 mg total) by nebulization every 4 (four) hours as needed for wheezing or shortness of breath. 150 mL 5   albuterol (VENTOLIN HFA) 108 (90 Base) MCG/ACT inhaler INHALE 1 TO 2 PUFFS BY MOUTH EVERY 6 HOURS FOR PAIN OR WHEEZING OR SHORTNESS OF BREATH 18 g 1   benzonatate (TESSALON) 200 MG capsule Take 1 capsule (200 mg total) by mouth 3 (three) times daily as needed for  cough. 30 capsule 1   Blood Glucose Monitoring Suppl (ACCU-CHEK GUIDE ME) w/Device KIT See admin instructions.     Cholecalciferol (VITAMIN D PO) Take 1 tablet by mouth daily.     clobetasol cream (TEMOVATE) 4.65 % Apply 1 application topically daily as needed. 30 g 1   dextromethorphan (DELSYM) 30 MG/5ML liquid Take 30 mg by mouth as needed for cough.     gabapentin (NEURONTIN) 100 MG capsule TAKE 1 CAPSULE(100 MG) BY MOUTH THREE TIMES DAILY 90 capsule 3   glipiZIDE (GLUCOTROL XL) 10 MG 24 hr tablet TAKE 1 TABLET(10 MG) BY MOUTH DAILY WITH BREAKFAST 90 tablet 0   glucose blood (ACCU-CHEK GUIDE) test strip 1 each by Other route 2 (two) times daily. 200 each 2   ipratropium-albuterol (DUONEB) 0.5-2.5 (3) MG/3ML SOLN USE 3 ML VIA NEBULIZER EVERY 6 HOURS AS NEEDED 360 mL 1   levothyroxine (SYNTHROID) 75 MCG tablet TAKE 1 TABLET BY MOUTH EVERY DAY BEFORE BREAKFAST 90 tablet 0   loratadine (CLARITIN) 10 MG tablet Take 10 mg by mouth daily.     metFORMIN (GLUCOPHAGE) 500 MG tablet Take 500 mg by mouth 2 (two) times daily with a meal.     montelukast (SINGULAIR) 10 MG tablet Take 1 tablet (10 mg total) by mouth at bedtime. 30 tablet 3   Multiple Vitamin (MULTIVITAMIN) capsule Take 1 capsule by mouth daily.     nystatin (NYSTATIN) powder APPLY EXTERNALLY TO THE AFFECTED AREA TWICE DAILY 30 g 1   omeprazole (PRILOSEC) 20 MG capsule Take 20 mg by mouth daily.     OVER THE COUNTER MEDICATION Take by mouth. Musinex liquid twice daily as needed     predniSONE (DELTASONE) 20 MG tablet Take 2 tablets (40 mg total) by mouth daily with breakfast. 8 tablet 0   predniSONE (DELTASONE) 5 MG tablet TAKE 1 TABLET(5 MG) BY MOUTH DAILY WITH BREAKFAST 90 tablet 0   amoxicillin-clavulanate (AUGMENTIN) 875-125 MG tablet Take 1 tablet by mouth 2 (two) times daily. 14 tablet 0   No facility-administered medications prior to visit.     Per HPI unless specifically indicated in ROS section below Review of Systems   Constitutional: Negative for fatigue and fever.  HENT: Negative for congestion.   Eyes: Negative for pain.  Respiratory: Positive for shortness of breath and wheezing. Negative for cough.   Cardiovascular: Negative for chest pain, palpitations and leg swelling.  Gastrointestinal: Negative for abdominal pain.  Genitourinary: Negative for dysuria and vaginal bleeding.  Musculoskeletal: Negative for back pain.  Skin: Positive for rash.       Bumps on arms, under skin, no pain, not itchy  Neurological: Negative for syncope, light-headedness and headaches.  Psychiatric/Behavioral: Negative for dysphoric mood.   Objective:  Pulse 83   Temp 98.2 F (36.8 C) (Temporal)   Ht '5\' 2"'  (1.575 m)  Wt 190 lb 8 oz (86.4 kg)   SpO2 95%   BMI 34.84 kg/m   Wt Readings from Last 3 Encounters:  03/26/21 190 lb 8 oz (86.4 kg)  12/15/20 192 lb (87.1 kg)  12/02/20 189 lb 9.6 oz (86 kg)      Physical Exam Constitutional:      General: She is not in acute distress.    Appearance: Normal appearance. She is well-developed. She is obese. She is not ill-appearing or toxic-appearing.  HENT:     Head: Normocephalic.     Right Ear: Hearing, tympanic membrane, ear canal and external ear normal. Tympanic membrane is not erythematous, retracted or bulging.     Left Ear: Hearing, tympanic membrane, ear canal and external ear normal. Tympanic membrane is not erythematous, retracted or bulging.     Nose: No mucosal edema or rhinorrhea.     Right Sinus: No maxillary sinus tenderness or frontal sinus tenderness.     Left Sinus: No maxillary sinus tenderness or frontal sinus tenderness.     Mouth/Throat:     Pharynx: Uvula midline.  Eyes:     General: Lids are normal. Lids are everted, no foreign bodies appreciated.     Conjunctiva/sclera: Conjunctivae normal.     Pupils: Pupils are equal, round, and reactive to light.  Neck:     Thyroid: No thyroid mass or thyromegaly.     Vascular: No carotid bruit.      Trachea: Trachea normal.  Cardiovascular:     Rate and Rhythm: Normal rate and regular rhythm.     Pulses: Normal pulses.     Heart sounds: Normal heart sounds, S1 normal and S2 normal. No murmur heard. No friction rub. No gallop.   Pulmonary:     Effort: Pulmonary effort is normal. No tachypnea or respiratory distress.     Breath sounds: Normal breath sounds. No decreased breath sounds, wheezing, rhonchi or rales.  Abdominal:     General: Bowel sounds are normal.     Palpations: Abdomen is soft.     Tenderness: There is no abdominal tenderness.  Musculoskeletal:     Cervical back: Normal range of motion and neck supple.  Skin:    General: Skin is warm and dry.     Findings: No rash.  Neurological:     Mental Status: She is alert.  Psychiatric:        Mood and Affect: Mood is not anxious or depressed.        Speech: Speech normal.        Behavior: Behavior normal. Behavior is cooperative.        Thought Content: Thought content normal.        Judgment: Judgment normal.      Diabetic foot exam: Normal inspection No skin breakdown Multiple calluses  Normal DP pulses Normal sensation to light touch and monofilament Nails thickened     Results for orders placed or performed in visit on 12/15/20  TSH  Result Value Ref Range   TSH 3.23 0.35 - 4.50 uIU/mL  T4, free  Result Value Ref Range   Free T4 0.85 0.60 - 1.60 ng/dL  Lipid panel  Result Value Ref Range   Cholesterol 168 0 - 200 mg/dL   Triglycerides 170.0 (H) 0.0 - 149.0 mg/dL   HDL 54.70 >39.00 mg/dL   VLDL 34.0 0.0 - 40.0 mg/dL   LDL Cholesterol 80 0 - 99 mg/dL   Total CHOL/HDL Ratio 3    NonHDL 113.69  Hemoglobin A1c  Result Value Ref Range   Hgb A1c MFr Bld 7.6 (H) 4.6 - 6.5 %  Microalbumin / creatinine urine ratio  Result Value Ref Range   Microalb, Ur <0.7 0.0 - 1.9 mg/dL   Creatinine,U 77.3 mg/dL   Microalb Creat Ratio 0.9 0.0 - 30.0 mg/g    This visit occurred during the SARS-CoV-2 public health  emergency.  Safety protocols were in place, including screening questions prior to the visit, additional usage of staff PPE, and extensive cleaning of exam room while observing appropriate contact time as indicated for disinfecting solutions.   COVID 19 screen:  No recent travel or known exposure to COVID19 The patient denies respiratory symptoms of COVID 19 at this time. The importance of social distancing was discussed today.   Assessment and Plan The patient's preventative maintenance and recommended screening tests for an annual wellness exam were reviewed in full today. Brought up to date unless services declined.  Counselled on the importance of diet, exercise, and its role in overall health and mortality. The patient's FH and SH was reviewed, including their home life, tobacco status, and drug and alcohol status.    Vaccines:   COVID x 3, PNA x 2, flu 2021, Td due Pap/DVE:  Not indicated Mammo: not indicated Bone Density: DUE Colon:  Not indicated given age Smoking Status: former, aged out of lung cancer screening CT program ETOH/ drug use: none/none  Hep C:  Done in past   Problem List Items Addressed This Visit     Arthritis   Hypothyroid    Stable, chronic.  Continue current medication.   Levothyroxine 75 mcg daily.       Neuropathy due to type 2 diabetes mellitus ([place])    Moderately controlled on Neurontin...  Takes 200 mg at bedtime.       OAB (overactive bladder)     Tolerable control.       Stage 2 moderate COPD by GOLD classification ([place])     Stable control on Breo inhaler and low dose prednisone 5 mg daily..       Relevant Medications   BREO ELLIPTA 100-25 MCG/INH AEPB   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) ([place])    Due for re-eval.       Relevant Orders   Hemoglobin A1c (Completed)   Comprehensive metabolic panel (Completed)   Other Visit Diagnoses     Medicare annual wellness visit, subsequent    -   Primary   Estrogen deficiency       Relevant Orders   DG Bone Density          Eliezer Lofts, MD

## 2021-03-26 NOTE — Assessment & Plan Note (Signed)
Moderately controlled on Neurontin...  Takes 200 mg at bedtime.

## 2021-04-10 ENCOUNTER — Other Ambulatory Visit: Payer: Self-pay | Admitting: Pulmonary Disease

## 2021-04-26 ENCOUNTER — Telehealth (INDEPENDENT_AMBULATORY_CARE_PROVIDER_SITE_OTHER): Payer: Medicare Other | Admitting: Family Medicine

## 2021-04-26 ENCOUNTER — Encounter: Payer: Self-pay | Admitting: Family Medicine

## 2021-04-26 VITALS — Temp 99.9°F | Ht 62.0 in

## 2021-04-26 DIAGNOSIS — U071 COVID-19: Secondary | ICD-10-CM | POA: Diagnosis not present

## 2021-04-26 MED ORDER — MOLNUPIRAVIR EUA 200MG CAPSULE: 4.0000 | ORAL_CAPSULE | Freq: Two times a day (BID) | ORAL | 0 refills | Status: AC

## 2021-04-26 NOTE — Progress Notes (Signed)
      Megan Bradshaw T. Megan Wentland, MD Primary Care and [place] at [facility] [place], [address] Phone: [phone]  FAX: [phone]  Megan Bradshaw - 87 y.o. female  MRN [id number]  Date of Birth: [birth year]  Visit Date: 04/26/2021  PCP: Jinny Sanders, MD  Referred by: Jinny Sanders, MD  Virtual Visit via Video Note:  I connected with  Megan Bradshaw on 04/26/2021  9:00 AM EDT by a video enabled telemedicine application and verified that I am speaking with the correct person using two identifiers.   Location patient: home computer, tablet, or smartphone Location provider: work or home office Consent: Verbal consent directly obtained from [name] Persons participating in the virtual visit: patient, provider  I discussed the limitations of evaluation and management by telemedicine and the availability of in person appointments. The patient expressed understanding and agreed to proceed.  Chief Complaint  Patient presents with   Covid Positive    Positive Home Test Yesterday   Fever   Chills   Cough    History of Present Illness:  2 tests that were +. Friday started to feel bad.   Not being able to do for herself. Fever - coughing a lot. Temp was 100 a few days ago.  Today it is 99.9 F She is coughing a lot, she has a lot of nasal congestion.  She denies any GI symptoms or loss of taste and smell.  Nebs 3 times a day - 2 yesterday.  Immunization History  Administered Date(s) Administered   Fluad Quad(high Dose 65+) 09/03/2020   Influenza,inj,Quad PF,6+ Mos 08/20/2015, 07/22/2016, 08/14/2017, 08/23/2018, 08/29/2019   Influenza-Unspecified 08/14/2014   PFIZER(Purple Top)SARS-COV-2 Vaccination 01/07/2020, 01/28/2020, 10/11/2020   Pneumococcal Conjugate-13 08/20/2015   Pneumococcal Polysaccharide-23 08/29/2019     Review of Systems as above: See pertinent positives and pertinent negatives per HPI No  acute distress verbally   Observations/Objective/Exam:  An attempt was made to discern vital signs over the phone and per patient if applicable and possible.   General:    Alert, Oriented, appears well and in no acute distress  Pulmonary:     On inspection no signs of respiratory distress.  Psych / Neurological:     Pleasant and cooperative.  Assessment and Plan:    ICD-10-CM   1. COVID-19  U07.1      She is a high risk this patient is a 72 with diabetes.  Placed on antivirals, continue with albuterol/DuoNeb 3 times a day for now, and continue supportive care.  I discussed the assessment and treatment plan with the patient. The patient was provided an opportunity to ask questions and all were answered. The patient agreed with the plan and demonstrated an understanding of the instructions.   The patient was advised to call back or seek an in-person evaluation if the symptoms worsen or if the condition fails to improve as anticipated.  Follow-up: prn unless noted otherwise below No follow-ups on file.  Meds ordered this encounter  Medications   molnupiravir EUA 200 mg CAPS    Sig: Take 4 capsules (800 mg total) by mouth 2 (two) times daily for 5 days.    Dispense:  40 capsule    Refill:  0   No orders of the defined types were placed in this encounter.   Signed,  Maud Deed. Avian Konigsberg, MD

## 2021-04-28 ENCOUNTER — Other Ambulatory Visit: Payer: Self-pay

## 2021-04-28 ENCOUNTER — Telehealth: Payer: Self-pay | Admitting: *Deleted

## 2021-04-28 ENCOUNTER — Ambulatory Visit: Payer: Medicare Other

## 2021-04-28 MED ORDER — PREDNISONE 20 MG PO TABS: ORAL_TABLET | ORAL | 0 refills | Status: DC

## 2021-04-28 NOTE — Telephone Encounter (Signed)
Megan Bradshaw notified as instructed by telephone.  Patient states understanding.

## 2021-04-28 NOTE — Telephone Encounter (Signed)
Patient called stating that she has covid and did a visit with Dr. Lorelei Pont the other day. Patient stated that she is not any better and has been taking the medication prescribed. Patient stated that she has a terrible sore throat, cough/yellow sinus drainage/green. Patient denies a fever. Patient stated that she has COPD and her SOB is not any worse that it usually is. Patient stated that she only slept about 4 hours last night because she feels so bad. Patient stated when she first gets up in the morning she feels terrible but feels a little better as the day goes on. Patient wants to know if there is anything else that she can take to make her feel better. Pharmacy [facility]

## 2021-04-28 NOTE — Telephone Encounter (Signed)
Can you call her.  There is nothing that cures Covid-19.  The main reason to take the antivirals that I gave to her before are to decrease risk of hospitalization and may help feel somewhat better only.  I did send her in some prednisone, which might help with breathing and open her upper respiratory tract, too.

## 2021-05-15 ENCOUNTER — Other Ambulatory Visit: Payer: Self-pay | Admitting: Pulmonary Disease

## 2021-05-31 ENCOUNTER — Ambulatory Visit
Admission: RE | Admit: 2021-05-31 | Discharge: 2021-05-31 | Disposition: A | Discharge status: Home / Self Care | Payer: Medicare Other | Source: Ambulatory Visit | Attending: Pulmonary Disease | Admitting: Pulmonary Disease

## 2021-05-31 ENCOUNTER — Encounter: Payer: Self-pay | Admitting: Pulmonary Disease

## 2021-05-31 ENCOUNTER — Ambulatory Visit (INDEPENDENT_AMBULATORY_CARE_PROVIDER_SITE_OTHER): Payer: Medicare Other | Admitting: Pulmonary Disease

## 2021-05-31 ENCOUNTER — Other Ambulatory Visit: Payer: Self-pay

## 2021-05-31 VITALS — BP 132/68 | HR 87 | Temp 97.7°F | Ht 62.0 in | Wt 187.4 lb

## 2021-05-31 DIAGNOSIS — R0602 Shortness of breath: Secondary | ICD-10-CM | POA: Diagnosis not present

## 2021-05-31 DIAGNOSIS — R059 Cough, unspecified: Secondary | ICD-10-CM | POA: Diagnosis not present

## 2021-05-31 DIAGNOSIS — R053 Chronic cough: Secondary | ICD-10-CM

## 2021-05-31 DIAGNOSIS — Z8616 Personal history of COVID-19: Secondary | ICD-10-CM | POA: Diagnosis not present

## 2021-05-31 DIAGNOSIS — S299XXA Unspecified injury of thorax, initial encounter: Secondary | ICD-10-CM | POA: Insufficient documentation

## 2021-05-31 DIAGNOSIS — J449 Chronic obstructive pulmonary disease, unspecified: Secondary | ICD-10-CM | POA: Diagnosis not present

## 2021-05-31 DIAGNOSIS — R079 Chest pain, unspecified: Secondary | ICD-10-CM | POA: Diagnosis not present

## 2021-05-31 MED ORDER — FLUTICASONE FUROATE-VILANTEROL 200-25 MCG/INH IN AEPB: 1.0000 | INHALATION_SPRAY | Freq: Every day | RESPIRATORY_TRACT | 0 refills | Status: AC

## 2021-05-31 NOTE — Patient Instructions (Signed)
We are increasing the strength of your Breo to the 200 strength.  Still 1 puff daily make sure you rinse your mouth well after you use it.  Lease let us know how you do with this so that we can call in the prescription for you do not get the 100 strength filled.  We are getting x-rays of your ribs to make sure there is no fracture on them.   We will see him in follow-up in 2 to 3 months time call sooner should any new problems arise.

## 2021-05-31 NOTE — Progress Notes (Signed)
Subjective:    Patient ID: Megan Bradshaw, female    DOB: [birth year], 87 y.o.   MRN: [id number] Chief Complaint  Patient presents with   Follow-up    Hx of covid 68moago. C/o sob with exertion, dry cough and wheezing   Pt profile: 87y.o. F, former [place]oker[place] by Dr WMelvyn Novas Diagnosed with COPD in 2011. Graded as Gold I by Dr.  WMelvyn Novas Chronic prednisone therapy for non-pulmonary diagnosis.  Last PFTs 02/16/2016.  Followed by Dr. SAlva Garnetsince October 2016. Established with me in November 2020 after Dr. SAlva Garnet departure from the practice.   PROBLEMS: Gold II COPD with chronic asthmatic bronchitis Chronic refractory cough Class III dyspnea out of proportion to PFT parameters   DATA: 10/22/14 PFTs: mild obstruction, FEV1 1.36 L (81%), FEV1/FVC 53%, TLC normal, DLCO 51% pred 02/16/16 PFTs: FVC: 2.23 L (90 %pred), FEV1: 1.30 L (71 %pred), FEV1/FVC: 58% , TLC: invalid, DLCO 78% pred, consistent with mild/moderate obstructive defect. 10/14/2019 2D echo: LVEF 50 to 527% normal diastolics, normal pulmonary artery systolic pressure 074/12/8786overnight oximetry on RA: No oxygen desaturations of clinical significance.  Lowest O2 sat 88% of less than 2 minutes duration.  Average O2 sat 93%   INTERVAL: Last seen in this office 12/02/2020 by me.  Since her last visit she had COVID-19, 2 months ago.  Did not require hospitalization.  HPI Ms. CSolivanis an 87year old former smoker ([id number]) who presents for follow-up on the issue of dyspnea and chronic asthmatic bronchitis/stage II COPD.  This is a scheduled visit.  The patient presents with her usual complaint of dyspnea however, this is not over her usual.  Her main issue today is that since her COVID-19 diagnosis she has had worsening of her baseline cough.  She does not have purulent sputum production.  No hemoptysis.  She also notes that on 15 July while going to the bathroom she fell and hit the edge of the vanity and has  some persistent right-sided chest pain.  She does not have any other symptomatology except pain that is localized to the area she injured.  No bruising noted.  She did not seek medical attention at the time.  Has not had any imaging.  Review of Systems A 10 point review of systems was performed and it is as noted above otherwise negative.  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   albuterol (PROVENTIL) (2.5 MG/3ML) 0.083% nebulizer solution Take 3 mLs (2.5 mg total) by nebulization every 4 (four) hours as needed for wheezing or shortness of breath.   albuterol (VENTOLIN HFA) 108 (90 Base) MCG/ACT inhaler INHALE 1 TO 2  PUFFS BY MOUTH EVERY 6 HOURS FOR PAIN OR WHEEZING OR SHORTNESS OF BREATH   benzonatate (TESSALON) 200 MG capsule Take 1 capsule (200 mg total) by mouth 3 (three) times daily as needed for cough.   Blood Glucose Monitoring Suppl (ACCU-CHEK GUIDE ME) w/Device KIT See admin instructions.   BREO ELLIPTA 100-25 MCG/INH AEPB Inhale 1 puff into the lungs daily.   Cholecalciferol (VITAMIN D PO) Take 1 tablet by mouth daily.   clobetasol cream (TEMOVATE) 8.85 % Apply 1 application topically daily as needed.   dextromethorphan (DELSYM) 30 MG/5ML  liquid Take 30 mg by mouth as needed for cough.   gabapentin (NEURONTIN) 100 MG capsule TAKE 1 CAPSULE(100 MG) BY MOUTH THREE TIMES DAILY   glipiZIDE (GLUCOTROL XL) 10 MG 24 hr tablet TAKE 1 TABLET(10 MG) BY MOUTH DAILY WITH BREAKFAST   glucose blood (ACCU-CHEK GUIDE) test strip 1 each by Other route 2 (two) times daily.   ipratropium-albuterol (DUONEB) 0.5-2.5 (3) MG/3ML SOLN USE 3 ML VIA NEBULIZER EVERY 6 HOURS AS NEEDED   levothyroxine (SYNTHROID) 75 MCG tablet TAKE 1 TABLET BY MOUTH EVERY DAY BEFORE BREAKFAST   metFORMIN (GLUCOPHAGE) 500 MG tablet Take 500 mg by mouth daily with breakfast.   montelukast (SINGULAIR) 10 MG tablet TAKE 1 TABLET(10 MG) BY MOUTH AT BEDTIME   Multiple Vitamin (MULTIVITAMIN) capsule Take 1 capsule by mouth daily.   nystatin (NYSTATIN) powder APPLY EXTERNALLY TO THE AFFECTED AREA TWICE DAILY   omeprazole (PRILOSEC) 20 MG capsule Take 20 mg by mouth daily.   predniSONE (DELTASONE) 5 MG tablet Take 5 mg by mouth daily with breakfast.   [DISCONTINUED] predniSONE (DELTASONE) 20 MG tablet 2 tabs po for 4 days, then 1 tab po for 4 days   Immunization History  Administered Date(s) Administered   Fluad Quad(high Dose 65+) 09/03/2020   Influenza,inj,Quad PF,6+ Mos 08/20/2015, 07/22/2016, 08/14/2017, 08/23/2018, 08/29/2019   Influenza-Unspecified 08/14/2014   PFIZER(Purple Top)SARS-COV-2 Vaccination 01/07/2020, 01/28/2020, 10/11/2020   Pneumococcal Conjugate-13 08/20/2015   Pneumococcal Polysaccharide-23 08/29/2019       Objective:   Physical Exam BP 132/68 (BP Location: Left Arm, Cuff Size: Large)   Pulse 87   Temp 97.7 F (36.5 C) (Temporal)   Ht $R'5\' 2"'wf$  (1.575 m)   Wt 187 lb 6.4 oz (85 kg)   SpO2 95%   BMI 34.28 kg/m  GENERAL: Overweight elderly female, quite spry.  Presents in transport chair. No conversational dyspnea. No distress. HEAD: Normocephalic, atraumatic. EYES: Pupils equal, round, reactive to light.  No scleral icterus. MOUTH: Nose/mouth/throat  not examined due to masking requirements for COVID 19. NECK: Supple. No thyromegaly. Trachea midline. No JVD.  No adenopathy. PULMONARY: Good air entry bilaterally. Coarse with no other adventitious sounds. CARDIOVASCULAR: S1 and S2. Regular rate and rhythm. Soft grade 1/6 systolic ejection murmur left sternal border. No rubs or gallops noted. ABDOMEN: Obese,nondistended. MUSCULOSKELETAL: No joint deformity, no clubbing, no edema.  She has increased AP diameter due to kyphosis.  There is tenderness along the lower right chest wall with no crepitus. NEUROLOGIC: No overt focal deficits noted. Speech is fluent. SKIN: Intact,warm,dry. On limited exam no rashes. PSYCH: Mood and behavior normal     Assessment & Plan:     ICD-10-CM   1. Stage 2 moderate COPD by GOLD classification ([place])  J44.9    She has an element of asthmatic bronchitis Increase Breo Ellipta to 200/25 1 inhalation daily, rinse mouth well after use DuoNeb as needed     2. Chronic cough  R05.3    This has worsened after COVID-19 Increased ICS component and Breo as above OTC cough suppressants as needed    3. SOB (shortness of breath)  R06.02    This remains at baseline Notify if worsening vis--vis recent injury    4. Trauma of chest, initial encounter  S29.9XXA DG Chest 2 View    DG Ribs Unilateral Right   Chest x-ray PA and lateral Right rib detail rule out fracture    5. Personal history of COVID-19  Z86.16    This issue adds complexity to her management     Orders Placed This Encounter  Procedures   DG Chest 2 View    Standing Status:   Future    Number of Occurrences:   1    Standing Expiration Date:   12/01/2021    Order Specific Question:   Reason for Exam (SYMPTOM  OR DIAGNOSIS REQUIRED)    Answer:   copd    Order Specific Question:   Preferred imaging location?    Answer:   [facility]   DG Ribs Unilateral Right    Standing Status:   Future    Number of Occurrences:   1    Standing Expiration  Date:   12/01/2021    Order Specific Question:   Reason for Exam (SYMPTOM  OR DIAGNOSIS REQUIRED)    Answer:   chest trauma    Order Specific Question:   Preferred imaging location?    Answer:   [facility] ordered this encounter  Medications   fluticasone furoate-vilanterol (BREO ELLIPTA) 200-25 MCG/INH AEPB    Sig: Inhale 1 puff into the lungs daily for 1 day.    Dispense:  14 each    Refill:  0    Order Specific Question:   Lot Number?    Answer:   [id number]    Order Specific Question:   Expiration Date?    Answer:   08/14/2021    Order Specific Question:   Manufacturer?    Answer:   [employer] [[id number]]    Order Specific Question:   Quantity    Answer:   2   The patient overall appears to be well compensated however has had issues with worsening cough particularly after COVID-19 infection 2 months ago.  We have increased her Breo Ellipta to 200/25 hopefully increased dose of ICS will help with this.  She has been instructed not to take the prior strength of the [place].  She is to let us know how this is doing for her.  She did have trauma on 5 July striking her right chest.  Will obtain chest x-ray to rule out atelectasis and rib detail to rule out fracture.  We will see her in follow-up in 2 to 3 months time she is to contact us prior to that time should any new difficulties arise.  Renold Don, MD [facility]   *This note was dictated using voice recognition software/Dragon.  Despite best efforts to proofread, errors can occur which can change the meaning.  Any change was purely unintentional.

## 2021-06-01 ENCOUNTER — Telehealth: Payer: Self-pay | Admitting: Pulmonary Disease

## 2021-06-01 NOTE — Telephone Encounter (Signed)
Tyler Pita, MD  [facility], [place] Per the radiologist there is no fracture of the ribs.  She likely has a verybad bruise on that area.   Patient is aware of results and voiced her understanding.  Nothing further needed.

## 2021-06-02 ENCOUNTER — Other Ambulatory Visit: Payer: Self-pay | Admitting: Pulmonary Disease

## 2021-06-02 ENCOUNTER — Other Ambulatory Visit: Payer: Self-pay | Admitting: Internal Medicine

## 2021-06-02 ENCOUNTER — Other Ambulatory Visit: Payer: Self-pay | Admitting: Family

## 2021-06-17 ENCOUNTER — Telehealth: Payer: Self-pay | Admitting: Pulmonary Disease

## 2021-06-17 MED ORDER — AZITHROMYCIN 250 MG PO TABS: ORAL_TABLET | ORAL | 0 refills | Status: AC

## 2021-06-17 MED ORDER — FLUTICASONE FUROATE-VILANTEROL 100-25 MCG/INH IN AEPB: 1.0000 | INHALATION_SPRAY | Freq: Every day | RESPIRATORY_TRACT | 2 refills | Status: DC

## 2021-06-17 MED ORDER — PREDNISONE 10 MG (21) PO TBPK: ORAL_TABLET | ORAL | 0 refills | Status: DC

## 2021-06-17 NOTE — Telephone Encounter (Signed)
ATC patient--unable to leave vm due to voicemail not being setup.

## 2021-06-17 NOTE — Telephone Encounter (Signed)
Spoke to patient who is requesting results of CXR-- patient was made aware of results on 05/31/2021.  She also stated that she can not tell a difference in her breathing since increasing Breo from 120mg to 2041m,  She stated cough has worsened. C/o prod with yellow sputum, chest tightness, sweats and some wheezing. Sob is baseline. Denies fever, chills or additional sx. She is taking delsym,  xyzal daily, Breo once daily and Ventolin TID. She is questioning if she should go back to Breo 100.  Dr. GoPatsey Bertholdplease advise. Thanks

## 2021-06-17 NOTE — Telephone Encounter (Signed)
Patient is aware of below message and voiced her understanding.  Rx for Breo 100 has been sent to [facility] per request.  Prednisone and zpak has been sent to local pharmacy.  Nothing further needed at this time.

## 2021-06-17 NOTE — Telephone Encounter (Signed)
We can switch her back to Breo 100.  Make sure she rinses well after use.  We can do a prednisone taper with Z-Pak.

## 2021-06-29 ENCOUNTER — Other Ambulatory Visit: Payer: Self-pay | Admitting: Family

## 2021-07-01 ENCOUNTER — Encounter: Payer: Self-pay | Admitting: Family Medicine

## 2021-07-01 ENCOUNTER — Other Ambulatory Visit: Payer: Self-pay

## 2021-07-01 ENCOUNTER — Ambulatory Visit (INDEPENDENT_AMBULATORY_CARE_PROVIDER_SITE_OTHER): Payer: Medicare Other | Admitting: Family Medicine

## 2021-07-01 VITALS — BP 130/70 | HR 86 | Temp 97.9°F | Ht 62.0 in | Wt 187.2 lb

## 2021-07-01 DIAGNOSIS — T380X5D Adverse effect of glucocorticoids and synthetic analogues, subsequent encounter: Secondary | ICD-10-CM

## 2021-07-01 DIAGNOSIS — E099 Drug or chemical induced diabetes mellitus without complications: Secondary | ICD-10-CM | POA: Diagnosis not present

## 2021-07-01 DIAGNOSIS — R0982 Postnasal drip: Secondary | ICD-10-CM | POA: Diagnosis not present

## 2021-07-01 DIAGNOSIS — J449 Chronic obstructive pulmonary disease, unspecified: Secondary | ICD-10-CM

## 2021-07-01 DIAGNOSIS — E1140 Type 2 diabetes mellitus with diabetic neuropathy, unspecified: Secondary | ICD-10-CM | POA: Diagnosis not present

## 2021-07-01 LAB — POCT GLYCOSYLATED HEMOGLOBIN (HGB A1C): Hemoglobin A1C: 8.9 % — AB (ref 4.0–5.6)

## 2021-07-01 MED ORDER — METFORMIN HCL 500 MG PO TABS: 500.0000 mg | ORAL_TABLET | Freq: Two times a day (BID) | ORAL | 3 refills | Status: DC

## 2021-07-01 NOTE — Telephone Encounter (Signed)
  Encourage patient to contact the pharmacy for refills or they can request refills through [place]:  Please schedule appointment if longer than 1 year  NEXT APPOINTMENT DATE:  MEDICATION: predniSONE (DELTASONE) 5 MG tablet  Is the patient out of medication? yes  PHARMACY: [facility]   Let patient know to contact pharmacy at the end of the day to make sure medication is ready.  Please notify patient to allow 48-72 hours to process  CLINICAL FILLS OUT ALL BELOW:   LAST REFILL:  QTY:  REFILL DATE:    OTHER COMMENTS:    Okay for refill?  Please advise

## 2021-07-01 NOTE — Progress Notes (Signed)
Patient ID: Megan Bradshaw Bradshaw, female    DOB: [birth year], 87 y.o.   MRN: [id number]  This visit was conducted in person.  BP 130/70   Pulse 86   Temp 97.9 F (36.6 C) (Temporal)   Ht '5\' 2"'  (1.575 m)   Wt 187 lb 4 oz (84.9 kg)   SpO2 97%   BMI 34.25 kg/m    CC: Chief Complaint  Patient presents with   Diabetes    Subjective:   HPI: Megan Bradshaw Bradshaw is a 87 y.o. female presenting on 07/01/2021 for Diabetes    Diabetes:  Worsened control in last 3 month.. A1C today 8.9 up from 7.6. Steroid induced DM  On glipizide 10 Xl, metformin 500 mg  ( short acting) daily.  She has not been watching her carb intake laltely Using medications without difficulties: Hypoglycemic episodes: Hyperglycemic episodes: Feet problems:neuropathy.. increase gabapentin at last OV. Blood Sugars averaging: FBS 123-153 eye exam within last year: She has been doing daily water aerobic.    She has had persistent cough, worse since COVID several months ago. COPD/asthma hx.. On Breo daily ( was not better when pulmonary increase to 2 a day), Singulair. Reviewed 05/31/21 OV from Dr. Patsey Berthold.   She has post nasal drip.. no better with flonase.      Relevant past medical, surgical, family and social history reviewed and updated as indicated. Interim medical history since our last visit reviewed. Allergies and medications reviewed and updated. Outpatient Medications Prior to Visit  Medication Sig Dispense Refill   Accu-Chek Softclix Lancets lancets as directed.     albuterol (PROVENTIL) (2.5 MG/3ML) 0.083% nebulizer solution Take 3 mLs (2.5 mg total) by nebulization every 4 (four) hours as needed for wheezing or shortness of breath. 150 mL 5   albuterol (VENTOLIN HFA) 108 (90 Base) MCG/ACT inhaler INHALE 1 TO 2 PUFFS BY MOUTH EVERY 6 HOURS FOR PAIN OR WHEEZING OR SHORTNESS OF BREATH 18 g 1   benzonatate (TESSALON) 200 MG capsule Take 1 capsule (200 mg total) by mouth 3 (three) times daily as needed for  cough. 30 capsule 1   Blood Glucose Monitoring Suppl (ACCU-CHEK GUIDE ME) w/Device KIT See admin instructions.     Cholecalciferol (VITAMIN D PO) Take 1 tablet by mouth daily.     clobetasol cream (TEMOVATE) 4.81 % Apply 1 application topically daily as needed. 30 g 1   dextromethorphan (DELSYM) 30 MG/5ML liquid Take 30 mg by mouth as needed for cough.     fluticasone furoate-vilanterol (BREO ELLIPTA) 100-25 MCG/INH AEPB Inhale 1 puff into the lungs daily. 180 each 2   gabapentin (NEURONTIN) 100 MG capsule TAKE 1 CAPSULE(100 MG) BY MOUTH THREE TIMES DAILY 90 capsule 3   glipiZIDE (GLUCOTROL XL) 10 MG 24 hr tablet TAKE 1 TABLET(10 MG) BY MOUTH DAILY WITH BREAKFAST 90 tablet 0   glucose blood (ACCU-CHEK GUIDE) test strip 1 each by Other route 2 (two) times daily. 200 each 2   ipratropium-albuterol (DUONEB) 0.5-2.5 (3) MG/3ML SOLN USE 3 ML VIA NEBULIZER EVERY 6 HOURS AS NEEDED 360 mL 1   levothyroxine (SYNTHROID) 75 MCG tablet TAKE 1 TABLET BY MOUTH EVERY DAY BEFORE BREAKFAST 90 tablet 0   metFORMIN (GLUCOPHAGE) 500 MG tablet Take 500 mg by mouth daily with breakfast.     montelukast (SINGULAIR) 10 MG tablet TAKE 1 TABLET(10 MG) BY MOUTH AT BEDTIME 30 tablet 3   Multiple Vitamin (MULTIVITAMIN) capsule Take 1 capsule by mouth daily.     nystatin (NYSTATIN)  powder APPLY EXTERNALLY TO THE AFFECTED AREA TWICE DAILY 30 g 1   omeprazole (PRILOSEC) 20 MG capsule Take 20 mg by mouth daily.     predniSONE (DELTASONE) 5 MG tablet Take 5 mg by mouth daily with breakfast.     predniSONE (STERAPRED UNI-PAK 21 TAB) 10 MG (21) TBPK tablet Use as directed. 21 each 0   No facility-administered medications prior to visit.     Per HPI unless specifically indicated in ROS section below Review of Systems  Constitutional:  Negative for fatigue and fever.  HENT:  Negative for ear pain.   Eyes:  Negative for pain.  Respiratory:  Positive for cough. Negative for chest tightness and shortness of breath.    Cardiovascular:  Negative for chest pain, palpitations and leg swelling.  Gastrointestinal:  Negative for abdominal pain.  Genitourinary:  Negative for dysuria.  Objective:  BP 130/70   Pulse 86   Temp 97.9 F (36.6 C) (Temporal)   Ht '5\' 2"'  (1.575 m)   Wt 187 lb 4 oz (84.9 kg)   SpO2 97%   BMI 34.25 kg/m   Wt Readings from Last 3 Encounters:  07/01/21 187 lb 4 oz (84.9 kg)  05/31/21 187 lb 6.4 oz (85 kg)  03/26/21 190 lb 8 oz (86.4 kg)      Physical Exam Constitutional:      General: She is not in acute distress.    Appearance: Normal appearance. She is well-developed. She is obese. She is not ill-appearing or toxic-appearing.     Comments: elderly  HENT:     Head: Normocephalic.     Right Ear: Hearing, tympanic membrane, ear canal and external ear normal. Tympanic membrane is not erythematous, retracted or bulging.     Left Ear: Hearing, tympanic membrane, ear canal and external ear normal. Tympanic membrane is not erythematous, retracted or bulging.     Nose: No mucosal edema or rhinorrhea.     Right Sinus: No maxillary sinus tenderness or frontal sinus tenderness.     Left Sinus: No maxillary sinus tenderness or frontal sinus tenderness.     Mouth/Throat:     Pharynx: Uvula midline.  Eyes:     General: Lids are normal. Lids are everted, no foreign bodies appreciated.     Conjunctiva/sclera: Conjunctivae normal.     Pupils: Pupils are equal, round, and reactive to light.  Neck:     Thyroid: No thyroid mass or thyromegaly.     Vascular: No carotid bruit.     Trachea: Trachea normal.  Cardiovascular:     Rate and Rhythm: Normal rate and regular rhythm.     Pulses: Normal pulses.     Heart sounds: Normal heart sounds, S1 normal and S2 normal. No murmur heard.   No friction rub. No gallop.  Pulmonary:     Effort: Pulmonary effort is normal. No tachypnea or respiratory distress.     Breath sounds: Normal breath sounds. No decreased breath sounds, wheezing, rhonchi or  rales.     Comments: Moist cough Abdominal:     General: Bowel sounds are normal.     Palpations: Abdomen is soft.     Tenderness: There is no abdominal tenderness.  Musculoskeletal:     Cervical back: Normal range of motion and neck supple.  Skin:    General: Skin is warm and dry.     Findings: No rash.  Neurological:     Mental Status: She is alert.  Psychiatric:  Mood and Affect: Mood is not anxious or depressed.        Speech: Speech normal.        Behavior: Behavior normal. Behavior is cooperative.        Thought Content: Thought content normal.        Judgment: Judgment normal.      Results for orders placed or performed in visit on 03/26/21  Hemoglobin A1c  Result Value Ref Range   Hgb A1c MFr Bld 8.5 (H) 4.6 - 6.5 %  Comprehensive metabolic panel  Result Value Ref Range   Sodium 136 135 - 145 mEq/L   Potassium 5.0 3.5 - 5.1 mEq/L   Chloride 101 96 - 112 mEq/L   CO2 27 19 - 32 mEq/L   Glucose, Bld 204 (H) 70 - 99 mg/dL   BUN 27 (H) 6 - 23 mg/dL   Creatinine, Ser 0.86 0.40 - 1.20 mg/dL   Total Bilirubin 0.4 0.2 - 1.2 mg/dL   Alkaline Phosphatase 69 39 - 117 U/L   AST 21 0 - 37 U/L   ALT 20 0 - 35 U/L   Total Protein 7.3 6.0 - 8.3 g/dL   Albumin 4.4 3.5 - 5.2 g/dL   GFR 60.18 >60.00 mL/min   Calcium 9.4 8.4 - 10.5 mg/dL  HM DIABETES FOOT EXAM  Result Value Ref Range   HM Diabetic Foot Exam done     This visit occurred during the SARS-CoV-2 public health emergency.  Safety protocols were in place, including screening questions prior to the visit, additional usage of staff PPE, and extensive cleaning of exam room while observing appropriate contact time as indicated for disinfecting solutions.   COVID 19 screen:  No recent travel or known exposure to COVID19 The patient denies respiratory symptoms of COVID 19 at this time. The importance of social distancing was discussed today.   Assessment and Plan Problem List Items Addressed This Visit      Neuropathy due to type 2 diabetes mellitus ([place]) - Primary    Stable control on  neurontin.      Relevant Medications   metFORMIN (GLUCOPHAGE) 500 MG tablet   Other Relevant Orders   POCT glycosylated hemoglobin (Hb A1C) (Completed)   Post-nasal drip     Retry flonase 2 sprays per nostril, mucinex and saline. Excess mucus may instead be component of COPD/asthma.      Stage 2 moderate COPD by GOLD classification ([place])    Was not improved per pt with higher dose of Breo.. will follow up with pulmonary.      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) ([place])    Watch carbohydrate intake.. limit ice cream.  Increase metformin to 2 times daily as currently she is taking 12 hour only once daily.  Continue glipizide XL 10 mg daily.      Relevant Medications   metFORMIN (GLUCOPHAGE) 500 MG tablet       Eliezer Lofts, MD

## 2021-07-01 NOTE — Assessment & Plan Note (Signed)
Was not improved per pt with higher dose of Breo.. will follow up with pulmonary.

## 2021-07-01 NOTE — Assessment & Plan Note (Signed)
Retry flonase 2 sprays per nostril, mucinex and saline. Excess mucus may instead be component of COPD/asthma.

## 2021-07-01 NOTE — Assessment & Plan Note (Signed)
Watch carbohydrate intake.. limit ice cream.  Increase metformin to 2 times daily as currently she is taking 12 hour only once daily.  Continue glipizide XL 10 mg daily.

## 2021-07-01 NOTE — Assessment & Plan Note (Signed)
Stable control on  neurontin.

## 2021-07-01 NOTE — Patient Instructions (Addendum)
Watch carbohydrate intake.. limit ice cream.  Increase metformin to 2 times daily.  Continue glipizide XL 10 mg daily.  Restart flonase.. but use 2 spray per nostril daily. Continue saline but use  before flonase.  Can use plain mucinex twice daily

## 2021-07-14 ENCOUNTER — Telehealth: Payer: Self-pay

## 2021-07-14 NOTE — Telephone Encounter (Signed)
I spoke with pt for over 20 mins. Pt said she does not have a way to go anywhere today and she is not going to pay for ambulance. Pt said she is having severe abd pain that she has never had before. Pt asked what does black stools mean. I asked pt if she had taken any pepto bismol or any iron supplements. Pt said no.pt did eat some blueberries but could not remember what day she had them pt said was probably 5 days ago that saw the black stools. Pt said she does not know what to do. I explained if no available appts at [facility] today pt could go to ED for eval and possible testing because the abd pain could be lots of different things. Pt said she would wait until tomorrow and see if she can get appt at [facility] and find some one who will provide transportation without calling EMS.  UC and ED precautions given and pt voiced understanding. Sending note to Dr Diona Browner and Butch Penny CMA. Sending a teams to [place].

## 2021-07-14 NOTE — Telephone Encounter (Signed)
[facility] - Client TELEPHONE ADVICE RECORD [facility] Patient Name: Megan Bradshaw Gender: Female DOB: 09-11-1930 Age: 87 Y 14 D Return Phone Number: [phone] (Secondary) Address: City/ State/ Zip: [place] [address] Client [facility] - Client Client Site [facility] Day Physician Eliezer Lofts - MD Contact Type Call Who Is Calling Patient / Member / Family / Caregiver Call Type Triage / Clinical Relationship To Patient Self Return Phone Number [phone] (Secondary) Chief Complaint SEVERE ABDOMINAL PAIN - Severe pain in abdomen Reason for Call Symptomatic / Request for [name] states she is having severe lower abdominal pain, has been going on for a couple of days. She is having diarrhea. Translation No Nurse Assessment Nurse: Nicki Reaper, RN, Malachy Mood Date/Time (Eastern Time): 07/14/2021 2:04:56 PM Confirm and document reason for call. If symptomatic, describe symptoms. ---Caller states she has severe lower abdomen pain across abdomen that started last night that is constant, rates pain 4-5/10 , has taken Ibuprofen yesterday, she was constipated and took 2 stool softeners last night, has little hard rocks of stool, last good BM was a couple days ago, drinking fluids and voiding, denies other symptoms, no known exp to covid in last 14 days, Does the patient have any new or worsening symptoms? ---Yes Will a triage be completed? ---Yes Related visit to physician within the last 2 weeks? ---No Does the PT have any chronic conditions? (i.e. diabetes, asthma, this includes High risk factors for pregnancy, etc.) ---Yes List chronic conditions. ---DM Is this a behavioral health or substance abuse call? ---No Guidelines Guideline Title Affirmed Question Affirmed Notes Nurse Date/Time Eilene Ghazi Time) Abdominal Pain - Female [1] MILDMODERATE pain AND  [2] constant Nicki Reaper, RN, Malachy Mood 07/14/2021 2:08:44 PM PLEASE NOTE: All timestamps contained within this report are represented as [place] Standard Time. CONFIDENTIALTY NOTICE: This fax transmission is intended only for the addressee. It contains information that is legally privileged, confidential or otherwise protected from use or disclosure. If you are not the intended recipient, you are strictly prohibited from reviewing, disclosing, copying using or disseminating any of this information or taking any action in reliance on or regarding this information. If you have received this fax in error, please notify us immediately by telephone so that we can arrange for its return to [place]. Phone: [phone], Toll-Free: [phone], Fax: [phone] Page: 2 of 2 Call Id: [id number] Guidelines Guideline Title Affirmed Question Affirmed Notes Nurse Date/Time Eilene Ghazi Time) AND [3] present > 2 hours Disp. Time Eilene Ghazi Time) Disposition Final User 07/14/2021 2:02:47 PM Send to Urgent Jeannette Corpus, Tiffany 07/14/2021 2:19:40 PM See HCP within 4 Hours (or PCP triage) Yes Nicki Reaper, RN, Erskine Speed Disagree/Comply Disagree Caller Understands Yes PreDisposition Call Doctor Care Advice Given Per Guideline SEE HCP (OR PCP TRIAGE) WITHIN 4 HOURS: * IF OFFICE WILL BE OPEN: You need to be seen within the next 3 or 4 hours. Call your doctor (or NP/PA) now or as soon as the office opens. Comments User: Burna Sis, RN Date/Time Eilene Ghazi Time): 07/14/2021 2:21:01 PM Per office no appts send to UC, pt is refusing UC and ER Referrals GO TO FACILITY REFUSED

## 2021-07-14 NOTE — Telephone Encounter (Signed)
Access Nurse called back wanting to schedule an appt within the next 3 hours No appts available. Access Nurse was to tell pt to go to UC or the ED

## 2021-07-15 NOTE — Telephone Encounter (Addendum)
I called pt x 2 with no answer and v/m is not set up. Pt called back.pt notified as instructed by Dr Diona Browner and pt voiced understanding. pt said she had a small BM this morning. No blood or black stools seen.pt said the abd pain is slightly better. Pt said sometimes the pain moves but right now the pain is lower mid abd below the belly button. Pt scheduled appt with Dr Diona Browner in office on 07/16/21 at 10:00 AM. UC & ED precautions given and pt voiced understanding.sending note back to Dr Diona Browner.

## 2021-07-15 NOTE — Telephone Encounter (Signed)
Rena,  Can you please follow up with Ms. Swicegood.  See Dr. Rometta Emery note below.

## 2021-07-15 NOTE — Telephone Encounter (Signed)
Make sure she is holding any NSAIDs, ASA, blood thinners ( none on her list)  Have her increase omeprazole to 40 mg daily .  Work her in later today or tomorrow depending on her transportation.

## 2021-07-16 ENCOUNTER — Other Ambulatory Visit: Payer: Self-pay

## 2021-07-16 ENCOUNTER — Ambulatory Visit (INDEPENDENT_AMBULATORY_CARE_PROVIDER_SITE_OTHER): Payer: Medicare Other | Admitting: Family Medicine

## 2021-07-16 ENCOUNTER — Telehealth: Payer: Self-pay | Admitting: Family Medicine

## 2021-07-16 ENCOUNTER — Encounter: Payer: Self-pay | Admitting: Family Medicine

## 2021-07-16 ENCOUNTER — Ambulatory Visit
Admission: RE | Admit: 2021-07-16 | Discharge: 2021-07-16 | Disposition: A | Discharge status: Home / Self Care | Payer: Medicare Other | Source: Ambulatory Visit | Attending: Family Medicine | Admitting: Family Medicine

## 2021-07-16 VITALS — BP 132/92 | HR 84 | Temp 98.1°F | Ht 62.0 in | Wt 182.5 lb

## 2021-07-16 DIAGNOSIS — K760 Fatty (change of) liver, not elsewhere classified: Secondary | ICD-10-CM | POA: Diagnosis not present

## 2021-07-16 DIAGNOSIS — R1030 Lower abdominal pain, unspecified: Secondary | ICD-10-CM | POA: Diagnosis not present

## 2021-07-16 LAB — COMPREHENSIVE METABOLIC PANEL
ALT: 18 U/L (ref 0–35)
AST: 19 U/L (ref 0–37)
Albumin: 4.1 g/dL (ref 3.5–5.2)
Alkaline Phosphatase: 71 U/L (ref 39–117)
BUN: 21 mg/dL (ref 6–23)
CO2: 27 mEq/L (ref 19–32)
Calcium: 9.8 mg/dL (ref 8.4–10.5)
Chloride: 101 mEq/L (ref 96–112)
Creatinine, Ser: 0.86 mg/dL (ref 0.40–1.20)
GFR: 60.05 mL/min (ref >60.00)
Glucose, Bld: 161 mg/dL — ABNORMAL HIGH (ref 70–99)
Potassium: 4.5 mEq/L (ref 3.5–5.1)
Sodium: 137 mEq/L (ref 135–145)
Total Bilirubin: 0.4 mg/dL (ref 0.2–1.2)
Total Protein: 7.4 g/dL (ref 6.0–8.3)

## 2021-07-16 LAB — POC URINALSYSI DIPSTICK (AUTOMATED)
Bilirubin, UA: NEGATIVE
Glucose, UA: NEGATIVE
Nitrite, UA: POSITIVE
Protein, UA: POSITIVE — AB
Spec Grav, UA: 1.025 (ref 1.010–1.025)
Urobilinogen, UA: 0.2 E.U./dL
pH, UA: 5.5 (ref 5.0–8.0)

## 2021-07-16 LAB — CBC WITH DIFFERENTIAL/PLATELET
Basophils Absolute: 0.1 10*3/uL (ref 0.0–0.1)
Basophils Relative: 1.2 % (ref 0.0–3.0)
Eosinophils Absolute: 0.2 10*3/uL (ref 0.0–0.7)
Eosinophils Relative: 2.2 % (ref 0.0–5.0)
HCT: 41.2 % (ref 36.0–46.0)
Hemoglobin: 13.8 g/dL (ref 12.0–15.0)
Lymphocytes Relative: 21 % (ref 12.0–46.0)
Lymphs Abs: 2 10*3/uL (ref 0.7–4.0)
MCHC: 33.6 g/dL (ref 30.0–36.0)
MCV: 90.2 fl (ref 78.0–100.0)
Monocytes Absolute: 1 10*3/uL (ref 0.1–1.0)
Monocytes Relative: 10.4 % (ref 3.0–12.0)
Neutro Abs: 6.1 10*3/uL (ref 1.4–7.7)
Neutrophils Relative %: 65.2 % (ref 43.0–77.0)
Platelets: 345 10*3/uL (ref 150.0–400.0)
RBC: 4.56 Mil/uL (ref 3.87–5.11)
RDW: 14 % (ref 11.5–15.5)
WBC: 9.4 10*3/uL (ref 4.0–10.5)

## 2021-07-16 MED ORDER — IOHEXOL 300 MG/ML  SOLN
85.0000 mL | Freq: Once | INTRAMUSCULAR | Status: AC | PRN
  Administered 2021-07-16: 85 mL via INTRAVENOUS

## 2021-07-16 MED ORDER — AMOXICILLIN-POT CLAVULANATE 875-125 MG PO TABS: 1.0000 | ORAL_TABLET | Freq: Two times a day (BID) | ORAL | 0 refills | Status: DC

## 2021-07-16 NOTE — Telephone Encounter (Signed)
Ms. Megan Bradshaw notified by telephone that Augmentin has been sent to [facility] on [address].

## 2021-07-16 NOTE — Progress Notes (Signed)
Patient ID: Megan Bradshaw, female    DOB: 19-Aug-1930, 87 y.o.   MRN: [id number]  This visit was conducted in person.  BP (!) 132/92   Pulse 84   Temp 98.1 F (36.7 C) (Temporal)   Ht '5\' 2"'  (1.575 m)   Wt 182 lb 8 oz (82.8 kg)   SpO2 100%   BMI 33.38 kg/m    CC: Chief Complaint  Patient presents with   Abdominal Pain    Subjective:   HPI: Megan Bradshaw is a 87 y.o. female presenting on 07/16/2021 for Abdominal Pain  She reports new onset pain in lower abdomen.. mainly in the center... pain severe Pain increased  with moving.  Pain ongoing in last 5 days, worse in last 3 days.  Decreased appetite. After eating.. felt bloating. She has been having increased BM, small hard stool... multiple times. Straining to have BM No diarrhea.  No blood in stool. Mild nausea, no vomiting.  No fever.  No burning with urination.    CBG  124 fasting this AM.   Hx of diverticulitis in past.. At last OV increased metformin to BID given she was only taking 500 mg regular once daily.      Relevant past medical, surgical, family and social history reviewed and updated as indicated. Interim medical history since our last visit reviewed. Allergies and medications reviewed and updated. Outpatient Medications Prior to Visit  Medication Sig Dispense Refill   Accu-Chek Softclix Lancets lancets as directed.     albuterol (PROVENTIL) (2.5 MG/3ML) 0.083% nebulizer solution Take 3 mLs (2.5 mg total) by nebulization every 4 (four) hours as needed for wheezing or shortness of breath. 150 mL 5   albuterol (VENTOLIN HFA) 108 (90 Base) MCG/ACT inhaler INHALE 1 TO 2 PUFFS BY MOUTH EVERY 6 HOURS FOR PAIN OR WHEEZING OR SHORTNESS OF BREATH 18 g 1   benzonatate (TESSALON) 200 MG capsule Take 1 capsule (200 mg total) by mouth 3 (three) times daily as needed for cough. 30 capsule 1   Blood Glucose Monitoring Suppl (ACCU-CHEK GUIDE ME) w/Device KIT See admin instructions.     Cholecalciferol (VITAMIN D  PO) Take 1 tablet by mouth daily.     clobetasol cream (TEMOVATE) 3.76 % Apply 1 application topically daily as needed. 30 g 1   dextromethorphan (DELSYM) 30 MG/5ML liquid Take 30 mg by mouth as needed for cough.     fluticasone furoate-vilanterol (BREO ELLIPTA) 100-25 MCG/INH AEPB Inhale 1 puff into the lungs daily. 180 each 2   gabapentin (NEURONTIN) 100 MG capsule TAKE 1 CAPSULE(100 MG) BY MOUTH THREE TIMES DAILY 90 capsule 3   glipiZIDE (GLUCOTROL XL) 10 MG 24 hr tablet TAKE 1 TABLET(10 MG) BY MOUTH DAILY WITH BREAKFAST 90 tablet 0   glucose blood (ACCU-CHEK GUIDE) test strip 1 each by Other route 2 (two) times daily. 200 each 2   ipratropium-albuterol (DUONEB) 0.5-2.5 (3) MG/3ML SOLN USE 3 ML VIA NEBULIZER EVERY 6 HOURS AS NEEDED 360 mL 1   levothyroxine (SYNTHROID) 75 MCG tablet TAKE 1 TABLET BY MOUTH EVERY DAY BEFORE BREAKFAST 90 tablet 0   metFORMIN (GLUCOPHAGE) 500 MG tablet Take 1 tablet (500 mg total) by mouth 2 (two) times daily with a meal. 180 tablet 3   montelukast (SINGULAIR) 10 MG tablet TAKE 1 TABLET(10 MG) BY MOUTH AT BEDTIME 30 tablet 3   Multiple Vitamin (MULTIVITAMIN) capsule Take 1 capsule by mouth daily.     nystatin (NYSTATIN) powder APPLY EXTERNALLY TO THE AFFECTED  AREA TWICE DAILY 30 g 1   omeprazole (PRILOSEC) 20 MG capsule Take 20 mg by mouth daily.     predniSONE (DELTASONE) 5 MG tablet TAKE 1 TABLET(5 MG) BY MOUTH DAILY WITH BREAKFAST 90 tablet 0   No facility-administered medications prior to visit.     Per HPI unless specifically indicated in ROS section below Review of Systems  Constitutional:  Negative for fatigue and fever.  HENT:  Negative for congestion.   Eyes:  Negative for pain.  Respiratory:  Positive for cough. Negative for shortness of breath.   Cardiovascular:  Negative for chest pain, palpitations and leg swelling.  Gastrointestinal:  Positive for abdominal pain. Negative for blood in stool, constipation, diarrhea and nausea.  Genitourinary:   Negative for dysuria and vaginal bleeding.  Musculoskeletal:  Negative for back pain.  Neurological:  Negative for syncope, light-headedness and headaches.  Psychiatric/Behavioral:  Negative for dysphoric mood.   Objective:  BP (!) 132/92   Pulse 84   Temp 98.1 F (36.7 C) (Temporal)   Ht '5\' 2"'  (1.575 m)   Wt 182 lb 8 oz (82.8 kg)   SpO2 100%   BMI 33.38 kg/m   Wt Readings from Last 3 Encounters:  07/16/21 182 lb 8 oz (82.8 kg)  07/01/21 187 lb 4 oz (84.9 kg)  05/31/21 187 lb 6.4 oz (85 kg)      Physical Exam Constitutional:      General: She is not in acute distress.    Appearance: Normal appearance. She is well-developed. She is not ill-appearing or toxic-appearing.  HENT:     Head: Normocephalic.     Right Ear: Hearing, tympanic membrane, ear canal and external ear normal. Tympanic membrane is not erythematous, retracted or bulging.     Left Ear: Hearing, tympanic membrane, ear canal and external ear normal. Tympanic membrane is not erythematous, retracted or bulging.     Nose: No mucosal edema or rhinorrhea.     Right Sinus: No maxillary sinus tenderness or frontal sinus tenderness.     Left Sinus: No maxillary sinus tenderness or frontal sinus tenderness.     Mouth/Throat:     Pharynx: Uvula midline.  Eyes:     General: Lids are normal. Lids are everted, no foreign bodies appreciated.     Conjunctiva/sclera: Conjunctivae normal.     Pupils: Pupils are equal, round, and reactive to light.  Neck:     Thyroid: No thyroid mass or thyromegaly.     Vascular: No carotid bruit.     Trachea: Trachea normal.  Cardiovascular:     Rate and Rhythm: Normal rate and regular rhythm.     Pulses: Normal pulses.     Heart sounds: Normal heart sounds, S1 normal and S2 normal. No murmur heard.   No friction rub. No gallop.  Pulmonary:     Effort: Pulmonary effort is normal. No tachypnea or respiratory distress.     Breath sounds: Normal breath sounds. No decreased breath sounds,  wheezing, rhonchi or rales.  Abdominal:     General: Bowel sounds are normal.     Palpations: Abdomen is soft.     Tenderness: There is abdominal tenderness in the suprapubic area and left lower quadrant. There is rebound. There is no right CVA tenderness, left CVA tenderness or guarding.  Musculoskeletal:     Cervical back: Normal range of motion and neck supple.  Skin:    General: Skin is warm and dry.     Findings: No rash.  Neurological:  Mental Status: She is alert.  Psychiatric:        Mood and Affect: Mood is not anxious or depressed.        Speech: Speech normal.        Behavior: Behavior normal. Behavior is cooperative.        Thought Content: Thought content normal.        Judgment: Judgment normal.      Results for orders placed or performed in visit on 07/01/21  POCT glycosylated hemoglobin (Hb A1C)  Result Value Ref Range   Hemoglobin A1C 8.9 (A) 4.0 - 5.6 %   HbA1c POC (<> result, manual entry)     HbA1c, POC (prediabetic range)     HbA1c, POC (controlled diabetic range)      This visit occurred during the SARS-CoV-2 public health emergency.  Safety protocols were in place, including screening questions prior to the visit, additional usage of staff PPE, and extensive cleaning of exam room while observing appropriate contact time as indicated for disinfecting solutions.   COVID 19 screen:  No recent travel or known exposure to COVID19 The patient denies respiratory symptoms of COVID 19 at this time. The importance of social distancing was discussed today.   Assessment and Plan Problem List Items Addressed This Visit     Lower abdominal pain - Primary    Acute, poor control, worsening  Concerning for acute diverticulitis vs bladder issue vs renal stone vs constipation vs med SE. Not better on previous dose of metfomrin.  Eval with UA for UTi/ kidney stone  CBC and CMET for infection, liver, kidney issue   Send for CT abd to eval for acute  diverticulitis.   All stat.      Relevant Orders   CT Abdomen Pelvis W Contrast (Completed)   CBC with Differential/Platelet (Completed)   Comprehensive metabolic panel (Completed)   POCT Urinalysis Dipstick (Automated) (Completed)   Urine Culture (Completed)   High level of acuity and high level of decision making.     Eliezer Lofts, MD

## 2021-07-16 NOTE — Assessment & Plan Note (Signed)
Acute, poor control, worsening  Concerning for acute diverticulitis vs bladder issue vs renal stone vs constipation vs med SE. Not better on previous dose of metfomrin.  Eval with UA for UTi/ kidney stone  CBC and CMET for infection, liver, kidney issue   Send for CT abd to eval for acute diverticulitis.   All stat.

## 2021-07-16 NOTE — Telephone Encounter (Signed)
Augmentin prescription sent to [facility] on [address].  Tried to call Megan Bradshaw back and her voicemail is not set up so I was unable to leave her a message.

## 2021-07-16 NOTE — Telephone Encounter (Signed)
Mrs. Tregoning called in and wanted to make sure the Augmentin due to she stated that it was suppose to be called in was sent in [place] [place]

## 2021-07-18 LAB — URINE CULTURE
MICRO NUMBER:: 12327763
SPECIMEN QUALITY:: ADEQUATE

## 2021-07-20 ENCOUNTER — Other Ambulatory Visit: Payer: Self-pay

## 2021-07-20 ENCOUNTER — Encounter: Payer: Self-pay | Admitting: Family Medicine

## 2021-07-20 ENCOUNTER — Ambulatory Visit (INDEPENDENT_AMBULATORY_CARE_PROVIDER_SITE_OTHER): Payer: Medicare Other | Admitting: Family Medicine

## 2021-07-20 VITALS — BP 128/74 | HR 86 | Temp 97.7°F | Ht 62.0 in | Wt 184.0 lb

## 2021-07-20 DIAGNOSIS — B9620 Unspecified Escherichia coli [E. coli] as the cause of diseases classified elsewhere: Secondary | ICD-10-CM | POA: Diagnosis not present

## 2021-07-20 DIAGNOSIS — E099 Drug or chemical induced diabetes mellitus without complications: Secondary | ICD-10-CM | POA: Diagnosis not present

## 2021-07-20 DIAGNOSIS — K5792 Diverticulitis of intestine, part unspecified, without perforation or abscess without bleeding: Secondary | ICD-10-CM | POA: Insufficient documentation

## 2021-07-20 DIAGNOSIS — T380X5D Adverse effect of glucocorticoids and synthetic analogues, subsequent encounter: Secondary | ICD-10-CM | POA: Diagnosis not present

## 2021-07-20 DIAGNOSIS — N390 Urinary tract infection, site not specified: Secondary | ICD-10-CM | POA: Diagnosis not present

## 2021-07-20 MED ORDER — SULFAMETHOXAZOLE-TRIMETHOPRIM 800-160 MG PO TABS: 1.0000 | ORAL_TABLET | Freq: Two times a day (BID) | ORAL | 0 refills | Status: DC

## 2021-07-20 NOTE — Patient Instructions (Addendum)
Start now probiotic.Marland Kitchen example Align or Culturelle or generic lactobacilli.  Continue Augmentin.  If you start having  urinary symptoms... start Bactrim DS twice daily  x 3 days  for possible urinary tract infection.   Follow blood sugars daily, call if > 150.

## 2021-07-20 NOTE — Assessment & Plan Note (Addendum)
Improving  With Augmentin.  Good po intake.  Complete course. ER and return precautions given.   Start probiotics to prevent GI SE.

## 2021-07-20 NOTE — Assessment & Plan Note (Signed)
Continue following closely while ongoing infection.

## 2021-07-20 NOTE — Assessment & Plan Note (Signed)
No urinary symptoms.. most likely contamination.  If urinary symptoms start she will compete bactrim DS x 3 days.

## 2021-07-20 NOTE — Progress Notes (Signed)
Patient ID: Megan Bradshaw, female    DOB: 10/28/1930, 87 y.o.   MRN: [id number]  This visit was conducted in person.  BP 128/74   Pulse 86   Temp 97.7 F (36.5 C) (Temporal)   Ht _0  (1.575 m)   Wt 184 lb (83.5 kg)   SpO2 97%   BMI 33.65 kg/m    CC: Chief Complaint  Patient presents with   Follow-up    Subjective:   HPI: Megan Bradshaw is a 87 y.o. female presenting on 07/20/2021 for Follow-up   Acute diverticulitis on day 4-5/10 Pain is improving but she is still feeling bloating. No abd pain. No fever.  She is tolerating the antibiotics. No N/V   Mild diarrhea yesterday, none today.   She is keeping up with fluids, go po intake.  No fever.    Urine culture returned showing  ECOLI resistant to  Augmentin. She denies burning with urination, she has a slightly decreased flow for 5 years.     Diabetes.Marland Kitchen FBS 115 this AM.. very well controlled lately fasting, not checking post prandials.  Relevant past medical, surgical, family and social history reviewed and updated as indicated. Interim medical history since our last visit reviewed. Allergies and medications reviewed and updated. Outpatient Medications Prior to Visit  Medication Sig Dispense Refill   Accu-Chek Softclix Lancets lancets as directed.     albuterol (PROVENTIL) (2.5 MG/3ML) 0.083% nebulizer solution Take 3 mLs (2.5 mg total) by nebulization every 4 (four) hours as needed for wheezing or shortness of breath. 150 mL 5   albuterol (VENTOLIN HFA) 108 (90 Base) MCG/ACT inhaler INHALE 1 TO 2 PUFFS BY MOUTH EVERY 6 HOURS FOR PAIN OR WHEEZING OR SHORTNESS OF BREATH 18 g 1   amoxicillin-clavulanate (AUGMENTIN) 875-125 MG tablet Take 1 tablet by mouth 2 (two) times daily. 20 tablet 0   benzonatate (TESSALON) 200 MG capsule Take 1 capsule (200 mg total) by mouth 3 (three) times daily as needed for cough. 30 capsule 1   Blood Glucose Monitoring Suppl (ACCU-CHEK GUIDE ME) w/Device KIT See admin instructions.      Cholecalciferol (VITAMIN D PO) Take 1 tablet by mouth daily.     clobetasol cream (TEMOVATE) 2.23 % Apply 1 application topically daily as needed. 30 g 1   dextromethorphan (DELSYM) 30 MG/5ML liquid Take 30 mg by mouth as needed for cough.     fluticasone furoate-vilanterol (BREO ELLIPTA) 100-25 MCG/INH AEPB Inhale 1 puff into the lungs daily. 180 each 2   gabapentin (NEURONTIN) 100 MG capsule TAKE 1 CAPSULE(100 MG) BY MOUTH THREE TIMES DAILY 90 capsule 3   glipiZIDE (GLUCOTROL XL) 10 MG 24 hr tablet TAKE 1 TABLET(10 MG) BY MOUTH DAILY WITH BREAKFAST 90 tablet 0   glucose blood (ACCU-CHEK GUIDE) test strip 1 each by Other route 2 (two) times daily. 200 each 2   ipratropium-albuterol (DUONEB) 0.5-2.5 (3) MG/3ML SOLN USE 3 ML VIA NEBULIZER EVERY 6 HOURS AS NEEDED 360 mL 1   levothyroxine (SYNTHROID) 75 MCG tablet TAKE 1 TABLET BY MOUTH EVERY DAY BEFORE BREAKFAST 90 tablet 0   metFORMIN (GLUCOPHAGE) 500 MG tablet Take 1 tablet (500 mg total) by mouth 2 (two) times daily with a meal. 180 tablet 3   montelukast (SINGULAIR) 10 MG tablet TAKE 1 TABLET(10 MG) BY MOUTH AT BEDTIME 30 tablet 3   Multiple Vitamin (MULTIVITAMIN) capsule Take 1 capsule by mouth daily.     nystatin (NYSTATIN) powder APPLY EXTERNALLY TO THE AFFECTED AREA  TWICE DAILY 30 g 1   omeprazole (PRILOSEC) 20 MG capsule Take 20 mg by mouth daily.     predniSONE (DELTASONE) 5 MG tablet TAKE 1 TABLET(5 MG) BY MOUTH DAILY WITH BREAKFAST 90 tablet 0   No facility-administered medications prior to visit.     Per HPI unless specifically indicated in ROS section below Review of Systems  Constitutional:  Negative for fatigue and fever.  HENT:  Negative for congestion.   Eyes:  Negative for pain.  Respiratory:  Negative for cough and shortness of breath.   Cardiovascular:  Negative for chest pain, palpitations and leg swelling.  Gastrointestinal:  Negative for abdominal pain.  Genitourinary:  Negative for dysuria and vaginal bleeding.   Musculoskeletal:  Negative for back pain.  Neurological:  Negative for syncope, light-headedness and headaches.  Psychiatric/Behavioral:  Negative for dysphoric mood.   Objective:  BP 128/74   Pulse 86   Temp 97.7 F (36.5 C) (Temporal)   Ht _0  (1.575 m)   Wt 184 lb (83.5 kg)   SpO2 97%   BMI 33.65 kg/m   Wt Readings from Last 3 Encounters:  07/20/21 184 lb (83.5 kg)  07/16/21 182 lb 8 oz (82.8 kg)  07/01/21 187 lb 4 oz (84.9 kg)      Physical Exam Constitutional:      General: She is not in acute distress.    Appearance: Normal appearance. She is well-developed. She is not ill-appearing or toxic-appearing.  HENT:     Head: Normocephalic.     Right Ear: Hearing, tympanic membrane, ear canal and external ear normal. Tympanic membrane is not erythematous, retracted or bulging.     Left Ear: Hearing, tympanic membrane, ear canal and external ear normal. Tympanic membrane is not erythematous, retracted or bulging.     Nose: No mucosal edema or rhinorrhea.     Right Sinus: No maxillary sinus tenderness or frontal sinus tenderness.     Left Sinus: No maxillary sinus tenderness or frontal sinus tenderness.     Mouth/Throat:     Pharynx: Uvula midline.  Eyes:     General: Lids are normal. Lids are everted, no foreign bodies appreciated.     Conjunctiva/sclera: Conjunctivae normal.     Pupils: Pupils are equal, round, and reactive to light.  Neck:     Thyroid: No thyroid mass or thyromegaly.     Vascular: No carotid bruit.     Trachea: Trachea normal.  Cardiovascular:     Rate and Rhythm: Normal rate and regular rhythm.     Pulses: Normal pulses.     Heart sounds: Normal heart sounds, S1 normal and S2 normal. No murmur heard.   No friction rub. No gallop.  Pulmonary:     Effort: Pulmonary effort is normal. No tachypnea or respiratory distress.     Breath sounds: Normal breath sounds. No decreased breath sounds, wheezing, rhonchi or rales.  Abdominal:     General: Bowel  sounds are normal.     Palpations: Abdomen is soft.     Tenderness: There is no abdominal tenderness.  Musculoskeletal:     Cervical back: Normal range of motion and neck supple.  Skin:    General: Skin is warm and dry.     Findings: No rash.  Neurological:     Mental Status: She is alert.  Psychiatric:        Mood and Affect: Mood is not anxious or depressed.        Speech: Speech normal.  Behavior: Behavior normal. Behavior is cooperative.        Thought Content: Thought content normal.        Judgment: Judgment normal.      Results for orders placed or performed in visit on 07/16/21  Urine Culture   Specimen: Urine  Result Value Ref Range   MICRO NUMBER: [id number]    SPECIMEN QUALITY: Adequate    Sample Source NOT GIVEN    STATUS: FINAL    ISOLATE 1: Escherichia coli (A)       Susceptibility   Escherichia coli - URINE CULTURE, REFLEX    AMOX/CLAVULANIC >=32 Resistant     AMPICILLIN >=32 Resistant     AMPICILLIN/SULBACTAM 16 Intermediate     CEFAZOLIN* 8 Resistant      * For uncomplicated UTI caused by E. coli, K. pneumoniae or P. mirabilis: Cefazolin is susceptible if MIC <32 mcg/mL and predicts susceptible to the oral agents cefaclor, cefdinir, cefpodoxime, cefprozil, cefuroxime, cephalexin and loracarbef.     CEFTAZIDIME <=1 Sensitive     CEFEPIME <=1 Sensitive     CEFTRIAXONE <=1 Sensitive     CIPROFLOXACIN <=0.25 Sensitive     LEVOFLOXACIN <=0.12 Sensitive     GENTAMICIN <=1 Sensitive     IMIPENEM <=0.25 Sensitive     NITROFURANTOIN <=16 Sensitive     PIP/TAZO 8 Sensitive     TOBRAMYCIN <=1 Sensitive     TRIMETH/SULFA* <=20 Sensitive      * For uncomplicated UTI caused by E. coli, K. pneumoniae or P. mirabilis: Cefazolin is susceptible if MIC <32 mcg/mL and predicts susceptible to the oral agents cefaclor, cefdinir, cefpodoxime, cefprozil, cefuroxime, cephalexin and loracarbef. Legend: S = Susceptible  I = Intermediate R = Resistant  NS = Not  susceptible * = Not tested  NR = Not reported **NN = See antimicrobic comments   CBC with Differential/Platelet  Result Value Ref Range   WBC 9.4 4.0 - 10.5 K/uL   RBC 4.56 3.87 - 5.11 Mil/uL   Hemoglobin 13.8 12.0 - 15.0 g/dL   HCT 41.2 36.0 - 46.0 %   MCV 90.2 78.0 - 100.0 fl   MCHC 33.6 30.0 - 36.0 g/dL   RDW 14.0 11.5 - 15.5 %   Platelets 345.0 150.0 - 400.0 K/uL   Neutrophils Relative % 65.2 43.0 - 77.0 %   Lymphocytes Relative 21.0 12.0 - 46.0 %   Monocytes Relative 10.4 3.0 - 12.0 %   Eosinophils Relative 2.2 0.0 - 5.0 %   Basophils Relative 1.2 0.0 - 3.0 %   Neutro Abs 6.1 1.4 - 7.7 K/uL   Lymphs Abs 2.0 0.7 - 4.0 K/uL   Monocytes Absolute 1.0 0.1 - 1.0 K/uL   Eosinophils Absolute 0.2 0.0 - 0.7 K/uL   Basophils Absolute 0.1 0.0 - 0.1 K/uL  Comprehensive metabolic panel  Result Value Ref Range   Sodium 137 135 - 145 mEq/L   Potassium 4.5 3.5 - 5.1 mEq/L   Chloride 101 96 - 112 mEq/L   CO2 27 19 - 32 mEq/L   Glucose, Bld 161 (H) 70 - 99 mg/dL   BUN 21 6 - 23 mg/dL   Creatinine, Ser 0.86 0.40 - 1.20 mg/dL   Total Bilirubin 0.4 0.2 - 1.2 mg/dL   Alkaline Phosphatase 71 39 - 117 U/L   AST 19 0 - 37 U/L   ALT 18 0 - 35 U/L   Total Protein 7.4 6.0 - 8.3 g/dL   Albumin 4.1 3.5 - 5.2 g/dL  GFR 60.05 >60.00 mL/min   Calcium 9.8 8.4 - 10.5 mg/dL  POCT Urinalysis Dipstick (Automated)  Result Value Ref Range   Color, UA Yellow    Clarity, UA Hazy    Glucose, UA Negative Negative   Bilirubin, UA Negative    Ketones, UA Trace    Spec Grav, UA 1.025 1.010 - 1.025   Blood, UA Small (1+)    pH, UA 5.5 5.0 - 8.0   Protein, UA Positive (A) Negative   Urobilinogen, UA 0.2 0.2 or 1.0 E.U./dL   Nitrite, UA Positive    Leukocytes, UA Large (3+) (A) Negative    This visit occurred during the SARS-CoV-2 public health emergency.  Safety protocols were in place, including screening questions prior to the visit, additional usage of staff PPE, and extensive cleaning of exam room  while observing appropriate contact time as indicated for disinfecting solutions.   COVID 19 screen:  No recent travel or known exposure to COVID19 The patient denies respiratory symptoms of COVID 19 at this time. The importance of social distancing was discussed today.   Assessment and Plan    Problem List Items Addressed This Visit     Acute diverticulitis - Primary    Improving  With Augmentin.  Good po intake.  Complete course. ER and return precautions given.   Start probiotics to prevent GI SE.      E-coli UTI    No urinary symptoms.. most likely contamination.  If urinary symptoms start she will compete bactrim DS x 3 days.       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        Eliezer Lofts, MD

## 2021-08-02 ENCOUNTER — Encounter: Payer: Self-pay | Admitting: Pulmonary Disease

## 2021-08-02 ENCOUNTER — Ambulatory Visit: Payer: Medicare Other | Admitting: Pulmonary Disease

## 2021-08-02 ENCOUNTER — Ambulatory Visit (INDEPENDENT_AMBULATORY_CARE_PROVIDER_SITE_OTHER): Payer: Medicare Other | Admitting: Pulmonary Disease

## 2021-08-02 ENCOUNTER — Other Ambulatory Visit: Payer: Self-pay

## 2021-08-02 VITALS — BP 132/76 | HR 82 | Temp 97.9°F | Ht 62.0 in | Wt 187.4 lb

## 2021-08-02 DIAGNOSIS — R0602 Shortness of breath: Secondary | ICD-10-CM | POA: Diagnosis not present

## 2021-08-02 DIAGNOSIS — Z8616 Personal history of COVID-19: Secondary | ICD-10-CM | POA: Diagnosis not present

## 2021-08-02 DIAGNOSIS — E669 Obesity, unspecified: Secondary | ICD-10-CM

## 2021-08-02 DIAGNOSIS — J449 Chronic obstructive pulmonary disease, unspecified: Secondary | ICD-10-CM

## 2021-08-02 DIAGNOSIS — R053 Chronic cough: Secondary | ICD-10-CM | POA: Diagnosis not present

## 2021-08-02 DIAGNOSIS — Z23 Encounter for immunization: Secondary | ICD-10-CM

## 2021-08-02 MED ORDER — AZITHROMYCIN 250 MG PO TABS: ORAL_TABLET | ORAL | 1 refills | Status: DC

## 2021-08-02 NOTE — Progress Notes (Signed)
Subjective:    Patient ID: Megan Bradshaw, female    DOB: 1930-08-13, 87 y.o.   MRN: [id number] Chief Complaint  Patient presents with   Follow-up    Sob with exertion and prod cough with clear to yellow sputum.    Pt profile: 87 y.o. F, former smoker, moved from [place]. Initially seen by Dr Melvyn Novas. Diagnosed with COPD in 2011. Graded as Gold I by Dr.  Melvyn Novas. Chronic prednisone therapy for non-pulmonary diagnosis.  Last PFTs 02/16/2016.  Followed by Dr. Alva Garnet since October 2016. Established with me in November 2020 after Dr. Alva Garnet' departure from the practice.   PROBLEMS: Gold II COPD with chronic asthmatic bronchitis Chronic refractory cough Class III dyspnea out of proportion to PFT parameters   DATA: 10/22/14 PFTs: mild obstruction, FEV1 1.36 L (81%), FEV1/FVC 53%, TLC normal, DLCO 51% pred 02/16/16 PFTs: FVC: 2.23 L (90 %pred), FEV1: 1.30 L (71 %pred), FEV1/FVC: 58% , TLC: invalid, DLCO 78% pred, consistent with mild/moderate obstructive defect. 10/14/2019 2D echo: LVEF 50 to 71%, normal diastolics, normal pulmonary artery systolic pressure 24/58/0998 overnight oximetry on RA: No oxygen desaturations of clinical significance.  Lowest O2 sat 88% of less than 2 minutes duration.  Average O2 sat 93%   INTERVAL: Last seen in this office 05/31/2021 by me.  Persistent cough after COVID-19.  HPI Megan Bradshaw is an 87 year old former smoker (43 PY) who presents for follow-up on the issue of dyspnea and chronic asthmatic bronchitis/stage II COPD.  This is a scheduled visit.  The patient presents with complaint of dyspnea however, this is not over her usual.  Her main issue today is that since her COVID-19 diagnosis she has had worsening of her baseline cough.  She does not have purulent sputum production does have increase in whitish to grayish sputum.  No hemoptysis.  Cough is worse at nighttime and after meals.  She is on Prilosec for her reflux.  She recently had Augmentin prescribed for  diverticulitis.  Did not note any change in the cough while taking this medication.  She also feels fatigue and tired, worse since her COVID-19 diagnosis in May of this year.  Review of Systems A 10 point review of systems was performed and it is as noted above otherwise negative.  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   albuterol (PROVENTIL) (2.5 MG/3ML) 0.083% nebulizer solution Take 3 mLs (2.5 mg total) by nebulization every 4 (four) hours as needed for wheezing or shortness of breath.   albuterol (VENTOLIN HFA) 108 (90 Base) MCG/ACT  inhaler INHALE 1 TO 2 PUFFS BY MOUTH EVERY 6 HOURS FOR PAIN OR WHEEZING OR SHORTNESS OF BREATH   azithromycin (ZITHROMAX) 250 MG tablet Take 1 tablet Monday Wednesday and Friday each week   benzonatate (TESSALON) 200 MG capsule Take 1 capsule (200 mg total) by mouth 3 (three) times daily as needed for cough.   Blood Glucose Monitoring Suppl (ACCU-CHEK GUIDE ME) w/Device KIT See admin instructions.   Cholecalciferol (VITAMIN D PO) Take 1 tablet by mouth daily.   clobetasol cream (TEMOVATE) 9.93  % Apply 1 application topically daily as needed.   dextromethorphan (DELSYM) 30 MG/5ML liquid Take 30 mg by mouth as needed for cough.   fluticasone furoate-vilanterol (BREO ELLIPTA) 100-25 MCG/INH AEPB Inhale 1 puff into the lungs daily.   gabapentin (NEURONTIN) 100 MG capsule TAKE 1 CAPSULE(100 MG) BY MOUTH THREE TIMES DAILY   glipiZIDE (GLUCOTROL XL) 10 MG 24 hr tablet TAKE 1 TABLET(10 MG) BY MOUTH DAILY WITH BREAKFAST   glucose blood (ACCU-CHEK GUIDE) test strip 1 each by Other route 2 (two) times daily.   ipratropium-albuterol (DUONEB) 0.5-2.5 (3) MG/3ML SOLN USE 3 ML VIA NEBULIZER EVERY 6 HOURS AS NEEDED   levothyroxine (SYNTHROID) 75 MCG tablet TAKE 1 TABLET BY MOUTH EVERY DAY BEFORE BREAKFAST   metFORMIN (GLUCOPHAGE) 500 MG tablet Take 1 tablet (500 mg total) by mouth 2 (two) times daily with a meal.   montelukast (SINGULAIR) 10 MG tablet TAKE 1 TABLET(10 MG) BY MOUTH AT BEDTIME   Multiple Vitamin (MULTIVITAMIN) capsule Take 1 capsule by mouth daily.   nystatin (NYSTATIN) powder APPLY EXTERNALLY TO THE AFFECTED AREA TWICE DAILY   omeprazole (PRILOSEC) 20 MG capsule Take 20 mg by mouth daily.   predniSONE (DELTASONE) 5 MG tablet TAKE 1 TABLET(5 MG) BY MOUTH DAILY WITH BREAKFAST   sulfamethoxazole-trimethoprim (BACTRIM DS) 800-160 MG tablet Take 1 tablet by mouth 2 (two) times daily.   Immunization History  Administered Date(s) Administered   Fluad Quad(high Dose 65+) 09/03/2020, 08/02/2021   Influenza,inj,Quad PF,6+ Mos 08/20/2015, 07/22/2016, 08/14/2017, 08/23/2018, 08/29/2019   Influenza-Unspecified 08/14/2014   PFIZER(Purple Top)SARS-COV-2 Vaccination 01/07/2020, 01/28/2020, 10/11/2020   Pneumococcal Conjugate-13 08/20/2015   Pneumococcal Polysaccharide-23 08/29/2019       Objective:   Physical Exam BP 132/76 (BP Location: Left Arm, Cuff Size: Normal)   Pulse 82   Temp 97.9 F (36.6 C) (Temporal)   Ht '5\' 2"'  (1.575 m)   Wt 187 lb 6.4 oz (85 kg)   SpO2 95%   BMI 34.28  kg/m  GENERAL: Overweight elderly female, quite spry.  Presents in transport chair. No conversational dyspnea. No distress. HEAD: Normocephalic, atraumatic. EYES: Pupils equal, round, reactive to light.  No scleral icterus. MOUTH: Nose/mouth/throat not examined due to masking requirements for COVID 19. NECK: Supple. No thyromegaly. Trachea midline. No JVD.  No adenopathy. PULMONARY: Good air entry bilaterally. Coarse with no other adventitious sounds. CARDIOVASCULAR: S1 and S2. Regular rate and rhythm. Soft grade 1/6 systolic ejection murmur left sternal border. No rubs or gallops noted. ABDOMEN: Obese,nondistended. MUSCULOSKELETAL: No joint deformity, no clubbing, no edema.  She has increased AP diameter due to kyphosis.  There is tenderness along the lower right chest wall with no crepitus. NEUROLOGIC: No overt focal deficits noted. Speech is fluent. SKIN: Intact,warm,dry. On limited exam no rashes. PSYCH: Mood and behavior normal       Assessment & Plan:     ICD-10-CM   1. Stage 2 moderate COPD by GOLD classification ([place])  J44.9  Continue Breo Ellipta, her preferred inhaler Sinew albuterol as needed    2. Chronic cough  R05.3    Use Zyrtec at nighttime Start Azithromycin 3 times a week Query bronchiectasis    3. SOB (shortness of breath)  R06.02    This is at baseline    4. Personal history of COVID-19  Z86.16    Issue adds complexity to her management May have COVID long-haul syndrome    5. Obesity (BMI 30.0-34.9)  E66.9    Weight loss recommended     Orders Placed This Encounter  Procedures   Flu Vaccine QUAD High Dose(Fluad)    Meds ordered this encounter  Medications   azithromycin (ZITHROMAX) 250 MG tablet    Sig: Take 1 tablet Monday Wednesday and Friday each week    Dispense:  12 each    Refill:  1    Patient appears relatively well compensated.  Persistent issues with cough and sputum production that does not appear to be purulent.  Query  bronchiectasis particularly with recent CT findings of perifissural cyst.  Trial of Azithromycin 3 times a week, also recommend Zyrtec at bedtime to see if this helps with the cough.  We will see her in follow-up in 3 months time she is to contact us prior to that time should any new difficulties arise.  Renold Don, MD [facility]    *This note was dictated using voice recognition software/Dragon.  Despite best efforts to proofread, errors can occur which can change the meaning.  Any change was purely unintentional.

## 2021-08-02 NOTE — Patient Instructions (Signed)
I am going to start her on Azithromycin 3 times a week Monday Wednesday Friday 1 tablet  Start Zyrtec 10 mg at bedtime see if this helps with your cough  We will see you in follow-up in 3 months time call sooner should any new problems arise  You received flu vaccine today

## 2021-08-31 ENCOUNTER — Other Ambulatory Visit: Payer: Self-pay | Admitting: Family Medicine

## 2021-08-31 ENCOUNTER — Other Ambulatory Visit: Payer: Self-pay | Admitting: Pulmonary Disease

## 2021-08-31 NOTE — Telephone Encounter (Signed)
Last office visit 07/20/2021 follow up acute diverticulitis/E-coli UTI/steroid induced DM.  Last refilled 07/01/2021 for #90 with no refills.  Next Appt: 10/01/2021 for DM.

## 2021-09-07 DIAGNOSIS — E113293 Type 2 diabetes mellitus with mild nonproliferative diabetic retinopathy without macular edema, bilateral: Secondary | ICD-10-CM | POA: Diagnosis not present

## 2021-09-07 LAB — HM DIABETES EYE EXAM

## 2021-09-09 ENCOUNTER — Encounter: Payer: Self-pay | Admitting: Family Medicine

## 2021-09-18 ENCOUNTER — Other Ambulatory Visit: Payer: Self-pay | Admitting: Family Medicine

## 2021-09-19 ENCOUNTER — Other Ambulatory Visit: Payer: Self-pay | Admitting: Internal Medicine

## 2021-09-19 ENCOUNTER — Other Ambulatory Visit: Payer: Self-pay | Admitting: Podiatry

## 2021-09-21 ENCOUNTER — Other Ambulatory Visit: Payer: Self-pay | Admitting: Family Medicine

## 2021-09-21 NOTE — Telephone Encounter (Signed)
Last office visit 07/20/2021 for acute diverticulitis.  Last refilled 12/16/20 for #30 g with 1 refill.  Next Appt: 10/01/21 for DM.

## 2021-09-26 IMAGING — CT CT ABD-PELV W/ CM
2 of 5 series · 16 of 46 positions shown, 18 images · IV contrast (omnipaque)
Comparison: None.

CLINICAL DATA: Diverticulitis suspected

EXAM:
CT ABDOMEN AND PELVIS WITH CONTRAST
TECHNIQUE: Multidetector CT imaging of the abdomen and pelvis was performed
using the standard protocol following bolus administration of
intravenous contrast.
CONTRAST:  85mL OMNIPAQUE IOHEXOL 300 MG/ML  SOLN

[Series 2: abd pelvis 5.00 · axial · 0.69mm/px · z∈[-1497,-1127]mm · 13 of 84 slices shown, 15 images]
[im 5/84  soft-tissue]
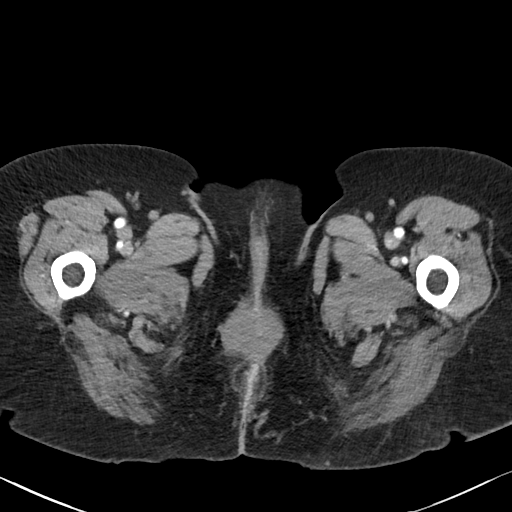
[im 5/84  bone]
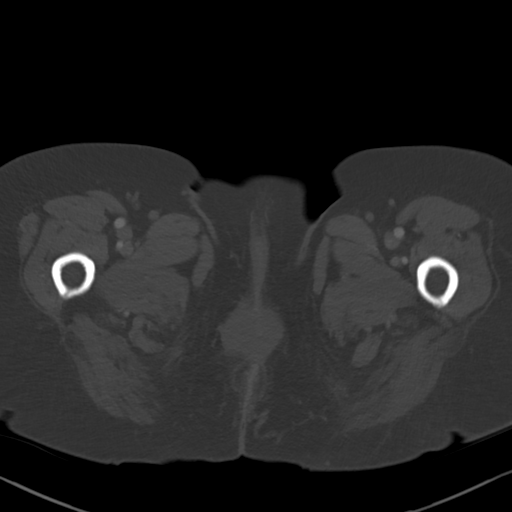
[im 10/84  soft-tissue]
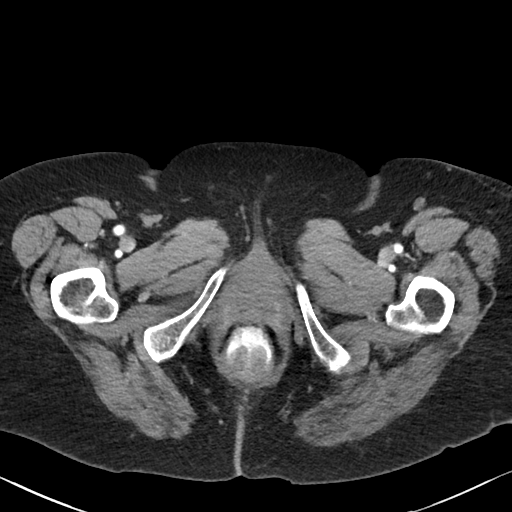
[im 19/84  soft-tissue]
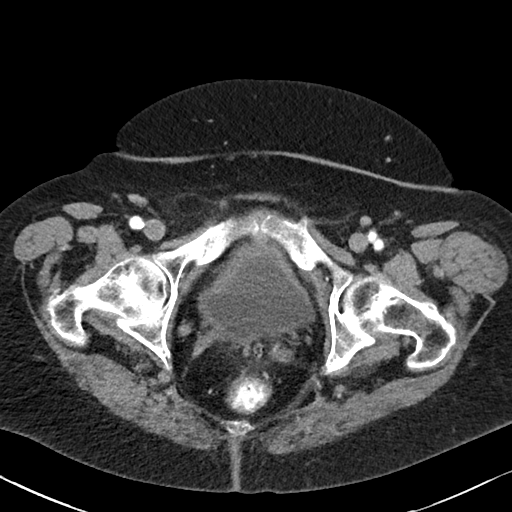
[im 24/84  soft-tissue]
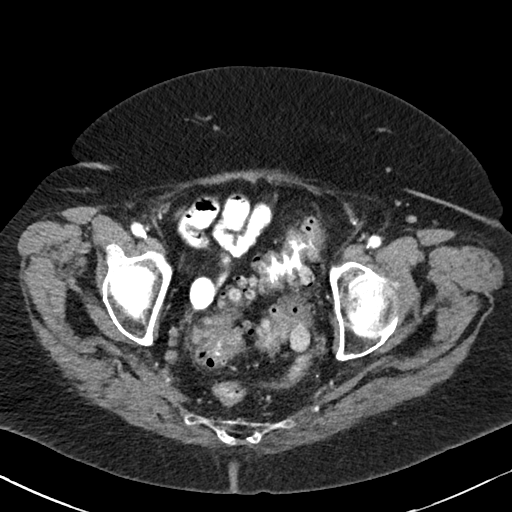
[im 28/84  soft-tissue]
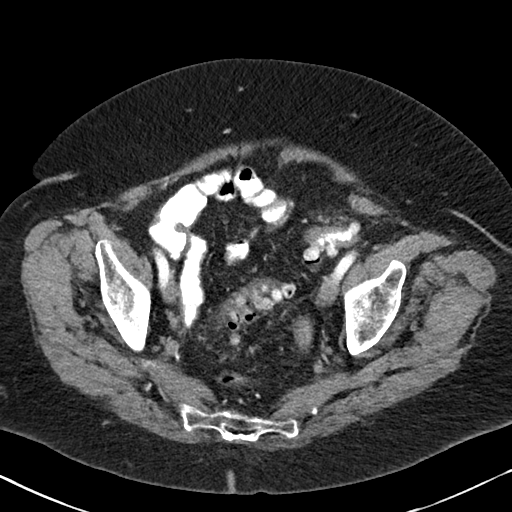
[im 37/84  soft-tissue]
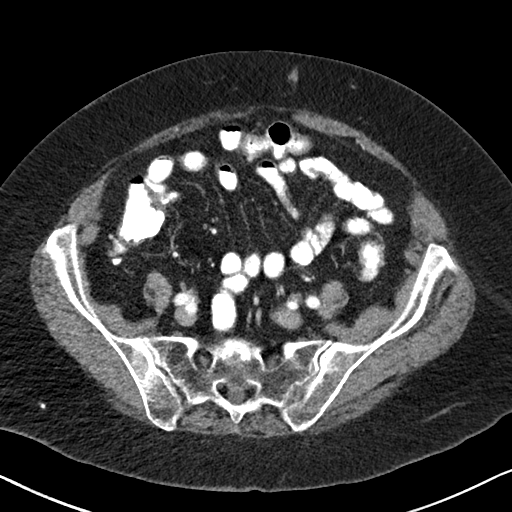
[im 42/84  soft-tissue]
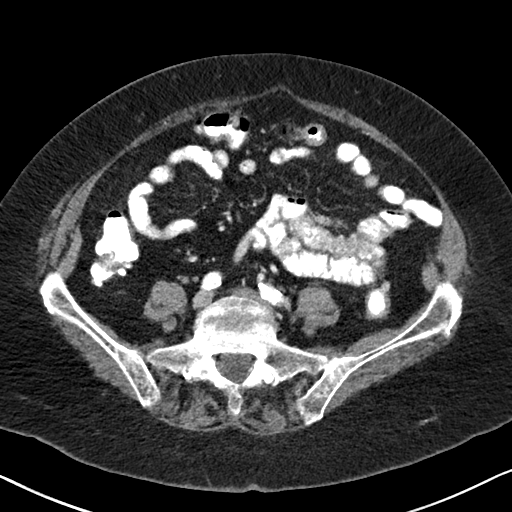
[im 47/84  soft-tissue]
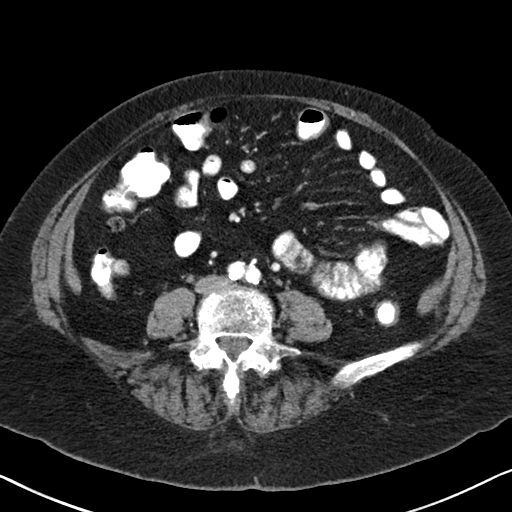
[im 56/84  soft-tissue]
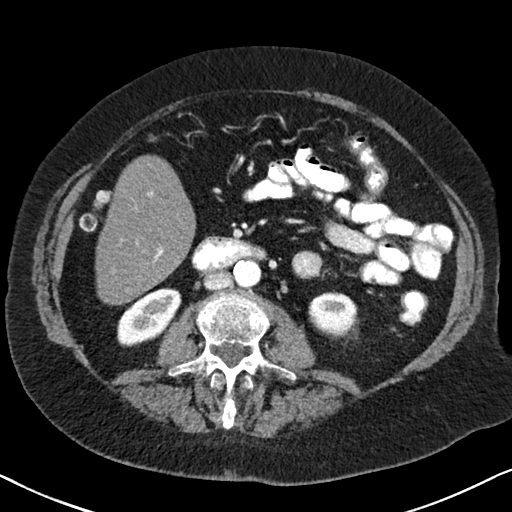
[im 56/84  bone]
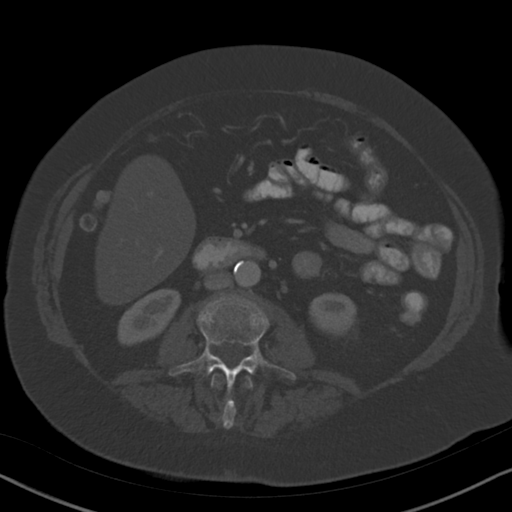
[im 60/84  soft-tissue]
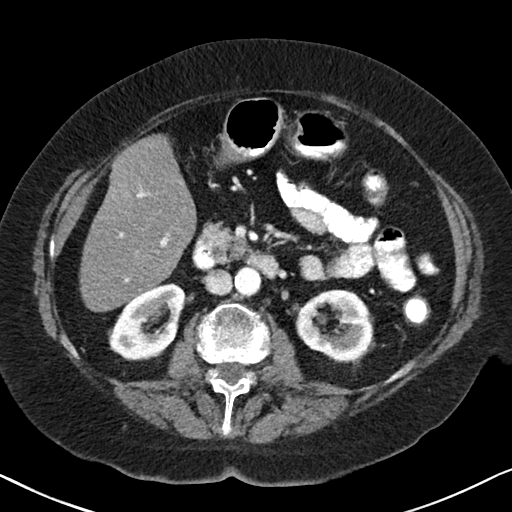
[im 65/84  soft-tissue]
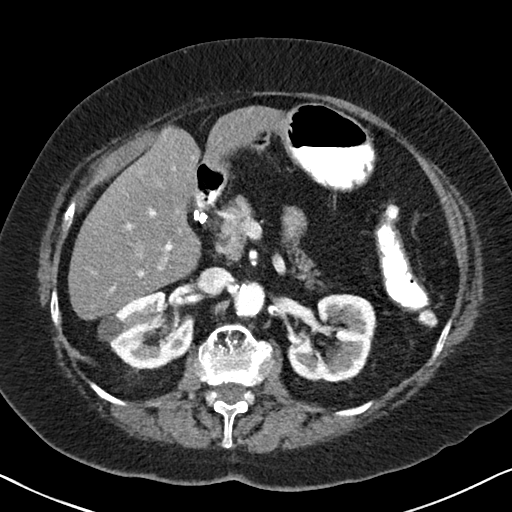
[im 74/84  soft-tissue]
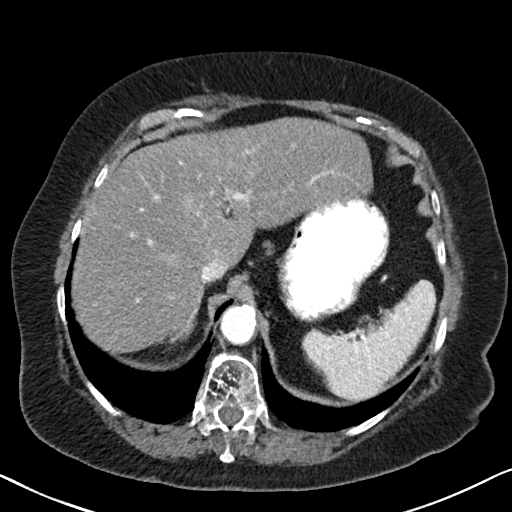
[im 79/84  soft-tissue]
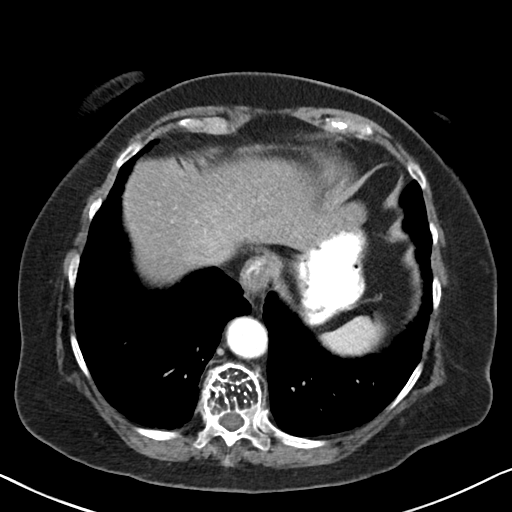

[Series 4: coronals abd pelvis 2.00 cor · coronal · 0.69mm/px · 3 of 158 slices shown]
[im 53/158  soft-tissue]
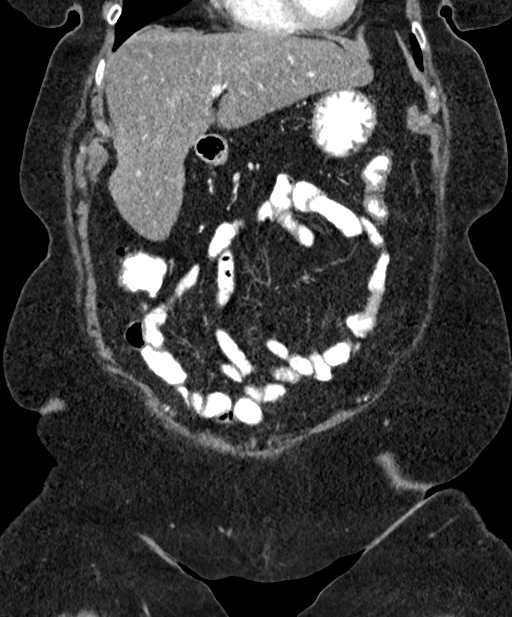
[im 70/158  soft-tissue]
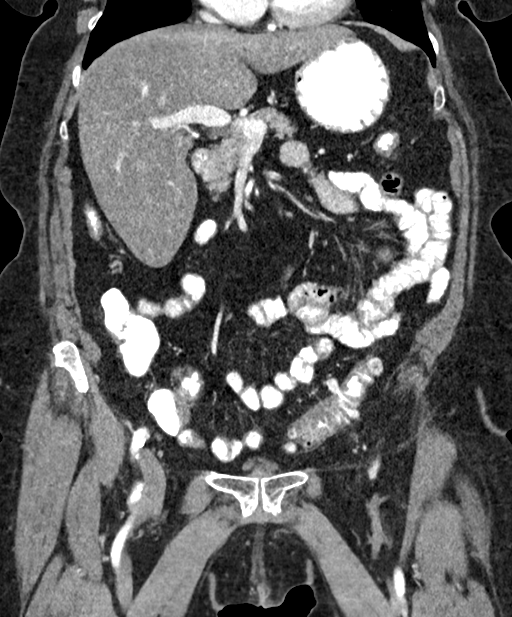
[im 88/158  soft-tissue]
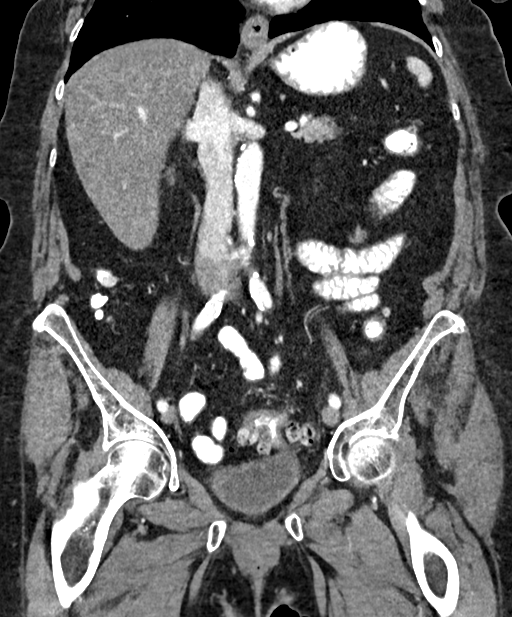

[16 of 46 positions shown; findings below may reference images not displayed]

FINDINGS: Lower chest: Centrilobular emphysema at the lung bases, with large
perifissural cyst on the right and large pleural cyst on the left.
There is left basilar subpleural nodularity, measuring up to 7 mm
(series 3, image 4). Mitral annular calcifications.

Hepatobiliary: Diffuse hepatic steatosis.  Prior cholecystectomy.

Pancreas: Mildly atrophic.  No ductal dilation.

Spleen: Calcified splenic granuloma.  Otherwise unremarkable.

Adrenals/Urinary Tract: Adrenal glands are unremarkable. No
hydronephrosis or nephrolithiasis. There are small bilateral renal
cysts which appear simple. The bladder is mildly distended but
unremarkable.

Stomach/Bowel: Stomach is within normal limits. There is no evidence
of bowel obstruction. Prior appendectomy. There is extensive
diverticulosis of the descending and sigmoid colon. There is mild
bowel wall thickening and adjacent stranding along the sigmoid colon
in the pelvis.

Vascular/Lymphatic: Aortoiliac atherosclerotic calcifications. No
AAA. No lymphadenopathy.

Reproductive: Prior hysterectomy.

Other: No bowel containing hernia. No abdominopelvic ascites. No
drainable fluid collection.

Musculoskeletal: No acute osseous abnormality. No suspicious lytic
or blastic lesion. There are multiple vertebral body hemangiomas.
Multilevel degenerative changes of the spine, with noted grade 1
anterolisthesis at L4-L5 and L5-S1 and lower lumbar predominant
facet arthropathy.
IMPRESSION: Acute uncomplicated sigmoid diverticulitis. No drainable fluid
collection/abscess.

These results will be called to the ordering clinician or
representative by the Radiologist Assistant, and communication
documented in the PACS or [REDACTED].

## 2021-10-01 ENCOUNTER — Encounter: Payer: Self-pay | Admitting: Family Medicine

## 2021-10-01 ENCOUNTER — Other Ambulatory Visit: Payer: Self-pay

## 2021-10-01 ENCOUNTER — Ambulatory Visit (INDEPENDENT_AMBULATORY_CARE_PROVIDER_SITE_OTHER): Payer: Medicare Other | Admitting: Family Medicine

## 2021-10-01 VITALS — BP 138/80 | HR 70 | Temp 97.5°F | Ht 62.0 in | Wt 184.5 lb

## 2021-10-01 DIAGNOSIS — E099 Drug or chemical induced diabetes mellitus without complications: Secondary | ICD-10-CM | POA: Diagnosis not present

## 2021-10-01 DIAGNOSIS — T380X5D Adverse effect of glucocorticoids and synthetic analogues, subsequent encounter: Secondary | ICD-10-CM | POA: Diagnosis not present

## 2021-10-01 DIAGNOSIS — R0982 Postnasal drip: Secondary | ICD-10-CM | POA: Diagnosis not present

## 2021-10-01 LAB — POCT GLYCOSYLATED HEMOGLOBIN (HGB A1C): Hemoglobin A1C: 8 % — AB (ref 4.0–5.6)

## 2021-10-01 NOTE — Patient Instructions (Addendum)
Use metformin twice daily to make it last all day.  Retry Flonase  or Nasacort or nasonex 2 sprays per nostril to see if it helps with cough.

## 2021-10-01 NOTE — Assessment & Plan Note (Signed)
Improving control, A1C goal not as low  Given Age.  Can try metformin twice daily as she is on intermediate acting form... she is hesitent.  Consider next OV changing to Xl for ease of use   GFR 60

## 2021-10-01 NOTE — Assessment & Plan Note (Signed)
Still has not tried flonase... encouraged trial.

## 2021-10-01 NOTE — Progress Notes (Signed)
Patient ID: Megan Bradshaw, female    DOB: 1930-01-14, 87 y.o.   MRN: [id number]  This visit was conducted in person.  Ht '5\' 2"'  (1.575 m)   BMI 34.28 kg/m   Vitals:   10/01/21 0908  BP: 138/80  Pulse: 70  Temp: (!) 97.5 F (36.4 C)  SpO2: 98%     CC:  Chief Complaint  Patient presents with   Diabetes    Subjective:   HPI: Megan Bradshaw is a 87 y.o. female presenting on 10/01/2021 for Diabetes   Diabetes:  Improving. Lab Results  Component Value Date   HGBA1C 8.0 (A) 10/01/2021    She is watching what she is eating,  blood sugar 150 after cough syrup  Using medications without difficulties: Hypoglycemic episodes: none Hyperglycemic episodes:none Feet problems: no ulcers Blood Sugars averaging: 98-120 eye exam within last year:    Treadmill 25-30 min daily  Relevant past medical, surgical, family and social history reviewed and updated as indicated. Interim medical history since our last visit reviewed. Allergies and medications reviewed and updated. Outpatient Medications Prior to Visit  Medication Sig Dispense Refill   Accu-Chek Softclix Lancets lancets as directed.     albuterol (PROVENTIL) (2.5 MG/3ML) 0.083% nebulizer solution Take 3 mLs (2.5 mg total) by nebulization every 4 (four) hours as needed for wheezing or shortness of breath. 150 mL 5   albuterol (VENTOLIN HFA) 108 (90 Base) MCG/ACT inhaler INHALE 1 TO 2 PUFFS BY MOUTH EVERY 6 HOURS FOR PAIN OR WHEEZING OR SHORTNESS OF BREATH 18 g 1   azithromycin (ZITHROMAX) 250 MG tablet Take 1 tablet Monday Wednesday and Friday each week 12 each 1   Blood Glucose Monitoring Suppl (ACCU-CHEK GUIDE ME) w/Device KIT See admin instructions.     Cholecalciferol (VITAMIN D PO) Take 1 tablet by mouth daily.     clobetasol cream (TEMOVATE) 5.36 % Apply 1 application topically daily as needed. 30 g 1   dextromethorphan (DELSYM) 30 MG/5ML liquid Take 30 mg by mouth as needed for cough.     gabapentin (NEURONTIN) 100  MG capsule TAKE 1 CAPSULE(100 MG) BY MOUTH THREE TIMES DAILY 90 capsule 3   glipiZIDE (GLUCOTROL XL) 10 MG 24 hr tablet TAKE 1 TABLET(10 MG) BY MOUTH DAILY WITH BREAKFAST 90 tablet 0   glucose blood (ACCU-CHEK GUIDE) test strip 1 each by Other route 2 (two) times daily. 200 each 2   ipratropium-albuterol (DUONEB) 0.5-2.5 (3) MG/3ML SOLN USE 3 ML VIA NEBULIZER EVERY 6 HOURS AS NEEDED 360 mL 1   levothyroxine (SYNTHROID) 75 MCG tablet TAKE 1 TABLET BY MOUTH EVERY DAY BEFORE BREAKFAST 90 tablet 0   metFORMIN (GLUCOPHAGE) 500 MG tablet Take 1 tablet (500 mg total) by mouth 2 (two) times daily with a meal. 180 tablet 3   montelukast (SINGULAIR) 10 MG tablet TAKE 1 TABLET(10 MG) BY MOUTH AT BEDTIME 30 tablet 3   montelukast (SINGULAIR) 10 MG tablet TAKE 1 TABLET(10 MG) BY MOUTH AT BEDTIME 30 tablet 3   Multiple Vitamin (MULTIVITAMIN) capsule Take 1 capsule by mouth daily.     nystatin powder APPLY EXTERNALLY TO THE AFFECTED AREA TWICE DAILY 30 g 1   omeprazole (PRILOSEC) 20 MG capsule Take 20 mg by mouth daily.     predniSONE (DELTASONE) 5 MG tablet TAKE 1 TABLET(5 MG) BY MOUTH DAILY WITH BREAKFAST 90 tablet 0   sulfamethoxazole-trimethoprim (BACTRIM DS) 800-160 MG tablet Take 1 tablet by mouth 2 (two) times daily. 6 tablet 0  No facility-administered medications prior to visit.     Per HPI unless specifically indicated in ROS section below Review of Systems  Constitutional:  Negative for fatigue and fever.  HENT:  Negative for ear pain.   Eyes:  Negative for pain.  Respiratory:  Negative for chest tightness and shortness of breath.   Cardiovascular:  Negative for chest pain, palpitations and leg swelling.  Gastrointestinal:  Negative for abdominal pain.  Genitourinary:  Negative for dysuria.  Objective:  Ht '5\' 2"'  (1.575 m)   BMI 34.28 kg/m   Wt Readings from Last 3 Encounters:  08/02/21 187 lb 6.4 oz (85 kg)  07/20/21 184 lb (83.5 kg)  07/16/21 182 lb 8 oz (82.8 kg)      Physical  Exam Constitutional:      General: She is not in acute distress.    Appearance: Normal appearance. She is well-developed. She is not ill-appearing or toxic-appearing.  HENT:     Head: Normocephalic.     Right Ear: Hearing, tympanic membrane, ear canal and external ear normal. Tympanic membrane is not erythematous, retracted or bulging.     Left Ear: Hearing, tympanic membrane, ear canal and external ear normal. Tympanic membrane is not erythematous, retracted or bulging.     Nose: No mucosal edema or rhinorrhea.     Right Sinus: No maxillary sinus tenderness or frontal sinus tenderness.     Left Sinus: No maxillary sinus tenderness or frontal sinus tenderness.     Mouth/Throat:     Pharynx: Uvula midline.  Eyes:     General: Lids are normal. Lids are everted, no foreign bodies appreciated.     Conjunctiva/sclera: Conjunctivae normal.     Pupils: Pupils are equal, round, and reactive to light.  Neck:     Thyroid: No thyroid mass or thyromegaly.     Vascular: No carotid bruit.     Trachea: Trachea normal.  Cardiovascular:     Rate and Rhythm: Normal rate and regular rhythm.     Pulses: Normal pulses.     Heart sounds: Normal heart sounds, S1 normal and S2 normal. No murmur heard.   No friction rub. No gallop.  Pulmonary:     Effort: Pulmonary effort is normal. No tachypnea or respiratory distress.     Breath sounds: Normal breath sounds. No decreased breath sounds, wheezing, rhonchi or rales.  Abdominal:     General: Bowel sounds are normal.     Palpations: Abdomen is soft.     Tenderness: There is no abdominal tenderness.  Musculoskeletal:     Cervical back: Normal range of motion and neck supple.  Skin:    General: Skin is warm and dry.     Findings: No rash.  Neurological:     Mental Status: She is alert.  Psychiatric:        Mood and Affect: Mood is not anxious or depressed.        Speech: Speech normal.        Behavior: Behavior normal. Behavior is cooperative.         Thought Content: Thought content normal.        Judgment: Judgment normal.      Results for orders placed or performed in visit on 09/09/21  HM DIABETES EYE EXAM  Result Value Ref Range   HM Diabetic Eye Exam Retinopathy (A) No Retinopathy    This visit occurred during the SARS-CoV-2 public health emergency.  Safety protocols were in place, including screening questions prior to  the visit, additional usage of staff PPE, and extensive cleaning of exam room while observing appropriate contact time as indicated for disinfecting solutions.   COVID 19 screen:  No recent travel or known exposure to COVID19 The patient denies respiratory symptoms of COVID 19 at this time. The importance of social distancing was discussed today.   Assessment and Plan    Problem List Items Addressed This Visit     Post-nasal drip    Still has not tried flonase... encouraged trial.      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) ([place]) - Primary    Improving control, A1C goal not as low  Given Age.  Can try metformin twice daily as she is on intermediate acting form... she is hesitent.  Consider next OV changing to Xl for ease of use   GFR 60      Relevant Orders   POCT glycosylated hemoglobin (Hb A1C) (Completed)     Eliezer Lofts, MD

## 2021-11-18 ENCOUNTER — Ambulatory Visit (INDEPENDENT_AMBULATORY_CARE_PROVIDER_SITE_OTHER): Payer: Medicare Other | Admitting: Pulmonary Disease

## 2021-11-18 ENCOUNTER — Encounter: Payer: Self-pay | Admitting: Pulmonary Disease

## 2021-11-18 ENCOUNTER — Other Ambulatory Visit: Payer: Self-pay

## 2021-11-18 VITALS — BP 120/88 | HR 87 | Temp 97.1°F | Ht 62.0 in | Wt 184.2 lb

## 2021-11-18 DIAGNOSIS — R0602 Shortness of breath: Secondary | ICD-10-CM | POA: Diagnosis not present

## 2021-11-18 DIAGNOSIS — R053 Chronic cough: Secondary | ICD-10-CM

## 2021-11-18 DIAGNOSIS — J449 Chronic obstructive pulmonary disease, unspecified: Secondary | ICD-10-CM

## 2021-11-18 MED ORDER — BENFOTIAMINE MULTI-B PO CAPS: 1.0000 | ORAL_CAPSULE | Freq: Every day | ORAL | 6 refills | Status: DC

## 2021-11-18 NOTE — Patient Instructions (Signed)
I wrote a prescription for your B vitamins sent to your pharmacy.  Once you get it you can stop taking your multivitamin and just take the B vitamin.  We will see you in follow-up in 4 months time call sooner should any new problems arise.

## 2021-11-18 NOTE — Progress Notes (Signed)
Subjective:    Patient ID: Megan Bradshaw, female    DOB: 1930/02/14, 88 y.o.   MRN: [id number]  Patient Care Team: Jinny Sanders, MD as PCP - General (Family Medicine) Debbora Dus, [facility] as Pharmacist (Pharmacist)  Chief Complaint  Patient presents with   Follow-up    Pt profile: 88 y.o. F, former smoker, moved from [place]. Initially seen by Dr Melvyn Novas. Diagnosed with COPD in 2011. Graded as Gold I by Dr.  Melvyn Novas. Chronic prednisone therapy for non-pulmonary diagnosis.  Last PFTs 02/16/2016.  Followed by Dr. Alva Garnet since October 2016. Established with me in November 2020 after Dr. Alva Garnet' departure from the practice.   PROBLEMS: Gold II COPD with chronic asthmatic bronchitis Chronic refractory cough Class III dyspnea out of proportion to PFT parameters   DATA: 10/22/14 PFTs: mild obstruction, FEV1 1.36 L (81%), FEV1/FVC 53%, TLC normal, DLCO 51% pred 02/16/16 PFTs: FVC: 2.23 L (90 %pred), FEV1: 1.30 L (71 %pred), FEV1/FVC: 58% , TLC: invalid, DLCO 78% pred, consistent with mild/moderate obstructive defect. 10/14/2019 2D echo: LVEF 50 to 17%, normal diastolics, normal pulmonary artery systolic pressure 00/17/4944 overnight oximetry on RA: No oxygen desaturations of clinical significance.  Lowest O2 sat 88% of less than 2 minutes duration.  Average O2 sat 93%   INTERVAL: Last seen in this office 11/18/2021 by me.  Exacerbations since last visit.  No new complaint.   HPI Ms. Megan Bradshaw is an 88 year old former smoker (3 PY) who presents for follow-up on the issue of dyspnea and chronic asthmatic bronchitis/stage II COPD.  This is a scheduled visit.  The patient presents with complaint of dyspnea however, this is not over her usual.  Previously she had had issues with increased cough after COVID-19 infection however this has now resolved and her cough is back to baseline which is usually in the mornings.  She does not have purulent sputum production does have increase in whitish to  grayish sputum.  No hemoptysis.  Her main issue with ambulation is not so much dyspnea but fatigue.  This has been a chronic issue for her.  Today she seems to be in fairly good spirits.  Does not have any specific complaint over her baseline.  She continues to try to stay as active as possible.  She does not endorse any other symptomatology today.  Overall she feels well and looks well.  Review of Systems A 10 point review of systems was performed and it is as noted above otherwise negative.  Patient Active Problem List   Diagnosis Date Noted   Other fatigue 03/03/2022   DOE (dyspnea on exertion) 03/03/2022   Dysuria 03/03/2022   Left lower quadrant abdominal pain 03/03/2022   Right upper quadrant abdominal pain 03/03/2022   Neuropathy due to type 2 diabetes mellitus ([place]) 03/26/2021   Arthritis 12/07/2017   OAB (overactive bladder) 09/01/2016   Seasonal allergies 09/01/2016   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) ([place]) 09/22/2014   Hypothyroid 09/22/2014   Stage 2 moderate COPD by GOLD classification ([place]) 08/12/2014   Social History   Tobacco Use   Smoking status: Former    Packs/day: 1.50    Years: 30.00    Pack years: 45.00    Types: Cigarettes    Quit date: 11/15/1987    Years since quitting: 34.4   Smokeless tobacco: Never  Substance Use Topics   Alcohol use: No    Alcohol/week: 0.0 standard drinks   Allergies  Allergen Reactions   Augmentin [Amoxicillin-Pot  Clavulanate] Diarrhea   Current Meds  Medication Sig   Accu-Chek Softclix Lancets lancets as directed.   albuterol (PROVENTIL) (2.5 MG/3ML) 0.083% nebulizer solution Take 3 mLs (2.5 mg total) by nebulization every 4 (four) hours as needed for wheezing or shortness of breath.   B Complex-Folic Acid (BENFOTIAMINE MULTI-B) CAPS Take 1 capsule by mouth daily.   Blood Glucose Monitoring Suppl (ACCU-CHEK GUIDE ME) w/Device KIT See admin instructions.   Cholecalciferol (VITAMIN D PO) Take  1 tablet by mouth daily.   clobetasol cream (TEMOVATE) 2.97 % Apply 1 application topically daily as needed.   dextromethorphan (DELSYM) 30 MG/5ML liquid Take 30 mg by mouth as needed for cough.   ipratropium-albuterol (DUONEB) 0.5-2.5 (3) MG/3ML SOLN USE 3 ML VIA NEBULIZER EVERY 6 HOURS AS NEEDED   montelukast (SINGULAIR) 10 MG tablet TAKE 1 TABLET(10 MG) BY MOUTH AT BEDTIME   Multiple Vitamin (MULTIVITAMIN) capsule Take 1 capsule by mouth daily.   nystatin powder APPLY EXTERNALLY TO THE AFFECTED AREA TWICE DAILY   omeprazole (PRILOSEC) 20 MG capsule Take 20 mg by mouth daily.   [DISCONTINUED] albuterol (VENTOLIN HFA) 108 (90 Base) MCG/ACT inhaler INHALE 1 TO 2 PUFFS BY MOUTH EVERY 6 HOURS FOR PAIN OR WHEEZING OR SHORTNESS OF BREATH   [DISCONTINUED] azithromycin (ZITHROMAX) 250 MG tablet Take 1 tablet Monday Wednesday and Friday each week   [DISCONTINUED] gabapentin (NEURONTIN) 100 MG capsule TAKE 1 CAPSULE(100 MG) BY MOUTH THREE TIMES DAILY   [DISCONTINUED] glipiZIDE (GLUCOTROL XL) 10 MG 24 hr tablet TAKE 1 TABLET(10 MG) BY MOUTH DAILY WITH BREAKFAST   [DISCONTINUED] glucose blood (ACCU-CHEK GUIDE) test strip 1 each by Other route 2 (two) times daily.   [DISCONTINUED] levothyroxine (SYNTHROID) 75 MCG tablet TAKE 1 TABLET BY MOUTH EVERY DAY BEFORE BREAKFAST   [DISCONTINUED] metFORMIN (GLUCOPHAGE) 500 MG tablet Take 1 tablet (500 mg total) by mouth 2 (two) times daily with a meal.   [DISCONTINUED] predniSONE (DELTASONE) 5 MG tablet TAKE 1 TABLET(5 MG) BY MOUTH DAILY WITH BREAKFAST   Immunization History  Administered Date(s) Administered   Fluad Quad(high Dose 65+) 09/03/2020, 08/02/2021   Influenza,inj,Quad PF,6+ Mos 08/20/2015, 07/22/2016, 08/14/2017, 08/23/2018, 08/29/2019   Influenza-Unspecified 08/14/2014   PFIZER(Purple Top)SARS-COV-2 Vaccination 01/07/2020, 01/28/2020, 10/11/2020   Pneumococcal Conjugate-13 08/20/2015   Pneumococcal Polysaccharide-23 08/29/2019       Objective:    Physical Exam BP 120/88 (BP Location: Left Arm, Patient Position: Sitting, Cuff Size: Normal)   Pulse 87   Temp (!) 97.1 F (36.2 C) (Oral)   Ht _0  (1.575 m)   Wt 184 lb 3.2 oz (83.6 kg)   SpO2 98%   BMI 33.69 kg/m  GENERAL: Overweight elderly female, quite spry.  Fully ambulatory today with assistance of a cane. No conversational dyspnea. No distress. HEAD: Normocephalic, atraumatic. EYES: Pupils equal, round, reactive to light.  No scleral icterus. MOUTH: Nose/mouth/throat not examined due to masking requirements for COVID 19. NECK: Supple. No thyromegaly. Trachea midline. No JVD.  No adenopathy. PULMONARY: Good air entry bilaterally. Coarse with no other adventitious sounds. CARDIOVASCULAR: S1 and S2. Regular rate and rhythm. Soft grade 1/6 systolic ejection murmur left sternal border. No rubs or gallops noted. ABDOMEN: Obese,nondistended. MUSCULOSKELETAL: No joint deformity, no clubbing, no edema.  She has increased AP diameter due to kyphosis.  There is tenderness along the lower right chest wall with no crepitus. NEUROLOGIC: No overt focal deficits noted. Speech is fluent. SKIN: Intact,warm,dry. On limited exam no rashes. PSYCH: Mood and behavior normal  Assessment & Plan:     ICD-10-CM   1. Stage 2 moderate COPD by GOLD classification ([place])  J44.9    Continue DuoNeb nebulizers as needed    2. SOB (shortness of breath)  R06.02    Is at baseline Recommended pulmonary rehab Patient declines    3. Chronic cough  R05.3    Appears to be controlled At baseline     Overall Julieann appears to be stable.  We will see her in follow-up in 4 months time she is to contact us prior to that time should any new difficulties arise.  Renold Don, MD Advanced Bronchoscopy PCCM [facility]    *This note was dictated using voice recognition software/Dragon.  Despite best efforts to proofread, errors can occur which can change the meaning. Any  transcriptional errors that result from this process are unintentional and may not be fully corrected at the time of dictation.

## 2021-12-03 ENCOUNTER — Other Ambulatory Visit: Payer: Self-pay | Admitting: Family Medicine

## 2021-12-05 ENCOUNTER — Other Ambulatory Visit: Payer: Self-pay | Admitting: Internal Medicine

## 2021-12-05 NOTE — Telephone Encounter (Signed)
Pt of [facility]. Routing to their office

## 2021-12-06 DIAGNOSIS — H353131 Nonexudative age-related macular degeneration, bilateral, early dry stage: Secondary | ICD-10-CM | POA: Diagnosis not present

## 2021-12-06 LAB — HM DIABETES EYE EXAM

## 2021-12-23 ENCOUNTER — Other Ambulatory Visit: Payer: Self-pay | Admitting: Internal Medicine

## 2021-12-23 NOTE — Telephone Encounter (Signed)
Patient no longer under prescriber care Requested Prescriptions  Pending Prescriptions Disp Refills   ACCU-CHEK GUIDE test strip [Pharmacy Med Name: ACCU-CHEK GUIDE TEST STRIPS 50] 200 strip     Sig: TEST TWICE DAILY     Endocrinology: Diabetes - Testing Supplies Failed - 12/23/2021 12:07 PM      Failed - Valid encounter within last 12 months    Recent Outpatient Visits   None     Future Appointments            In 1 week Jinny Sanders, MD [place] [facility] [place], [facility]

## 2021-12-31 ENCOUNTER — Other Ambulatory Visit: Payer: Self-pay | Admitting: Family Medicine

## 2022-01-04 ENCOUNTER — Ambulatory Visit (INDEPENDENT_AMBULATORY_CARE_PROVIDER_SITE_OTHER): Payer: Medicare Other | Admitting: Family Medicine

## 2022-01-04 ENCOUNTER — Encounter: Payer: Self-pay | Admitting: Family Medicine

## 2022-01-04 ENCOUNTER — Other Ambulatory Visit: Payer: Self-pay

## 2022-01-04 VITALS — BP 122/78 | HR 76 | Temp 97.8°F | Ht 62.0 in | Wt 186.0 lb

## 2022-01-04 DIAGNOSIS — J449 Chronic obstructive pulmonary disease, unspecified: Secondary | ICD-10-CM

## 2022-01-04 DIAGNOSIS — T380X5D Adverse effect of glucocorticoids and synthetic analogues, subsequent encounter: Secondary | ICD-10-CM | POA: Diagnosis not present

## 2022-01-04 DIAGNOSIS — E1140 Type 2 diabetes mellitus with diabetic neuropathy, unspecified: Secondary | ICD-10-CM | POA: Diagnosis not present

## 2022-01-04 DIAGNOSIS — E039 Hypothyroidism, unspecified: Secondary | ICD-10-CM

## 2022-01-04 DIAGNOSIS — Z0000 Encounter for general adult medical examination without abnormal findings: Secondary | ICD-10-CM | POA: Diagnosis not present

## 2022-01-04 DIAGNOSIS — E099 Drug or chemical induced diabetes mellitus without complications: Secondary | ICD-10-CM | POA: Diagnosis not present

## 2022-01-04 LAB — COMPREHENSIVE METABOLIC PANEL
ALT: 15 U/L (ref 0–35)
AST: 17 U/L (ref 0–37)
Albumin: 4.3 g/dL (ref 3.5–5.2)
Alkaline Phosphatase: 66 U/L (ref 39–117)
BUN: 23 mg/dL (ref 6–23)
CO2: 27 mEq/L (ref 19–32)
Calcium: 9.2 mg/dL (ref 8.4–10.5)
Chloride: 99 mEq/L (ref 96–112)
Creatinine, Ser: 0.98 mg/dL (ref 0.40–1.20)
GFR: 51.17 mL/min — ABNORMAL LOW (ref >60.00)
Glucose, Bld: 257 mg/dL — ABNORMAL HIGH (ref 70–99)
Potassium: 4.8 mEq/L (ref 3.5–5.1)
Sodium: 134 mEq/L — ABNORMAL LOW (ref 135–145)
Total Bilirubin: 0.4 mg/dL (ref 0.2–1.2)
Total Protein: 6.9 g/dL (ref 6.0–8.3)

## 2022-01-04 LAB — MICROALBUMIN / CREATININE URINE RATIO
Creatinine,U: 67.6 mg/dL
Microalb Creat Ratio: 1.1 mg/g (ref 0.0–30.0)
Microalb, Ur: 0.7 mg/dL (ref 0.0–1.9)

## 2022-01-04 LAB — T4, FREE: Free T4: 0.97 ng/dL (ref 0.60–1.60)

## 2022-01-04 LAB — T3, FREE: T3, Free: 2.8 pg/mL (ref 2.3–4.2)

## 2022-01-04 LAB — HEMOGLOBIN A1C: Hgb A1c MFr Bld: 8 % — ABNORMAL HIGH (ref 4.6–6.5)

## 2022-01-04 LAB — TSH: TSH: 1.81 u[IU]/mL (ref 0.35–5.50)

## 2022-01-04 MED ORDER — METFORMIN HCL 500 MG PO TABS: 500.0000 mg | ORAL_TABLET | Freq: Two times a day (BID) | ORAL | 3 refills | Status: DC

## 2022-01-04 NOTE — Progress Notes (Signed)
Patient ID: Megan Bradshaw, female    DOB: [birth year], 88 y.o.   MRN: [id number]  This visit was conducted in person.  BP 122/78 (BP Location: Left Arm, Patient Position: Sitting, Cuff Size: Normal)    Pulse 76    Temp 97.8 F (36.6 C) (Oral)    Ht '5\' 2"'  (1.575 m)    Wt 186 lb (84.4 kg)    SpO2 94%    BMI 34.02 kg/m    CC:  Chief Complaint  Patient presents with   Medicare Wellness    Subjective:   HPI: Megan Bradshaw is a 88 y.o. female presenting on 01/04/2022 for Medicare Wellness The patient presents for annual medicare wellness, complete physical and review of chronic health problems. He/She also has the following acute concerns today:  I have personally reviewed the Medicare Annual Wellness questionnaire and have noted 1. The patient's medical and social history 2. Their use of alcohol, tobacco or illicit drugs 3. Their current medications and supplements 4. The patient's functional ability including ADL's, fall risks, home safety risks and hearing or visual             impairment. 5. Diet and physical activities 6. Evidence for depression or mood disorders 7.         Updated provider list Cognitive evaluation was performed and recorded on pt medicare questionnaire form. The patients weight, height, BMI and visual acuity have been recorded in the chart   I have made referrals, counseling and provided education to the patient based review of the above and I have provided the pt with a written personalized care plan for preventive services.   Documentation of this information was scanned into the electronic record under the media tab.   Advance directives and end of life planning reviewed in detail with patient and documented in EMR. Patient given handout on advance care directives if needed. HCPOA and living will updated if needed.  Hearing Screening   '500Hz'  '1000Hz'  '2000Hz'  '4000Hz'   Right ear 0 0 0 0  Left ear 0 0 0 0  Vision Screening - Comments:: Exam done  11/2021  One accidental falls in last year  PHQ2: 0  Steroid induced diabetes mellitus .. due for re-eval on metformin  1000 mg daily ( 2  x 512m tablets daily)and glipizide. Lab Results  Component Value Date   HGBA1C 8.0 (A) 10/01/2021  Neuropathy due to DM on gabapentin.  FBS 90-102, no < 60   Wt Readings from Last 3 Encounters:  01/04/22 186 lb (84.4 kg)  11/18/21 184 lb 3.2 oz (83.6 kg)  10/01/21 184 lb 8 oz (83.7 kg)    COPD stable: followed by Pulmonary on albuterol prn, Breo inhaler and low dose daily prednisone.         Relevant past medical, surgical, family and social history reviewed and updated as indicated. Interim medical history since our last visit reviewed. Allergies and medications reviewed and updated. Outpatient Medications Prior to Visit  Medication Sig Dispense Refill   ACCU-CHEK GUIDE test strip TEST TWICE DAILY 200 strip 1   Accu-Chek Softclix Lancets lancets as directed.     albuterol (PROVENTIL) (2.5 MG/3ML) 0.083% nebulizer solution Take 3 mLs (2.5 mg total) by nebulization every 4 (four) hours as needed for wheezing or shortness of breath. 150 mL 5   albuterol (VENTOLIN HFA) 108 (90 Base) MCG/ACT inhaler INHALE 1 TO 2 PUFFS BY MOUTH EVERY 6 HOURS FOR PAIN OR WHEEZING OR SHORTNESS OF BREATH 18 g  2   B Complex-Folic Acid (BENFOTIAMINE MULTI-B) CAPS Take 1 capsule by mouth daily. 30 capsule 6   Blood Glucose Monitoring Suppl (ACCU-CHEK GUIDE ME) w/Device KIT See admin instructions.     Cholecalciferol (VITAMIN D PO) Take 1 tablet by mouth daily.     clobetasol cream (TEMOVATE) 8.18 % Apply 1 application topically daily as needed. 30 g 1   dextromethorphan (DELSYM) 30 MG/5ML liquid Take 30 mg by mouth as needed for cough.     gabapentin (NEURONTIN) 100 MG capsule TAKE 1 CAPSULE(100 MG) BY MOUTH THREE TIMES DAILY 90 capsule 3   glipiZIDE (GLUCOTROL XL) 10 MG 24 hr tablet TAKE 1 TABLET(10 MG) BY MOUTH DAILY WITH BREAKFAST 90 tablet 0    ipratropium-albuterol (DUONEB) 0.5-2.5 (3) MG/3ML SOLN USE 3 ML VIA NEBULIZER EVERY 6 HOURS AS NEEDED 360 mL 1   levothyroxine (SYNTHROID) 75 MCG tablet TAKE 1 TABLET BY MOUTH EVERY DAY BEFORE BREAKFAST 90 tablet 0   metFORMIN (GLUCOPHAGE) 500 MG tablet Take 1 tablet (500 mg total) by mouth 2 (two) times daily with a meal. 180 tablet 3   montelukast (SINGULAIR) 10 MG tablet TAKE 1 TABLET(10 MG) BY MOUTH AT BEDTIME 30 tablet 3   Multiple Vitamin (MULTIVITAMIN) capsule Take 1 capsule by mouth daily.     nystatin powder APPLY EXTERNALLY TO THE AFFECTED AREA TWICE DAILY 30 g 1   omeprazole (PRILOSEC) 20 MG capsule Take 20 mg by mouth daily.     predniSONE (DELTASONE) 5 MG tablet TAKE 1 TABLET(5 MG) BY MOUTH DAILY WITH BREAKFAST 90 tablet 0   No facility-administered medications prior to visit.     Per HPI unless specifically indicated in ROS section below Review of Systems  Constitutional:  Negative for fatigue and fever.  HENT:  Negative for ear pain.   Eyes:  Negative for pain.  Respiratory:  Positive for shortness of breath. Negative for chest tightness.   Cardiovascular:  Negative for chest pain, palpitations and leg swelling.  Gastrointestinal:  Negative for abdominal pain.  Genitourinary:  Negative for dysuria.  Objective:  BP 122/78 (BP Location: Left Arm, Patient Position: Sitting, Cuff Size: Normal)    Pulse 76    Temp 97.8 F (36.6 C) (Oral)    Ht '5\' 2"'  (1.575 m)    Wt 186 lb (84.4 kg)    SpO2 94%    BMI 34.02 kg/m   Wt Readings from Last 3 Encounters:  01/04/22 186 lb (84.4 kg)  11/18/21 184 lb 3.2 oz (83.6 kg)  10/01/21 184 lb 8 oz (83.7 kg)      Physical Exam Constitutional:      General: She is not in acute distress.    Appearance: Normal appearance. She is well-developed. She is not ill-appearing or toxic-appearing.  HENT:     Head: Normocephalic.     Right Ear: Hearing, tympanic membrane, ear canal and external ear normal. Tympanic membrane is not erythematous,  retracted or bulging.     Left Ear: Hearing, tympanic membrane, ear canal and external ear normal. Tympanic membrane is not erythematous, retracted or bulging.     Nose: No mucosal edema or rhinorrhea.     Right Sinus: No maxillary sinus tenderness or frontal sinus tenderness.     Left Sinus: No maxillary sinus tenderness or frontal sinus tenderness.     Mouth/Throat:     Pharynx: Uvula midline.  Eyes:     General: Lids are normal. Lids are everted, no foreign bodies appreciated.  Conjunctiva/sclera: Conjunctivae normal.     Pupils: Pupils are equal, round, and reactive to light.  Neck:     Thyroid: No thyroid mass or thyromegaly.     Vascular: No carotid bruit.     Trachea: Trachea normal.  Cardiovascular:     Rate and Rhythm: Normal rate and regular rhythm.     Pulses: Normal pulses.     Heart sounds: Normal heart sounds, S1 normal and S2 normal. No murmur heard.   No friction rub. No gallop.  Pulmonary:     Effort: Pulmonary effort is normal. No tachypnea or respiratory distress.     Breath sounds: Normal breath sounds. No decreased breath sounds, wheezing, rhonchi or rales.  Abdominal:     General: Bowel sounds are normal.     Palpations: Abdomen is soft.     Tenderness: There is no abdominal tenderness.  Musculoskeletal:     Cervical back: Normal range of motion and neck supple.  Skin:    General: Skin is warm and dry.     Findings: No rash.  Neurological:     Mental Status: She is alert.  Psychiatric:        Mood and Affect: Mood is not anxious or depressed.        Speech: Speech normal.        Behavior: Behavior normal. Behavior is cooperative.        Thought Content: Thought content normal.        Judgment: Judgment normal.      Results for orders placed or performed in visit on 10/01/21  POCT glycosylated hemoglobin (Hb A1C)  Result Value Ref Range   Hemoglobin A1C 8.0 (A) 4.0 - 5.6 %   HbA1c POC (<> result, manual entry)     HbA1c, POC (prediabetic range)      HbA1c, POC (controlled diabetic range)      This visit occurred during the SARS-CoV-2 public health emergency.  Safety protocols were in place, including screening questions prior to the visit, additional usage of staff PPE, and extensive cleaning of exam room while observing appropriate contact time as indicated for disinfecting solutions.   COVID 19 screen:  No recent travel or known exposure to COVID19 The patient denies respiratory symptoms of COVID 19 at this time. The importance of social distancing was discussed today.   Assessment and Plan   The patient's preventative maintenance and recommended screening tests for an annual wellness exam were reviewed in full today. Brought up to date unless services declined.  Counselled on the importance of diet, exercise, and its role in overall health and mortality. The patient's FH and SH was reviewed, including their home life, tobacco status, and drug and alcohol status.   Vaccines:   COVID x 3, PNA x 2, flu 2021, Td due Pap/DVE:  Not indicated Mammo: not indicated Bone Density: DUE Colon:  Not indicated given age Smoking Status: former, aged out of lung cancer screening CT program ETOH/ drug use: none/none  Hep C:  Done in past  Problem List Items Addressed This Visit     Hypothyroid    Due for re-eval  levo thyroxine 75 mcg daily      Relevant Orders   T4, free   T3, free   TSH   Neuropathy due to type 2 diabetes mellitus ([place])    Chronic, associated with diabetes.. will try increased dose of gabapentin to 400 mg at night given sleep disrupted due to leg foot pain.  Relevant Medications   metFORMIN (GLUCOPHAGE) 500 MG tablet   Stage 2 moderate COPD by GOLD classification ([place])    Stable followed by Pulmonary.  refilled prednisone low dose.      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) ([place])    Likely improved control on metformin 500 mg BID and glipizide xl 10 mg daily       Relevant Medications   metFORMIN (GLUCOPHAGE) 500 MG tablet   Other Relevant Orders   Comprehensive metabolic panel   Hemoglobin A1c   Microalbumin / creatinine urine ratio   Other Visit Diagnoses     Medicare annual wellness visit, subsequent    -  Primary       Eliezer Lofts, MD

## 2022-01-04 NOTE — Assessment & Plan Note (Signed)
Due for re-eval  levo thyroxine 75 mcg daily

## 2022-01-04 NOTE — Assessment & Plan Note (Signed)
Chronic, associated with diabetes.. will try increased dose of gabapentin to 400 mg at night given sleep disrupted due to leg foot pain.

## 2022-01-04 NOTE — Patient Instructions (Addendum)
Please stop at the lab to have labs drawn. Blood sugar is dropping < 60, or if you are symptomatic from  with blood sugar in 70s.   Can try  gabapentin at night at 400 mg to see if pain in legs and sleep are improved.    Please call the location of your choice from the menu below to schedule your Bone Density appointment.    [place] at [facility]   Phone:  [phone]   [address], [place] [address]                                            Services: 3D Mammogram and [place]  [place] at [facility])  Phone:  [phone]   [address] [place], [place] [address]                                              Services:  3D Mammogram and Bone Density

## 2022-01-04 NOTE — Assessment & Plan Note (Signed)
Likely improved control on metformin 500 mg BID and glipizide xl 10 mg daily

## 2022-01-04 NOTE — Assessment & Plan Note (Addendum)
Stable followed by Pulmonary.  refilled prednisone low dose.

## 2022-01-10 ENCOUNTER — Encounter: Payer: Self-pay | Admitting: *Deleted

## 2022-01-20 ENCOUNTER — Other Ambulatory Visit: Payer: Self-pay | Admitting: Family Medicine

## 2022-01-20 NOTE — Telephone Encounter (Signed)
Last office visit 01/04/22 for [place].  Last refilled 08/31/21 for #90 with no refills.  No future appointments. ?

## 2022-02-07 ENCOUNTER — Telehealth: Payer: Self-pay | Admitting: Family Medicine

## 2022-02-07 NOTE — Telephone Encounter (Signed)
Handicap Placard Application completed and placed in Dr. Rometta Emery office in box for signature.  ?

## 2022-02-07 NOTE — Telephone Encounter (Signed)
Type of forms received:Parking placard form ? ?Routed [id number] ? ?Paperwork received by : Gwynn Burly ? ? ?Individual made aware of 3-5 business day turn around (Y/N): Y ? ?Form completed and patient made aware of charges(Y/N): Y ? ? ?Faxed to :  ? ?Form location: Place in mail box ? ?

## 2022-02-20 ENCOUNTER — Other Ambulatory Visit: Payer: Self-pay | Admitting: Podiatry

## 2022-02-21 ENCOUNTER — Encounter: Payer: Self-pay | Admitting: Family

## 2022-02-21 ENCOUNTER — Ambulatory Visit (INDEPENDENT_AMBULATORY_CARE_PROVIDER_SITE_OTHER): Payer: Medicare Other | Admitting: Family

## 2022-02-21 VITALS — BP 118/70 | HR 83 | Temp 99.7°F | Resp 16 | Ht 62.0 in | Wt 187.6 lb

## 2022-02-21 DIAGNOSIS — J0110 Acute frontal sinusitis, unspecified: Secondary | ICD-10-CM

## 2022-02-21 MED ORDER — AMOXICILLIN-POT CLAVULANATE 875-125 MG PO TABS: 1.0000 | ORAL_TABLET | Freq: Two times a day (BID) | ORAL | 0 refills | Status: DC

## 2022-02-21 NOTE — Progress Notes (Signed)
? ?Established Patient Office Visit ? ?Subjective:  ?Patient ID: Megan Bradshaw, female    DOB: [birth year]  Age: 88 y.o. MRN: [id number] ? ?CC:  ?Chief Complaint  ?Patient presents with  ? Sinus Problem  ? ? ?HPI ?Megan Bradshaw is here today with concerns.  ? ?Last week went to [place], and ever since then has felt sick.  ?She has h/o copd and asthma.  ?She has sinus congestion, sinus pressure, post nasal drip and making her feel nauseous. She does have bil lower axillary pain, lymph nodes possibly.  ?No chest congestion. No cough.  ?No ear pain no sore throat.  ?No sob out of ordinary.  ? ?Not taking anything otc however doing nebulizer three times a day but not so much helping her.  ?Today temp 99.7 F ? ?Past Medical History:  ?Diagnosis Date  ? Arthritis   ? Asthma   ? COPD (chronic obstructive pulmonary disease) ([place])   ? Diabetes ([place])   ? Thyroid disease   ? ? ?Past Surgical History:  ?Procedure Laterality Date  ? ABDOMINAL HYSTERECTOMY  1984  ? benign, TAH.  ? APPENDECTOMY  1939   ? CATARACT EXTRACTION, BILATERAL    ? CHOLECYSTECTOMY  1987  ? NASAL SINUS SURGERY  1990  ? REPLACEMENT TOTAL KNEE Right 2007  ? ? ?Family History  ?Problem Relation Age of Onset  ? Arthritis Father   ? Diabetes Sister   ? Diabetes Daughter   ? Cancer Neg Hx   ? Heart disease Neg Hx   ? Stroke Neg Hx   ? ? ?Social History  ? ?Socioeconomic History  ? Marital status: Widowed  ?  Spouse name: Not on file  ? Number of children: 4  ? Years of education: Not on file  ? Highest education level: Not on file  ?Occupational History  ? Occupation: Retired  ?Tobacco Use  ? Smoking status: Former  ?  Packs/day: 1.50  ?  Years: 30.00  ?  Pack years: 45.00  ?  Types: Cigarettes  ?  Quit date: 11/15/1987  ?  Years since quitting: 34.2  ? Smokeless tobacco: Never  ?Vaping Use  ? Vaping Use: Never used  ?Substance and Sexual Activity  ? Alcohol use: No  ?  Alcohol/week: 0.0 standard drinks  ? Drug use: No  ? Sexual activity: Not on file  ?Other Topics  Concern  ? Not on file  ?Social History Narrative  ? Not on file  ? ?Social Determinants of Health  ? ?Financial Resource Strain: Not on file  ?Food Insecurity: Not on file  ?Transportation Needs: Not on file  ?Physical Activity: Not on file  ?Stress: Not on file  ?Social Connections: Not on file  ?Intimate Partner Violence: Not on file  ? ? ?Outpatient Medications Prior to Visit  ?Medication Sig Dispense Refill  ? ACCU-CHEK GUIDE test strip TEST TWICE DAILY 200 strip 1  ? Accu-Chek Softclix Lancets lancets as directed.    ? albuterol (PROVENTIL) (2.5 MG/3ML) 0.083% nebulizer solution Take 3 mLs (2.5 mg total) by nebulization every 4 (four) hours as needed for wheezing or shortness of breath. 150 mL 5  ? albuterol (VENTOLIN HFA) 108 (90 Base) MCG/ACT inhaler INHALE 1 TO 2 PUFFS BY MOUTH EVERY 6 HOURS FOR PAIN OR WHEEZING OR SHORTNESS OF BREATH 18 g 2  ? B Complex-Folic Acid (BENFOTIAMINE MULTI-B) CAPS Take 1 capsule by mouth daily. 30 capsule 6  ? Blood Glucose Monitoring Suppl (ACCU-CHEK GUIDE ME) w/Device KIT See  admin instructions.    ? Cholecalciferol (VITAMIN D PO) Take 1 tablet by mouth daily.    ? clobetasol cream (TEMOVATE) 1.44 % Apply 1 application topically daily as needed. 30 g 1  ? dextromethorphan (DELSYM) 30 MG/5ML liquid Take 30 mg by mouth as needed for cough.    ? gabapentin (NEURONTIN) 100 MG capsule TAKE 1 CAPSULE(100 MG) BY MOUTH THREE TIMES DAILY 90 capsule 3  ? glipiZIDE (GLUCOTROL XL) 10 MG 24 hr tablet TAKE 1 TABLET(10 MG) BY MOUTH DAILY WITH BREAKFAST 90 tablet 0  ? ipratropium-albuterol (DUONEB) 0.5-2.5 (3) MG/3ML SOLN USE 3 ML VIA NEBULIZER EVERY 6 HOURS AS NEEDED 360 mL 1  ? levothyroxine (SYNTHROID) 75 MCG tablet TAKE 1 TABLET BY MOUTH EVERY DAY BEFORE BREAKFAST 90 tablet 0  ? metFORMIN (GLUCOPHAGE) 500 MG tablet Take 1 tablet (500 mg total) by mouth 2 (two) times daily with a meal. 180 tablet 3  ? montelukast (SINGULAIR) 10 MG tablet TAKE 1 TABLET(10 MG) BY MOUTH AT BEDTIME 30 tablet 3   ? Multiple Vitamin (MULTIVITAMIN) capsule Take 1 capsule by mouth daily.    ? nystatin powder APPLY EXTERNALLY TO THE AFFECTED AREA TWICE DAILY 30 g 1  ? omeprazole (PRILOSEC) 20 MG capsule Take 20 mg by mouth daily.    ? predniSONE (DELTASONE) 5 MG tablet TAKE 1 TABLET(5 MG) BY MOUTH DAILY WITH BREAKFAST 90 tablet 0  ? ?No facility-administered medications prior to visit.  ? ? ?No Known Allergies ? ?ROS ?Review of Systems  ?Constitutional:  Positive for fatigue. Negative for chills and fever.  ?HENT:  Positive for congestion, postnasal drip, sinus pressure and sinus pain. Negative for ear pain and sore throat.   ?Respiratory:  Negative for cough, shortness of breath and wheezing.   ?Cardiovascular:  Negative for chest pain and palpitations.  ?Gastrointestinal:  Positive for nausea.  ?Hematological:   ?     Axillary lymph node tenderness ?bil  ? ?  ?Objective:  ?  ?Physical Exam ?Vitals reviewed.  ?Constitutional:   ?   General: She is not in acute distress. ?   Appearance: Normal appearance. She is normal weight. She is not ill-appearing, toxic-appearing or diaphoretic.  ?HENT:  ?   Right Ear: Tympanic membrane normal.  ?   Left Ear: Tympanic membrane normal.  ?   Nose:  ?   Right Sinus: Maxillary sinus tenderness and frontal sinus tenderness present.  ?   Left Sinus: Maxillary sinus tenderness and frontal sinus tenderness present.  ?   Mouth/Throat:  ?   Mouth: Mucous membranes are moist.  ?   Pharynx: Posterior oropharyngeal erythema present. No pharyngeal swelling.  ?   Tonsils: No tonsillar exudate. 0 on the right. 0 on the left.  ?Eyes:  ?   Extraocular Movements: Extraocular movements intact.  ?   Conjunctiva/sclera: Conjunctivae normal.  ?   Pupils: Pupils are equal, round, and reactive to light.  ?Neck:  ?   Thyroid: No thyroid mass.  ?Cardiovascular:  ?   Rate and Rhythm: Normal rate and regular rhythm.  ?Pulmonary:  ?   Effort: Pulmonary effort is normal.  ?   Breath sounds: Normal breath sounds.   ?Abdominal:  ?   General: Abdomen is flat.  ?   Tenderness: There is no abdominal tenderness.  ?Lymphadenopathy:  ?   Cervical:  ?   Right cervical: No superficial cervical adenopathy. ?   Left cervical: No superficial cervical adenopathy.  ?   Upper Body:  ?  Right upper body: Axillary adenopathy present.  ?   Left upper body: Axillary adenopathy present.  ?Skin: ?   Capillary Refill: Capillary refill takes less than 2 seconds.  ?Neurological:  ?   General: No focal deficit present.  ?   Mental Status: She is alert and oriented to person, place, and time.  ?Psychiatric:     ?   Mood and Affect: Mood normal.     ?   Behavior: Behavior normal.     ?   Thought Content: Thought content normal.     ?   Judgment: Judgment normal.  ? ? ?BP 118/70   Pulse 83   Temp 99.7 ?F (37.6 ?C)   Resp 16   Ht _0  (1.575 m)   Wt 187 lb 9 oz (85.1 kg)   SpO2 97%   BMI 34.31 kg/m?  ?Wt Readings from Last 3 Encounters:  ?02/21/22 187 lb 9 oz (85.1 kg)  ?01/04/22 186 lb (84.4 kg)  ?11/18/21 184 lb 3.2 oz (83.6 kg)  ? ? ? ?Health Maintenance Due  ?Topic Date Due  ? Zoster Vaccines- Shingrix (1 of 2) Never done  ? DEXA SCAN  Never done  ? COVID-19 Vaccine (4 - Booster for Pfizer series) 12/06/2020  ? ? ?There are no preventive care reminders to display for this patient. ? ?Lab Results  ?Component Value Date  ? TSH 1.81 01/04/2022  ? ?Lab Results  ?Component Value Date  ? WBC 9.4 07/16/2021  ? HGB 13.8 07/16/2021  ? HCT 41.2 07/16/2021  ? MCV 90.2 07/16/2021  ? PLT 345.0 07/16/2021  ? ?Lab Results  ?Component Value Date  ? NA 134 (L) 01/04/2022  ? K 4.8 01/04/2022  ? CO2 27 01/04/2022  ? GLUCOSE 257 (H) 01/04/2022  ? BUN 23 01/04/2022  ? CREATININE 0.98 01/04/2022  ? BILITOT 0.4 01/04/2022  ? ALKPHOS 66 01/04/2022  ? AST 17 01/04/2022  ? ALT 15 01/04/2022  ? PROT 6.9 01/04/2022  ? ALBUMIN 4.3 01/04/2022  ? CALCIUM 9.2 01/04/2022  ? ANIONGAP 11 09/03/2020  ? GFR 51.17 (L) 01/04/2022  ? ?Lab Results  ?Component Value Date  ? HGBA1C  8.0 (H) 01/04/2022  ? ? ?  ?Assessment & Plan:  ? ?Problem List Items Addressed This Visit   ? ?  ? Respiratory  ? Acute non-recurrent frontal sinusitis - Primary  ?  rx augmentin 875/125 mg  ?Take antibiotic as p

## 2022-02-21 NOTE — Patient Instructions (Signed)
Start prescription of augmentin 875/125 mg and take as prescribed.  ?Tylenol ok for sinus pain as needed ?Increase oral fluids. Ok to continue with humidifers and hot steamy showers as discussed during visit. ? ?Recommend mucinex without DM for the drainage.  ? ?It was a pleasure speaking with you today, I hope you start feeling better soon. ? ?Regards,  ? ?Artemisa Sladek ? ?

## 2022-02-22 DIAGNOSIS — J0110 Acute frontal sinusitis, unspecified: Secondary | ICD-10-CM | POA: Insufficient documentation

## 2022-02-22 NOTE — Assessment & Plan Note (Signed)
rx augmentin 875/125 mg  ?Take antibiotic as prescribed. Increase oral fluids. Pt to f/u if sx worsen and or fail to improve in 2-3 days. ? ?

## 2022-02-28 ENCOUNTER — Telehealth: Payer: Self-pay | Admitting: Podiatry

## 2022-02-28 ENCOUNTER — Other Ambulatory Visit: Payer: Self-pay | Admitting: Podiatry

## 2022-02-28 MED ORDER — GABAPENTIN 100 MG PO CAPS: 100.0000 mg | ORAL_CAPSULE | Freq: Three times a day (TID) | ORAL | 0 refills | Status: DC

## 2022-02-28 NOTE — Telephone Encounter (Addendum)
Patient called needs a 2 week refill on gabapentin (NEURONTIN) 100 MG capsule pharmacy will not refill since she has not been seen since 08/2019    Pharmacy is [facility] on [address]

## 2022-02-28 NOTE — Telephone Encounter (Signed)
Prescription sent.  Thanks, Dr. Amalia Hailey

## 2022-03-03 ENCOUNTER — Ambulatory Visit (INDEPENDENT_AMBULATORY_CARE_PROVIDER_SITE_OTHER)
Admission: RE | Admit: 2022-03-03 | Discharge: 2022-03-03 | Disposition: A | Discharge status: Home / Self Care | Payer: Medicare Other | Source: Ambulatory Visit | Attending: Family | Admitting: Family

## 2022-03-03 ENCOUNTER — Ambulatory Visit (INDEPENDENT_AMBULATORY_CARE_PROVIDER_SITE_OTHER): Payer: Medicare Other | Admitting: Family

## 2022-03-03 ENCOUNTER — Encounter: Payer: Self-pay | Admitting: Family

## 2022-03-03 VITALS — BP 118/78 | HR 91 | Temp 97.9°F | Resp 16 | Ht 62.0 in | Wt 187.6 lb

## 2022-03-03 DIAGNOSIS — R1032 Left lower quadrant pain: Secondary | ICD-10-CM

## 2022-03-03 DIAGNOSIS — R300 Dysuria: Secondary | ICD-10-CM | POA: Diagnosis not present

## 2022-03-03 DIAGNOSIS — R1011 Right upper quadrant pain: Secondary | ICD-10-CM | POA: Insufficient documentation

## 2022-03-03 DIAGNOSIS — R0609 Other forms of dyspnea: Secondary | ICD-10-CM | POA: Insufficient documentation

## 2022-03-03 DIAGNOSIS — R5383 Other fatigue: Secondary | ICD-10-CM | POA: Insufficient documentation

## 2022-03-03 DIAGNOSIS — R0602 Shortness of breath: Secondary | ICD-10-CM | POA: Diagnosis not present

## 2022-03-03 DIAGNOSIS — R109 Unspecified abdominal pain: Secondary | ICD-10-CM | POA: Diagnosis not present

## 2022-03-03 LAB — POC URINALSYSI DIPSTICK (AUTOMATED)
Bilirubin, UA: NEGATIVE
Blood, UA: NEGATIVE
Glucose, UA: NEGATIVE
Ketones, UA: NEGATIVE
Nitrite, UA: NEGATIVE
Protein, UA: NEGATIVE
Spec Grav, UA: 1.02 (ref 1.010–1.025)
Urobilinogen, UA: 0.2 E.U./dL
pH, UA: 5 (ref 5.0–8.0)

## 2022-03-03 LAB — COMPREHENSIVE METABOLIC PANEL
ALT: 18 U/L (ref 0–35)
AST: 19 U/L (ref 0–37)
Albumin: 4.5 g/dL (ref 3.5–5.2)
Alkaline Phosphatase: 64 U/L (ref 39–117)
BUN: 17 mg/dL (ref 6–23)
CO2: 26 mEq/L (ref 19–32)
Calcium: 9.4 mg/dL (ref 8.4–10.5)
Chloride: 98 mEq/L (ref 96–112)
Creatinine, Ser: 0.84 mg/dL (ref 0.40–1.20)
GFR: 61.5 mL/min (ref >60.00)
Glucose, Bld: 156 mg/dL — ABNORMAL HIGH (ref 70–99)
Potassium: 4.5 mEq/L (ref 3.5–5.1)
Sodium: 134 mEq/L — ABNORMAL LOW (ref 135–145)
Total Bilirubin: 0.4 mg/dL (ref 0.2–1.2)
Total Protein: 7.1 g/dL (ref 6.0–8.3)

## 2022-03-03 LAB — CBC WITH DIFFERENTIAL/PLATELET
Basophils Absolute: 0.1 10*3/uL (ref 0.0–0.1)
Basophils Relative: 0.6 % (ref 0.0–3.0)
Eosinophils Absolute: 0 10*3/uL (ref 0.0–0.7)
Eosinophils Relative: 0.4 % (ref 0.0–5.0)
HCT: 42.2 % (ref 36.0–46.0)
Hemoglobin: 13.9 g/dL (ref 12.0–15.0)
Lymphocytes Relative: 13.1 % (ref 12.0–46.0)
Lymphs Abs: 1.5 10*3/uL (ref 0.7–4.0)
MCHC: 33 g/dL (ref 30.0–36.0)
MCV: 91.8 fl (ref 78.0–100.0)
Monocytes Absolute: 0.4 10*3/uL (ref 0.1–1.0)
Monocytes Relative: 4 % (ref 3.0–12.0)
Neutro Abs: 9.2 10*3/uL — ABNORMAL HIGH (ref 1.4–7.7)
Neutrophils Relative %: 81.9 % — ABNORMAL HIGH (ref 43.0–77.0)
Platelets: 374 10*3/uL (ref 150.0–400.0)
RBC: 4.6 Mil/uL (ref 3.87–5.11)
RDW: 14 % (ref 11.5–15.5)
WBC: 11.2 10*3/uL — ABNORMAL HIGH (ref 4.0–10.5)

## 2022-03-03 LAB — AMYLASE: Amylase: 37 U/L (ref 27–131)

## 2022-03-03 LAB — LIPASE: Lipase: 14 U/L (ref 11.0–59.0)

## 2022-03-03 LAB — C-REACTIVE PROTEIN: CRP: <1 mg/dL (ref 0.5–20.0)

## 2022-03-03 LAB — BRAIN NATRIURETIC PEPTIDE: Pro B Natriuretic peptide (BNP): 35 pg/mL (ref 0.0–100.0)

## 2022-03-03 NOTE — Progress Notes (Signed)
? ?Established Patient Office Visit ? ?Subjective:  ?Patient ID: Megan Bradshaw, female    DOB: 1930-05-04  Age: 88 y.o. MRN: [id number] ? ?CC:  ?Chief Complaint  ?Patient presents with  ? Urinary Tract Infection  ?  Burning in front and back  ? ? ?HPI ?Shahira Fiske is here today with concerns.  ?Seen 4/10 for sinusitis was given augmentin and she states only mild improvement in sinus pressure, however now stomach with discomfort and gassy with diarrhea. She has lower abdomen tenderness and some dysuria with peeing.  ? ?She is still experiencing some sob but this is normal for her except for increasing DOE.  ?She does have asthma and copd and thinks it is exacerbated.  ? ?Also feels abdominal bloating and some lower abdominal pain.  ? ?Past Medical History:  ?Diagnosis Date  ? Arthritis   ? Asthma   ? COPD (chronic obstructive pulmonary disease) ([place])   ? Diabetes ([place])   ? Thyroid disease   ? ? ?Past Surgical History:  ?Procedure Laterality Date  ? ABDOMINAL HYSTERECTOMY  1984  ? benign, TAH.  ? APPENDECTOMY  1939   ? CATARACT EXTRACTION, BILATERAL    ? CHOLECYSTECTOMY  1987  ? NASAL SINUS SURGERY  1990  ? REPLACEMENT TOTAL KNEE Right 2007  ? ? ?Family History  ?Problem Relation Age of Onset  ? Arthritis Father   ? Diabetes Sister   ? Diabetes Daughter   ? Cancer Neg Hx   ? Heart disease Neg Hx   ? Stroke Neg Hx   ? ? ?Social History  ? ?Socioeconomic History  ? Marital status: Widowed  ?  Spouse name: Not on file  ? Number of children: 4  ? Years of education: Not on file  ? Highest education level: Not on file  ?Occupational History  ? Occupation: Retired  ?Tobacco Use  ? Smoking status: Former  ?  Packs/day: 1.50  ?  Years: 30.00  ?  Pack years: 45.00  ?  Types: Cigarettes  ?  Quit date: 11/15/1987  ?  Years since quitting: 34.3  ? Smokeless tobacco: Never  ?Vaping Use  ? Vaping Use: Never used  ?Substance and Sexual Activity  ? Alcohol use: No  ?  Alcohol/week: 0.0 standard drinks  ? Drug use: No  ? Sexual  activity: Not on file  ?Other Topics Concern  ? Not on file  ?Social History Narrative  ? Not on file  ? ?Social Determinants of Health  ? ?Financial Resource Strain: Not on file  ?Food Insecurity: Not on file  ?Transportation Needs: Not on file  ?Physical Activity: Not on file  ?Stress: Not on file  ?Social Connections: Not on file  ?Intimate Partner Violence: Not on file  ? ? ?Outpatient Medications Prior to Visit  ?Medication Sig Dispense Refill  ? ACCU-CHEK GUIDE test strip TEST TWICE DAILY 200 strip 1  ? Accu-Chek Softclix Lancets lancets as directed.    ? albuterol (PROVENTIL) (2.5 MG/3ML) 0.083% nebulizer solution Take 3 mLs (2.5 mg total) by nebulization every 4 (four) hours as needed for wheezing or shortness of breath. 150 mL 5  ? albuterol (VENTOLIN HFA) 108 (90 Base) MCG/ACT inhaler INHALE 1 TO 2 PUFFS BY MOUTH EVERY 6 HOURS FOR PAIN OR WHEEZING OR SHORTNESS OF BREATH 18 g 2  ? B Complex-Folic Acid (BENFOTIAMINE MULTI-B) CAPS Take 1 capsule by mouth daily. 30 capsule 6  ? Blood Glucose Monitoring Suppl (ACCU-CHEK GUIDE ME) w/Device KIT See admin instructions.    ?  Cholecalciferol (VITAMIN D PO) Take 1 tablet by mouth daily.    ? clobetasol cream (TEMOVATE) 1.49 % Apply 1 application topically daily as needed. 30 g 1  ? dextromethorphan (DELSYM) 30 MG/5ML liquid Take 30 mg by mouth as needed for cough.    ? gabapentin (NEURONTIN) 100 MG capsule Take 1 capsule (100 mg total) by mouth 3 (three) times daily. 90 capsule 0  ? glipiZIDE (GLUCOTROL XL) 10 MG 24 hr tablet TAKE 1 TABLET(10 MG) BY MOUTH DAILY WITH BREAKFAST 90 tablet 0  ? ipratropium-albuterol (DUONEB) 0.5-2.5 (3) MG/3ML SOLN USE 3 ML VIA NEBULIZER EVERY 6 HOURS AS NEEDED 360 mL 1  ? levothyroxine (SYNTHROID) 75 MCG tablet TAKE 1 TABLET BY MOUTH EVERY DAY BEFORE BREAKFAST 90 tablet 0  ? metFORMIN (GLUCOPHAGE) 500 MG tablet Take 1 tablet (500 mg total) by mouth 2 (two) times daily with a meal. 180 tablet 3  ? montelukast (SINGULAIR) 10 MG tablet  TAKE 1 TABLET(10 MG) BY MOUTH AT BEDTIME 30 tablet 3  ? Multiple Vitamin (MULTIVITAMIN) capsule Take 1 capsule by mouth daily.    ? nystatin powder APPLY EXTERNALLY TO THE AFFECTED AREA TWICE DAILY 30 g 1  ? omeprazole (PRILOSEC) 20 MG capsule Take 20 mg by mouth daily.    ? predniSONE (DELTASONE) 5 MG tablet TAKE 1 TABLET(5 MG) BY MOUTH DAILY WITH BREAKFAST 90 tablet 0  ? amoxicillin-clavulanate (AUGMENTIN) 875-125 MG tablet Take 1 tablet by mouth 2 (two) times daily. (Patient not taking: Reported on 03/03/2022) 20 tablet 0  ? ?No facility-administered medications prior to visit.  ? ? ?Allergies  ?Allergen Reactions  ? Augmentin [Amoxicillin-Pot Clavulanate] Diarrhea  ? ? ?ROS ?Review of Systems  ?Constitutional:  Negative for chills and fever.  ?HENT:  Positive for sinus pressure (improving). Negative for congestion, ear pain and sore throat.   ?Respiratory:  Positive for shortness of breath (with increasing doe). Negative for cough, chest tightness and wheezing.   ?Cardiovascular:  Positive for leg swelling (slight). Negative for chest pain and palpitations.  ?Gastrointestinal:  Positive for abdominal distention, abdominal pain and diarrhea (slight). Negative for blood in stool, nausea and vomiting.  ?Genitourinary:  Positive for dysuria, frequency and pelvic pain. Negative for decreased urine volume, difficulty urinating, flank pain and urgency.  ? ?  ?Objective:  ?  ?Physical Exam ?Constitutional:   ?   General: She is not in acute distress. ?   Appearance: She is obese. She is not ill-appearing, toxic-appearing or diaphoretic.  ?HENT:  ?   Head: Normocephalic.  ?   Right Ear: Tympanic membrane normal.  ?   Nose: Nose normal.  ?   Mouth/Throat:  ?   Mouth: Mucous membranes are moist.  ?Eyes:  ?   Pupils: Pupils are equal, round, and reactive to light.  ?Cardiovascular:  ?   Rate and Rhythm: Normal rate and regular rhythm.  ?Pulmonary:  ?   Effort: Pulmonary effort is normal.  ?Abdominal:  ?   General: Abdomen is  flat. Bowel sounds are normal.  ?   Tenderness: There is abdominal tenderness in the right upper quadrant, suprapubic area and left lower quadrant.  ?   Comments: Very tender on llq pt with tears in eyes   ?Musculoskeletal:  ?   Right lower leg: 1+ Edema present.  ?   Left lower leg: Edema present.  ?Skin: ?   General: Skin is warm.  ?Neurological:  ?   General: No focal deficit present.  ?  Mental Status: She is alert.  ?Psychiatric:     ?   Mood and Affect: Mood normal.     ?   Behavior: Behavior normal.     ?   Thought Content: Thought content normal.     ?   Judgment: Judgment normal.  ? ? ?BP 118/78   Pulse 91   Temp 97.9 ?F (36.6 ?C)   Resp 16   Ht '5\' 2"'  (1.575 m)   Wt 187 lb 9 oz (85.1 kg)   SpO2 98%   BMI 34.31 kg/m?  ?Wt Readings from Last 3 Encounters:  ?03/03/22 187 lb 9 oz (85.1 kg)  ?02/21/22 187 lb 9 oz (85.1 kg)  ?01/04/22 186 lb (84.4 kg)  ? ? ? ?Health Maintenance Due  ?Topic Date Due  ? Zoster Vaccines- Shingrix (1 of 2) Never done  ? DEXA SCAN  Never done  ? COVID-19 Vaccine (4 - Booster for Pfizer series) 12/06/2020  ? ? ?There are no preventive care reminders to display for this patient. ? ?Lab Results  ?Component Value Date  ? TSH 1.81 01/04/2022  ? ?Lab Results  ?Component Value Date  ? WBC 9.4 07/16/2021  ? HGB 13.8 07/16/2021  ? HCT 41.2 07/16/2021  ? MCV 90.2 07/16/2021  ? PLT 345.0 07/16/2021  ? ?Lab Results  ?Component Value Date  ? NA 134 (L) 01/04/2022  ? K 4.8 01/04/2022  ? CO2 27 01/04/2022  ? GLUCOSE 257 (H) 01/04/2022  ? BUN 23 01/04/2022  ? CREATININE 0.98 01/04/2022  ? BILITOT 0.4 01/04/2022  ? ALKPHOS 66 01/04/2022  ? AST 17 01/04/2022  ? ALT 15 01/04/2022  ? PROT 6.9 01/04/2022  ? ALBUMIN 4.3 01/04/2022  ? CALCIUM 9.2 01/04/2022  ? ANIONGAP 11 09/03/2020  ? GFR 51.17 (L) 01/04/2022  ? ?Lab Results  ?Component Value Date  ? HGBA1C 8.0 (H) 01/04/2022  ? ? ?  ?Assessment & Plan:  ? ?Problem List Items Addressed This Visit   ? ?  ? Other  ? DOE (dyspnea on exertion)  ?   Ordering BNP , rule out CHF ?Cbc bmp pending results ?Ordering CXR today stat ? ?  ?  ? Relevant Orders  ? CBC with Differential  ? Brain natriuretic peptide  ? CT Abdomen Pelvis W Contrast  ? DG Chest 2 View (Comple

## 2022-03-03 NOTE — Progress Notes (Signed)
No acute findings that are concerning.  ?Will await lab work and urine culture

## 2022-03-03 NOTE — Assessment & Plan Note (Addendum)
Moderate left lower quadrant pain did advise patient that she should have stat CT however she declines.  Advised patient due to this extensive pain she should likely also be seen in the ER however she refuses this as well.  Considering she does not want to go to the hospital for anything I can also not order an ultrasound so I am resorting to a KUB x-ray to see if there is any obstruction.  I advised her that this is not the preferred however I will order.  Stressed to patient that if she has any worsening pain, nausea, fever, worsening shortness of breath she needs to immediately go to the emergency room and/or call 911.  She is agreeable ? ?Also ordering CBC to see if any leukocytosis ?Have ordered a urine culture as well as POCT dipstick in office ?

## 2022-03-03 NOTE — Patient Instructions (Signed)
Complete xray(s) prior to leaving today. I will notify you of your results once received. ?Stop by the lab prior to leaving today. I will notify you of your results once received.  ? ?Due to recent changes in healthcare laws, you may see results of your imaging and/or laboratory studies on MyChart before I have had a chance to review them.  I understand that in some cases there may be results that are confusing or concerning to you. Please understand that not all results are received at the same time and often I may need to interpret multiple results in order to provide you with the best plan of care or course of treatment. Therefore, I ask that you please give me 2 business days to thoroughly review all your results before contacting my office for clarification. Should we see a critical lab result, you will be contacted sooner.  ? ?It was a pleasure seeing you today! Please do not hesitate to reach out with any questions and or concerns. ? ?Regards,  ? ?Ahmarion Saraceno ?FNP-C ? ?

## 2022-03-03 NOTE — Assessment & Plan Note (Signed)
Ordering CMP CBC amylase and lipase to rule out other concerns to include cholecystitis, liver concerns, or obstruction ?

## 2022-03-03 NOTE — Assessment & Plan Note (Signed)
Urine culture and poct  ?Ordered and pending ? ?

## 2022-03-03 NOTE — Progress Notes (Signed)
Already sent note, this is just for note in chart.  ?No acute findings that are concerning.  ?Will await lab work and urine culture.  ?Could be tenderness from augmentin.

## 2022-03-03 NOTE — Assessment & Plan Note (Addendum)
Ordering BNP , rule out CHF ?Cbc bmp pending results ?Ordering CXR today stat ?

## 2022-03-04 ENCOUNTER — Telehealth: Payer: Self-pay

## 2022-03-04 LAB — URINE CULTURE
MICRO NUMBER:: 13290482
Result:: NO GROWTH
SPECIMEN QUALITY:: ADEQUATE

## 2022-03-04 NOTE — Telephone Encounter (Signed)
Patient is calling to see if her results are in. Please advise

## 2022-03-07 ENCOUNTER — Other Ambulatory Visit: Payer: Self-pay | Admitting: Family

## 2022-03-07 DIAGNOSIS — J0111 Acute recurrent frontal sinusitis: Secondary | ICD-10-CM

## 2022-03-07 MED ORDER — DOXYCYCLINE HYCLATE 100 MG PO TABS
100.0000 mg | ORAL_TABLET | Freq: Two times a day (BID) | ORAL | 0 refills | Status: AC
Start: 2022-03-07 — End: 2022-03-17

## 2022-03-07 NOTE — Telephone Encounter (Signed)
Another message is sent to MA to call pt with results.  ? ?

## 2022-03-07 NOTE — Progress Notes (Signed)
White blood cells a bit elevated, pt may have some infection and with sob and ongoing sinus pain I will send in RX for doxycycline. Ask pt how the weekend was? Is she feeling any better?  ? ?Urine came back negative for infection.  ?Sodium is low but stable.  ? ?Other labs ordered not concerning. Test for CHF negative.  ?Medication sent to [facility]

## 2022-03-08 ENCOUNTER — Telehealth: Payer: Self-pay

## 2022-03-08 ENCOUNTER — Encounter: Payer: Self-pay | Admitting: Family Medicine

## 2022-03-08 ENCOUNTER — Ambulatory Visit (INDEPENDENT_AMBULATORY_CARE_PROVIDER_SITE_OTHER): Payer: Medicare Other | Admitting: Family Medicine

## 2022-03-08 ENCOUNTER — Other Ambulatory Visit: Payer: Self-pay | Admitting: Family Medicine

## 2022-03-08 VITALS — BP 116/68 | HR 78 | Temp 97.7°F | Resp 16 | Ht 62.0 in

## 2022-03-08 DIAGNOSIS — D72829 Elevated white blood cell count, unspecified: Secondary | ICD-10-CM | POA: Diagnosis not present

## 2022-03-08 DIAGNOSIS — R0982 Postnasal drip: Secondary | ICD-10-CM | POA: Diagnosis not present

## 2022-03-08 DIAGNOSIS — R0789 Other chest pain: Secondary | ICD-10-CM

## 2022-03-08 LAB — CBC WITH DIFFERENTIAL/PLATELET
Basophils Absolute: 0.1 10*3/uL (ref 0.0–0.1)
Basophils Relative: 0.7 % (ref 0.0–3.0)
Eosinophils Absolute: 0.1 10*3/uL (ref 0.0–0.7)
Eosinophils Relative: 0.7 % (ref 0.0–5.0)
HCT: 42.1 % (ref 36.0–46.0)
Hemoglobin: 14.1 g/dL (ref 12.0–15.0)
Lymphocytes Relative: 15.4 % (ref 12.0–46.0)
Lymphs Abs: 1.7 10*3/uL (ref 0.7–4.0)
MCHC: 33.4 g/dL (ref 30.0–36.0)
MCV: 90.9 fl (ref 78.0–100.0)
Monocytes Absolute: 0.5 10*3/uL (ref 0.1–1.0)
Monocytes Relative: 4.7 % (ref 3.0–12.0)
Neutro Abs: 8.9 10*3/uL — ABNORMAL HIGH (ref 1.4–7.7)
Neutrophils Relative %: 78.5 % — ABNORMAL HIGH (ref 43.0–77.0)
Platelets: 362 10*3/uL (ref 150.0–400.0)
RBC: 4.63 Mil/uL (ref 3.87–5.11)
RDW: 14 % (ref 11.5–15.5)
WBC: 11.3 10*3/uL — ABNORMAL HIGH (ref 4.0–10.5)

## 2022-03-08 MED ORDER — PREDNISONE 20 MG PO TABS: ORAL_TABLET | ORAL | 0 refills | Status: DC

## 2022-03-08 NOTE — Telephone Encounter (Signed)
Called patient reviewed all information and repeated back to me. Will call if any questions.  Made appt on 5/5 @ 9:00 with [facility] ?

## 2022-03-08 NOTE — Patient Instructions (Addendum)
Please stop at the lab to have labs drawn.  

## 2022-03-08 NOTE — Progress Notes (Signed)
? ? Patient ID: Megan Bradshaw, female    DOB: 1929/12/19, 88 y.o.   MRN: [id number] ? ?This visit was conducted in person. ? ?BP 116/68   Pulse 78   Temp 97.7 ?F (36.5 ?C)   Resp 16   Ht _0  (1.575 m)   SpO2 98%   BMI 34.31 kg/m?   ? ?CC:  ?Chief Complaint  ?Patient presents with  ? Abdominal Pain  ? Nasal Congestion  ? ? ?Subjective:  ? ?HPI: ?Megan Bradshaw is a 88 y.o. female presenting on 03/08/2022 for Abdominal Pain and Nasal Congestion ? ?This patient was seen recently on 03/03/2022 by Georgina Peer, FNP. ?Note was reviewed in detail ?She had been seen previously on 4/10/2 2023 for sinusitis and was given Augmentin. ?The medication had caused stomach discomfort with gas and diarrhea ?He also had new onset lower abdominal tenderness with some dysuria ?CBC showed WBCs at 11.2, no anemia ?BNP normal at 35, normal lipase and amylase ?Sodium at 134 and glucose ( nonfasting) at 156, normal AST and ALT and creatinine at 0.84 ?Urine culture was negative ?KUB was unremarkable, chest x-ray was clear. ? ?Given continued sinus pressure a course of doxycycline 1 tablet twice daily for 10 days was sent in on 03/07/2022. ? ?  She has been feeling cold inside. ?She is having post nasal drip.. causes some nausea. ? No sinus pain, no ear pain. ? Decreased appetite. Glucose 94. ? Sore in sides of breast . ? No fever. ? No  worsened SOB. ? ?She is more tired than usual. ? She has firm BM followed by soft BM. Yesterday diarrhea. ? No abdominal pain. No dysuria. ? ? She is drinking a lot of water. ? ? ? ? ?Relevant past medical, surgical, family and social history reviewed and updated as indicated. Interim medical history since our last visit reviewed. ?Allergies and medications reviewed and updated. ?Outpatient Medications Prior to Visit  ?Medication Sig Dispense Refill  ? ACCU-CHEK GUIDE test strip TEST TWICE DAILY 200 strip 1  ? Accu-Chek Softclix Lancets lancets as directed.    ? albuterol (PROVENTIL) (2.5 MG/3ML)  0.083% nebulizer solution Take 3 mLs (2.5 mg total) by nebulization every 4 (four) hours as needed for wheezing or shortness of breath. 150 mL 5  ? albuterol (VENTOLIN HFA) 108 (90 Base) MCG/ACT inhaler INHALE 1 TO 2 PUFFS BY MOUTH EVERY 6 HOURS FOR PAIN OR WHEEZING OR SHORTNESS OF BREATH 18 g 2  ? B Complex-Folic Acid (BENFOTIAMINE MULTI-B) CAPS Take 1 capsule by mouth daily. 30 capsule 6  ? Blood Glucose Monitoring Suppl (ACCU-CHEK GUIDE ME) w/Device KIT See admin instructions.    ? Cholecalciferol (VITAMIN D PO) Take 1 tablet by mouth daily.    ? clobetasol cream (TEMOVATE) 7.06 % Apply 1 application topically daily as needed. 30 g 1  ? dextromethorphan (DELSYM) 30 MG/5ML liquid Take 30 mg by mouth as needed for cough.    ? gabapentin (NEURONTIN) 100 MG capsule Take 1 capsule (100 mg total) by mouth 3 (three) times daily. 90 capsule 0  ? glipiZIDE (GLUCOTROL XL) 10 MG 24 hr tablet TAKE 1 TABLET(10 MG) BY MOUTH DAILY WITH BREAKFAST 90 tablet 0  ? ipratropium-albuterol (DUONEB) 0.5-2.5 (3) MG/3ML SOLN USE 3 ML VIA NEBULIZER EVERY 6 HOURS AS NEEDED 360 mL 1  ? levothyroxine (SYNTHROID) 75 MCG tablet TAKE 1 TABLET BY MOUTH EVERY DAY BEFORE BREAKFAST 90 tablet 0  ? metFORMIN (GLUCOPHAGE) 500 MG tablet Take 1 tablet (500 mg  total) by mouth 2 (two) times daily with a meal. 180 tablet 3  ? montelukast (SINGULAIR) 10 MG tablet TAKE 1 TABLET(10 MG) BY MOUTH AT BEDTIME 30 tablet 3  ? Multiple Vitamin (MULTIVITAMIN) capsule Take 1 capsule by mouth daily.    ? nystatin powder APPLY EXTERNALLY TO THE AFFECTED AREA TWICE DAILY 30 g 1  ? omeprazole (PRILOSEC) 20 MG capsule Take 20 mg by mouth daily.    ? predniSONE (DELTASONE) 5 MG tablet TAKE 1 TABLET(5 MG) BY MOUTH DAILY WITH BREAKFAST 90 tablet 0  ? doxycycline (VIBRA-TABS) 100 MG tablet Take 1 tablet (100 mg total) by mouth 2 (two) times daily for 10 days. (Patient not taking: Reported on 03/08/2022) 20 tablet 0  ? ?No facility-administered medications prior to visit.  ?   ? ?Per HPI unless specifically indicated in ROS section below ?Review of Systems  ?Constitutional:  Positive for chills. Negative for fatigue and fever.  ?HENT:  Negative for congestion.   ?Eyes:  Negative for pain.  ?Respiratory:  Negative for cough and shortness of breath.   ?Cardiovascular:  Negative for chest pain, palpitations and leg swelling.  ?Gastrointestinal:  Negative for abdominal pain.  ?Genitourinary:  Negative for dysuria and vaginal bleeding.  ?Musculoskeletal:  Negative for back pain.  ?Neurological:  Negative for syncope, light-headedness and headaches.  ?Psychiatric/Behavioral:  Negative for dysphoric mood.   ?Objective:  ?BP 116/68   Pulse 78   Temp 97.7 ?F (36.5 ?C)   Resp 16   Ht _0  (1.575 m)   SpO2 98%   BMI 34.31 kg/m?   ?Wt Readings from Last 3 Encounters:  ?03/03/22 187 lb 9 oz (85.1 kg)  ?02/21/22 187 lb 9 oz (85.1 kg)  ?01/04/22 186 lb (84.4 kg)  ?  ?  ?Physical Exam ?Constitutional:   ?   General: She is not in acute distress. ?   Appearance: Normal appearance. She is well-developed. She is obese. She is not ill-appearing or toxic-appearing.  ?HENT:  ?   Head: Normocephalic.  ?   Right Ear: Hearing, tympanic membrane, ear canal and external ear normal. Tympanic membrane is not erythematous, retracted or bulging.  ?   Left Ear: Hearing, tympanic membrane, ear canal and external ear normal. Tympanic membrane is not erythematous, retracted or bulging.  ?   Nose: No mucosal edema or rhinorrhea.  ?   Right Sinus: No maxillary sinus tenderness or frontal sinus tenderness.  ?   Left Sinus: No maxillary sinus tenderness or frontal sinus tenderness.  ?   Mouth/Throat:  ?   Pharynx: Uvula midline.  ?Eyes:  ?   General: Lids are normal. Lids are everted, no foreign bodies appreciated.  ?   Conjunctiva/sclera: Conjunctivae normal.  ?   Pupils: Pupils are equal, round, and reactive to light.  ?Neck:  ?   Thyroid: No thyroid mass or thyromegaly.  ?   Vascular: No carotid bruit.  ?   Trachea:  Trachea normal.  ?Cardiovascular:  ?   Rate and Rhythm: Normal rate and regular rhythm.  ?   Pulses: Normal pulses.  ?   Heart sounds: Normal heart sounds, S1 normal and S2 normal. No murmur heard. ?  No friction rub. No gallop.  ?Pulmonary:  ?   Effort: Pulmonary effort is normal. No tachypnea or respiratory distress.  ?   Breath sounds: Normal breath sounds. No decreased breath sounds, wheezing, rhonchi or rales.  ?Abdominal:  ?   General: Bowel sounds are normal.  ?  Palpations: Abdomen is soft.  ?   Tenderness: There is no abdominal tenderness.  ?Musculoskeletal:  ?   Cervical back: Normal range of motion and neck supple.  ?Skin: ?   General: Skin is warm and dry.  ?   Findings: No rash.  ?Neurological:  ?   Mental Status: She is alert.  ?Psychiatric:     ?   Mood and Affect: Mood is not anxious or depressed.     ?   Speech: Speech normal.     ?   Behavior: Behavior normal. Behavior is cooperative.     ?   Thought Content: Thought content normal.     ?   Judgment: Judgment normal.  ? ?   ?Results for orders placed or performed in visit on 03/03/22  ?Urine Culture  ? Specimen: Urine  ?Result Value Ref Range  ? MICRO NUMBER: [id number]   ? SPECIMEN QUALITY: Adequate   ? Sample Source URINE   ? STATUS: FINAL   ? Result: No Growth   ?CBC with Differential  ?Result Value Ref Range  ? WBC 11.2 (H) 4.0 - 10.5 K/uL  ? RBC 4.60 3.87 - 5.11 Mil/uL  ? Hemoglobin 13.9 12.0 - 15.0 g/dL  ? HCT 42.2 36.0 - 46.0 %  ? MCV 91.8 78.0 - 100.0 fl  ? MCHC 33.0 30.0 - 36.0 g/dL  ? RDW 14.0 11.5 - 15.5 %  ? Platelets 374.0 150.0 - 400.0 K/uL  ? Neutrophils Relative % 81.9 (H) 43.0 - 77.0 %  ? Lymphocytes Relative 13.1 12.0 - 46.0 %  ? Monocytes Relative 4.0 3.0 - 12.0 %  ? Eosinophils Relative 0.4 0.0 - 5.0 %  ? Basophils Relative 0.6 0.0 - 3.0 %  ? Neutro Abs 9.2 (H) 1.4 - 7.7 K/uL  ? Lymphs Abs 1.5 0.7 - 4.0 K/uL  ? Monocytes Absolute 0.4 0.1 - 1.0 K/uL  ? Eosinophils Absolute 0.0 0.0 - 0.7 K/uL  ? Basophils Absolute 0.1 0.0 - 0.1  K/uL  ?Brain natriuretic peptide  ?Result Value Ref Range  ? Pro B Natriuretic peptide (BNP) 35.0 0.0 - 100.0 pg/mL  ?C-reactive protein  ?Result Value Ref Range  ? CRP <1.0 0.5 - 20.0 mg/dL  ?Amylase  ?Result Value Ref Ran

## 2022-03-08 NOTE — Progress Notes (Signed)
Ok thank you 

## 2022-03-11 ENCOUNTER — Ambulatory Visit (INDEPENDENT_AMBULATORY_CARE_PROVIDER_SITE_OTHER): Payer: Medicare Other | Admitting: Podiatry

## 2022-03-11 DIAGNOSIS — E0843 Diabetes mellitus due to underlying condition with diabetic autonomic (poly)neuropathy: Secondary | ICD-10-CM | POA: Diagnosis not present

## 2022-03-11 MED ORDER — GABAPENTIN 100 MG PO CAPS: 100.0000 mg | ORAL_CAPSULE | Freq: Three times a day (TID) | ORAL | 3 refills | Status: DC

## 2022-03-11 NOTE — Progress Notes (Signed)
? ?  HPI: 88 y.o. female with PMHx of type II diabetes mellitus presenting today with a chief complaint of bilateral foot pain.  Patient states that the gabapentin that was prescribed helped significantly.  She experiences burning and tingling and numbness sensations to the bilateral feet.  No new complaints at this time ? ?Past Medical History:  ?Diagnosis Date  ? Arthritis   ? Asthma   ? COPD (chronic obstructive pulmonary disease) ([place])   ? Diabetes ([place])   ? Thyroid disease   ? ?  ?Physical Exam: ?General: The patient is alert and oriented x3 in no acute distress. ? ?Dermatology: Skin is warm, dry and supple bilateral lower extremities. Negative for open lesions or macerations. ? ?Vascular: Palpable pedal pulses bilaterally. No edema or erythema noted. Capillary refill within normal limits. ? ?Neurological: Epicritic and protective threshold diminished bilaterally.  ? ?Musculoskeletal Exam: Range of motion within normal limits to all pedal and ankle joints bilateral. Muscle strength 5/5 in all groups bilateral.  ? ?Radiographic Exam:  ?Normal osseous mineralization. Joint spaces preserved. No fracture/dislocation/boney destruction.   ? ?Assessment: ?1. DM with peripheral polyneuropathy  ? ? ?Plan of Care:  ?1. Patient evaluated. X-Rays reviewed.  ?2.  Refill prescription for Gabapentin 100 mg TID provided to patient.  ?3. Recommended good shoe gear.  ?4. Return to clinic as needed.  ? ?  ?  ?Edrick Kins, DPM ?[place] ? ?Dr. Edrick Kins, DPM  ?  ?[address]                                        ?[place], [place] [address]                ?Office [phone]  ?Fax [phone] ? ? ? ? ?

## 2022-03-18 ENCOUNTER — Ambulatory Visit (INDEPENDENT_AMBULATORY_CARE_PROVIDER_SITE_OTHER): Payer: Medicare Other | Admitting: Family Medicine

## 2022-03-18 VITALS — BP 140/76 | HR 88 | Temp 98.2°F | Ht 62.0 in | Wt 185.3 lb

## 2022-03-18 DIAGNOSIS — N3281 Overactive bladder: Secondary | ICD-10-CM | POA: Diagnosis not present

## 2022-03-18 MED ORDER — LEVOTHYROXINE SODIUM 75 MCG PO TABS: 75.0000 ug | ORAL_TABLET | Freq: Every day | ORAL | 3 refills | Status: DC

## 2022-03-18 MED ORDER — MIRABEGRON ER 25 MG PO TB24: 25.0000 mg | ORAL_TABLET | Freq: Every day | ORAL | 11 refills | Status: DC

## 2022-03-18 NOTE — Assessment & Plan Note (Signed)
Chronic, recurrent issues ? ?Per notes from her previous PCP she had trouble tolerating anticholinergics.  These are also not great choices in the setting of using gabapentin given increased sedation and fall risk. ?We will try a trial of Myrbetriq 25 mg daily.  I discussed with the patient that this will take several weeks to a month to start working.  If she does not noted improvement at that time she can call and we can increase the dose to 50 mcg. ?

## 2022-03-18 NOTE — Patient Instructions (Signed)
Start mirbegron for over active bladder. ?Try timed bathroom visit. No liquids after 7 PM... drink all liquids early in day. ?

## 2022-03-18 NOTE — Progress Notes (Signed)
? ? Patient ID: Megan Bradshaw, female    DOB: 15-Apr-1930, 88 y.o.   MRN: [id number] ? ?This visit was conducted in person. ? ?BP 140/76   Bradshaw 89   Temp 98.2 ?F (36.8 ?C) (Oral)   Ht 5' 2" (1.575 m)   Wt 185 lb 5 oz (84.1 kg)   SpO2 96%   BMI 33.89 kg/m?   ? ?CC:  ?Chief Complaint  ?Patient presents with  ? Follow-up  ?  From 03/08/22 visit  ? Trouble sleeping  ? ? ?Subjective:  ? ?HPI: ?Megan Bradshaw is a 88 y.o. female presenting on 03/18/2022 for Follow-up (From 03/08/22 visit) and Trouble sleeping ? ?At last office visit on 03/08/2022 we discussed that her abdominal pain had improved and that there was no clear sign of abdominal infection.  I felt her symptoms may have been secondary to her Augmentin course. ?At that visit her main symptoms were upper respiratory. ?Given her white blood cell count was still elevated on CBC I encouraged her to complete a 10-day course of doxycycline  and prednisone increase for possible COPD exacerbation. ?(Of note she is on chronic low-dose prednisone, but her white blood cells are not typically elevated on this low-dose prednisone.) ? ?Today she reports resolved abdominal pain. ? She has had improvement in upper respiratory symptoms but remains sore over upper chest. (Had normal breast exam) She wonders if it is her bra) ?She has follow up appt with pulmonary last week. ? ? ? She is now having issues falling asleep.Marland Kitchen ongoing x 4-5 months. ?  Falling asleep okay with gabapentin but wakes up after 2 hour to urinate then cannot go back to sleep. ? She urinates about 4 times at night, wears a pad. ? Feeling tired next  day. ? She  has no dysuria, does have increased frequency during the day. ?Trying to drink a  lot of water. ? ?   ? ?Relevant past medical, surgical, family and social history reviewed and updated as indicated. Interim medical history since our last visit reviewed. ?Allergies and medications reviewed and updated. ?Outpatient Medications Prior to Visit   ?Medication Sig Dispense Refill  ? ACCU-CHEK GUIDE test strip TEST TWICE DAILY 200 strip 1  ? Accu-Chek Softclix Lancets lancets as directed.    ? albuterol (PROVENTIL) (2.5 MG/3ML) 0.083% nebulizer solution Take 3 mLs (2.5 mg total) by nebulization every 4 (four) hours as needed for wheezing or shortness of breath. 150 mL 5  ? albuterol (VENTOLIN HFA) 108 (90 Base) MCG/ACT inhaler INHALE 1 TO 2 PUFFS BY MOUTH EVERY 6 HOURS FOR PAIN OR WHEEZING OR SHORTNESS OF BREATH 18 g 2  ? B Complex-Folic Acid (BENFOTIAMINE MULTI-B) CAPS Take 1 capsule by mouth daily. 30 capsule 6  ? Blood Glucose Monitoring Suppl (ACCU-CHEK GUIDE ME) w/Device KIT See admin instructions.    ? Cholecalciferol (VITAMIN D PO) Take 1 tablet by mouth daily.    ? clobetasol cream (TEMOVATE) 2.87 % Apply 1 application topically daily as needed. 30 g 1  ? dextromethorphan (DELSYM) 30 MG/5ML liquid Take 30 mg by mouth as needed for cough.    ? gabapentin (NEURONTIN) 100 MG capsule Take 1 capsule (100 mg total) by mouth 3 (three) times daily. 90 capsule 3  ? glipiZIDE (GLUCOTROL XL) 10 MG 24 hr tablet TAKE 1 TABLET(10 MG) BY MOUTH DAILY WITH BREAKFAST 90 tablet 0  ? ipratropium-albuterol (DUONEB) 0.5-2.5 (3) MG/3ML SOLN USE 3 ML VIA NEBULIZER EVERY 6 HOURS AS NEEDED 360 mL  1  ? levothyroxine (SYNTHROID) 75 MCG tablet TAKE 1 TABLET BY MOUTH EVERY DAY BEFORE BREAKFAST 90 tablet 0  ? metFORMIN (GLUCOPHAGE) 500 MG tablet Take 1 tablet (500 mg total) by mouth 2 (two) times daily with a meal. 180 tablet 3  ? montelukast (SINGULAIR) 10 MG tablet TAKE 1 TABLET(10 MG) BY MOUTH AT BEDTIME 30 tablet 3  ? Multiple Vitamin (MULTIVITAMIN) capsule Take 1 capsule by mouth daily.    ? nystatin powder APPLY EXTERNALLY TO THE AFFECTED AREA TWICE DAILY 30 g 1  ? omeprazole (PRILOSEC) 20 MG capsule Take 20 mg by mouth daily.    ? predniSONE (DELTASONE) 5 MG tablet TAKE 1 TABLET(5 MG) BY MOUTH DAILY WITH BREAKFAST 90 tablet 0  ? predniSONE (DELTASONE) 20 MG tablet 3 tabs by  mouth daily x 3 days, then 2 tabs by mouth daily x 2 days then 1 tab by mouth daily x 2 days 15 tablet 0  ? ?No facility-administered medications prior to visit.  ?  ? ?Per HPI unless specifically indicated in ROS section below ?Review of Systems  ?Constitutional:  Negative for fatigue and fever.  ?HENT:  Negative for congestion.   ?Eyes:  Negative for pain.  ?Respiratory:  Negative for cough and shortness of breath.   ?Cardiovascular:  Negative for chest pain, palpitations and leg swelling.  ?Gastrointestinal:  Negative for abdominal pain.  ?Genitourinary:  Negative for dysuria and vaginal bleeding.  ?Musculoskeletal:  Negative for back pain.  ?Neurological:  Negative for syncope, light-headedness and headaches.  ?Psychiatric/Behavioral:  Negative for dysphoric mood.   ?Objective:  ?BP 140/76   Bradshaw 89   Temp 98.2 ?F (36.8 ?C) (Oral)   Ht 5' 2" (1.575 m)   Wt 185 lb 5 oz (84.1 kg)   SpO2 96%   BMI 33.89 kg/m?   ?Wt Readings from Last 3 Encounters:  ?03/18/22 185 lb 5 oz (84.1 kg)  ?03/03/22 187 lb 9 oz (85.1 kg)  ?02/21/22 187 lb 9 oz (85.1 kg)  ?  ?  ?Physical Exam ?Constitutional:   ?   General: She is not in acute distress. ?   Appearance: Normal appearance. She is well-developed. She is obese. She is not ill-appearing or toxic-appearing.  ?HENT:  ?   Head: Normocephalic.  ?   Right Ear: Hearing, tympanic membrane, ear canal and external ear normal. Tympanic membrane is not erythematous, retracted or bulging.  ?   Left Ear: Hearing, tympanic membrane, ear canal and external ear normal. Tympanic membrane is not erythematous, retracted or bulging.  ?   Nose: No mucosal edema or rhinorrhea.  ?   Right Sinus: No maxillary sinus tenderness or frontal sinus tenderness.  ?   Left Sinus: No maxillary sinus tenderness or frontal sinus tenderness.  ?   Mouth/Throat:  ?   Pharynx: Uvula midline.  ?Eyes:  ?   General: Lids are normal. Lids are everted, no foreign bodies appreciated.  ?   Conjunctiva/sclera:  Conjunctivae normal.  ?   Pupils: Pupils are equal, round, and reactive to light.  ?Neck:  ?   Thyroid: No thyroid mass or thyromegaly.  ?   Vascular: No carotid bruit.  ?   Trachea: Trachea normal.  ?Cardiovascular:  ?   Rate and Rhythm: Normal rate and regular rhythm.  ?   Pulses: Normal pulses.  ?   Heart sounds: Normal heart sounds, S1 normal and S2 normal. No murmur heard. ?  No friction rub. No gallop.  ?Pulmonary:  ?  Effort: Pulmonary effort is normal. No tachypnea or respiratory distress.  ?   Breath sounds: Normal breath sounds. No decreased breath sounds, wheezing, rhonchi or rales.  ?Abdominal:  ?   General: Bowel sounds are normal.  ?   Palpations: Abdomen is soft.  ?   Tenderness: There is no abdominal tenderness.  ?Musculoskeletal:  ?   Cervical back: Normal range of motion and neck supple.  ?Skin: ?   General: Skin is warm and dry.  ?   Findings: No rash.  ?Neurological:  ?   Mental Status: She is alert.  ?Psychiatric:     ?   Mood and Affect: Mood is not anxious or depressed.     ?   Speech: Speech normal.     ?   Behavior: Behavior normal. Behavior is cooperative.     ?   Thought Content: Thought content normal.     ?   Judgment: Judgment normal.  ? ?   ?Results for orders placed or performed in visit on 03/08/22  ?CBC with Differential/Platelet  ?Result Value Ref Range  ? WBC 11.3 (H) 4.0 - 10.5 K/uL  ? RBC 4.63 3.87 - 5.11 Mil/uL  ? Hemoglobin 14.1 12.0 - 15.0 g/dL  ? HCT 42.1 36.0 - 46.0 %  ? MCV 90.9 78.0 - 100.0 fl  ? MCHC 33.4 30.0 - 36.0 g/dL  ? RDW 14.0 11.5 - 15.5 %  ? Platelets 362.0 150.0 - 400.0 K/uL  ? Neutrophils Relative % 78.5 (H) 43.0 - 77.0 %  ? Lymphocytes Relative 15.4 12.0 - 46.0 %  ? Monocytes Relative 4.7 3.0 - 12.0 %  ? Eosinophils Relative 0.7 0.0 - 5.0 %  ? Basophils Relative 0.7 0.0 - 3.0 %  ? Neutro Abs 8.9 (H) 1.4 - 7.7 K/uL  ? Lymphs Abs 1.7 0.7 - 4.0 K/uL  ? Monocytes Absolute 0.5 0.1 - 1.0 K/uL  ? Eosinophils Absolute 0.1 0.0 - 0.7 K/uL  ? Basophils Absolute 0.1 0.0  - 0.1 K/uL  ? ? ? ?COVID 19 screen:  No recent travel or known exposure to [place] ?The patient denies respiratory symptoms of COVID 19 at this time. ?The importance of social distancing was discussed today.  ? ?Assessment and Pl

## 2022-03-21 ENCOUNTER — Encounter: Payer: Self-pay | Admitting: Pulmonary Disease

## 2022-03-21 ENCOUNTER — Ambulatory Visit (INDEPENDENT_AMBULATORY_CARE_PROVIDER_SITE_OTHER): Payer: Medicare Other | Admitting: Pulmonary Disease

## 2022-03-21 VITALS — BP 124/72 | HR 83 | Temp 97.8°F | Ht 62.0 in | Wt 185.0 lb

## 2022-03-21 DIAGNOSIS — J449 Chronic obstructive pulmonary disease, unspecified: Secondary | ICD-10-CM

## 2022-03-21 DIAGNOSIS — R0602 Shortness of breath: Secondary | ICD-10-CM | POA: Diagnosis not present

## 2022-03-21 DIAGNOSIS — E66811 Obesity, class 1: Secondary | ICD-10-CM

## 2022-03-21 DIAGNOSIS — E669 Obesity, unspecified: Secondary | ICD-10-CM | POA: Diagnosis not present

## 2022-03-21 NOTE — Patient Instructions (Signed)
We are going to get some breathing tests and an overnight oxygen test. ? ?We are going to refer you to the pulmonary rehab program I think that this will help you with your shortness of breath. ? ?We will see you in follow-up in 2 to 3 months time call sooner should any new problems arise. ?

## 2022-03-21 NOTE — Progress Notes (Signed)
Subjective:    Patient ID: Megan Bradshaw, female    DOB: [birth year], 88 y.o.   MRN: [id number] Patient Care Team: Megan Seltzer, MD as PCP - General (Family Medicine) Megan Bradshaw, [facility] as Pharmacist (Pharmacist)  Chief Complaint  Patient presents with   Follow-up    SOB with exertion and occ wheezing.    Pt profile: 88 y.o. F, former smoker, moved from [place]. Initially seen by Dr Megan Bradshaw. Diagnosed with COPD in 2011. Graded as Gold I by Dr.  Sherene Bradshaw. Chronic prednisone therapy for non-pulmonary diagnosis.  Last PFTs 02/16/2016.  Followed by Dr. Sung Bradshaw since October 2016. Established with me in November 2020 after Dr. Sung Bradshaw' departure from the practice.   PROBLEMS: Gold II COPD with chronic asthmatic bronchitis Chronic refractory cough Class III dyspnea out of proportion to PFT parameters   DATA: 10/22/14 PFTs: mild obstruction, FEV1 1.36 L (81%), FEV1/FVC 53%, TLC normal, DLCO 51% pred 02/16/16 PFTs: FVC: 2.23 L (90 %pred), FEV1: 1.30 L (71 %pred), FEV1/FVC: 58% , TLC: invalid, DLCO 78% pred, consistent with mild/moderate obstructive defect. 10/14/2019 2D echo: LVEF 50 to 55%, normal diastolics, normal pulmonary artery systolic pressure 03/31/2020 overnight oximetry on RA: No oxygen desaturations of clinical significance.  Lowest O2 sat 88% of less than 2 minutes duration.  Average O2 sat 93%   INTERVAL: Last seen in this office 08/02/2021 by me.  No new issues in the interval.  Persistent use with dyspnea proportion to pulmonary function findings.  HPI Megan Bradshaw is an 88 year old former smoker (45 PY) who presents for follow-up on the issue of dyspnea and chronic asthmatic bronchitis/stage II COPD.  This is a scheduled visit.  The patient presents with complaint of dyspnea however, this is not over her usual.  She did have some issues 3 to 4 weeks ago where she had an exacerbation required antibiotics at that time and some prednisone.  This resolved her exacerbation.  Previously  she had issues with cough after COVID-19 diagnosis however this has now resolved.  She does not have purulent sputum production does have increase in whitish to grayish sputum.  No hemoptysis.  Issues with fatigue are unchanged.  Not endorse any sleep disturbance.  Does have issues when she takes gabapentin and that she feels "drugged" for the rest of the day.  Has not had any orthopnea or paroxysmal nocturnal dyspnea.  No lower extremity edema.  No other symptomatology endorsed.  Review of Systems A 10 point review of systems was performed and it is as noted above otherwise negative.  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) 09/22/2014   Hypothyroid 09/22/2014   Stage 2 moderate COPD by GOLD classification (HCC) 08/12/2014   Social History   Tobacco Use   Smoking status: Former    Packs/day: 1.50    Years: 30.00    Pack years: 45.00    Types: Cigarettes    Quit date: 11/15/1987    Years since quitting: 34.3   Smokeless tobacco: Never  Substance Use Topics   Alcohol use: No    Alcohol/week: 0.0 standard drinks  Allergies  Allergen Reactions   Augmentin [Amoxicillin-Pot Clavulanate] Diarrhea   Current Meds  Medication Sig   ACCU-CHEK GUIDE test strip TEST TWICE DAILY   Accu-Chek Softclix Lancets lancets as directed.   albuterol (PROVENTIL) (2.5 MG/3ML) 0.083% nebulizer solution Take 3 mLs (2.5 mg total) by nebulization every 4 (four) hours as needed for wheezing or shortness of breath.   albuterol (VENTOLIN HFA) 108 (90 Base) MCG/ACT inhaler INHALE 1 TO 2  PUFFS BY MOUTH EVERY 6 HOURS FOR PAIN OR WHEEZING OR SHORTNESS OF BREATH   B Complex-Folic Acid (BENFOTIAMINE MULTI-B) CAPS Take 1 capsule by mouth daily.   Blood Glucose Monitoring Suppl (ACCU-CHEK GUIDE ME) w/Device KIT See admin instructions.   Cholecalciferol (VITAMIN D PO) Take 1 tablet by mouth daily.   clobetasol cream (TEMOVATE) 0.05 % Apply 1 application topically daily as needed.   dextromethorphan (DELSYM) 30 MG/5ML liquid Take 30 mg by mouth as needed for cough.   gabapentin (NEURONTIN) 100 MG capsule Take 1 capsule (100 mg total) by mouth 3 (three) times daily.   glipiZIDE (GLUCOTROL XL) 10 MG 24 hr tablet TAKE 1 TABLET(10 MG) BY MOUTH DAILY WITH BREAKFAST   ipratropium-albuterol (DUONEB) 0.5-2.5 (3) MG/3ML SOLN USE 3 ML VIA NEBULIZER EVERY 6 HOURS AS NEEDED   levothyroxine (SYNTHROID) 75 MCG tablet Take 1 tablet (75 mcg total) by mouth daily before breakfast.   metFORMIN (GLUCOPHAGE) 500 MG tablet Take 1 tablet (500 mg total) by mouth 2 (two) times daily with a meal.   mirabegron ER (MYRBETRIQ) 25 MG TB24 tablet Take 1 tablet (25 mg total) by mouth daily.   montelukast (SINGULAIR) 10 MG tablet TAKE 1 TABLET(10 MG) BY MOUTH AT BEDTIME   Multiple Vitamin (MULTIVITAMIN) capsule Take 1 capsule by mouth daily.   nystatin powder APPLY EXTERNALLY TO THE AFFECTED AREA TWICE DAILY   omeprazole (PRILOSEC) 20 MG capsule Take 20 mg by mouth daily.   predniSONE (DELTASONE) 5 MG tablet TAKE 1 TABLET(5 MG) BY MOUTH DAILY WITH BREAKFAST   Immunization History  Administered Date(s) Administered   Fluad Quad(high Dose 65+) 09/03/2020, 08/02/2021   Influenza,inj,Quad PF,6+ Mos 08/20/2015, 07/22/2016, 08/14/2017, 08/23/2018, 08/29/2019   Influenza-Unspecified 08/14/2014   PFIZER(Purple Top)SARS-COV-2 Vaccination 01/07/2020, 01/28/2020, 10/11/2020   Pneumococcal Conjugate-13 08/20/2015   Pneumococcal Polysaccharide-23 08/29/2019       Objective:   Physical Exam BP 124/72 (BP Location: Right  Arm, Cuff Size: Large)   Pulse 83   Temp 97.8 F (36.6 C) (Temporal)   Ht 5\' 2"  (1.575 m)   Wt 185 lb (83.9 kg)   SpO2 94%   BMI 33.84 kg/m  GENERAL: Overweight elderly female, quite spry, well-groomed.  Fully ambulatory. No conversational dyspnea. No distress. HEAD: Normocephalic, atraumatic. EYES: Pupils equal, round, reactive to light.  No scleral icterus. MOUTH: Nose/mouth/throat not examined due to masking requirements for COVID 19. NECK: Supple. No thyromegaly. Trachea midline. No JVD.  No adenopathy. PULMONARY: Good air entry bilaterally. Coarse with no other adventitious sounds. CARDIOVASCULAR: S1 and S2. Regular rate and rhythm. Soft grade 1/6 systolic ejection murmur left sternal border. No rubs or gallops noted. ABDOMEN: Obese,nondistended. MUSCULOSKELETAL: No joint deformity, no clubbing, no edema.  She has increased AP diameter due to kyphosis.  There is tenderness along the lower right chest wall with no crepitus. NEUROLOGIC: No overt focal deficits noted. Speech is fluent. SKIN: Intact,warm,dry. On limited exam no rashes. PSYCH: Mood and behavior normal  Ambulatory oximetry was performed today: At rest heart rate was 87 bpm, resting  O2 sat 95%.  At maximum exercise heart rate 108, saturation nadir 94% patient was able to perform all 3 laps around the clinic at moderate pace, complained of shortness of breath.  Chest x-ray performed 03 March 2022, no acute abnormality, significant kyphosis changes of COPD:     Assessment & Plan:     ICD-10-CM   1. SOB (shortness of breath)  R06.02 AMB referral to pulmonary rehabilitation    CANCELED: Pulmonary Function Test [facility] Only   Multifactorial: Deconditioning/COPD Suspect deconditioning main driver    2. Stage 2 moderate COPD by GOLD classification (HCC)  J44.9 CANCELED: Pulse oximetry, overnight   Referred to pulmonary rehab Continue as needed DuoNeb    3. Obesity (BMI 30.0-34.9)  E66.9    Recommend weight loss      Orders Placed This Encounter  Procedures   AMB referral to pulmonary rehabilitation    Referral Priority:   Routine    Referral Type:   Consultation    Number of Visits Requested:   1   Will reassess with overnight oximetry and PFTs as well.  Patient has been referred to pulmonary rehab.  Will see the patient in follow-up in 2 to 3 months time she is to contact us prior to that time should any new difficulties arise.  Gailen Shelter, MD Advanced Bronchoscopy PCCM [facility]    *This note was dictated using voice recognition software/Dragon.  Despite best efforts to proofread, errors can occur which can change the meaning. Any transcriptional errors that result from this process are unintentional and may not be fully corrected at the time of dictation.

## 2022-04-05 ENCOUNTER — Encounter: Payer: Self-pay | Admitting: Pulmonary Disease

## 2022-04-06 ENCOUNTER — Other Ambulatory Visit: Payer: Self-pay | Admitting: Family Medicine

## 2022-04-07 ENCOUNTER — Other Ambulatory Visit: Payer: Self-pay | Admitting: Pulmonary Disease

## 2022-04-08 DIAGNOSIS — Z0279 Encounter for issue of other medical certificate: Secondary | ICD-10-CM

## 2022-04-12 ENCOUNTER — Telehealth: Payer: Self-pay | Admitting: Family Medicine

## 2022-04-12 NOTE — Telephone Encounter (Signed)
[facility] - Client TELEPHONE ADVICE RECORD [facility] Patient Name: Megan Bradshaw Gender: Female DOB: [birth year] Age: 88 Y 42 M 13 D Return Phone Number: [phone] (Secondary) Address: City/ State/ Zip: [place] [address] Client  [facility] - Client Client Site [place] - Day Provider Eliezer Lofts - MD Contact Type Call Who Is Calling Patient / Member / Family / Caregiver Call Type Triage / Clinical Caller Name N/A Relationship To Patient Self Return Phone Number [phone] (Secondary) Chief Complaint Blood Sugar High Reason for Call Symptomatic / Request for [place] states would like to speak to a nurse d/t high BS. Caller states BS 325 mg/dl Translation No Nurse Assessment Nurse: Mariel Sleet, RN, Erasmo Downer Date/Time (Eastern Time): 04/12/2022 12:18:00 PM Confirm and document reason for call. If symptomatic, describe symptoms. ---Caller states her blood sugar reading was 325 and does not have any insulin at home. Does the patient have any new or worsening symptoms? ---Yes Will a triage be completed? ---Yes Related visit to physician within the last 2 weeks? ---No Does the PT have any chronic conditions? (i.e. diabetes, asthma, this includes High risk factors for pregnancy, etc.) ---Yes List chronic conditions. ---diabetic Is this a behavioral health or substance abuse call? ---No Guidelines Guideline Title Affirmed Question Affirmed Notes Nurse Date/Time (Eastern Time) Diabetes - High Blood Sugar [1] Blood glucose > 300 mg/dL (16.7 mmol/L) AND [2] does not use insulin (e.g., not insulindependent; most people with type 2 diabetes) Mariel Sleet, RN, Erasmo Downer 04/12/2022 12:21:04 PM Disp. Time Eilene Ghazi Time) Disposition Final User PLEASE NOTE: All timestamps contained within this report are represented as [place] Standard Time. CONFIDENTIALTY  NOTICE: This fax transmission is intended only for the addressee. It contains information that is legally privileged, confidential or otherwise protected from use or disclosure. If you are not the intended recipient, you are strictly prohibited from reviewing, disclosing, copying using or disseminating any of this information or taking any action in reliance on or regarding this information. If you have received this fax in error, please notify us immediately by telephone so that we can arrange for its return to [place]. Phone: [phone], Toll-Free: [phone], Fax: [phone] Page: 2 of 2 Call Id: [id number] 04/12/2022 12:25:39 PM [place], RN, Laurin Coder Disagree/Comply Comply Caller Understands Yes PreDisposition Call Doctor Care Advice Given Per Guideline CONTINUE DIABETES PILLS: * Continue taking your diabetes pills. HOME CARE: * You should be able to treat this at home. TREATMENT - LIQUIDS: * Drink at least one glass (8 oz; 240 ml) of water per hour for the next 4 hours. Reason: Adequate hydration will help lower blood sugar. * Try to drink 6 to 8 glasses of water each day. DAILY BLOOD GLUCOSE GOALS: CALL BACK IF: * Vomiting lasting over 4 hours or unable to drink any fluids * Rapid breathing occurs * You become worse CARE ADVICE given per Diabetes - High Blood Sugar (Adult) guideline

## 2022-04-12 NOTE — Telephone Encounter (Signed)
Pt called and wanted to speak with a nurse about her blood sugar being low. I transferred her to Access Nurse (as requested). Please return a call when possible, thanks.  Callback Number: [phone]

## 2022-04-12 NOTE — Telephone Encounter (Signed)
I spoke with pt; pt is drinking a lot of water and BS now is 232. Pt said her BS was lower this morning and she chewed 4 of the glucose tablets and pt thinks that is what caused BS to go up. Pt said she feels oK now. No diabetic symptoms of nervousness or fast heart beat. Pt said she is resting and drinking plenty of water. UC & ED precautions given and pt voiced understanding. Sending note to Dr Diona Browner and Butch Penny CMA.

## 2022-04-29 DIAGNOSIS — J449 Chronic obstructive pulmonary disease, unspecified: Secondary | ICD-10-CM | POA: Diagnosis not present

## 2022-05-01 ENCOUNTER — Other Ambulatory Visit: Payer: Self-pay | Admitting: Family Medicine

## 2022-05-01 NOTE — Telephone Encounter (Signed)
Last office visit 03/18/22 for OAB.  Last refilled 01/20/22 for #90 with no refills.  No future appointments with PCP.

## 2022-05-05 ENCOUNTER — Telehealth: Payer: Self-pay | Admitting: Podiatry

## 2022-05-05 NOTE — Telephone Encounter (Signed)
Pt states she is running out of medication sooner than its time. She is taking 3 pills per day and feels that she needs a 90 day supply to get her through. She is planning to pick up a 30 day supply today. Would like her next refill to be a 90 days supply if possible.  gabapentin (NEURONTIN) 100 MG capsule   [id number] - [address], [place] [address]  [address] [place], [place] [address]  Phone:  [phone]  Fax:  [phone]   Please advise.

## 2022-05-09 ENCOUNTER — Encounter: Payer: Self-pay | Admitting: Family Medicine

## 2022-05-09 ENCOUNTER — Ambulatory Visit: Payer: Medicare Other | Admitting: Family

## 2022-05-09 ENCOUNTER — Ambulatory Visit (INDEPENDENT_AMBULATORY_CARE_PROVIDER_SITE_OTHER): Payer: Medicare Other | Admitting: Family Medicine

## 2022-05-09 VITALS — BP 138/80 | HR 82 | Temp 97.4°F | Ht 62.0 in | Wt 184.5 lb

## 2022-05-09 DIAGNOSIS — J449 Chronic obstructive pulmonary disease, unspecified: Secondary | ICD-10-CM | POA: Diagnosis not present

## 2022-05-09 DIAGNOSIS — J441 Chronic obstructive pulmonary disease with (acute) exacerbation: Secondary | ICD-10-CM

## 2022-05-09 DIAGNOSIS — J208 Acute bronchitis due to other specified organisms: Secondary | ICD-10-CM

## 2022-05-09 MED ORDER — DOXYCYCLINE HYCLATE 100 MG PO TABS: 100.0000 mg | ORAL_TABLET | Freq: Two times a day (BID) | ORAL | 0 refills | Status: DC

## 2022-05-09 MED ORDER — PREDNISONE 20 MG PO TABS: ORAL_TABLET | ORAL | 0 refills | Status: DC

## 2022-05-14 IMAGING — DX DG CHEST 2V
2 series · 2 of 2 positions shown · non-contrast
Comparison: Radiograph 05/31/2021

CLINICAL DATA: [place], sob

EXAM:
CHEST - 2 VIEW

[chest pa]
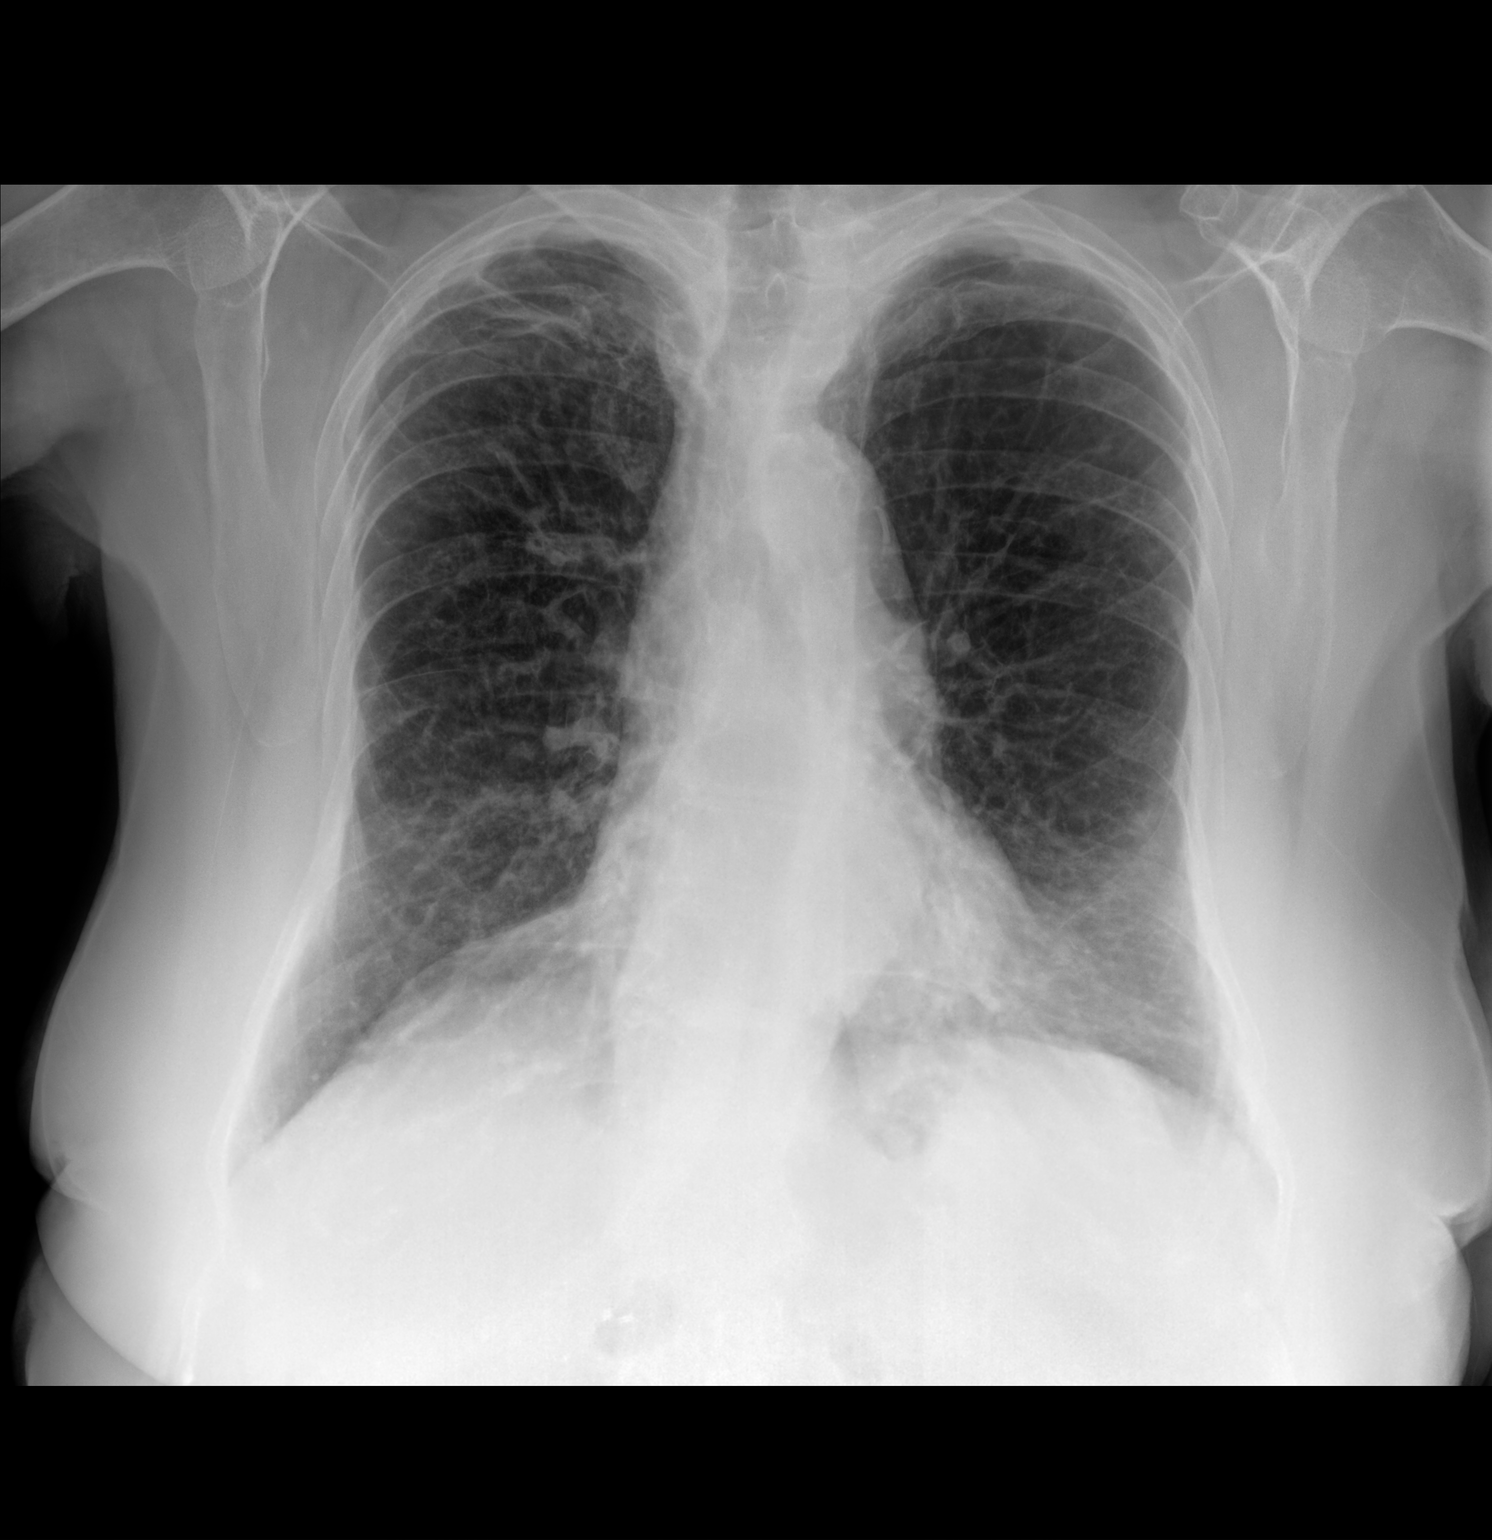

[chest lat]
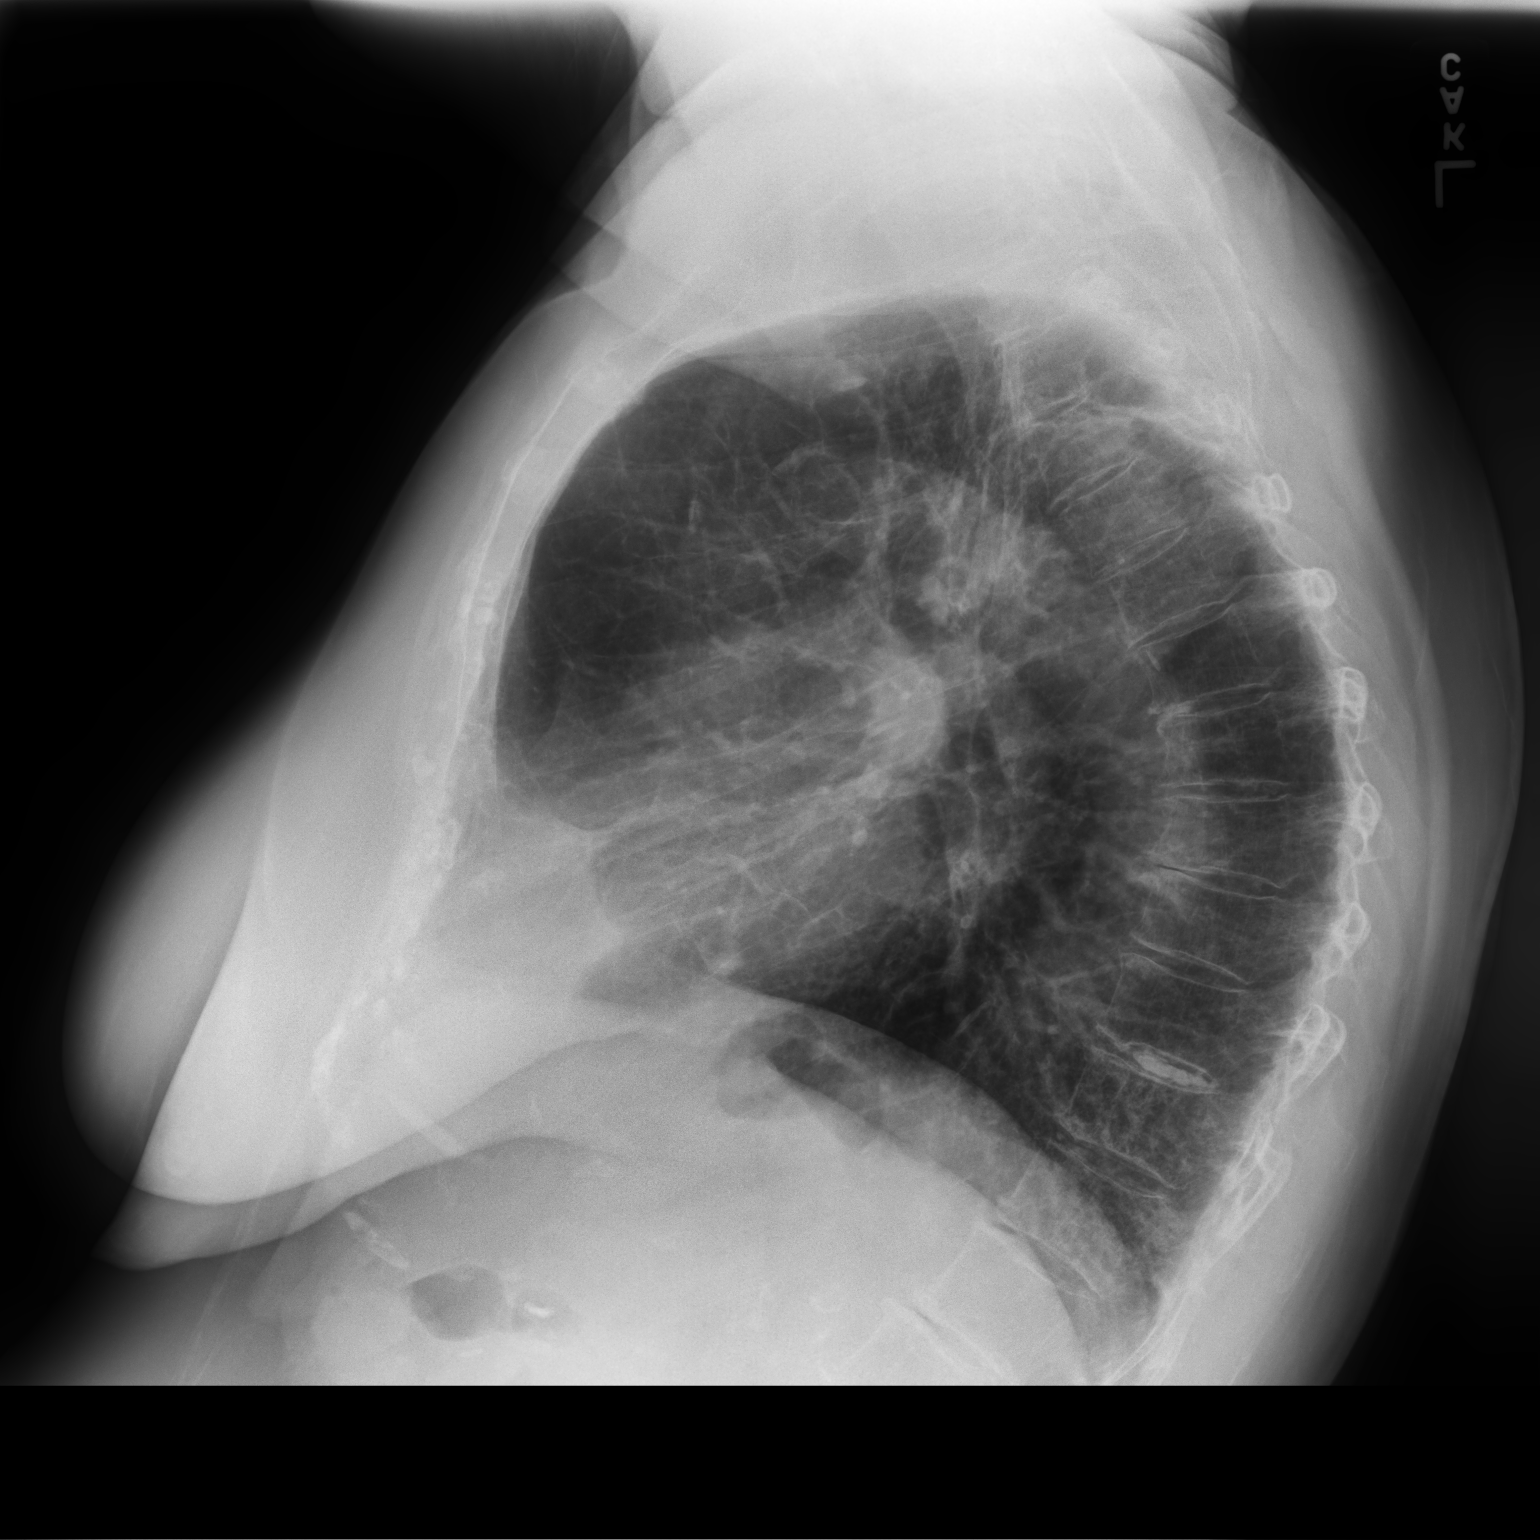

[2 of 2 positions shown; findings below may reference images not displayed]

FINDINGS: Unchanged cardiomediastinal silhouette. There is unchanged right
apical scarring. There is no focal airspace consolidation. There is
no pleural effusion. No pneumothorax. There is no acute osseous
abnormality. Facet spondylosis.
IMPRESSION: No evidence of acute cardiopulmonary disease.

## 2022-05-24 ENCOUNTER — Ambulatory Visit (INDEPENDENT_AMBULATORY_CARE_PROVIDER_SITE_OTHER): Payer: Medicare Other | Admitting: Pulmonary Disease

## 2022-05-24 ENCOUNTER — Encounter: Payer: Self-pay | Admitting: Pulmonary Disease

## 2022-05-24 VITALS — BP 134/82 | HR 87 | Temp 98.0°F | Ht 62.0 in | Wt 184.6 lb

## 2022-05-24 DIAGNOSIS — R0602 Shortness of breath: Secondary | ICD-10-CM

## 2022-05-24 DIAGNOSIS — J449 Chronic obstructive pulmonary disease, unspecified: Secondary | ICD-10-CM

## 2022-05-24 DIAGNOSIS — G4736 Sleep related hypoventilation in conditions classified elsewhere: Secondary | ICD-10-CM

## 2022-05-24 NOTE — Patient Instructions (Signed)
We are rescheduling the breathing test.  You are going to get an echocardiogram (heart test).  Continue using your Breo daily.  You have qualified for oxygen at nighttime, we are sending the order into [place] for the oxygen.  We will see you in follow-up in 2 to 3 months time call sooner should any new problems arise.

## 2022-05-24 NOTE — Progress Notes (Signed)
Subjective:    Patient ID: Megan Bradshaw, female    DOB: [birth year], 88 y.o.   MRN: [id number]  Patient Care Team: Excell Seltzer, MD as PCP - General (Family Medicine) Phil Dopp, [facility] as Pharmacist (Pharmacist)  Chief Complaint  Patient presents with   Follow-up    SOB with exertion and occ wheezing.    Pt profile: 88 y.o. F, former smoker, moved from [place]. Initially seen by Dr Sherene Sires. Diagnosed with COPD in 2011. Graded as Gold I by Dr.  Sherene Sires. Chronic prednisone therapy for non-pulmonary diagnosis.  Last PFTs 02/16/2016.  Followed by Dr. Sung Amabile since October 2016. Established with me in November 2020 after Dr. Sung Amabile' departure from the practice.   PROBLEMS: Gold II COPD with chronic asthmatic bronchitis Chronic refractory cough Class III dyspnea out of proportion to PFT parameters   DATA: 10/22/14 PFTs: mild obstruction, FEV1 1.36 L (81%), FEV1/FVC 53%, TLC normal, DLCO 51% pred 02/16/16 PFTs: FVC: 2.23 L (90 %pred), FEV1: 1.30 L (71 %pred), FEV1/FVC: 58% , TLC: invalid, DLCO 78% pred, consistent with mild/moderate obstructive defect 10/14/2019 2D echo: LVEF 50 to 55%, normal diastolics, normal pulmonary artery systolic pressure 03/31/2020 overnight oximetry on RA: No oxygen desaturations of clinical significance.  Lowest O2 sat 88% of less than 2 minutes duration.  Average O2 sat 93% 04/28/2022 overnight oximetry on RA: Patient qualified for nocturnal oxygen.  Basal SPO2 was 91% nadir to 87% total of 216 oxygen desaturation events, ODI of 21   INTERVAL: Last seen in this office 03/21/2022 by me.  Since last visit has had 1 exacerbation, no hospitalizations.  Treated with prednisone and doxycycline treated by Dr. Patsy Lager.  Resolved.  Did not have PFTs ordered during last visit.  No new complaint.  HPI [name] is an 88 year old former smoker (45 PY) who presents for follow-up on the issue of dyspnea and GOLD class II COPD.  This is a scheduled visit she was last seen on 21 Mar 2022 by me.  I am requested PFTs and an overnight oximetry.  Overnight oximetry was obtained on 15 June and results were made available to [place] today.  She has not had PFTs due to issues with scheduling but will have these rescheduled.  Recall she has been having issues with dyspnea out of proportion to her PFT parameters from prior this is likely due to a combination of obesity and deconditioning.  Reviewing her overnight oximetry however she does qualify for nocturnal oxygen.  The patient has been compliant with Breo Ellipta.  In the past she has tried LAMA however finds that this does not offer any relief over Breo.  Previously she has had issues with dry cough however does not endorse this today.  Overall she states that in general she feels "lousy" but will not elaborate what this entails.  She is having difficulties with insomnia likely due to her recent bout with COPD exacerbation for which she received a taper of steroids.  Does endorse waking up breathless at times.  Use of rescue inhaler is variable because it does not really help her according to her. She does not endorse any other new symptomatology today.    Review of Systems A 10 point review of systems was performed and it is as noted above otherwise negative.   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) 09/22/2014   Hypothyroid 09/22/2014   Stage 2 moderate COPD by GOLD classification (HCC) 08/12/2014   Social History   Tobacco Use   Smoking status: Former    Packs/day: 1.50    Years: 30.00    Total  pack years: 45.00    Types: Cigarettes    Quit date: 11/15/1987    Years since quitting: 34.5   Smokeless tobacco: Never  Substance Use Topics   Alcohol use: No    Alcohol/week: 0.0 standard drinks of alcohol   Allergies  Allergen Reactions   Augmentin [Amoxicillin-Pot Clavulanate] Diarrhea   Current Meds  Medication Sig   ACCU-CHEK GUIDE test strip TEST TWICE DAILY   Accu-Chek Softclix Lancets lancets Use to check blood sugar 2 times a day   albuterol (PROVENTIL) (2.5 MG/3ML) 0.083% nebulizer solution Take 3 mLs (2.5 mg total) by nebulization every 4 (four) hours as needed for wheezing or shortness of breath.   albuterol (VENTOLIN HFA) 108 (90 Base) MCG/ACT inhaler INHALE 1 TO 2 PUFFS BY MOUTH EVERY 6 HOURS FOR PAIN OR WHEEZING OR SHORTNESS OF BREATH   B Complex-Folic Acid (BENFOTIAMINE MULTI-B) CAPS Take 1 capsule by mouth daily.   Blood Glucose Monitoring Suppl (ACCU-CHEK GUIDE ME) w/Device KIT See admin instructions.   BREO ELLIPTA 100-25 MCG/ACT AEPB USE 1 INHALATION ORALLY    DAILY   Cholecalciferol (VITAMIN D PO) Take 1 tablet by mouth daily.   clobetasol cream (TEMOVATE) 0.05 % Apply 1 application topically daily as needed.   dextromethorphan (DELSYM) 30 MG/5ML liquid Take 30 mg by mouth as needed for cough.   gabapentin (NEURONTIN) 100 MG capsule Take 1 capsule (100 mg total) by mouth 3 (three) times daily.   glipiZIDE (GLUCOTROL XL) 10 MG 24 hr tablet TAKE 1 TABLET(10 MG) BY MOUTH DAILY WITH BREAKFAST   ipratropium-albuterol (DUONEB) 0.5-2.5 (3) MG/3ML SOLN USE 3 ML VIA NEBULIZER EVERY 6 HOURS AS NEEDED   levothyroxine (SYNTHROID) 75 MCG tablet Take 1 tablet (75 mcg total) by mouth daily before breakfast.   metFORMIN (GLUCOPHAGE) 500 MG tablet Take 1 tablet (500 mg total) by mouth 2 (two) times daily with a meal.   mirabegron ER (MYRBETRIQ) 25 MG TB24 tablet Take 1 tablet (25 mg total) by mouth daily.   montelukast (SINGULAIR) 10 MG tablet TAKE 1 TABLET(10 MG) BY MOUTH AT  BEDTIME   Multiple Vitamin (MULTIVITAMIN) capsule Take 1 capsule by mouth daily.   nystatin powder APPLY EXTERNALLY TO THE AFFECTED AREA TWICE DAILY   omeprazole (PRILOSEC) 20 MG capsule Take 20 mg by mouth daily.   predniSONE (DELTASONE) 5 MG tablet TAKE 1 TABLET(5 MG) BY MOUTH DAILY WITH BREAKFAST   Immunization History  Administered Date(s) Administered   Fluad Quad(high Dose 65+) 09/03/2020, 08/02/2021   Influenza,inj,Quad PF,6+ Mos 08/20/2015, 07/22/2016, 08/14/2017, 08/23/2018, 08/29/2019   Influenza-Unspecified 08/14/2014   PFIZER(Purple Top)SARS-COV-2 Vaccination 01/07/2020, 01/28/2020, 10/11/2020   Pneumococcal Conjugate-13 08/20/2015   Pneumococcal Polysaccharide-23 08/29/2019        Objective:   Physical Exam BP 134/82 (BP Location: Left Arm, Cuff Size: Normal)   Pulse 87   Temp 98 F (36.7 C) (Temporal)   Ht 5\' 2"  (1.575 m)   Wt 184 lb 9.6 oz (83.7 kg)   SpO2 97%  BMI 33.76 kg/m  GENERAL: Overweight elderly female, quite spry, well-groomed.  Fully ambulatory today with assistance of a cane. No conversational dyspnea. No distress. HEAD: Normocephalic, atraumatic. EYES: Pupils equal, round, reactive to light.  No scleral icterus. MOUTH: Nose/mouth/throat not examined due to masking requirements for COVID 19. NECK: Supple. No thyromegaly. Trachea midline. No JVD.  No adenopathy. PULMONARY: Good air entry bilaterally. Coarse with no other adventitious sounds. CARDIOVASCULAR: S1 and S2. Regular rate and rhythm. Soft grade 1/6 systolic ejection murmur left sternal border. No rubs or gallops noted. ABDOMEN: Obese,nondistended. MUSCULOSKELETAL: No joint deformity, no clubbing, no edema.  She has increased AP diameter due to kyphosis.  There is tenderness along the lower right chest wall with no crepitus. NEUROLOGIC: No overt focal deficits noted. Speech is fluent. SKIN: Intact,warm,dry. On limited exam no rashes. PSYCH: Mood and behavior normal.     Assessment & Plan:      ICD-10-CM   1. Stage 2 moderate COPD by GOLD classification (HCC)  J44.9 AMB REFERRAL FOR DME    CANCELED: AMB REFERRAL FOR DME   Reschedule PFTs, need to reassess Continue Breo and as needed albuterol Patient has declined use of LAMA    2. Nocturnal hypoxemia due to obstructive chronic bronchitis (HCC)  J44.9    G47.36    Will need oxygen at 2 L/min nocturnally DME order placed    3. SOB (shortness of breath)  R06.02 Pulmonary Function Test [facility] Only    ECHOCARDIOGRAM COMPLETE   Out of proportion to prior PFT parameters Multifactorial Obesity and deconditioning playing a part as well Check 2D echo for potential cardiac causes     Orders Placed This Encounter  Procedures   AMB REFERRAL FOR DME    Referral Priority:   Routine    Referral Type:   Durable Medical Equipment Purchase    Number of Visits Requested:   1   Pulmonary Function Test [facility] Only    Standing Status:   Future    Standing Expiration Date:   05/25/2023    Scheduling Instructions:     Next available.    Order Specific Question:   Full PFT: includes the following: basic spirometry, spirometry pre & post bronchodilator, diffusion capacity (DLCO), lung volumes    Answer:   Full PFT   ECHOCARDIOGRAM COMPLETE    Standing Status:   Future    Standing Expiration Date:   11/24/2022    Order Specific Question:   Where should this test be performed    Answer:   [facility]    Order Specific Question:   Perflutren DEFINITY (image enhancing agent) should be administered unless hypersensitivity or allergy exist    Answer:   Administer Perflutren    Order Specific Question:   Reason for exam-Echo    Answer:   Dyspnea  R06.00   We will see the patient in follow-up in 2 to 3 months time she is to contact us prior to that time should any new difficulties arise.  Gailen Shelter, MD Advanced Bronchoscopy P[facility]    *This note was dictated using voice recognition software/Dragon.   Despite best efforts to proofread, errors can occur which can change the meaning. Any transcriptional errors that result from this process are unintentional and may not be fully corrected at the time of dictation.

## 2022-05-25 ENCOUNTER — Telehealth: Payer: Self-pay | Admitting: Pulmonary Disease

## 2022-05-25 ENCOUNTER — Encounter: Payer: Self-pay | Admitting: Pulmonary Disease

## 2022-05-25 NOTE — Telephone Encounter (Signed)
Spoke to patient and provided her with [employer]'s contact number.  She voiced her understanding and had no further questions. Nothing further needed.

## 2022-05-27 ENCOUNTER — Telehealth: Payer: Self-pay | Admitting: Podiatry

## 2022-05-27 MED ORDER — GABAPENTIN 100 MG PO CAPS: 100.0000 mg | ORAL_CAPSULE | Freq: Three times a day (TID) | ORAL | 3 refills | Status: DC

## 2022-05-27 NOTE — Telephone Encounter (Signed)
Pt called asking if she could get a refill on her gabapentin but needed it to a 180 pill ordered as she takes 3 pills a day. She has the 90 day supply but is running out. The pharmacy is correct in the chart.

## 2022-05-31 ENCOUNTER — Ambulatory Visit: Payer: Medicare Other

## 2022-06-02 ENCOUNTER — Ambulatory Visit: Payer: Medicare Other | Attending: Pulmonary Disease

## 2022-06-02 DIAGNOSIS — R0602 Shortness of breath: Secondary | ICD-10-CM

## 2022-06-02 DIAGNOSIS — Z87891 Personal history of nicotine dependence: Secondary | ICD-10-CM | POA: Insufficient documentation

## 2022-06-02 DIAGNOSIS — E669 Obesity, unspecified: Secondary | ICD-10-CM | POA: Insufficient documentation

## 2022-06-02 LAB — PULMONARY FUNCTION TEST ARMC ONLY
DL/VA % pred: 56 %
DL/VA: 2.29 ml/min/mmHg/L
DLCO unc % pred: 54 %
DLCO unc: 9.34 ml/min/mmHg
FEF 25-75 Post: 0.43 L/sec
FEF 25-75 Pre: 0.43 L/sec
FEF2575-%Change-Post: 0 %
FEF2575-%Pred-Post: 52 %
FEF2575-%Pred-Pre: 52 %
FEV1-%Change-Post: 0 %
FEV1-%Pred-Post: 78 %
FEV1-%Pred-Pre: 78 %
FEV1-Post: 1.12 L
FEV1-Pre: 1.12 L
FEV1FVC-%Change-Post: 0 %
FEV1FVC-%Pred-Pre: 72 %
FEV6-%Change-Post: 0 %
FEV6-%Pred-Post: 111 %
FEV6-%Pred-Pre: 112 %
FEV6-Post: 2.03 L
FEV6-Pre: 2.04 L
FEV6FVC-%Change-Post: -1 %
FEV6FVC-%Pred-Post: 101 %
FEV6FVC-%Pred-Pre: 102 %
FVC-%Change-Post: 0 %
FVC-%Pred-Post: 109 %
FVC-%Pred-Pre: 110 %
FVC-Post: 2.15 L
FVC-Pre: 2.15 L
Post FEV1/FVC ratio: 52 %
Post FEV6/FVC ratio: 94 %
Pre FEV1/FVC ratio: 52 %
Pre FEV6/FVC Ratio: 96 %
RV % pred: 91 %
RV: 2.25 L
TLC % pred: 94 %
TLC: 4.48 L

## 2022-06-02 MED ORDER — ALBUTEROL SULFATE (2.5 MG/3ML) 0.083% IN NEBU
2.5000 mg | INHALATION_SOLUTION | Freq: Once | RESPIRATORY_TRACT | Status: AC
  Administered 2022-06-02: 2.5 mg via RESPIRATORY_TRACT
  Filled 2022-06-02: qty 3

## 2022-06-08 ENCOUNTER — Telehealth: Payer: Self-pay | Admitting: Pulmonary Disease

## 2022-06-08 NOTE — Telephone Encounter (Signed)
Noted  

## 2022-06-08 NOTE — Telephone Encounter (Signed)
Tyler Pita, MD  Claudette Head A, CMA Her breathing test show that she has moderate COPD.  This is a mild decrease from her last PFTs that were obtained in 2017.  She is currently on Breo but we could give her another try on Trelegy to see if this helps some with her shortness of breath.   Patient is aware of results and voiced her understanding.  She would like to hold off on trelegy for now and continue with Breo. She will call back if she wants to proceed with Trelegy. Nothing further needed.   Routing to Dr. Patsey Berthold as an Juluis Rainier

## 2022-06-10 ENCOUNTER — Ambulatory Visit (INDEPENDENT_AMBULATORY_CARE_PROVIDER_SITE_OTHER): Payer: Medicare Other | Admitting: Family Medicine

## 2022-06-10 ENCOUNTER — Encounter: Payer: Self-pay | Admitting: Family Medicine

## 2022-06-10 VITALS — BP 154/86 | HR 79 | Temp 98.2°F | Ht 62.0 in | Wt 186.1 lb

## 2022-06-10 DIAGNOSIS — J0100 Acute maxillary sinusitis, unspecified: Secondary | ICD-10-CM

## 2022-06-10 DIAGNOSIS — J449 Chronic obstructive pulmonary disease, unspecified: Secondary | ICD-10-CM | POA: Diagnosis not present

## 2022-06-10 MED ORDER — AMOXICILLIN 500 MG PO CAPS: 1000.0000 mg | ORAL_CAPSULE | Freq: Two times a day (BID) | ORAL | 0 refills | Status: DC

## 2022-06-10 NOTE — Patient Instructions (Signed)
Complete amoxicillin x 10 days.  Continue nasal saline.  Call if shortness of breath  is worsening or if not improving as expected.

## 2022-06-10 NOTE — Assessment & Plan Note (Signed)
Acute, no current evidence of COPD exacerbation.  She will continue nasal steroid, nasal saline and will start a 10-day course of amoxicillin 500 mg 2 tabs p.o. twice daily.

## 2022-06-10 NOTE — Assessment & Plan Note (Signed)
Chronic, stable control  No current evidence of exacerbation.  Continue albuterol as needed and daily Breo inhaler for control.

## 2022-06-10 NOTE — Progress Notes (Signed)
Patient ID: Megan Bradshaw, female    DOB: 10/14/1930, 88 y.o.   MRN: [id number]  This visit was conducted in person.  BP (!) 154/86   Pulse 79   Temp 98.2 F (36.8 C) (Oral)   Ht _0  (1.575 m)   Wt 186 lb 2 oz (84.4 kg)   SpO2 96%   BMI 34.04 kg/m    CC:  Chief Complaint  Patient presents with   Sinusitis    Subjective:   HPI: Megan Bradshaw  with history of COPDis a 88 y.o. female presenting on 06/10/2022 for Sinusitis   She has been having nasal discharge.. thick, dark yellow x 7 days.  Some facial pressure and tooth pain. NO ST, no Ear pain. Headaches.  Sneezg. Using flonase  and saline.   She has mild worsening of SOB.    Uses albuterol when she left the house.  Using [place] daily. Tried Trelegy but had SE.  On prednsione 5 mg daily.      Relevant past medical, surgical, family and social history reviewed and updated as indicated. Interim medical history since our last visit reviewed. Allergies and medications reviewed and updated. Outpatient Medications Prior to Visit  Medication Sig Dispense Refill   ACCU-CHEK GUIDE test strip TEST TWICE DAILY 200 strip 1   Accu-Chek Softclix Lancets lancets Use to check blood sugar 2 times a day 100 each 5   albuterol (PROVENTIL) (2.5 MG/3ML) 0.083% nebulizer solution Take 3 mLs (2.5 mg total) by nebulization every 4 (four) hours as needed for wheezing or shortness of breath. 150 mL 5   albuterol (VENTOLIN HFA) 108 (90 Base) MCG/ACT inhaler INHALE 1 TO 2 PUFFS BY MOUTH EVERY 6 HOURS FOR PAIN OR WHEEZING OR SHORTNESS OF BREATH 18 g 2   B Complex-Folic Acid (BENFOTIAMINE MULTI-B) CAPS Take 1 capsule by mouth daily. 30 capsule 6   Blood Glucose Monitoring Suppl (ACCU-CHEK GUIDE ME) w/Device KIT See admin instructions.     BREO ELLIPTA 100-25 MCG/ACT AEPB USE 1 INHALATION ORALLY    DAILY 180 each 2   Cholecalciferol (VITAMIN D PO) Take 1 tablet by mouth daily.     clobetasol cream (TEMOVATE) 3.21 % Apply 1 application  topically daily as needed. 30 g 1   dextromethorphan (DELSYM) 30 MG/5ML liquid Take 30 mg by mouth as needed for cough.     gabapentin (NEURONTIN) 100 MG capsule Take 1 capsule (100 mg total) by mouth 3 (three) times daily. 180 capsule 3   glipiZIDE (GLUCOTROL XL) 10 MG 24 hr tablet TAKE 1 TABLET(10 MG) BY MOUTH DAILY WITH BREAKFAST 90 tablet 1   ipratropium-albuterol (DUONEB) 0.5-2.5 (3) MG/3ML SOLN USE 3 ML VIA NEBULIZER EVERY 6 HOURS AS NEEDED 360 mL 1   levothyroxine (SYNTHROID) 75 MCG tablet Take 1 tablet (75 mcg total) by mouth daily before breakfast. 90 tablet 3   metFORMIN (GLUCOPHAGE) 500 MG tablet Take 1 tablet (500 mg total) by mouth 2 (two) times daily with a meal. 180 tablet 3   mirabegron ER (MYRBETRIQ) 25 MG TB24 tablet Take 1 tablet (25 mg total) by mouth daily. 30 tablet 11   montelukast (SINGULAIR) 10 MG tablet TAKE 1 TABLET(10 MG) BY MOUTH AT BEDTIME 30 tablet 3   Multiple Vitamin (MULTIVITAMIN) capsule Take 1 capsule by mouth daily.     nystatin powder APPLY EXTERNALLY TO THE AFFECTED AREA TWICE DAILY 30 g 1   omeprazole (PRILOSEC) 20 MG capsule Take 20 mg by mouth daily.  predniSONE (DELTASONE) 5 MG tablet TAKE 1 TABLET(5 MG) BY MOUTH DAILY WITH BREAKFAST 90 tablet 0   No facility-administered medications prior to visit.     Per HPI unless specifically indicated in ROS section below Review of Systems  Constitutional:  Negative for fatigue and fever.  HENT:  Negative for congestion.   Eyes:  Negative for pain.  Respiratory:  Negative for cough and shortness of breath.   Cardiovascular:  Negative for chest pain, palpitations and leg swelling.  Gastrointestinal:  Negative for abdominal pain.  Genitourinary:  Negative for dysuria and vaginal bleeding.  Musculoskeletal:  Negative for back pain.  Neurological:  Negative for syncope, light-headedness and headaches.  Psychiatric/Behavioral:  Negative for dysphoric mood.    Objective:  BP (!) 154/86   Pulse 79   Temp  98.2 F (36.8 C) (Oral)   Ht _0  (1.575 m)   Wt 186 lb 2 oz (84.4 kg)   SpO2 96%   BMI 34.04 kg/m   Wt Readings from Last 3 Encounters:  06/10/22 186 lb 2 oz (84.4 kg)  05/24/22 184 lb 9.6 oz (83.7 kg)  05/09/22 184 lb 8 oz (83.7 kg)      Physical Exam Constitutional:      General: She is not in acute distress.    Appearance: Normal appearance. She is well-developed. She is not ill-appearing or toxic-appearing.  HENT:     Head: Normocephalic.     Right Ear: Hearing, tympanic membrane, ear canal and external ear normal. Tympanic membrane is not erythematous, retracted or bulging.     Left Ear: Hearing, tympanic membrane, ear canal and external ear normal. Tympanic membrane is not erythematous, retracted or bulging.     Nose: No mucosal edema or rhinorrhea.     Right Turbinates: Swollen.     Left Turbinates: Swollen.     Right Sinus: Maxillary sinus tenderness present. No frontal sinus tenderness.     Left Sinus: Maxillary sinus tenderness present. No frontal sinus tenderness.     Mouth/Throat:     Pharynx: Uvula midline.  Eyes:     General: Lids are normal. Lids are everted, no foreign bodies appreciated.     Conjunctiva/sclera: Conjunctivae normal.     Pupils: Pupils are equal, round, and reactive to light.  Neck:     Thyroid: No thyroid mass or thyromegaly.     Vascular: No carotid bruit.     Trachea: Trachea normal.  Cardiovascular:     Rate and Rhythm: Normal rate and regular rhythm.     Pulses: Normal pulses.     Heart sounds: Normal heart sounds, S1 normal and S2 normal. No murmur heard.    No friction rub. No gallop.  Pulmonary:     Effort: Pulmonary effort is normal. No tachypnea or respiratory distress.     Breath sounds: Normal breath sounds. No decreased breath sounds, wheezing, rhonchi or rales.  Abdominal:     General: Bowel sounds are normal.     Palpations: Abdomen is soft.     Tenderness: There is no abdominal tenderness.  Musculoskeletal:      Cervical back: Normal range of motion and neck supple.  Skin:    General: Skin is warm and dry.     Findings: No rash.  Neurological:     Mental Status: She is alert.  Psychiatric:        Mood and Affect: Mood is not anxious or depressed.        Speech: Speech normal.  Behavior: Behavior normal. Behavior is cooperative.        Thought Content: Thought content normal.        Judgment: Judgment normal.       Results for orders placed or performed in visit on 06/02/22  Pulmonary Function Test [facility] Only  Result Value Ref Range   FVC-Pre 2.15 L   FVC-%Pred-Pre 110 %   FVC-Post 2.15 L   FVC-%Pred-Post 109 %   FVC-%Change-Post 0 %   FEV1-Pre 1.12 L   FEV1-%Pred-Pre 78 %   FEV1-Post 1.12 L   FEV1-%Pred-Post 78 %   FEV1-%Change-Post 0 %   FEV6-Pre 2.04 L   FEV6-%Pred-Pre 112 %   FEV6-Post 2.03 L   FEV6-%Pred-Post 111 %   FEV6-%Change-Post 0 %   Pre FEV1/FVC ratio 52 %   FEV1FVC-%Pred-Pre 72 %   Post FEV1/FVC ratio 52 %   FEV1FVC-%Change-Post 0 %   Pre FEV6/FVC Ratio 96 %   FEV6FVC-%Pred-Pre 102 %   Post FEV6/FVC ratio 94 %   FEV6FVC-%Pred-Post 101 %   FEV6FVC-%Change-Post -1 %   FEF 25-75 Pre 0.43 L/sec   FEF2575-%Pred-Pre 52 %   FEF 25-75 Post 0.43 L/sec   FEF2575-%Pred-Post 52 %   FEF2575-%Change-Post 0 %   RV 2.25 L   RV % pred 91 %   TLC 4.48 L   TLC % pred 94 %   DLCO unc 9.34 ml/min/mmHg   DLCO unc % pred 54 %   DL/VA 2.29 ml/min/mmHg/L   DL/VA % pred 56 %     COVID 19 screen:  No recent travel or known exposure to COVID19 The patient denies respiratory symptoms of COVID 19 at this time. The importance of social distancing was discussed today.   Assessment and Plan    Problem List Items Addressed This Visit     Acute non-recurrent maxillary sinusitis - Primary    Acute, no current evidence of COPD exacerbation.  She will continue nasal steroid, nasal saline and will start a 10-day course of amoxicillin 500 mg 2 tabs p.o. twice daily.       Relevant Medications   amoxicillin (AMOXIL) 500 MG capsule   COPD mixed type (HCC)    Chronic, stable control  No current evidence of exacerbation.  Continue albuterol as needed and daily Breo inhaler for control.      Meds ordered this encounter  Medications   amoxicillin (AMOXIL) 500 MG capsule    Sig: Take 2 capsules (1,000 mg total) by mouth 2 (two) times daily.    Dispense:  40 capsule    Refill:  0      Eliezer Lofts, MD

## 2022-06-27 ENCOUNTER — Ambulatory Visit (INDEPENDENT_AMBULATORY_CARE_PROVIDER_SITE_OTHER): Payer: Medicare Other

## 2022-06-27 DIAGNOSIS — R0602 Shortness of breath: Secondary | ICD-10-CM | POA: Diagnosis not present

## 2022-06-27 LAB — ECHOCARDIOGRAM COMPLETE
AR max vel: 2.65 cm2
AV Area VTI: 2.98 cm2
AV Area mean vel: 2.63 cm2
AV Mean grad: 3 mmHg
AV Peak grad: 5.1 mmHg
Ao pk vel: 1.13 m/s
Area-P 1/2: 2.08 cm2

## 2022-07-03 ENCOUNTER — Other Ambulatory Visit: Payer: Self-pay | Admitting: Family Medicine

## 2022-07-28 ENCOUNTER — Other Ambulatory Visit: Payer: Self-pay | Admitting: Family Medicine

## 2022-07-28 NOTE — Telephone Encounter (Signed)
Last office visit 06/10/22 for sinusitis.  Last refilled 05/02/22 for #90 with no refills.  No future appointments with PCP.

## 2022-07-29 ENCOUNTER — Ambulatory Visit (INDEPENDENT_AMBULATORY_CARE_PROVIDER_SITE_OTHER): Payer: Medicare Other | Admitting: Pulmonary Disease

## 2022-07-29 ENCOUNTER — Other Ambulatory Visit (HOSPITAL_COMMUNITY): Payer: Self-pay

## 2022-07-29 ENCOUNTER — Telehealth: Payer: Self-pay | Admitting: *Deleted

## 2022-07-29 ENCOUNTER — Encounter: Payer: Self-pay | Admitting: Pulmonary Disease

## 2022-07-29 VITALS — BP 120/78 | HR 80 | Temp 97.9°F | Ht 62.0 in | Wt 184.2 lb

## 2022-07-29 DIAGNOSIS — J449 Chronic obstructive pulmonary disease, unspecified: Secondary | ICD-10-CM | POA: Diagnosis not present

## 2022-07-29 DIAGNOSIS — G4736 Sleep related hypoventilation in conditions classified elsewhere: Secondary | ICD-10-CM

## 2022-07-29 DIAGNOSIS — J329 Chronic sinusitis, unspecified: Secondary | ICD-10-CM

## 2022-07-29 MED ORDER — MUPIROCIN 2 % EX OINT
1.0000 | TOPICAL_OINTMENT | Freq: Two times a day (BID) | CUTANEOUS | 0 refills | Status: DC
Start: 2022-07-29 — End: 2022-12-13

## 2022-07-29 MED ORDER — DOXYCYCLINE HYCLATE 100 MG PO TABS
100.0000 mg | ORAL_TABLET | Freq: Two times a day (BID) | ORAL | 0 refills | Status: DC
Start: 2022-07-29 — End: 2022-08-04

## 2022-07-29 NOTE — Patient Instructions (Addendum)
I have sent a request that [employer] give you a humidifier bottle for your concentrator.  I recommend that you get  flu and RSV vaccines.  [facility] does carry these.  Weight after you have completed your antibiotics before getting it.  I have sent in a prescription for an antibiotic ointment and an antibiotic that you will take twice a day.  The the antibiotic that you will take by mouth will be for 7 days, be very careful with exposure to the sun as it can cause a sunburn.  Just make sure you wear sunscreen and face covering if you go outside in the sun.  We will check with our pharmacy team to see if there is a cheaper alternative for Breo.  We will see you in follow-up in 3 months time call sooner should any new problems arise.

## 2022-07-29 NOTE — Telephone Encounter (Signed)
error 

## 2022-07-29 NOTE — Progress Notes (Signed)
Subjective:    Patient ID: Megan Bradshaw, female    DOB: 1930/06/29, 88 y.o.   MRN: [id number] Patient Care Team: Jinny Sanders, MD as PCP - General (Family Medicine) Debbora Dus, [facility] as Pharmacist (Pharmacist)  No chief complaint on file.  Pt profile: 88  y.o. F, former smoker, moved from [place]. Initially seen by Dr Melvyn Novas. Diagnosed with COPD in 2011. Graded as Gold I by Dr.  Melvyn Novas. Chronic prednisone therapy for non-pulmonary diagnosis.  Last PFTs 02/16/2016.  Followed by Dr. Alva Garnet since October 2016. Established with me in November 2020 after Dr. Alva Garnet' departure from the practice.   PROBLEMS: Gold II COPD with chronic asthmatic bronchitis Chronic refractory cough Class III dyspnea out of proportion to PFT parameters   DATA: 10/22/14 PFTs: mild obstruction, FEV1 1.36 L (81%), FEV1/FVC 53%, TLC normal, DLCO 51% pred 02/16/16 PFTs: FVC: 2.23 L (90 %pred), FEV1: 1.30 L (71 %pred), FEV1/FVC: 58% , TLC: invalid, DLCO 78% pred, consistent with mild/moderate obstructive defect 10/14/2019 2D echo: LVEF 50 to 01%, normal diastolics, normal pulmonary artery systolic pressure 02/72/5366 overnight oximetry on RA: No oxygen desaturations of clinical significance.  Lowest O2 sat 88% of less than 2 minutes duration.  Average O2 sat 93% 04/28/2022 overnight oximetry on RA: Patient qualified for nocturnal oxygen.  Basal SPO2 was 91% nadir to 87% total of 216 oxygen desaturation events, ODI of 21 06/02/2022 PFTs: FEV1 1.12 L or 78% predicted, FVC is 2.15 L or 110% predicted.  FEV1/FVC 52%, no bronchodilator response.  Lung volumes were normal.  Diffusion capacity moderately reduced.  Consistent with moderately severe obstructive airways disease, restriction probable likely due to obesity.  Moderately severe diffusion defect abnormality.  Since the prior study of 02/16/19/2017 there was a decrease in FEV1 by 180 mL.  INTERVAL: Last seen in this office 24 May 2022 by me.  No new issues in the  interval.  Persistent use with dyspnea proportion to pulmonary function findings.  Does have nocturnal hypoxemia, compliant with O2 with the exception of the last few days due to nasal soreness.  HPI Megan Bradshaw is an 88 year old former smoker (3 PY) who presents for follow-up on the issue of dyspnea and GOLD class II COPD.  This is a scheduled visit she was last seen on 24 May 2022 by me.  Dyspnea is out of proportion to her PFT findings.  She has been compliant with overnight oxygen however she has had difficulties with nasal soreness from the left nostril over the last several days as has not been able to use the nasal cannula.  She does not have a humidifier bottle for her concentrator.  The patient has been compliant with Breo Ellipta.  In the past she has tried LAMA however finds that this does not offer any relief over Breo.  Her only complaint with the [facility] is that the price has gone up again and she is having difficulty obtaining the medication due to this.  She has had some issues with a cough productive of yellowish sputum and greenish nasal discharge.  Feels she has a sinus infection.  She has been sleeping well when she is able to wear the oxygen.  No nocturnal awakenings of late.  Use of rescue inhaler is variable because it does not really help her according to her.   Does not have any fevers, chills or sweats.  No hemoptysis.She does not endorse any other new symptomatology today.   Review of Systems A 10 point review of  systems was performed and it is as noted above otherwise negative.  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) ([place]) 09/22/2014   Hypothyroid 09/22/2014   COPD mixed type ([place]) 08/12/2014   Social History   Tobacco Use   Smoking status: Former    Packs/day: 1.50    Years: 30.00    Total pack years: 45.00    Types: Cigarettes    Quit date: 11/15/1987    Years since quitting: 34.7   Smokeless tobacco: Never  Substance Use Topics   Alcohol use: No    Alcohol/week: 0.0 standard drinks of alcohol   Allergies  Allergen Reactions   Augmentin [Amoxicillin-Pot Clavulanate] Diarrhea   Current Meds  Medication Sig   ACCU-CHEK GUIDE test strip TEST TWICE DAILY   Accu-Chek Softclix Lancets lancets Use to check blood sugar 2 times a day   albuterol (PROVENTIL) (2.5 MG/3ML) 0.083% nebulizer solution Take 3 mLs (2.5 mg total) by nebulization every 4 (four) hours as needed for wheezing or shortness of breath.   albuterol (VENTOLIN HFA) 108 (90 Base) MCG/ACT inhaler INHALE 1 TO 2 PUFFS BY MOUTH EVERY 6 HOURS FOR PAIN OR WHEEZING OR SHORTNESS OF BREATH   B Complex-Folic Acid (BENFOTIAMINE MULTI-B) CAPS Take 1 capsule by mouth daily.   Blood Glucose Monitoring Suppl (ACCU-CHEK GUIDE ME) w/Device KIT See admin instructions.   BREO ELLIPTA 100-25 MCG/ACT AEPB USE 1 INHALATION ORALLY    DAILY   Cholecalciferol (VITAMIN D PO) Take 1 tablet by mouth daily.   clobetasol cream (TEMOVATE) 2.11 % Apply 1 application topically daily as needed.   dextromethorphan (DELSYM) 30 MG/5ML liquid Take 30 mg by mouth as needed for cough.   gabapentin (NEURONTIN) 100 MG capsule Take 1 capsule (100 mg total) by mouth 3 (three) times daily.   glipiZIDE (GLUCOTROL XL) 10 MG 24 hr tablet TAKE 1 TABLET(10 MG) BY MOUTH DAILY WITH BREAKFAST   ipratropium-albuterol (DUONEB) 0.5-2.5 (3) MG/3ML SOLN USE 3 ML VIA NEBULIZER EVERY 6 HOURS AS NEEDED   levothyroxine (SYNTHROID) 75 MCG tablet Take 1 tablet (75 mcg total) by mouth  daily before breakfast.   metFORMIN (GLUCOPHAGE) 500 MG tablet Take 1 tablet (500 mg total) by mouth 2 (two) times daily with a meal.   mirabegron ER (MYRBETRIQ) 25 MG TB24 tablet Take 1 tablet (25 mg total) by mouth daily.   montelukast (SINGULAIR) 10 MG tablet TAKE 1 TABLET(10 MG) BY MOUTH AT BEDTIME   Multiple Vitamin (MULTIVITAMIN) capsule Take 1 capsule by mouth daily.   nystatin powder APPLY EXTERNALLY TO THE AFFECTED AREA TWICE DAILY   omeprazole (PRILOSEC) 20 MG capsule Take 20 mg by mouth daily.   predniSONE (DELTASONE) 5 MG tablet TAKE 1 TABLET(5 MG) BY MOUTH DAILY WITH BREAKFAST   Immunization History  Administered Date(s) Administered   Fluad Quad(high Dose 65+) 09/03/2020, 08/02/2021   Influenza,inj,Quad PF,6+ Mos 08/20/2015, 07/22/2016, 08/14/2017, 08/23/2018, 08/29/2019   Influenza-Unspecified 08/14/2014   PFIZER(Purple Top)SARS-COV-2 Vaccination 01/07/2020, 01/28/2020, 10/11/2020   Pneumococcal Conjugate-13 08/20/2015   Pneumococcal Polysaccharide-23 08/29/2019  Objective:   Physical Exam BP 120/78 (BP Location: Left Arm, Patient Position: Sitting, Cuff Size: Normal)   Pulse 80   Temp 97.9 F (36.6 C) (Oral)   Ht '5\' 2"'  (1.575 m)   Wt 184 lb 3.2 oz (83.6 kg)   SpO2 97%   BMI 33.69 kg/m  GENERAL: Overweight elderly female, quite spry, well-groomed.  Fully ambulatory today with assistance of a cane. No conversational dyspnea. No distress. HEAD: Normocephalic, atraumatic. EYES: Pupils equal, round, reactive to light.  No scleral icterus. NOSE: She has erythema and engorgement of the turbinates on the left nostril.  Some minimal scabbing. MOUTH: Intact dentition. NECK: Supple. No thyromegaly. Trachea midline. No JVD.  No adenopathy. PULMONARY: Good air entry bilaterally. Coarse with no other adventitious sounds. CARDIOVASCULAR: S1 and S2. Regular rate and rhythm. Soft grade 1/6 systolic ejection murmur left sternal border. No rubs or gallops noted. ABDOMEN:  Obese,nondistended. MUSCULOSKELETAL: No joint deformity, no clubbing, no edema.  She has increased AP diameter due to kyphosis.  There is tenderness along the lower right chest wall with no crepitus. NEUROLOGIC: No overt focal deficits noted. Speech is fluent. SKIN: Intact,warm,dry. On limited exam no rashes. PSYCH: Mood and behavior normal.     Assessment & Plan:     ICD-10-CM   1. Stage 2 moderate COPD by GOLD classification ([place])  J44.9    Continue Breo and as needed albuterol Referred to pharmacy team to see what cheaper alternatives to [facility] are there    2. Nocturnal hypoxemia due to obstructive chronic bronchitis (HCC)  J44.9 Ambulatory Referral for DME   G47.36    Continue oxygen at 2 L/min Patient compliant with therapy and notes benefit Will add humidifier bottle to reduce nasal irritation    3. Recurrent rhinosinusitis  J32.9    Bactroban ointment to left nostril twice daily x5 days Doxycycline 100 mg twice daily x7 days     Orders Placed This Encounter  Procedures   Ambulatory Referral for DME    Referral Priority:   Routine    Referral Type:   Durable Medical Equipment Purchase    Number of Visits Requested:   1   Meds ordered this encounter  Medications   mupirocin ointment (BACTROBAN) 2 %    Sig: Apply 1 Application topically 2 (two) times daily. To left nostril for 5 days    Dispense:  22 g    Refill:  0   doxycycline (VIBRA-TABS) 100 MG tablet    Sig: Take 1 tablet (100 mg total) by mouth 2 (two) times daily for 7 days.    Dispense:  14 tablet    Refill:  0   Recommended to the patient that she get both influenza and RSV vaccines.  She can get these through her pharmacy which is [facility].  We will see the patient in follow-up in 2 to 3 months time, she is to contact us prior to that time for any new difficulties arise.  Renold Don, MD Advanced Bronchoscopy PCCM [facility]    *This note was dictated using voice recognition  software/Dragon.  Despite best efforts to proofread, errors can occur which can change the meaning. Any transcriptional errors that result from this process are unintentional and may not be fully corrected at the time of dictation.

## 2022-07-29 NOTE — Telephone Encounter (Signed)
Test billing for this patient's plan returns the following results for ICS/LABA products:   advair $5.00 advair hfa  $193.72 dulera $30.90 symbicort $5.00

## 2022-07-29 NOTE — Telephone Encounter (Signed)
Patient is currently on Breo, however, it is now not affordable for her.  Dr. Patsey Berthold is unable to find a medication that her insurance will cover.  Pharmacy Team, please assist with finding comparable inhaler to [facility] that is affordable.  Thank you.

## 2022-08-03 NOTE — Telephone Encounter (Signed)
Lets try Advair 250/50, 1 puff twice a day.  Make sure she rinses her mouth well after use.

## 2022-08-03 NOTE — Telephone Encounter (Signed)
Dr. Patsey Berthold,  These are the recommendations from pharmacy.

## 2022-08-04 ENCOUNTER — Ambulatory Visit (INDEPENDENT_AMBULATORY_CARE_PROVIDER_SITE_OTHER): Payer: Medicare Other | Admitting: Family Medicine

## 2022-08-04 VITALS — BP 130/72 | HR 78 | Temp 97.6°F | Resp 18 | Ht 62.0 in | Wt 183.1 lb

## 2022-08-04 DIAGNOSIS — R197 Diarrhea, unspecified: Secondary | ICD-10-CM | POA: Insufficient documentation

## 2022-08-04 DIAGNOSIS — J441 Chronic obstructive pulmonary disease with (acute) exacerbation: Secondary | ICD-10-CM

## 2022-08-04 DIAGNOSIS — J340 Abscess, furuncle and carbuncle of nose: Secondary | ICD-10-CM | POA: Diagnosis not present

## 2022-08-04 MED ORDER — PREDNISONE 20 MG PO TABS: ORAL_TABLET | ORAL | 0 refills | Status: DC

## 2022-08-04 NOTE — Progress Notes (Signed)
Patient ID: Megan Bradshaw, female    DOB: December 06, 1929, 88 y.o.   MRN: [id number]  This visit was conducted in person.  BP 130/72   Pulse 78   Temp 97.6 F (36.4 C)   Resp 18   Ht _0  (1.575 m)   Wt 183 lb 2 oz (83.1 kg)   SpO2 96%   BMI 33.49 kg/m    CC:  Chief Complaint  Patient presents with   Abdominal Pain    X about 1 week, stomach has not felt well in the last week, not sleeping well, has had some diarrhea. She did just finish antibiotic from pulmonologist-Doxycycline    Subjective:   HPI: Megan Bradshaw is a 88 y.o. female history of COPD presenting on 08/04/2022 for Abdominal Pain (X about 1 week, stomach has not felt well in the last week, not sleeping well, has had some diarrhea. She did just finish antibiotic from pulmonologist-Doxycycline)  She was recently treated by pulmonary for COPD  and recurrent sinusitis with a course of doxycycline x7 days. Reviewed pulmonary note from July 29, 2022.  Started on oxygen [place] at night.... drying out mouth and causing soreness in left nare... now has added water and has been applying mupirocin BID to the area... starting to improve.  In the last week, she has not been feeling well, not sleeping well and noted  intermittent diarrhea and fatigue. She has been less hungry.  This AM she has 4 BMs... noted small amount of blood after straining. Watery BM, softer.  Low abd pain .Marland Kitchen Resolved after BMs.  She has noted SOB is worsening again. No further nasal discharge... less now since antibiotics.   No fever.       Relevant past medical, surgical, family and social history reviewed and updated as indicated. Interim medical history since our last visit reviewed. Allergies and medications reviewed and updated. Outpatient Medications Prior to Visit  Medication Sig Dispense Refill   ACCU-CHEK GUIDE test strip TEST TWICE DAILY 200 strip 1   Accu-Chek Softclix Lancets lancets Use to check blood sugar 2 times a day 100 each  5   albuterol (PROVENTIL) (2.5 MG/3ML) 0.083% nebulizer solution Take 3 mLs (2.5 mg total) by nebulization every 4 (four) hours as needed for wheezing or shortness of breath. 150 mL 5   albuterol (VENTOLIN HFA) 108 (90 Base) MCG/ACT inhaler INHALE 1 TO 2 PUFFS BY MOUTH EVERY 6 HOURS FOR PAIN OR WHEEZING OR SHORTNESS OF BREATH 18 g 2   B Complex-Folic Acid (BENFOTIAMINE MULTI-B) CAPS Take 1 capsule by mouth daily. 30 capsule 6   Blood Glucose Monitoring Suppl (ACCU-CHEK GUIDE ME) w/Device KIT See admin instructions.     BREO ELLIPTA 100-25 MCG/ACT AEPB USE 1 INHALATION ORALLY    DAILY 180 each 2   Cholecalciferol (VITAMIN D PO) Take 1 tablet by mouth daily.     clobetasol cream (TEMOVATE) 2.77 % Apply 1 application topically daily as needed. 30 g 1   dextromethorphan (DELSYM) 30 MG/5ML liquid Take 30 mg by mouth as needed for cough.     gabapentin (NEURONTIN) 100 MG capsule Take 1 capsule (100 mg total) by mouth 3 (three) times daily. 180 capsule 3   glipiZIDE (GLUCOTROL XL) 10 MG 24 hr tablet TAKE 1 TABLET(10 MG) BY MOUTH DAILY WITH BREAKFAST 90 tablet 0   ipratropium-albuterol (DUONEB) 0.5-2.5 (3) MG/3ML SOLN USE 3 ML VIA NEBULIZER EVERY 6 HOURS AS NEEDED 360 mL 1   levothyroxine (SYNTHROID) 75  MCG tablet Take 1 tablet (75 mcg total) by mouth daily before breakfast. 90 tablet 3   metFORMIN (GLUCOPHAGE) 500 MG tablet Take 1 tablet (500 mg total) by mouth 2 (two) times daily with a meal. 180 tablet 3   mirabegron ER (MYRBETRIQ) 25 MG TB24 tablet Take 1 tablet (25 mg total) by mouth daily. 30 tablet 11   montelukast (SINGULAIR) 10 MG tablet TAKE 1 TABLET(10 MG) BY MOUTH AT BEDTIME 30 tablet 3   Multiple Vitamin (MULTIVITAMIN) capsule Take 1 capsule by mouth daily.     mupirocin ointment (BACTROBAN) 2 % Apply 1 Application topically 2 (two) times daily. To left nostril for 5 days 22 g 0   nystatin powder APPLY EXTERNALLY TO THE AFFECTED AREA TWICE DAILY 30 g 1   omeprazole (PRILOSEC) 20 MG capsule  Take 20 mg by mouth daily.     predniSONE (DELTASONE) 5 MG tablet TAKE 1 TABLET(5 MG) BY MOUTH DAILY WITH BREAKFAST 90 tablet 0   amoxicillin (AMOXIL) 500 MG capsule Take 2 capsules (1,000 mg total) by mouth 2 (two) times daily. 40 capsule 0   doxycycline (VIBRA-TABS) 100 MG tablet Take 1 tablet (100 mg total) by mouth 2 (two) times daily for 7 days. 14 tablet 0   No facility-administered medications prior to visit.     Per HPI unless specifically indicated in ROS section below Review of Systems  Constitutional:  Negative for fatigue and fever.  HENT:  Negative for congestion.   Eyes:  Negative for pain.  Respiratory:  Negative for cough and shortness of breath.   Cardiovascular:  Negative for chest pain, palpitations and leg swelling.  Gastrointestinal:  Negative for abdominal pain.  Genitourinary:  Negative for dysuria and vaginal bleeding.  Musculoskeletal:  Negative for back pain.  Neurological:  Negative for syncope, light-headedness and headaches.  Psychiatric/Behavioral:  Negative for dysphoric mood.    Objective:  BP 130/72   Pulse 78   Temp 97.6 F (36.4 C)   Resp 18   Ht _0  (1.575 m)   Wt 183 lb 2 oz (83.1 kg)   SpO2 96%   BMI 33.49 kg/m   Wt Readings from Last 3 Encounters:  08/04/22 183 lb 2 oz (83.1 kg)  07/29/22 184 lb 3.2 oz (83.6 kg)  06/10/22 186 lb 2 oz (84.4 kg)      Physical Exam Constitutional:      General: She is not in acute distress.    Appearance: Normal appearance. She is well-developed. She is not ill-appearing or toxic-appearing.  HENT:     Head: Normocephalic.     Comments: Healing ulcer in left nares    Right Ear: Hearing, tympanic membrane, ear canal and external ear normal. Tympanic membrane is not erythematous, retracted or bulging.     Left Ear: Hearing, tympanic membrane, ear canal and external ear normal. Tympanic membrane is not erythematous, retracted or bulging.     Nose: No mucosal edema or rhinorrhea.     Right Sinus: No  maxillary sinus tenderness or frontal sinus tenderness.     Left Sinus: No maxillary sinus tenderness or frontal sinus tenderness.     Mouth/Throat:     Pharynx: Uvula midline.  Eyes:     General: Lids are normal. Lids are everted, no foreign bodies appreciated.     Conjunctiva/sclera: Conjunctivae normal.     Pupils: Pupils are equal, round, and reactive to light.  Neck:     Thyroid: No thyroid mass or thyromegaly.  Vascular: No carotid bruit.     Trachea: Trachea normal.  Cardiovascular:     Rate and Rhythm: Normal rate and regular rhythm.     Pulses: Normal pulses.     Heart sounds: Normal heart sounds, S1 normal and S2 normal. No murmur heard.    No friction rub. No gallop.  Pulmonary:     Effort: Pulmonary effort is normal. No tachypnea or respiratory distress.     Breath sounds: Normal breath sounds. Decreased air movement present. No decreased breath sounds, wheezing, rhonchi or rales.  Abdominal:     General: Bowel sounds are normal.     Palpations: Abdomen is soft.     Tenderness: There is no abdominal tenderness.  Musculoskeletal:     Cervical back: Normal range of motion and neck supple.  Skin:    General: Skin is warm and dry.     Findings: No rash.  Neurological:     Mental Status: She is alert.  Psychiatric:        Mood and Affect: Mood is not anxious or depressed.        Speech: Speech normal.        Behavior: Behavior normal. Behavior is cooperative.        Thought Content: Thought content normal.        Judgment: Judgment normal.       Results for orders placed or performed in visit on 06/27/22  ECHOCARDIOGRAM COMPLETE  Result Value Ref Range   AR max vel 2.65 cm2   AV Peak grad 5.1 mmHg   Ao pk vel 1.13 m/s   Area-P 1/2 2.08 cm2   AV Area VTI 2.98 cm2   AV Mean grad 3.0 mmHg   AV Area mean vel 2.63 cm2     COVID 19 screen:  No recent travel or known exposure to [place] The patient denies respiratory symptoms of COVID 19 at this time. The  importance of social distancing was discussed today.   Assessment and Plan    Problem List Items Addressed This Visit     Acute diarrhea     Acute She has had some diarrhea associated with antibiotic use.  She will start a probiotic.  Now that she has done the antibiotic we will have her push fluids and follow.  If it is not improving as expected we can  consider evaluation for C. difficile.      COPD exacerbation ([place]) - Primary    No clear sign of ongoing bacterial infection but worsening COPD.  She states her current respiratory regimen is not returning her to baseline.  She will temporarily do an increased dose of prednisone via taper and then will return to prednisone 5 mg p.o. daily. She will continue nasal saline irrigation      Relevant Medications   predniSONE (DELTASONE) 20 MG tablet   Nasal ulcer    Acute, improving Continue to apply topical mupirocin ointment twice daily      Meds ordered this encounter  Medications   predniSONE (DELTASONE) 20 MG tablet    Sig: 3 tabs by mouth daily x 3 days, then 2 tabs by mouth daily x 2 days then 1 tab by mouth daily x 2 days    Dispense:  15 tablet    Refill:  0     Eliezer Lofts, MD

## 2022-08-04 NOTE — Telephone Encounter (Signed)
Spoke to patient and relayed below message/recommendations. She stated that Advair, Dulera and Symbicort was not effective for her previously.  She stated that she would like to continue with [facility] for now. Dr. Patsey Berthold has been made aware verbally.  Nothing further needed.

## 2022-08-04 NOTE — Assessment & Plan Note (Signed)
Acute She has had some diarrhea associated with antibiotic use.  She will start a probiotic.  Now that she has done the antibiotic we will have her push fluids and follow.  If it is not improving as expected we can  consider evaluation for C. difficile.

## 2022-08-04 NOTE — Patient Instructions (Signed)
Complete prednisone taper, then return to daily dose of prednisone 5 mg daily.  Start back nasal saline spray twice daily. Continue to apply mupirocin ointment to nasal sore twice daily.  Start probiotic daily.  Call if diarrhea not improving off of antibiotics.. for possible further testing with stool sample

## 2022-08-04 NOTE — Assessment & Plan Note (Signed)
Acute, improving Continue to apply topical mupirocin ointment twice daily

## 2022-08-04 NOTE — Assessment & Plan Note (Signed)
No clear sign of ongoing bacterial infection but worsening COPD.  She states her current respiratory regimen is not returning her to baseline.  She will temporarily do an increased dose of prednisone via taper and then will return to prednisone 5 mg p.o. daily. She will continue nasal saline irrigation

## 2022-08-31 ENCOUNTER — Telehealth: Payer: Self-pay | Admitting: *Deleted

## 2022-08-31 NOTE — Chronic Care Management (AMB) (Signed)
  Care Coordination  Outreach Note  08/31/2022 Name: Megan Bradshaw MRN: [id number] DOB: 1930/05/07   Care Coordination Outreach Attempts: An unsuccessful telephone outreach was attempted today to offer the patient information about available care coordination services as a benefit of their health plan.   Follow Up Plan:  Additional outreach attempts will be made to offer the patient care coordination information and services.   Encounter Outcome:  No Answer  Julian Hy, [place] Direct Dial: [phone]

## 2022-09-20 ENCOUNTER — Other Ambulatory Visit: Payer: Self-pay | Admitting: Family Medicine

## 2022-09-20 NOTE — Telephone Encounter (Signed)
Pt stated she visited her local pharmacy, to pick up her cream, clobetasol cream (TEMOVATE) 0.05 % & pharmacy stated the cream is old & not available anymore. Pt is requesting for some other type of prescription be called in to substitute the cream? Preferred pharmacy is [address], [place]. Call back # [phone]

## 2022-09-20 NOTE — Telephone Encounter (Signed)
Spoke with [facility].  They just need a new Rx.  The previous one is no longer on file .  Last refilled 12/16/2020 for 30 g with 1 refill by Webb Silversmith.    If okay to refill, please sign order below.

## 2022-09-20 NOTE — Progress Notes (Signed)
  Care Coordination  Outreach Note  09/20/2022 Name: Megan Bradshaw MRN: [id number] DOB: [birth year]   Care Coordination Outreach Attempts: A second unsuccessful outreach was attempted today to offer the patient with information about available care coordination services as a benefit of their health plan.     Follow Up Plan:  Additional outreach attempts will be made to offer the patient care coordination information and services.   Encounter Outcome:  No Answer  Julian Hy, [place] Direct Dial: [phone]

## 2022-09-20 NOTE — Telephone Encounter (Signed)
Please call pharmacy and find out what the issue is.  Does she need a new prescription for clobetasol?

## 2022-09-21 MED ORDER — CLOBETASOL PROPIONATE 0.05 % EX CREA: 1.0000 | TOPICAL_CREAM | Freq: Every day | CUTANEOUS | 1 refills | Status: AC | PRN

## 2022-09-23 ENCOUNTER — Telehealth: Payer: Self-pay | Admitting: Family Medicine

## 2022-09-23 NOTE — Telephone Encounter (Signed)
[facility] - Client TELEPHONE ADVICE RECORD [facility]urse Patient Name: Megan Bradshaw Gender: Female DOB: [birth year] Age: 88 Y 2 M 24 D Return Phone Number: [phone] (Secondary) Address: City/ State/ Zip: [place] [address] Client [facility] - Client Client Site [place] - Day Provider Eliezer Lofts - MD Contact Type Call Who Is Calling Patient / Member / Family / Caregiver Call Type Triage / Clinical Relationship To Patient Self Return Phone Number [phone] (Secondary) Chief Complaint BREATHING - shortness of breath or sounds breathless Reason for Call Symptomatic / Request for [place] states that she has drainage going down in her chest and she is having trouble breathing. She had COPD. Translation No Nurse Assessment Nurse: Hassell Done, RN, Joelene Millin Date/Time Eilene Ghazi Time): 09/23/2022 8:33:47 AM Confirm and document reason for call. If symptomatic, describe symptoms. ---caller states she has asthma and COPD and believes she has a sinus infection, she c/o sinus pain and pressure. no fever. Does the patient have any new or worsening symptoms? ---Yes Will a triage be completed? ---Yes Related visit to physician within the last 2 weeks? ---No Does the PT have any chronic conditions? (i.e. diabetes, asthma, this includes High risk factors for pregnancy, etc.) ---Yes List chronic conditions. ---asthma, COPD Is this a behavioral health or substance abuse call? ---No Guidelines Guideline Title Affirmed Question Affirmed Notes Nurse Date/Time Eilene Ghazi Time) Sinus Pain or Congestion [1] Difficulty breathing AND [2] not from stuffy nose (e.g., not relieved by cleaning out the nose) Hassell Done, RN, Joelene Millin 09/23/2022 8:36:12 AM Disp. Time Eilene Ghazi Time) Disposition Final User 09/23/2022 8:13:59 AM Send to Urgent Jennelle Human, Santiago Glad PLEASE NOTE:  All timestamps contained within this report are represented as [place] Standard Time. CONFIDENTIALTY NOTICE: This fax transmission is intended only for the addressee. It contains information that is legally privileged, confidential or otherwise protected from use or disclosure. If you are not the intended recipient, you are strictly prohibited from reviewing, disclosing, copying using or disseminating any of this information or taking any action in reliance on or regarding this information. If you have received this fax in error, please notify us immediately by telephone so that we can arrange for its return to [place]. Phone: [phone], Toll-Free: [phone], Fax: [phone] Page: 2 of 2 Call Id: [id number] [place]. Time Eilene Ghazi Time) Disposition Final User 09/23/2022 8:16:36 AM Attempt made - no message left Alanda Amass 09/23/2022 8:38:44 AM Go to ED Now Yes Hassell Done, RN, Joelene Millin Final Disposition 09/23/2022 8:38:44 AM Go to ED Now Yes Hassell Done, RN, Renea Ee Disagree/Comply Comply Caller Understands Yes PreDisposition Call Doctor Care Advice Given Per Guideline GO TO ED NOW: * You need to be seen in the Emergency Department. * Go to the ED at ___________ [place] now. Drive carefully. ANOTHER ADULT SHOULD DRIVE: * It is better and safer if another adult drives instead of you. BRING MEDICINES: * Bring a list of your current medicines when you go to the Emergency Department (ER). * Bring the pill bottles too. This will help the doctor (or NP/PA) to make certain you are taking the right medicines and the right dose. CARE ADVICE given per Sinus Pain or Congestion (Adult) guideline. Referrals GO TO FACILITY REFUSE

## 2022-09-23 NOTE — Telephone Encounter (Signed)
Patient called in stating she is having trouble breathing and some drainage going into her chest. She stated she have COPD. Sent over to access nurse.

## 2022-09-23 NOTE — Telephone Encounter (Signed)
Per access nurse note pt has agreed to go to ED; sending note to Dr Diona Browner and Butch Penny CMA.

## 2022-09-26 ENCOUNTER — Encounter: Payer: Self-pay | Admitting: Internal Medicine

## 2022-09-26 ENCOUNTER — Ambulatory Visit (INDEPENDENT_AMBULATORY_CARE_PROVIDER_SITE_OTHER): Payer: Medicare Other | Admitting: Internal Medicine

## 2022-09-26 VITALS — BP 118/78 | HR 75 | Temp 97.1°F | Ht 62.0 in | Wt 184.0 lb

## 2022-09-26 DIAGNOSIS — J449 Chronic obstructive pulmonary disease, unspecified: Secondary | ICD-10-CM | POA: Diagnosis not present

## 2022-09-26 DIAGNOSIS — J0100 Acute maxillary sinusitis, unspecified: Secondary | ICD-10-CM | POA: Diagnosis not present

## 2022-09-26 MED ORDER — DOXYCYCLINE HYCLATE 100 MG PO TABS
100.0000 mg | ORAL_TABLET | Freq: Two times a day (BID) | ORAL | 0 refills | Status: DC
Start: 2022-09-26 — End: 2022-12-13

## 2022-09-26 MED ORDER — PREDNISONE 20 MG PO TABS: 40.0000 mg | ORAL_TABLET | Freq: Every day | ORAL | 0 refills | Status: DC

## 2022-09-26 NOTE — Assessment & Plan Note (Signed)
She will use the albuterol Burst of prednisone '40mg'$  daily x 3, the '20mg'$  x 3--then back to usual '5mg'$ 

## 2022-09-26 NOTE — Progress Notes (Signed)
Subjective:    Patient ID: Megan Bradshaw, female    DOB: 1930-10-12, 88 y.o.   MRN: [id number]  HPI Here due to a respiratory infection  Has sinus infection Post nasal drip Affecting her breathing--mostly with exertion Goes back a week---just hasn't gotten better Tried nasal saline No fever Some cough---dry Using albuterol inhaler Is on prednisone 106m daily  COVID test negative---3-4 days into illness  Current Outpatient Medications on File Prior to Visit  Medication Sig Dispense Refill   ACCU-CHEK GUIDE test strip TEST TWICE DAILY 200 strip 1   Accu-Chek Softclix Lancets lancets Use to check blood sugar 2 times a day 100 each 5   albuterol (PROVENTIL) (2.5 MG/3ML) 0.083% nebulizer solution Take 3 mLs (2.5 mg total) by nebulization every 4 (four) hours as needed for wheezing or shortness of breath. 150 mL 5   albuterol (VENTOLIN HFA) 108 (90 Base) MCG/ACT inhaler INHALE 1 TO 2 PUFFS BY MOUTH EVERY 6 HOURS FOR PAIN OR WHEEZING OR SHORTNESS OF BREATH 18 g 2   B Complex-Folic Acid (BENFOTIAMINE MULTI-B) CAPS Take 1 capsule by mouth daily. 30 capsule 6   Blood Glucose Monitoring Suppl (ACCU-CHEK GUIDE ME) w/Device KIT See admin instructions.     BREO ELLIPTA 100-25 MCG/ACT AEPB USE 1 INHALATION ORALLY    DAILY 180 each 2   Cholecalciferol (VITAMIN D PO) Take 1 tablet by mouth daily.     clobetasol cream (TEMOVATE) 00.53% Apply 1 Application topically daily as needed. 30 g 1   dextromethorphan (DELSYM) 30 MG/5ML liquid Take 30 mg by mouth as needed for cough.     gabapentin (NEURONTIN) 100 MG capsule Take 1 capsule (100 mg total) by mouth 3 (three) times daily. 180 capsule 3   glipiZIDE (GLUCOTROL XL) 10 MG 24 hr tablet TAKE 1 TABLET(10 MG) BY MOUTH DAILY WITH BREAKFAST 90 tablet 0   ipratropium-albuterol (DUONEB) 0.5-2.5 (3) MG/3ML SOLN USE 3 ML VIA NEBULIZER EVERY 6 HOURS AS NEEDED 360 mL 1   levothyroxine (SYNTHROID) 75 MCG tablet Take 1 tablet (75 mcg total) by mouth daily before  breakfast. 90 tablet 3   metFORMIN (GLUCOPHAGE) 500 MG tablet Take 1 tablet (500 mg total) by mouth 2 (two) times daily with a meal. 180 tablet 3   mirabegron ER (MYRBETRIQ) 25 MG TB24 tablet Take 1 tablet (25 mg total) by mouth daily. 30 tablet 11   montelukast (SINGULAIR) 10 MG tablet TAKE 1 TABLET(10 MG) BY MOUTH AT BEDTIME 30 tablet 3   Multiple Vitamin (MULTIVITAMIN) capsule Take 1 capsule by mouth daily.     mupirocin ointment (BACTROBAN) 2 % Apply 1 Application topically 2 (two) times daily. To left nostril for 5 days 22 g 0   nystatin powder APPLY EXTERNALLY TO THE AFFECTED AREA TWICE DAILY 30 g 1   omeprazole (PRILOSEC) 20 MG capsule Take 20 mg by mouth daily.     predniSONE (DELTASONE) 5 MG tablet TAKE 1 TABLET(5 MG) BY MOUTH DAILY WITH BREAKFAST 90 tablet 0   No current facility-administered medications on file prior to visit.    Allergies  Allergen Reactions   Augmentin [Amoxicillin-Pot Clavulanate] Diarrhea    Past Medical History:  Diagnosis Date   Arthritis    Asthma    COPD (chronic obstructive pulmonary disease) ([place]    Diabetes ([place]    Thyroid disease     Past Surgical History:  Procedure Laterality Date   ABDOMINAL HYSTERECTOMY  1984   benign, TAH.   APPENDECTOMY  1939  CATARACT EXTRACTION, BILATERAL     CHOLECYSTECTOMY  1987   NASAL SINUS SURGERY  1990   REPLACEMENT TOTAL KNEE Right 2007    Family History  Problem Relation Age of Onset   Arthritis Father    Diabetes Sister    Diabetes Daughter    Cancer Neg Hx    Heart disease Neg Hx    Stroke Neg Hx     Social History   Socioeconomic History   Marital status: Widowed    Spouse name: Not on file   Number of children: 4   Years of education: Not on file   Highest education level: Not on file  Occupational History   Occupation: Retired  Tobacco Use   Smoking status: Former    Packs/day: 1.50    Years: 30.00    Total pack years: 45.00    Types: Cigarettes    Quit date: 11/15/1987     Years since quitting: 34.8   Smokeless tobacco: Never  Vaping Use   Vaping Use: Never used  Substance and Sexual Activity   Alcohol use: No    Alcohol/week: 0.0 standard drinks of alcohol   Drug use: No   Sexual activity: Not on file  Other Topics Concern   Not on file  Social History Narrative   Not on file   Social Determinants of Health   Financial Resource Strain: Not on file  Food Insecurity: Not on file  Transportation Needs: Not on file  Physical Activity: Not on file  Stress: Not on file  Social Connections: Not on file  Intimate Partner Violence: Not on file   Review of Systems Uses the oxygen at night No N/V Appetite is off but eating some    Objective:   Physical Exam Constitutional:      Appearance: Normal appearance.  HENT:     Head:     Comments: Mild maxillary tenderness    Right Ear: Tympanic membrane and ear canal normal.     Left Ear: Tympanic membrane and ear canal normal.     Nose: No congestion.     Mouth/Throat:     Pharynx: No oropharyngeal exudate or posterior oropharyngeal erythema.  Pulmonary:     Effort: Pulmonary effort is normal.     Breath sounds: No wheezing or rales.     Comments: Decreased breath sounds but clear Musculoskeletal:     Cervical back: Neck supple.  Lymphadenopathy:     Cervical: No cervical adenopathy.  Neurological:     Mental Status: She is alert.            Assessment & Plan:

## 2022-09-26 NOTE — Assessment & Plan Note (Signed)
Going on a week and not clearing Will treat with doxy 100 bid x 7 days

## 2022-10-14 NOTE — Progress Notes (Signed)
  Care Coordination  Outreach Note  10/14/2022 Name: Megan Bradshaw MRN: [id number] DOB: 1930-06-20   Care Coordination Outreach Attempts: A third unsuccessful outreach was attempted today to offer the patient with information about available care coordination services as a benefit of their health plan.   Follow Up Plan:  No further outreach attempts will be made at this time. We have been unable to contact the patient to offer or enroll patient in care coordination services  Encounter Outcome:  No Answer  Julian Hy, [place] Direct Dial: [phone]

## 2022-10-28 ENCOUNTER — Ambulatory Visit (INDEPENDENT_AMBULATORY_CARE_PROVIDER_SITE_OTHER): Payer: Medicare Other | Admitting: Pulmonary Disease

## 2022-10-28 ENCOUNTER — Encounter: Payer: Self-pay | Admitting: Pulmonary Disease

## 2022-10-28 VITALS — BP 138/82 | HR 83 | Temp 97.3°F | Ht 62.0 in | Wt 184.0 lb

## 2022-10-28 DIAGNOSIS — R0602 Shortness of breath: Secondary | ICD-10-CM | POA: Diagnosis not present

## 2022-10-28 DIAGNOSIS — G4736 Sleep related hypoventilation in conditions classified elsewhere: Secondary | ICD-10-CM | POA: Diagnosis not present

## 2022-10-28 DIAGNOSIS — J449 Chronic obstructive pulmonary disease, unspecified: Secondary | ICD-10-CM

## 2022-10-28 DIAGNOSIS — J4489 Other specified chronic obstructive pulmonary disease: Secondary | ICD-10-CM

## 2022-10-28 NOTE — Patient Instructions (Signed)
Your lungs sounded really well today.  Continue your medications as you are doing now.  Will see you in follow-up in 4 to 6 months time call sooner should any new problems arise.

## 2022-10-28 NOTE — Progress Notes (Signed)
Subjective:    Patient ID: Megan Bradshaw, female    DOB: May 30, 1930, 88 y.o.   MRN: [id number] Patient Care Team: Jinny Sanders, MD as PCP - General (Family Medicine) Debbora Dus, [facility] as Pharmacist (Pharmacist) Tyler Pita, MD as Consulting Physician (Pulmonary Disease)  Chief Complaint  Patient presents with   Follow-up    SOB. No wheezing or cough.   Pt profile: 88  y.o. F, former smoker, moved from [place] in 2015. Initially seen by Dr Melvyn Novas. Diagnosed with COPD in 2011. Graded as Gold I by Dr.  Melvyn Novas. Chronic prednisone therapy for non-pulmonary diagnosis.  Last PFTs 02/16/2016.  Followed by Dr. Alva Garnet since October 2016. Established with me in November 2020 after Dr. Alva Garnet' departure from the practice.   PROBLEMS: Gold II COPD with chronic asthmatic bronchitis Chronic refractory cough Class III dyspnea out of proportion to PFT parameters   DATA: 10/22/14 PFTs: mild obstruction, FEV1 1.36 L (81%), FEV1/FVC 53%, TLC normal, DLCO 51% pred 02/16/16 PFTs: FVC: 2.23 L (90 %pred), FEV1: 1.30 L (71 %pred), FEV1/FVC: 58% , TLC: invalid, DLCO 78% pred, consistent with mild/moderate obstructive defect 10/14/2019 2D echo: LVEF 50 to 46%, normal diastolics, normal pulmonary artery systolic pressure 56/81/2751 overnight oximetry on RA: No oxygen desaturations of clinical significance. Lowest O2 sat 88% of less than 2 minutes duration.  Average O2 sat 93% 04/28/2022 overnight oximetry on RA: Patient qualified for nocturnal oxygen.  Basal SPO2 was 91% nadir to 87% total of 216 oxygen desaturation events, ODI of 21 06/02/2022 PFTs: FEV1 1.12 L or 78% predicted, FVC is 2.15 L or 110% predicted.  FEV1/FVC 52%, no bronchodilator response.  Lung volumes were normal.  Diffusion capacity moderately reduced.  Consistent with moderately severe obstructive airways disease, restriction probable likely due to obesity.  Moderately severe diffusion defect/abnormality.  Since the prior study of  02/16/19/2017 there was a decrease in FEV1 by 180 mL.   INTERVAL: Last seen in this office in September 2023 by me.  No new issues in the interval.  Dyspnea at baseline or slightly better. Compliant with nocturnal O2 Notes improvement on symptoms on therapy.  HPI Megan Bradshaw is an 88 year old former smoker (34 PY) who presents for follow-up on the issue of dyspnea and GOLD class II COPD.  This is a scheduled visit she was last seen on 29 July 2022 by me.  She believes dyspnea is more manageable lately.  She has been compliant with overnight oxygen and notices feeling refreshed in the morning and less fatigue during the day, this in turn has helped her dyspnea. The patient has been compliant with Breo Ellipta.  In the past she has tried Trelegy however finds that this does not offer any relief over Breo.  She has not had any cough or sputum production.  No recent sinus issues she has been sleeping well when she is able to wear the oxygen.  No nocturnal awakenings of late.  She has not needed rescue inhalation of albuterol lately.   Does not have any fevers, chills or sweats. No hemoptysis.She does not endorse any other new symptomatology today.  She appears spry and energetic today.    Review of Systems A 10 point review of systems was performed and it is as noted above otherwise negative.  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) ([place])  09/22/2014   Hypothyroid 09/22/2014   COPD mixed type ([place]) 08/12/2014   Social History   Tobacco Use   Smoking status: Former    Packs/day: 1.50    Years: 30.00    Total pack years: 45.00    Types: Cigarettes    Quit date: 11/15/1987    Years since quitting: 34.9   Smokeless tobacco: Never  Substance Use Topics   Alcohol use: No    Alcohol/week: 0.0 standard drinks of alcohol   Allergies  Allergen Reactions   Augmentin [Amoxicillin-Pot Clavulanate] Diarrhea   Current Meds  Medication Sig   ACCU-CHEK GUIDE test strip TEST TWICE DAILY   Accu-Chek Softclix Lancets lancets Use to check blood sugar 2 times a day   albuterol (PROVENTIL) (2.5 MG/3ML) 0.083% nebulizer solution Take 3 mLs (2.5 mg total) by nebulization every 4 (four) hours as needed for wheezing or shortness of breath.   albuterol (VENTOLIN HFA) 108 (90 Base) MCG/ACT inhaler INHALE 1 TO 2 PUFFS BY MOUTH EVERY 6 HOURS FOR PAIN OR WHEEZING OR SHORTNESS OF BREATH   B Complex-Folic Acid (BENFOTIAMINE MULTI-B) CAPS Take 1 capsule by mouth daily.   Blood Glucose Monitoring Suppl (ACCU-CHEK GUIDE ME) w/Device KIT See admin instructions.   BREO ELLIPTA 100-25 MCG/ACT AEPB USE 1 INHALATION ORALLY    DAILY   Cholecalciferol (VITAMIN D PO) Take 1 tablet by mouth daily.   clobetasol cream (TEMOVATE) 2.45 % Apply 1 Application topically daily as needed.   dextromethorphan (DELSYM) 30 MG/5ML liquid Take 30 mg by mouth as needed for cough.   gabapentin (NEURONTIN) 100 MG capsule Take 1 capsule (100 mg total) by mouth 3 (three) times daily.   glipiZIDE (GLUCOTROL XL) 10 MG 24 hr tablet TAKE 1 TABLET(10 MG) BY MOUTH DAILY WITH BREAKFAST   ipratropium-albuterol (DUONEB) 0.5-2.5 (3) MG/3ML SOLN USE 3 ML VIA NEBULIZER EVERY 6 HOURS AS NEEDED   levothyroxine (SYNTHROID) 75 MCG tablet Take 1 tablet (75 mcg total) by mouth daily before breakfast.   metFORMIN (GLUCOPHAGE) 500 MG tablet Take 1 tablet (500 mg total) by mouth 2 (two) times daily  with a meal.   mirabegron ER (MYRBETRIQ) 25 MG TB24 tablet Take 1 tablet (25 mg total) by mouth daily.   montelukast (SINGULAIR) 10 MG tablet TAKE 1 TABLET(10 MG) BY MOUTH AT BEDTIME   Multiple Vitamin (MULTIVITAMIN) capsule Take 1 capsule by mouth daily.   mupirocin ointment (BACTROBAN) 2 % Apply 1 Application topically 2 (two) times daily. To left nostril for 5 days   nystatin powder APPLY EXTERNALLY TO THE AFFECTED AREA TWICE DAILY   predniSONE (DELTASONE) 5 MG tablet TAKE 1 TABLET(5 MG) BY MOUTH DAILY WITH BREAKFAST   Immunization History  Administered Date(s) Administered   Fluad Quad(high Dose 65+) 09/03/2020, 08/02/2021, 08/14/2022   Influenza,inj,Quad PF,6+ Mos 08/20/2015, 07/22/2016, 08/14/2017, 08/23/2018, 08/29/2019   Influenza-Unspecified 08/14/2014   PFIZER(Purple Top)SARS-COV-2 Vaccination 01/07/2020, 01/28/2020, 10/11/2020   Pneumococcal Conjugate-13 08/20/2015   Pneumococcal Polysaccharide-23 08/29/2019   Respiratory Syncytial Virus Vaccine,Recomb Aduvanted(Arexvy) 08/14/2022       Objective:   Physical Exam BP 138/82 (BP Location: Left Arm, Cuff Size: Large)  Pulse 83   Temp (!) 97.3 F (36.3 C)   Ht _0  (1.575 m)   Wt 184 lb (83.5 kg) Comment: last recoreded weight. pt in a wheelchair  SpO2 98%   BMI 33.65 kg/m   SpO2: 98 % O2 Device: None (Room air)  GENERAL: Overweight elderly female, quite spry, well-groomed.  Presents in transport chair today. No conversational dyspnea. No distress. HEAD: Normocephalic, atraumatic. EYES: Pupils equal, round, reactive to light.  No scleral icterus. NOSE: No turbinate edema  MOUTH: Lower teeth in poor repair. NECK: Supple. No thyromegaly. Trachea midline. No JVD.  No adenopathy. PULMONARY: Good air entry bilaterally. Coarse with no other adventitious sounds. CARDIOVASCULAR: S1 and S2. Regular rate and rhythm. Soft grade 1/6 systolic ejection murmur left sternal border. No rubs or gallops noted. ABDOMEN:  Obese,nondistended. MUSCULOSKELETAL: No joint deformity, no clubbing, no edema.  She has increased AP diameter due to kyphosis.  No chest wall tenderness. NEUROLOGIC: No overt focal deficits noted. Speech is fluent. SKIN: Intact,warm,dry. On limited exam no rashes. PSYCH: Mood and behavior normal.     Assessment & Plan:     ICD-10-CM   1. Stage 2 moderate COPD by GOLD classification (HCC)  J44.9    Continue Breo Ellipta 1 inhalation daily Continue as needed albuterol Appears to be well compensated    2. Nocturnal hypoxemia due to obstructive chronic bronchitis  J44.89    G47.36    Continue oxygen at 2 L/min nocturnally Patient compliant and notes benefit of therapy    3. SOB (shortness of breath)  R06.02    Improved of late Fatigue improved with use of nocturnal oxygen     Patient appears to be well compensated.  We will see the patient in follow-up in 4 to 6 months time she is to contact us prior to that time should any new difficulties arise.  Renold Don, MD Advanced Bronchoscopy PCCM [facility]    *This note was dictated using voice recognition software/Dragon.  Despite best efforts to proofread, errors can occur which can change the meaning. Any transcriptional errors that result from this process are unintentional and may not be fully corrected at the time of dictation.

## 2022-11-15 ENCOUNTER — Other Ambulatory Visit: Payer: Self-pay | Admitting: Family Medicine

## 2022-11-16 NOTE — Telephone Encounter (Signed)
Refill request Prednisone 5 mg Last refill 07/28/22 #90 Last office visit 11/138/23 acute

## 2022-11-17 ENCOUNTER — Telehealth: Payer: Self-pay | Admitting: Family Medicine

## 2022-11-17 MED ORDER — PREDNISONE 5 MG PO TABS: 5.0000 mg | ORAL_TABLET | Freq: Every day | ORAL | 3 refills | Status: DC

## 2022-11-17 NOTE — Telephone Encounter (Signed)
Prescription Request  11/17/2022  Is this a "Controlled Substance" medicine? No  LOV: 08/04/2022  What is the name of the medication or equipment?  predniSONE (DELTASONE) 20 MG tablet   90 day supply   Have you contacted your pharmacy to request a refill? Yes   Which pharmacy would you like this sent to?  [id number] - [place], [place] - [address] [place] [address] Phone: [phone] Fax: [phone]     Patient notified that their request is being sent to the clinical staff for review and that they should receive a response within 2 business days.   Please advise at Mobile [phone] (mobile)

## 2022-11-17 NOTE — Addendum Note (Signed)
Addended by: Eliezer Lofts E on: 11/17/2022 12:44 PM   Modules accepted: Orders

## 2022-11-30 ENCOUNTER — Other Ambulatory Visit: Payer: Self-pay

## 2022-11-30 ENCOUNTER — Telehealth: Payer: Self-pay | Admitting: Family Medicine

## 2022-11-30 DIAGNOSIS — J441 Chronic obstructive pulmonary disease with (acute) exacerbation: Secondary | ICD-10-CM

## 2022-11-30 MED ORDER — ALBUTEROL SULFATE (2.5 MG/3ML) 0.083% IN NEBU: 2.5000 mg | INHALATION_SOLUTION | RESPIRATORY_TRACT | 5 refills | Status: AC | PRN

## 2022-11-30 NOTE — Telephone Encounter (Signed)
Pt called requesting a call back regarding questions her nebulizer solution. Pt wants to know the name of the meds that are in her nebulizer? Call back # [phone]

## 2022-11-30 NOTE — Telephone Encounter (Signed)
Last visit 08/04/2022 Next No follow up

## 2022-11-30 NOTE — Telephone Encounter (Signed)
Left a Rx refill to Dr. Diona Browner

## 2022-11-30 NOTE — Telephone Encounter (Signed)
Spoke to pt and she stated that she need RX for nebulizer machine. I did informed her that her pulmonologist prescribed it, and she said "that it is not a controlled substance" so she wanted to know if we could fill it.

## 2022-12-12 ENCOUNTER — Telehealth: Payer: Self-pay | Admitting: Family Medicine

## 2022-12-12 NOTE — Telephone Encounter (Signed)
Patient called in stating that she has been experiencing side pain and dizziness since Friday. I sent her to access nurse to be triaged due to her dizziness.

## 2022-12-12 NOTE — Telephone Encounter (Signed)
Noted  

## 2022-12-12 NOTE — Telephone Encounter (Addendum)
I was unable to speak with pt; no answer and no v/m. I tried to contact Deidre Ala or Lattie Haw who is on [facility] and that # is no longer in service. I tried pt contact # and still no answer and no v/m.Sending note to [place] triage and Dr Diona Browner and [place] pool.   [place] Day - Client TELEPHONE ADVICE RECORD [facility] Patient Name: Megan Bradshaw Gender: Female DOB: 1930/05/23 Age: 89 Y 86 M 12 D Return Phone Number: [phone] (Secondary) Address: City/ State/ Zip: [place] [address] Client [facility] Day - Client Client Site [place] - Day Contact Type Call Who Is Calling Patient / Member / Family / Caregiver Call Type Triage / Clinical Relationship To Patient Self Return Phone Number [phone] (Secondary) Chief Complaint Dizziness Reason for Call Symptomatic / Request for [place] states that she has been having side pain on her right side. She has had her appendix out and her gallbladder out. She believes the pain is a muscle spasm. She also explained that she has a feeling of dizziness. She is not sure if it is because of her medication or not. Her doctors name is Amy, Diona Browner. SHe would also like to make an appt. today for her symsptoms. Translation No No Triage Reason Patient declined Nurse Assessment Nurse: Redmond Pulling, RN, Levada Dy Date/Time Eilene Ghazi Time): 12/12/2022 9:56:08 AM Confirm and document reason for call. If symptomatic, describe symptoms. ---Caller states that she has been having side pain on her right side. She has had her appendix out and her gallbladder out. She believes the pain is a muscle spasm. She also explained that she has a feeling of dizziness. She is not sure if it is because of her medication or not. Her doctors name is Amy, Diona Browner. She would also like to make an appt. today for her symptoms. Upon reaching patient, she states "All I want  is to make an appointment." When asked if I can ask her a few questions, she states, "Yea, go ahead." Before starting triage, she states I just want an appointment, I don't know why I have to speak to people I don't know, I just want an appointment." Refused triage. Advised to call us back if any new or worsening symptoms. Called office to report above content of the call, warm transferred patient to office. Does the patient have any new or worsening symptoms? ---Yes Will a triage be completed? ---No Select reason for no triage. ---Patient declined Please document clinical information provided and list any resource used. ---N/A PLEASE NOTE: All timestamps contained within this report are represented as [place] Standard Time. CONFIDENTIALTY NOTICE: This fax transmission is intended only for the addressee. It contains information that is legally privileged, confidential or otherwise protected from use or disclosure. If you are not the intended recipient, you are strictly prohibited from reviewing, disclosing, copying using or disseminating any of this information or taking any action in reliance on or regarding this information. If you have received this fax in error, please notify us immediately by telephone so that we can arrange for its return to [place]. Phone: [phone], Toll-Free: [phone], Fax: [phone] Page: 2 of 2 Call Id: [id number] [place]. Time Eilene Ghazi Time) Disposition Final User 12/12/2022 10:05:38 AM Clinical Call Yes Redmond Pulling, RN, Levada Dy Final Disposition 12/12/2022 10:05:38 AM Clinical Call Yes Redmond Pulling, RN, Glenard Haring

## 2022-12-12 NOTE — Telephone Encounter (Signed)
Access nurse called back over and stated that patient refused to be triaged. Stated that she only wants to see her provider and not talk to a nurse. Patient scheduled for tomorrow and notified Rena.

## 2022-12-12 NOTE — Telephone Encounter (Signed)
Pt calling back; I spoke with pt to let her know the reason I was calling was there were 2 cancellations with other providers today if pt would like one of those appts and pt declined and said she wants to keep appt with Dr Diona Browner on 12/13/22 and pt does not want to answer a lot of questions; I advised pt I understand and just gave UC & ED precautions and pt thanked me voiced understanding and ended call.

## 2022-12-13 ENCOUNTER — Encounter: Payer: Self-pay | Admitting: Family Medicine

## 2022-12-13 ENCOUNTER — Ambulatory Visit (INDEPENDENT_AMBULATORY_CARE_PROVIDER_SITE_OTHER): Payer: Medicare Other | Admitting: Family Medicine

## 2022-12-13 VITALS — BP 140/72 | HR 83 | Temp 98.0°F | Ht 62.0 in | Wt 188.2 lb

## 2022-12-13 DIAGNOSIS — R42 Dizziness and giddiness: Secondary | ICD-10-CM

## 2022-12-13 DIAGNOSIS — J449 Chronic obstructive pulmonary disease, unspecified: Secondary | ICD-10-CM

## 2022-12-13 DIAGNOSIS — M7918 Myalgia, other site: Secondary | ICD-10-CM | POA: Diagnosis not present

## 2022-12-13 MED ORDER — FLUTICASONE PROPIONATE 50 MCG/ACT NA SUSP: 2.0000 | Freq: Every day | NASAL | 2 refills | Status: DC

## 2022-12-13 NOTE — Assessment & Plan Note (Signed)
Acute, gluteal strain versus sciatica.  No evidence of radiation of pain to leg so gluteal strain more likely.  No evidence of rash suggesting shingles.  Will treat with gentle range of motion stretching and buttock and can use Tylenol as needed for pain.

## 2022-12-13 NOTE — Assessment & Plan Note (Signed)
Acute,   Most likely  due to either eustachian tube dysfunction vs BPPV. Given left face pressure and feeling of pressure in left ear there may be some swelling and inflammation in the left sinus and left TM possibly related to sleeping on the left face wearing nasal cannula.  No clear sign of bacterial infection.  Will treat with Flonase 2 sprays per nostril daily.  If symptoms not improving can consider increase in prednisone temporarily.  If any fever or latent infection can consider antibiotic.  Alternatively his symptoms could be secondary to BPV given balance symptoms, mild vertigo.  Given home desensitization exercises.

## 2022-12-13 NOTE — Progress Notes (Signed)
Patient ID: Megan Bradshaw, female    DOB: Nov 08, 1930, 89 y.o.   MRN: [id number]  This visit was conducted in person.  BP (!) 140/72   Pulse 83   Temp 98 F (36.7 C) (Oral)   Ht '5\' 2"'$  (1.575 m)   Wt 188 lb 4 oz (85.4 kg)   SpO2 98%   BMI 34.43 kg/m    CC:  Chief Complaint  Patient presents with   Dizziness   Hip Pain    Right Buttock   Nasal Congestion   Shortness of Breath        Ear Issue    Left ear "feels like vacuum in it"    Subjective:   HPI: Megan Bradshaw is a 89 y.o. female presenting on 12/13/2022 for Dizziness, Hip Pain (Right Buttock), Nasal Congestion, Shortness of Breath (/), and Ear Issue (Left ear "feels like vacuum in it")  She presents today with multiple issues. Dizziness ( feels slightly off balance, Nasal congestion in last week, left ear feels like there is a vacuum in it. Hx of COPD, DM left face feels full, not pain. Sleeps on left side with oxygen tubing. Feels well otherwise. SOB, continued but stable. No fever, no change in cough )( no cough).  Reviewed last  OV from  Pulmonary 12/15, on Breo Ellipta inhaler, albuterol as needed. On chronic steroid 5 mg daily. Nocturnal hypoxia treated with nocturnal oxygen... uses humidifier  Right buttock pain, new onset in last 3 days.. sudden pain in right buttock.. sharp. No radiation, no new numbness, no weakness.  No rash. Now improve.. just an ache. No change with activity Hx of neuropathy due to DM.  No fall. No  change in activity. Sitting a lot. No back pain   Does try to walk on treadmill daily for 20 min.     Relevant past medical, surgical, family and social history reviewed and updated as indicated. Interim medical history since our last visit reviewed. Allergies and medications reviewed and updated. Outpatient Medications Prior to Visit  Medication Sig Dispense Refill   ACCU-CHEK GUIDE test strip TEST TWICE DAILY 200 strip 1   Accu-Chek Softclix Lancets lancets Use to check blood  sugar 2 times a day 100 each 5   albuterol (PROVENTIL) (2.5 MG/3ML) 0.083% nebulizer solution Take 3 mLs (2.5 mg total) by nebulization every 4 (four) hours as needed for wheezing or shortness of breath. 150 mL 5   albuterol (VENTOLIN HFA) 108 (90 Base) MCG/ACT inhaler INHALE 1 TO 2 PUFFS BY MOUTH EVERY 6 HOURS FOR PAIN OR WHEEZING OR SHORTNESS OF BREATH 18 g 2   B Complex-Folic Acid (BENFOTIAMINE MULTI-B) CAPS Take 1 capsule by mouth daily. 30 capsule 6   Blood Glucose Monitoring Suppl (ACCU-CHEK GUIDE ME) w/Device KIT See admin instructions.     BREO ELLIPTA 100-25 MCG/ACT AEPB USE 1 INHALATION ORALLY    DAILY 180 each 2   Cholecalciferol (VITAMIN D PO) Take 1 tablet by mouth daily.     clobetasol cream (TEMOVATE) 6.06 % Apply 1 Application topically daily as needed. 30 g 1   gabapentin (NEURONTIN) 100 MG capsule Take 1 capsule (100 mg total) by mouth 3 (three) times daily. 180 capsule 3   glipiZIDE (GLUCOTROL XL) 10 MG 24 hr tablet TAKE 1 TABLET(10 MG) BY MOUTH DAILY WITH BREAKFAST 90 tablet 0   ipratropium-albuterol (DUONEB) 0.5-2.5 (3) MG/3ML SOLN USE 3 ML VIA NEBULIZER EVERY 6 HOURS AS NEEDED 360 mL 1   levothyroxine (SYNTHROID)  75 MCG tablet Take 1 tablet (75 mcg total) by mouth daily before breakfast. 90 tablet 3   metFORMIN (GLUCOPHAGE) 500 MG tablet Take 1 tablet (500 mg total) by mouth 2 (two) times daily with a meal. 180 tablet 3   mirabegron ER (MYRBETRIQ) 25 MG TB24 tablet Take 1 tablet (25 mg total) by mouth daily. 30 tablet 11   montelukast (SINGULAIR) 10 MG tablet TAKE 1 TABLET(10 MG) BY MOUTH AT BEDTIME 30 tablet 3   Multiple Vitamin (MULTIVITAMIN) capsule Take 1 capsule by mouth daily.     nystatin powder APPLY EXTERNALLY TO THE AFFECTED AREA TWICE DAILY 30 g 1   predniSONE (DELTASONE) 5 MG tablet Take 1 tablet (5 mg total) by mouth daily with breakfast. 30 tablet 3   dextromethorphan (DELSYM) 30 MG/5ML liquid Take 30 mg by mouth as needed for cough.     doxycycline (VIBRA-TABS)  100 MG tablet Take 1 tablet (100 mg total) by mouth 2 (two) times daily. 14 tablet 0   mupirocin ointment (BACTROBAN) 2 % Apply 1 Application topically 2 (two) times daily. To left nostril for 5 days 22 g 0   No facility-administered medications prior to visit.     Per HPI unless specifically indicated in ROS section below Review of Systems  Constitutional:  Negative for fatigue and fever.  HENT:  Positive for sinus pressure. Negative for congestion and sinus pain.   Eyes:  Negative for pain.  Respiratory:  Positive for shortness of breath. Negative for cough.   Cardiovascular:  Negative for chest pain, palpitations and leg swelling.  Gastrointestinal:  Negative for abdominal pain.  Genitourinary:  Negative for dysuria and vaginal bleeding.  Musculoskeletal:  Negative for back pain.  Neurological:  Negative for syncope, light-headedness and headaches.  Psychiatric/Behavioral:  Negative for dysphoric mood.    Objective:  BP (!) 140/72   Pulse 83   Temp 98 F (36.7 C) (Oral)   Ht '5\' 2"'$  (1.575 m)   Wt 188 lb 4 oz (85.4 kg)   SpO2 98%   BMI 34.43 kg/m   Wt Readings from Last 3 Encounters:  12/13/22 188 lb 4 oz (85.4 kg)  10/28/22 184 lb (83.5 kg)  09/26/22 184 lb (83.5 kg)      Physical Exam Constitutional:      General: She is not in acute distress.    Appearance: Normal appearance. She is well-developed. She is not ill-appearing or toxic-appearing.  HENT:     Head: Normocephalic.     Right Ear: Hearing, tympanic membrane, ear canal and external ear normal. No middle ear effusion. Tympanic membrane is not erythematous, retracted or bulging.     Left Ear: Hearing, ear canal and external ear normal. A middle ear effusion is present. Tympanic membrane is not erythematous, retracted or bulging.     Nose: No mucosal edema or rhinorrhea.     Right Sinus: No maxillary sinus tenderness or frontal sinus tenderness.     Left Sinus: No maxillary sinus tenderness or frontal sinus  tenderness.     Mouth/Throat:     Pharynx: Uvula midline.  Eyes:     General: Lids are normal. Lids are everted, no foreign bodies appreciated.     Conjunctiva/sclera: Conjunctivae normal.     Pupils: Pupils are equal, round, and reactive to light.  Neck:     Thyroid: No thyroid mass or thyromegaly.     Vascular: No carotid bruit.     Trachea: Trachea normal.  Cardiovascular:  Rate and Rhythm: Normal rate and regular rhythm.     Pulses: Normal pulses.     Heart sounds: Normal heart sounds, S1 normal and S2 normal. No murmur heard.    No friction rub. No gallop.  Pulmonary:     Effort: Pulmonary effort is normal. Tachypnea present. No respiratory distress.     Breath sounds: Normal breath sounds. No decreased breath sounds, wheezing, rhonchi or rales.     Comments:  SOB, tachypnea with exertion at baseline Abdominal:     General: Bowel sounds are normal.     Palpations: Abdomen is soft.     Tenderness: There is no abdominal tenderness.  Musculoskeletal:     Cervical back: Normal, normal range of motion and neck supple.     Thoracic back: Normal.     Lumbar back: Normal. No tenderness or bony tenderness. Normal range of motion. Negative right straight leg raise test and negative left straight leg raise test.     Comments:  TTP in right buttock, no rash  Skin:    General: Skin is warm and dry.     Findings: No rash.  Neurological:     Mental Status: She is alert.  Psychiatric:        Mood and Affect: Mood is not anxious or depressed.        Speech: Speech normal.        Behavior: Behavior normal. Behavior is cooperative.        Thought Content: Thought content normal.        Judgment: Judgment normal.       Results for orders placed or performed in visit on 06/27/22  ECHOCARDIOGRAM COMPLETE  Result Value Ref Range   AR max vel 2.65 cm2   AV Peak grad 5.1 mmHg   Ao pk vel 1.13 m/s   Area-P 1/2 2.08 cm2   AV Area VTI 2.98 cm2   AV Mean grad 3.0 mmHg   AV Area mean  vel 2.63 cm2    Assessment and Plan  Vertigo Assessment & Plan: Acute,   Most likely  due to either eustachian tube dysfunction vs BPPV. Given left face pressure and feeling of pressure in left ear there may be some swelling and inflammation in the left sinus and left TM possibly related to sleeping on the left face wearing nasal cannula.  No clear sign of bacterial infection.  Will treat with Flonase 2 sprays per nostril daily.  If symptoms not improving can consider increase in prednisone temporarily.  If any fever or latent infection can consider antibiotic.  Alternatively his symptoms could be secondary to BPV given balance symptoms, mild vertigo.  Given home desensitization exercises.    Right buttock pain Assessment & Plan: Acute, gluteal strain versus sciatica.  No evidence of radiation of pain to leg so gluteal strain more likely.  No evidence of rash suggesting shingles.  Will treat with gentle range of motion stretching and buttock and can use Tylenol as needed for pain.   COPD mixed type [facility]) Assessment & Plan: Chronic, stable on nocturnal oxygen and Breo Ellipta. At baseline today.   Other orders -     Fluticasone Propionate; Place 2 sprays into both nostrils daily.  Dispense: 16 g; Refill: 2    No follow-ups on file.   Eliezer Lofts, MD

## 2022-12-13 NOTE — Assessment & Plan Note (Signed)
Chronic, stable on nocturnal oxygen and Breo Ellipta. At baseline today.  

## 2022-12-13 NOTE — Patient Instructions (Signed)
Start stretching exercises for right buttock pain.  Start flonase 2 sprays per nostril daily x 2 weeks.  If not improving call for possible increase in prednisone  or start home desensitization exercises for possible  vertigo.

## 2022-12-20 ENCOUNTER — Other Ambulatory Visit: Payer: Medicare Other

## 2022-12-21 ENCOUNTER — Ambulatory Visit: Payer: Medicare Other | Admitting: Family Medicine

## 2022-12-21 ENCOUNTER — Telehealth: Payer: Self-pay

## 2022-12-21 NOTE — Telephone Encounter (Signed)
Call  If she is feeling well, no sign of infeciton, she does not need an OV for just one BS elevation, unless she requests.   I agree with pushing water. She should use sugar substitute like Stevia ( that is natural) and even though she has eaten those things daily... they doe have a lot of carbs in them... there is sugar in toast, sugar, creamer, oatmeal and event he milk. Oatmeal is fine... just no sugar or stevia. Change toast to whole wheat. Continue trying to exercise more than usual today  to help use up extra sugar.

## 2022-12-21 NOTE — Telephone Encounter (Signed)
Patient notified as instructed by telephone and verbalized understanding. Patient stated that she has plenty of Stevia on hand and will start using that instead of sugar. Patient stated that she is feeling well and does not feel that she has an infection. Patient stated since she is feeling well appointment can be cancelled for tomorrow. Patient was advised to continue to monitor her sugar and if any further problems to let Dr. Diona Browner know. Appointment cancelled for 12/22/22 per patient's request.

## 2022-12-21 NOTE — Telephone Encounter (Signed)
I spoke with pt; pt said this morning pt did regular routine; at 5 AM pt FBS was 102; pt drank 2 cups of coffee with 1 tsp sugar and creamer in each cup and 2 pieces of buttered toast. Pt took her glipizide 10 mg and metformin 500 mg at breakfast about 10 AM. Pt ate one bowl of oatmeal with 1 tsp of sugar and milk. At 10:20 pt felt little shaky and pt cked BS and BS was 308. Pt said she has done the same thing and ate and drank the same thing every morning for at least one yr maybe longer and does not understand why BS went up so much this morning. Pt usually only cks FBS. Now pt said she feels OK except for a little stuffiness in head. Pt said she also walked about 10 mins on treadmill this morning. Pt said she used to use sugar substitute but pt thinks sugar is better for her than the substitutes because "they have stuff in them too." Last hgb A1 C was 8.0 on 01/04/22 and last glucose of 156 on 03/03/22. I advised pt since she is feeling OK now to drink more water to help flush sugar out of body (pt said she does not drink a lot of water) and pt scheduled appt with Dr Diona Browner on 12/22/22 at 11 AM with UC & ED precautions and pt voiced understanding. Pt will fast 4 hrs prior to appt. Sending note to Dr Diona Browner and [place] pool.

## 2022-12-22 ENCOUNTER — Ambulatory Visit: Payer: Medicare Other | Admitting: Family Medicine

## 2022-12-23 ENCOUNTER — Other Ambulatory Visit: Payer: Self-pay | Admitting: Family Medicine

## 2023-01-07 ENCOUNTER — Other Ambulatory Visit: Payer: Self-pay | Admitting: Family Medicine

## 2023-01-11 ENCOUNTER — Telehealth: Payer: Self-pay | Admitting: Pulmonary Disease

## 2023-01-11 MED ORDER — FLUTICASONE FUROATE-VILANTEROL 100-25 MCG/ACT IN AEPB: 1.0000 | INHALATION_SPRAY | Freq: Every day | RESPIRATORY_TRACT | 2 refills | Status: DC

## 2023-01-11 NOTE — Telephone Encounter (Signed)
I have sent in the prescription to the patient's pharmacy. ATC patient but I did not get an answer or a voicemail. I will try again later.

## 2023-01-11 NOTE — Telephone Encounter (Signed)
Patient states needs refill for Breo inhaler. Pharmacy is [facility] [address] [place]. Phone number is [phone]. Patient phone number is [phone].

## 2023-01-12 NOTE — Telephone Encounter (Signed)
ATC patient x2--received recording that call could not be be completed.  Will close encounter per office protocol.

## 2023-01-13 ENCOUNTER — Telehealth: Payer: Self-pay | Admitting: *Deleted

## 2023-01-13 DIAGNOSIS — E039 Hypothyroidism, unspecified: Secondary | ICD-10-CM

## 2023-01-13 DIAGNOSIS — E099 Drug or chemical induced diabetes mellitus without complications: Secondary | ICD-10-CM

## 2023-01-13 NOTE — Telephone Encounter (Signed)
-----   Message from Velna Hatchet, RT sent at 01/03/2023 10:45 AM EST ----- Regarding: Wed 3/6 lab Patient is scheduled for cpx, please order future labs.  Thanks, Anda Kraft

## 2023-01-18 ENCOUNTER — Other Ambulatory Visit (INDEPENDENT_AMBULATORY_CARE_PROVIDER_SITE_OTHER): Payer: Medicare Other

## 2023-01-18 ENCOUNTER — Ambulatory Visit (INDEPENDENT_AMBULATORY_CARE_PROVIDER_SITE_OTHER): Payer: Medicare Other

## 2023-01-18 VITALS — Ht 62.0 in | Wt 184.0 lb

## 2023-01-18 DIAGNOSIS — Z0000 Encounter for general adult medical examination without abnormal findings: Secondary | ICD-10-CM | POA: Diagnosis not present

## 2023-01-18 DIAGNOSIS — E039 Hypothyroidism, unspecified: Secondary | ICD-10-CM

## 2023-01-18 DIAGNOSIS — E099 Drug or chemical induced diabetes mellitus without complications: Secondary | ICD-10-CM | POA: Diagnosis not present

## 2023-01-18 DIAGNOSIS — T380X5D Adverse effect of glucocorticoids and synthetic analogues, subsequent encounter: Secondary | ICD-10-CM

## 2023-01-18 LAB — COMPREHENSIVE METABOLIC PANEL
ALT: 14 U/L (ref 0–35)
AST: 18 U/L (ref 0–37)
Albumin: 4 g/dL (ref 3.5–5.2)
Alkaline Phosphatase: 59 U/L (ref 39–117)
BUN: 15 mg/dL (ref 6–23)
CO2: 27 mEq/L (ref 19–32)
Calcium: 9.3 mg/dL (ref 8.4–10.5)
Chloride: 100 mEq/L (ref 96–112)
Creatinine, Ser: 0.85 mg/dL (ref 0.40–1.20)
GFR: 60.26 mL/min (ref >60.00)
Glucose, Bld: 128 mg/dL — ABNORMAL HIGH (ref 70–99)
Potassium: 4.2 mEq/L (ref 3.5–5.1)
Sodium: 138 mEq/L (ref 135–145)
Total Bilirubin: 0.5 mg/dL (ref 0.2–1.2)
Total Protein: 6.4 g/dL (ref 6.0–8.3)

## 2023-01-18 LAB — TSH: TSH: 3.01 u[IU]/mL (ref 0.35–5.50)

## 2023-01-18 LAB — T3, FREE: T3, Free: 2.6 pg/mL (ref 2.3–4.2)

## 2023-01-18 LAB — T4, FREE: Free T4: 0.86 ng/dL (ref 0.60–1.60)

## 2023-01-18 LAB — HEMOGLOBIN A1C: Hgb A1c MFr Bld: 7.3 % — ABNORMAL HIGH (ref 4.6–6.5)

## 2023-01-18 NOTE — Addendum Note (Signed)
Addended by: Ellamae Sia on: 01/18/2023 08:14 AM   Modules accepted: Orders

## 2023-01-18 NOTE — Progress Notes (Signed)
No critical labs need to be addressed urgently. We will discuss labs in detail at upcoming office visit.   

## 2023-01-18 NOTE — Progress Notes (Signed)
I connected with  Bea Graff on 01/18/23 by a audio enabled telemedicine application and verified that I am speaking with the correct person using two identifiers.  Patient Location: Home  Provider Location: Home Office  I discussed the limitations of evaluation and management by telemedicine. The patient expressed understanding and agreed to proceed.  Subjective:   Megan Bradshaw is a 89 y.o. female who presents for Medicare Annual (Subsequent) preventive examination.  Review of Systems      Cardiac Risk Factors include: advanced age (>69mn, >>86women);diabetes mellitus     Objective:    Today's Vitals   01/18/23 0844  Weight: 184 lb (83.5 kg)  Height: '5\' 2"'$  (1.575 m)   Body mass index is 33.65 kg/m.     01/18/2023    8:54 AM 01/04/2018    9:19 AM  Advanced Directives  Does Patient Have a Medical Advance Directive? No No  Would patient like information on creating a medical advance directive? No - Patient declined No - Patient declined    Current Medications (verified) Outpatient Encounter Medications as of 01/18/2023  Medication Sig   ACCU-CHEK GUIDE test strip USE TO TEST TWICE DAILY   Accu-Chek Softclix Lancets lancets Use to check blood sugar 2 times a day   albuterol (PROVENTIL) (2.5 MG/3ML) 0.083% nebulizer solution Take 3 mLs (2.5 mg total) by nebulization every 4 (four) hours as needed for wheezing or shortness of breath.   albuterol (VENTOLIN HFA) 108 (90 Base) MCG/ACT inhaler INHALE 1 TO 2 PUFFS BY MOUTH EVERY 6 HOURS FOR PAIN OR WHEEZING OR SHORTNESS OF BREATH   B Complex-Folic Acid (BENFOTIAMINE MULTI-B) CAPS Take 1 capsule by mouth daily.   Blood Glucose Monitoring Suppl (ACCU-CHEK GUIDE ME) w/Device KIT See admin instructions.   Cholecalciferol (VITAMIN D PO) Take 1 tablet by mouth daily.   clobetasol cream (TEMOVATE) 0AB-123456789% Apply 1 Application topically daily as needed.   fluticasone (FLONASE) 50 MCG/ACT nasal spray Place 2 sprays into both nostrils  daily.   fluticasone furoate-vilanterol (BREO ELLIPTA) 100-25 MCG/ACT AEPB Inhale 1 puff into the lungs daily.   gabapentin (NEURONTIN) 100 MG capsule Take 1 capsule (100 mg total) by mouth 3 (three) times daily.   glipiZIDE (GLUCOTROL XL) 10 MG 24 hr tablet TAKE 1 TABLET(10 MG) BY MOUTH DAILY WITH BREAKFAST   ipratropium-albuterol (DUONEB) 0.5-2.5 (3) MG/3ML SOLN USE 3 ML VIA NEBULIZER EVERY 6 HOURS AS NEEDED   levothyroxine (SYNTHROID) 75 MCG tablet Take 1 tablet (75 mcg total) by mouth daily before breakfast.   metFORMIN (GLUCOPHAGE) 500 MG tablet Take 1 tablet (500 mg total) by mouth 2 (two) times daily with a meal.   Multiple Vitamin (MULTIVITAMIN) capsule Take 1 capsule by mouth daily.   nystatin powder APPLY EXTERNALLY TO THE AFFECTED AREA TWICE DAILY   predniSONE (DELTASONE) 5 MG tablet Take 1 tablet (5 mg total) by mouth daily with breakfast.   mirabegron ER (MYRBETRIQ) 25 MG TB24 tablet Take 1 tablet (25 mg total) by mouth daily.   montelukast (SINGULAIR) 10 MG tablet TAKE 1 TABLET(10 MG) BY MOUTH AT BEDTIME (Patient not taking: Reported on 01/18/2023)   No facility-administered encounter medications on file as of 01/18/2023.    Allergies (verified) Augmentin [amoxicillin-pot clavulanate]   History: Past Medical History:  Diagnosis Date   Arthritis    Asthma    [facility]) [place]    Diabetes ([facility]    Thyroid disease    Past Surgical History:  Procedure Laterality Date  ABDOMINAL HYSTERECTOMY  1984   benign, TAH.   APPENDECTOMY  1939    CATARACT EXTRACTION, BILATERAL     CHOLECYSTECTOMY  1987   NASAL SINUS SURGERY  1990   REPLACEMENT TOTAL KNEE Right 2007   Family History  Problem Relation Age of Onset   Arthritis Father    Diabetes Sister    Diabetes Daughter    Cancer Neg Hx    Heart disease Neg Hx    Stroke Neg Hx    Social History   Socioeconomic History   Marital status: Widowed    Spouse name: Not on file   Number of  children: 4   Years of education: Not on file   Highest education level: Not on file  Occupational History   Occupation: Retired  Tobacco Use   Smoking status: Former    Packs/day: 1.50    Years: 30.00    Total pack years: 45.00    Types: Cigarettes    Quit date: 11/15/1987    Years since quitting: 35.2   Smokeless tobacco: Never  Vaping Use   Vaping Use: Never used  Substance and Sexual Activity   Alcohol use: No    Alcohol/week: 0.0 standard drinks of alcohol   Drug use: No   Sexual activity: Not on file  Other Topics Concern   Not on file  Social History Narrative   Not on file   Social Determinants of Health   Financial Resource Strain: Low Risk  (01/18/2023)   Overall Financial Resource Strain (CARDIA)    Difficulty of Paying Living Expenses: Not hard at all  Food Insecurity: No Food Insecurity (01/18/2023)   Hunger Vital Sign    Worried About Running Out of Food in the Last Year: Never true    [place] in the Last Year: Never true  Transportation Needs: No Transportation Needs (01/18/2023)   PRAPARE - Hydrologist (Medical): No    Lack of Transportation (Non-Medical): No  Physical Activity: Sufficiently Active (01/18/2023)   Exercise Vital Sign    Days of Exercise per Week: 7 days    Minutes of Exercise per Session: 30 min  Stress: No Stress Concern Present (01/18/2023)   [place]    Feeling of Stress : Not at all  Social Connections: Moderately Integrated (01/18/2023)   Social Connection and Isolation Panel [NHANES]    Frequency of Communication with Friends and Family: More than three times a week    Frequency of Social Gatherings with Friends and Family: Three times a week    Attends Religious Services: More than 4 times per year    Active Member of Clubs or Organizations: Yes    Attends Archivist Meetings: More than 4 times per year    Marital Status:  Widowed    Tobacco Counseling Counseling given: Not Answered   Clinical Intake:  Pre-visit preparation completed: Yes  Pain : No/denies pain     Nutritional Risks: None Diabetes: Yes CBG done?: No Did pt. bring in CBG monitor from home?: No  How often do you need to have someone help you when you read instructions, pamphlets, or other written materials from your doctor or pharmacy?: 1 - Never  Diabetic?Nutrition Risk Assessment:  Has the patient had any N/V/D within the last 2 months?  No  Does the patient have any non-healing wounds?  No  Has the patient had any unintentional weight loss or  weight gain?  No   Diabetes:  Is the patient diabetic?  Yes  If diabetic, was a CBG obtained today?  Yes , 101 per pt Did the patient bring in their glucometer from home?  No  How often do you monitor your CBG's? QD.   Financial Strains and Diabetes Management:  Are you having any financial strains with the device, your supplies or your medication? No .  Does the patient want to be seen by Chronic Care Management for management of their diabetes?  No  Would the patient like to be referred to a Nutritionist or for Diabetic Management?  No   Diabetic Exams:  Diabetic Eye Exam: Completed 12/06/2022 [facility] pt to call for appt. Foot exam per PCP  Interpreter Needed?: No  Information entered by :: C.Jaleiah Asay LPN   Activities of Daily Living    01/18/2023    8:55 AM  In your present state of health, do you have any difficulty performing the following activities:  Hearing? 0  Vision? 0  Difficulty concentrating or making decisions? 0  Walking or climbing stairs? 0  Dressing or bathing? 0  Doing errands, shopping? 0  Preparing Food and eating ? N  Using the Toilet? N  In the past six months, have you accidently leaked urine? Y  Comment Wears pads  Do you have problems with loss of bowel control? N  Managing your Medications? N  Managing your Finances? N   Housekeeping or managing your Housekeeping? N    Patient Care Team: Jinny Sanders, MD as PCP - General (Family Medicine) Debbora Dus, [facility] as Pharmacist (Pharmacist) Tyler Pita, MD as Consulting Physician (Pulmonary Disease)  Indicate any recent Medical Services you may have received from other than [facility] providers in the past year (date may be approximate).     Assessment:   This is a routine wellness examination for [place].  Hearing/Vision screen Hearing Screening - Comments:: No aids Vision Screening - Comments:: Readers - [facility]  Dietary issues and exercise activities discussed: Current Exercise Habits: Home exercise routine, Type of exercise: treadmill, Time (Minutes): 30, Frequency (Times/Week): 7, Weekly Exercise (Minutes/Week): 210, Intensity: Mild, Exercise limited by: None identified   Goals Addressed             This Visit's Progress    Patient Stated       Would like to lose some weight.       Depression Screen    01/18/2023    8:53 AM 12/13/2022    8:54 AM 03/26/2021    9:30 AM 12/15/2020    9:18 AM 08/13/2019    8:04 AM 12/07/2017   10:54 AM 09/01/2016    1:28 PM  PHQ 2/9 Scores  PHQ - 2 Score 0 0 0 0 0 0 0    Fall Risk    01/18/2023    8:55 AM 12/13/2022    8:54 AM 03/26/2021    9:30 AM 10/09/2019    5:09 PM 06/14/2019    9:51 AM  Fall Risk   Falls in the past year? 0 0 0 0   Comment    [employer]mm[employer] Telephone Survey: data to providers prior to load [employer] Survey: data to providers prior to load  Number falls in past yr: 0 0     Comment     Emmi Telephone Survey Actual Response =   Injury with Fall? 0 0     Risk for fall due to : No Fall Risks  No Fall Risks     Follow up Falls prevention discussed;Falls evaluation completed Falls evaluation completed       FALL RISK PREVENTION PERTAINING TO THE HOME:  Any stairs in or around the home? Yes  If so, are there any without handrails? No  Home free of loose throw rugs in walkways,  pet beds, electrical cords, etc? Yes  Adequate lighting in your home to reduce risk of falls? Yes   ASSISTIVE DEVICES UTILIZED TO PREVENT FALLS:  Life alert? No  Use of a cane, walker or w/c? No  Grab bars in the bathroom? Yes  Shower chair or bench in shower? Yes  Elevated toilet seat or a handicapped toilet? No  Cognitive Function:        01/18/2023    8:56 AM  6CIT Screen  What Year? 0 points  What month? 0 points  What time? 0 points  Count back from 20 0 points  Months in reverse 0 points  Repeat phrase 2 points  Total Score 2 points    Immunizations Immunization History  Administered Date(s) Administered   Fluad Quad(high Dose 65+) 09/03/2020, 08/02/2021, 08/14/2022   Influenza,inj,Quad PF,6+ Mos 08/20/2015, 07/22/2016, 08/14/2017, 08/23/2018, 08/29/2019   Influenza-Unspecified 08/14/2014   PFIZER(Purple Top)SARS-COV-2 Vaccination 01/07/2020, 01/28/2020, 10/11/2020   Pneumococcal Conjugate-13 08/20/2015   Pneumococcal Polysaccharide-23 08/29/2019   Respiratory Syncytial Virus Vaccine,Recomb Aduvanted(Arexvy) 08/14/2022    TDAP status: Due, Education has been provided regarding the importance of this vaccine. Advised may receive this vaccine at local pharmacy or Health Dept. Aware to provide a copy of the vaccination record if obtained from local pharmacy or Health Dept. Verbalized acceptance and understanding.  Flu Vaccine status: Declined, Education has been provided regarding the importance of this vaccine but patient still declined. Advised may receive this vaccine at local pharmacy or Health Dept. Aware to provide a copy of the vaccination record if obtained from local pharmacy or Health Dept. Verbalized acceptance and understanding.  Pneumococcal vaccine status: Up to date  Covid-19 vaccine status: Information provided on how to obtain vaccines.   Qualifies for Shingles Vaccine? Yes   Zostavax completed Yes   Shingrix Completed?: No.    Education has been  provided regarding the importance of this vaccine. Patient has been advised to call insurance company to determine out of pocket expense if they have not yet received this vaccine. Advised may also receive vaccine at local pharmacy or Health Dept. Verbalized acceptance and understanding.  Screening Tests Health Maintenance  Topic Date Due   DTaP/Tdap/Td (1 - Tdap) Never done   Zoster Vaccines- Shingrix (1 of 2) Never done   DEXA SCAN  Never done   FOOT EXAM  03/22/2022   HEMOGLOBIN A1C  07/04/2022   COVID-19 Vaccine (4 - 2023-24 season) 07/15/2022   OPHTHALMOLOGY EXAM  12/06/2022   Medicare Annual Wellness (AWV)  01/18/2024   Pneumonia Vaccine 35+ Years old  Completed   INFLUENZA VACCINE  Completed   HPV VACCINES  Aged Out    Health Maintenance  Health Maintenance Due  Topic Date Due   DTaP/Tdap/Td (1 - Tdap) Never done   Zoster Vaccines- Shingrix (1 of 2) Never done   DEXA SCAN  Never done   FOOT EXAM  03/22/2022   HEMOGLOBIN A1C  07/04/2022   COVID-19 Vaccine (4 - 2023-24 season) 07/15/2022   OPHTHALMOLOGY EXAM  12/06/2022    Colorectal cancer screening: No longer required.   Mammogram status: No longer required due to age.  Bone Density -  Declined  Lung Cancer Screening: (Low Dose CT Chest recommended if Age 28-80 years, 30 pack-year currently smoking OR have quit w/in 15years.) does not qualify.   Lung Cancer Screening Referral: no  Additional Screening:  Hepatitis C Screening: does not qualify; Completed no  Vision Screening: Recommended annual ophthalmology exams for early detection of glaucoma and other disorders of the eye. Is the patient up to date with their annual eye exam?  No  Who is the provider or what is the name of the office in which the patient attends annual eye exams? [facility] If pt is not established with a provider, would they like to be referred to a provider to establish care? No .   Dental Screening: Recommended annual dental exams for  proper oral hygiene  Community Resource Referral / Chronic Care Management: CRR required this visit?  No   CCM required this visit?  No      Plan:     I have personally reviewed and noted the following in the patient's chart:   Medical and social history Use of alcohol, tobacco or illicit drugs  Current medications and supplements including opioid prescriptions. Patient is not currently taking opioid prescriptions. Functional ability and status Nutritional status Physical activity Advanced directives List of other physicians Hospitalizations, surgeries, and ER visits in previous 12 months Vitals Screenings to include cognitive, depression, and falls Referrals and appointments  In addition, I have reviewed and discussed with patient certain preventive protocols, quality metrics, and best practice recommendations. A written personalized care plan for preventive services as well as general preventive health recommendations were provided to patient.     Lebron Conners, LPN   [id number]   Nurse Notes: Vaccinations: declines all Influenza vaccine: recommend every Fall Pneumococcal vaccine: recommend once per lifetime Prevnar-20 Tdap vaccine: recommend every 10 years Shingles vaccine: recommend Shingrix which is 2 doses 2-6 months apart and over 90% effective     Covid-19: recommend 2 doses one month apart with a booster 6 months later

## 2023-01-18 NOTE — Patient Instructions (Signed)
Megan Bradshaw , Thank you for taking time to come for your Medicare Wellness Visit. I appreciate your ongoing commitment to your health goals. Please review the following plan we discussed and let me know if I can assist you in the future.   These are the goals we discussed:  Goals      Patient Stated     Would like to lose some weight.        This is a list of the screening recommended for you and due dates:  Health Maintenance  Topic Date Due   DTaP/Tdap/Td vaccine (1 - Tdap) Never done   Zoster (Shingles) Vaccine (1 of 2) Never done   DEXA scan (bone density measurement)  Never done   Complete foot exam   03/22/2022   Hemoglobin A1C  07/04/2022   COVID-19 Vaccine (4 - 2023-24 season) 07/15/2022   Eye exam for diabetics  12/06/2022   Medicare Annual Wellness Visit  01/18/2024   Pneumonia Vaccine  Completed   Flu Shot  Completed   HPV Vaccine  Aged Out    Advanced directives: Advance directive discussed with you today. Even though you declined this today, please call our office should you change your mind, and we can give you the proper paperwork for you to fill out.   Conditions/risks identified: none  Next appointment: Follow up in one year for your annual wellness visit 01/22/2024 @ 11:45 via telephone.   Preventive Care 96 Years and Older, Female Preventive care refers to lifestyle choices and visits with your health care provider that can promote health and wellness. What does preventive care include? A yearly physical exam. This is also called an annual well check. Dental exams once or twice a year. Routine eye exams. Ask your health care provider how often you should have your eyes checked. Personal lifestyle choices, including: Daily care of your teeth and gums. Regular physical activity. Eating a healthy diet. Avoiding tobacco and drug use. Limiting alcohol use. Practicing safe sex. Taking low-dose aspirin every day. Taking vitamin and mineral supplements as  recommended by your health care provider. What happens during an annual well check? The services and screenings done by your health care provider during your annual well check will depend on your age, overall health, lifestyle risk factors, and family history of disease. Counseling  Your health care provider may ask you questions about your: Alcohol use. Tobacco use. Drug use. Emotional well-being. Home and relationship well-being. Sexual activity. Eating habits. History of falls. Memory and ability to understand (cognition). Work and work Statistician. Reproductive health. Screening  You may have the following tests or measurements: Height, weight, and BMI. Blood pressure. Lipid and cholesterol levels. These may be checked every 5 years, or more frequently if you are over 13 years old. Skin check. Lung cancer screening. You may have this screening every year starting at age 63 if you have a 30-pack-year history of smoking and currently smoke or have quit within the past 15 years. Fecal occult blood test (FOBT) of the stool. You may have this test every year starting at age 53. Flexible sigmoidoscopy or colonoscopy. You may have a sigmoidoscopy every 5 years or a colonoscopy every 10 years starting at age 21. Hepatitis C blood test. Hepatitis B blood test. Sexually transmitted disease (STD) testing. Diabetes screening. This is done by checking your blood sugar (glucose) after you have not eaten for a while (fasting). You may have this done every 1-3 years. Bone density scan. This is  done to screen for osteoporosis. You may have this done starting at age 48. Mammogram. This may be done every 1-2 years. Talk to your health care provider about how often you should have regular mammograms. Talk with your health care provider about your test results, treatment options, and if necessary, the need for more tests. Vaccines  Your health care provider may recommend certain vaccines, such  as: Influenza vaccine. This is recommended every year. Tetanus, diphtheria, and acellular pertussis (Tdap, Td) vaccine. You may need a Td booster every 10 years. Zoster vaccine. You may need this after age 100. Pneumococcal 13-valent conjugate (PCV13) vaccine. One dose is recommended after age 25. Pneumococcal polysaccharide (PPSV23) vaccine. One dose is recommended after age 3. Talk to your health care provider about which screenings and vaccines you need and how often you need them. This information is not intended to replace advice given to you by your health care provider. Make sure you discuss any questions you have with your health care provider. Document Released: 11/27/2015 Document Revised: 07/20/2016 Document Reviewed: 09/01/2015 Elsevier Interactive Patient Education  2017 [place] Prevention in the Home Falls can cause injuries. They can happen to people of all ages. There are many things you can do to make your home safe and to help prevent falls. What can I do on the outside of my home? Regularly fix the edges of walkways and driveways and fix any cracks. Remove anything that might make you trip as you walk through a door, such as a raised step or threshold. Trim any bushes or trees on the path to your home. Use bright outdoor lighting. Clear any walking paths of anything that might make someone trip, such as rocks or tools. Regularly check to see if handrails are loose or broken. Make sure that both sides of any steps have handrails. Any raised decks and porches should have guardrails on the edges. Have any leaves, snow, or ice cleared regularly. Use sand or salt on walking paths during winter. Clean up any spills in your garage right away. This includes oil or grease spills. What can I do in the bathroom? Use night lights. Install grab bars by the toilet and in the tub and shower. Do not use towel bars as grab bars. Use non-skid mats or decals in the tub or  shower. If you need to sit down in the shower, use a plastic, non-slip stool. Keep the floor dry. Clean up any water that spills on the floor as soon as it happens. Remove soap buildup in the tub or shower regularly. Attach bath mats securely with double-sided non-slip rug tape. Do not have throw rugs and other things on the floor that can make you trip. What can I do in the bedroom? Use night lights. Make sure that you have a light by your bed that is easy to reach. Do not use any sheets or blankets that are too big for your bed. They should not hang down onto the floor. Have a firm chair that has side arms. You can use this for support while you get dressed. Do not have throw rugs and other things on the floor that can make you trip. What can I do in the kitchen? Clean up any spills right away. Avoid walking on wet floors. Keep items that you use a lot in easy-to-reach places. If you need to reach something above you, use a strong step stool that has a grab bar. Keep electrical cords out of  the way. Do not use floor polish or wax that makes floors slippery. If you must use wax, use non-skid floor wax. Do not have throw rugs and other things on the floor that can make you trip. What can I do with my stairs? Do not leave any items on the stairs. Make sure that there are handrails on both sides of the stairs and use them. Fix handrails that are broken or loose. Make sure that handrails are as long as the stairways. Check any carpeting to make sure that it is firmly attached to the stairs. Fix any carpet that is loose or worn. Avoid having throw rugs at the top or bottom of the stairs. If you do have throw rugs, attach them to the floor with carpet tape. Make sure that you have a light switch at the top of the stairs and the bottom of the stairs. If you do not have them, ask someone to add them for you. What else can I do to help prevent falls? Wear shoes that: Do not have high heels. Have  rubber bottoms. Are comfortable and fit you well. Are closed at the toe. Do not wear sandals. If you use a stepladder: Make sure that it is fully opened. Do not climb a closed stepladder. Make sure that both sides of the stepladder are locked into place. Ask someone to hold it for you, if possible. Clearly mark and make sure that you can see: Any grab bars or handrails. First and last steps. Where the edge of each step is. Use tools that help you move around (mobility aids) if they are needed. These include: Canes. Walkers. Scooters. Crutches. Turn on the lights when you go into a dark area. Replace any light bulbs as soon as they burn out. Set up your furniture so you have a clear path. Avoid moving your furniture around. If any of your floors are uneven, fix them. If there are any pets around you, be aware of where they are. Review your medicines with your doctor. Some medicines can make you feel dizzy. This can increase your chance of falling. Ask your doctor what other things that you can do to help prevent falls. This information is not intended to replace advice given to you by your health care provider. Make sure you discuss any questions you have with your health care provider. Document Released: 08/27/2009 Document Revised: 04/07/2016 Document Reviewed: 12/05/2014 Elsevier Interactive Patient Education  2017 [employer].

## 2023-01-19 ENCOUNTER — Ambulatory Visit (INDEPENDENT_AMBULATORY_CARE_PROVIDER_SITE_OTHER): Payer: Medicare Other | Admitting: Family Medicine

## 2023-01-19 VITALS — BP 136/74 | HR 84 | Temp 97.0°F | Ht 62.5 in | Wt 186.0 lb

## 2023-01-19 DIAGNOSIS — E099 Drug or chemical induced diabetes mellitus without complications: Secondary | ICD-10-CM

## 2023-01-19 DIAGNOSIS — E1140 Type 2 diabetes mellitus with diabetic neuropathy, unspecified: Secondary | ICD-10-CM

## 2023-01-19 DIAGNOSIS — J449 Chronic obstructive pulmonary disease, unspecified: Secondary | ICD-10-CM | POA: Diagnosis not present

## 2023-01-19 DIAGNOSIS — T380X5D Adverse effect of glucocorticoids and synthetic analogues, subsequent encounter: Secondary | ICD-10-CM

## 2023-01-19 DIAGNOSIS — K130 Diseases of lips: Secondary | ICD-10-CM | POA: Insufficient documentation

## 2023-01-19 DIAGNOSIS — L84 Corns and callosities: Secondary | ICD-10-CM

## 2023-01-19 DIAGNOSIS — E039 Hypothyroidism, unspecified: Secondary | ICD-10-CM

## 2023-01-19 LAB — MICROALBUMIN / CREATININE URINE RATIO
Creatinine,U: 75.8 mg/dL
Microalb Creat Ratio: 0.9 mg/g (ref 0.0–30.0)
Microalb, Ur: <0.7 mg/dL (ref 0.0–1.9)

## 2023-01-19 LAB — HM DIABETES FOOT EXAM

## 2023-01-19 MED ORDER — KETOCONAZOLE 2 % EX CREA: 1.0000 | TOPICAL_CREAM | Freq: Every day | CUTANEOUS | 0 refills | Status: DC

## 2023-01-19 NOTE — Assessment & Plan Note (Signed)
Chronic, tolerable control , continue current medication regimen  metformin 500 mg BID and glipizide xl 10 mg daily

## 2023-01-19 NOTE — Assessment & Plan Note (Signed)
Chronic,  Preulcerative calluses present prescription for diabetic shoes written

## 2023-01-19 NOTE — Assessment & Plan Note (Signed)
Stable, chronic.  Continue current medication.    levothyroxine 75 mcg daily

## 2023-01-19 NOTE — Assessment & Plan Note (Signed)
Acute   Likely yeast infection due to  use of breo. Ketoconazole cream daily.  She will contact me if it is not improving to consider B12 evaluation versus steroid medication.

## 2023-01-19 NOTE — Patient Instructions (Signed)
Set up yearly eye exam for diabetes and have the opthalmologist send [place] a copy of the evaluation for the chart.

## 2023-01-19 NOTE — Assessment & Plan Note (Signed)
Chronic, stable on nocturnal oxygen and Breo Ellipta. At baseline today.

## 2023-01-19 NOTE — Progress Notes (Signed)
Patient ID: Megan Bradshaw, female    DOB: [birth year], 89 y.o.   MRN: [id number]  This visit was conducted in person.  There were no vitals taken for this visit.   CC: No chief complaint on file.   Subjective:   HPI: Megan Bradshaw is a 89 y.o. female presenting on 01/19/2023 for No chief complaint on file.  The patient presents for review of chronic health problems. She also has the following acute concerns today:  Cracks at crease  of corners of mouth.  The patient saw a LPN or RN for medicare wellness visit.  Prevention and wellness was reviewed in detail. Note reviewed and important notes copied below.  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 daily Lab Results  Component Value Date   HGBA1C 7.3 (H) 01/18/2023  Using medications without difficulties: Hypoglycemic episodes: Hyperglycemic episodes: Feet problems: Blood Sugars averaging: eye exam within last year: Associated with neuropathy: Moderate control on gabapentin 400 mg nightly  COPD stable: Followed by pulmonary on albuterol as needed, Breo inhaler and low-dose daily prednisone.        Relevant past medical, surgical, family and social history reviewed and updated as indicated. Interim medical history since our last visit reviewed. Allergies and medications reviewed and updated. Outpatient Medications Prior to Visit  Medication Sig Dispense Refill   ACCU-CHEK GUIDE test strip USE TO TEST TWICE DAILY 200 strip 1   Accu-Chek Softclix Lancets lancets Use to check blood sugar 2 times a day 100 each 5   albuterol (PROVENTIL) (2.5 MG/3ML) 0.083% nebulizer solution Take 3 mLs (2.5 mg total) by nebulization every 4 (four) hours as needed for wheezing or shortness of breath. 150 mL 5   albuterol (VENTOLIN HFA) 108 (90 Base) MCG/ACT inhaler INHALE 1  TO 2 PUFFS BY MOUTH EVERY 6 HOURS FOR PAIN OR WHEEZING OR SHORTNESS OF BREATH 18 g 2   B Complex-Folic Acid (BENFOTIAMINE MULTI-B) CAPS Take 1 capsule by mouth daily. 30 capsule 6   Blood Glucose Monitoring Suppl (ACCU-CHEK GUIDE ME) w/Device KIT See admin instructions.     Cholecalciferol (VITAMIN D PO) Take 1 tablet by mouth daily.     clobetasol cream (TEMOVATE) AB-123456789 % Apply 1 Application topically daily as needed. 30 g 1   fluticasone (FLONASE) 50 MCG/ACT nasal spray Place 2 sprays into both nostrils daily. 16 g 2   fluticasone furoate-vilanterol (BREO ELLIPTA) 100-25 MCG/ACT AEPB Inhale 1 puff into the lungs daily. 180 each 2   gabapentin (NEURONTIN) 100 MG capsule Take 1 capsule (100 mg total) by mouth 3 (three) times daily. 180 capsule 3   glipiZIDE (GLUCOTROL XL) 10 MG 24 hr tablet TAKE 1 TABLET(10 MG) BY MOUTH DAILY WITH BREAKFAST 90 tablet 0   ipratropium-albuterol (DUONEB) 0.5-2.5 (3) MG/3ML SOLN USE 3 ML VIA NEBULIZER EVERY 6 HOURS AS NEEDED 360 mL 1   levothyroxine (SYNTHROID) 75 MCG tablet Take 1 tablet (75 mcg total) by mouth daily before breakfast. 90 tablet 3   metFORMIN (GLUCOPHAGE) 500 MG tablet Take 1 tablet (500 mg total) by mouth 2 (two) times daily with a meal. 180 tablet 3   mirabegron ER (MYRBETRIQ) 25 MG TB24 tablet Take 1 tablet (25 mg total) by mouth daily. 30 tablet 11   montelukast (SINGULAIR) 10 MG tablet TAKE 1 TABLET(10 MG) BY MOUTH AT BEDTIME (Patient  not taking: Reported on 01/18/2023) 30 tablet 3   Multiple Vitamin (MULTIVITAMIN) capsule Take 1 capsule by mouth daily.     nystatin powder APPLY EXTERNALLY TO THE AFFECTED AREA TWICE DAILY 30 g 1   predniSONE (DELTASONE) 5 MG tablet Take 1 tablet (5 mg total) by mouth daily with breakfast. 30 tablet 3   No facility-administered medications prior to visit.     Per HPI unless specifically indicated in ROS section below Review of Systems  Constitutional:  Negative for fatigue and fever.  HENT:  Negative for  congestion.   Eyes:  Negative for pain.  Respiratory:  Positive for shortness of breath. Negative for cough.   Cardiovascular:  Negative for chest pain, palpitations and leg swelling.  Gastrointestinal:  Negative for abdominal pain.  Genitourinary:  Negative for dysuria and vaginal bleeding.  Musculoskeletal:  Negative for back pain.  Neurological:  Negative for syncope, light-headedness and headaches.  Psychiatric/Behavioral:  Negative for dysphoric mood.     Objective:  There were no vitals taken for this visit.  Wt Readings from Last 3 Encounters:  01/18/23 184 lb (83.5 kg)  12/13/22 188 lb 4 oz (85.4 kg)  10/28/22 184 lb (83.5 kg)      Physical Exam Constitutional:      General: She is not in acute distress.    Appearance: Normal appearance. She is well-developed. She is not ill-appearing or toxic-appearing.  HENT:     Head: Normocephalic.     Right Ear: Hearing, tympanic membrane, ear canal and external ear normal. Tympanic membrane is not erythematous, retracted or bulging.     Left Ear: Hearing, tympanic membrane, ear canal and external ear normal. Tympanic membrane is not erythematous, retracted or bulging.     Nose: No mucosal edema or rhinorrhea.     Right Sinus: No maxillary sinus tenderness or frontal sinus tenderness.     Left Sinus: No maxillary sinus tenderness or frontal sinus tenderness.     Mouth/Throat:     Mouth: Oral lesions present.     Pharynx: Uvula midline.     Comments: Erythema at bilateral creases of lips Eyes:     General: Lids are normal. Lids are everted, no foreign bodies appreciated.     Conjunctiva/sclera: Conjunctivae normal.     Pupils: Pupils are equal, round, and reactive to light.  Neck:     Thyroid: No thyroid mass or thyromegaly.     Vascular: No carotid bruit.     Trachea: Trachea normal.  Cardiovascular:     Rate and Rhythm: Normal rate and regular rhythm.     Pulses: Normal pulses.     Heart sounds: Normal heart sounds, S1  normal and S2 normal. No murmur heard.    No friction rub. No gallop.  Pulmonary:     Effort: Pulmonary effort is normal. No tachypnea or respiratory distress.     Breath sounds: Normal breath sounds. No decreased breath sounds, wheezing, rhonchi or rales.  Abdominal:     General: Bowel sounds are normal.     Palpations: Abdomen is soft.     Tenderness: There is no abdominal tenderness.  Musculoskeletal:     Cervical back: Normal range of motion and neck supple.  Skin:    General: Skin is warm and dry.     Findings: No rash.  Neurological:     Mental Status: She is alert.  Psychiatric:        Mood and Affect: Mood is not anxious or depressed.  Speech: Speech normal.        Behavior: Behavior normal. Behavior is cooperative.        Thought Content: Thought content normal.        Judgment: Judgment normal.       Diabetic foot exam:  Abnormal inspection.. severe bunion and hammer toes bilaterally. No skin breakdown Pre ulcerative  calluses  Normal DP pulses  No sensation to light touch and monofilament Nails normal  Results for orders placed or performed in visit on 01/18/23  T3, Free  Result Value Ref Range   T3, Free 2.6 2.3 - 4.2 pg/mL  T4, free  Result Value Ref Range   Free T4 0.86 0.60 - 1.60 ng/dL  Hemoglobin A1c  Result Value Ref Range   Hgb A1c MFr Bld 7.3 (H) 4.6 - 6.5 %  TSH  Result Value Ref Range   TSH 3.01 0.35 - 5.50 uIU/mL  Comprehensive metabolic panel  Result Value Ref Range   Sodium 138 135 - 145 mEq/L   Potassium 4.2 3.5 - 5.1 mEq/L   Chloride 100 96 - 112 mEq/L   CO2 27 19 - 32 mEq/L   Glucose, Bld 128 (H) 70 - 99 mg/dL   BUN 15 6 - 23 mg/dL   Creatinine, Ser 0.85 0.40 - 1.20 mg/dL   Total Bilirubin 0.5 0.2 - 1.2 mg/dL   Alkaline Phosphatase 59 39 - 117 U/L   AST 18 0 - 37 U/L   ALT 14 0 - 35 U/L   Total Protein 6.4 6.0 - 8.3 g/dL   Albumin 4.0 3.5 - 5.2 g/dL   GFR 60.26 >60.00 mL/min   Calcium 9.3 8.4 - 10.5 mg/dL    Assessment  and Plan The patient's preventative maintenance and recommended screening tests for an annual wellness exam were reviewed in full today. Brought up to date unless services declined.  Counselled on the importance of diet, exercise, and its role in overall health and mortality. The patient's FH and SH was reviewed, including their home life, tobacco status, and drug and alcohol status.   Vaccines:   COVID x 3, PNA x 2, flu 2021, Td due, consider shingrix Pap/DVE:  Not indicated Mammo: not indicated Bone Density: DUE, not interested Colon:  Not indicated given age Smoking Status: former, aged out of lung cancer screening CT program ETOH/ drug use: none/none  Hep C:  Done in past   There are no diagnoses linked to this encounter.   No follow-ups on file.   Eliezer Lofts, MD

## 2023-01-19 NOTE — Assessment & Plan Note (Addendum)
Chronic, associated with diabetes, moderate control   Tolerable on 200 mg -300 mg   po qHS.  Preulcerative calluses present prescription for diabetic shoes written

## 2023-01-20 ENCOUNTER — Encounter: Payer: Self-pay | Admitting: *Deleted

## 2023-01-25 ENCOUNTER — Telehealth: Payer: Self-pay

## 2023-01-25 NOTE — Patient Outreach (Signed)
  Care Coordination   01/25/2023 Name: Megan Bradshaw MRN: [id number] DOB: [birth year]   Care Coordination Outreach Attempts:  An unsuccessful telephone outreach was attempted today to offer the patient information about available care coordination services as a benefit of their health plan.  Unable to reach patient or leave voice message.  Phone only rang.   Follow Up Plan:  Additional outreach attempts will be made to offer the patient care coordination information and services.   Encounter Outcome:  No Answer   Care Coordination Interventions:  No, not indicated    Quinn Plowman [facility] [place] [phone] direct line

## 2023-02-08 ENCOUNTER — Ambulatory Visit (INDEPENDENT_AMBULATORY_CARE_PROVIDER_SITE_OTHER): Payer: Medicare Other | Admitting: Family Medicine

## 2023-02-08 ENCOUNTER — Other Ambulatory Visit: Payer: Self-pay | Admitting: Family Medicine

## 2023-02-08 ENCOUNTER — Encounter: Payer: Self-pay | Admitting: Family Medicine

## 2023-02-08 VITALS — BP 100/80 | HR 76 | Temp 97.3°F | Ht 62.5 in | Wt 187.1 lb

## 2023-02-08 DIAGNOSIS — J441 Chronic obstructive pulmonary disease with (acute) exacerbation: Secondary | ICD-10-CM | POA: Diagnosis not present

## 2023-02-08 DIAGNOSIS — T380X5D Adverse effect of glucocorticoids and synthetic analogues, subsequent encounter: Secondary | ICD-10-CM

## 2023-02-08 DIAGNOSIS — J019 Acute sinusitis, unspecified: Secondary | ICD-10-CM

## 2023-02-08 DIAGNOSIS — E099 Drug or chemical induced diabetes mellitus without complications: Secondary | ICD-10-CM | POA: Diagnosis not present

## 2023-02-08 MED ORDER — PREDNISONE 10 MG PO TABS: 10.0000 mg | ORAL_TABLET | Freq: Every day | ORAL | 0 refills | Status: DC

## 2023-02-08 MED ORDER — AMOXICILLIN 500 MG PO CAPS: 1000.0000 mg | ORAL_CAPSULE | Freq: Two times a day (BID) | ORAL | 0 refills | Status: DC

## 2023-02-08 NOTE — Assessment & Plan Note (Signed)
Evidence of mild COPD exacerbation, acute on chronic. Continue Breo Ellipta daily and DuoNeb as needed.  Will treat with temporary increase in prednisone then return to previous lower prednisone dose.

## 2023-02-08 NOTE — Assessment & Plan Note (Signed)
Chronic, she is well-controlled.  She will follow her blood sugars closely and drink lots of water and work on [employer] especially while she is on increased dose of steroid

## 2023-02-08 NOTE — Assessment & Plan Note (Addendum)
Acute, at this point most likely allergic versus viral.  Will treat COPD exacerbation.  Use nasal saline and Flonase 2 sprays per nostril daily.  If not improving as expected will treat with amoxicillin 500 mg 2 tablets twice daily for 10 days to cover for bacterial superinfection.

## 2023-02-08 NOTE — Patient Instructions (Signed)
Complete prednisone taper then return to chronic daily prednisone 5 mg daily.  If not improving as expected.. complete antibiotics.  Go to ER if severe shortness of breath.

## 2023-02-08 NOTE — Progress Notes (Signed)
Patient ID: Megan Bradshaw, female    DOB: 1930-11-14, 89 y.o.   MRN: [id number]  This visit was conducted in person.  BP 100/80   Pulse 76   Temp (!) 97.3 F (36.3 C) (Temporal)   Ht 5' 2.5" (1.588 m)   Wt 187 lb 2 oz (84.9 kg)   SpO2 95%   BMI 33.68 kg/m    CC:  Chief Complaint  Patient presents with   Nasal Congestion    X 1 week   Sinus Drainage   Wheezing         Subjective:   HPI: Megan Bradshaw is a 89 y.o. female presenting on 02/08/2023 for Nasal Congestion (X 1 week), Sinus Drainage, and Wheezing (/)  Hx of   severe COPD  on chronic prednisone 5 mg daily  Duoneb,  Breo Ellipta, flonase     Date of onset: 1 week ago Initial symptoms included  ear fullness, dizziness, post nasal drip, scratchy throat. Symptoms progressed to face pain.  No fever  Occ cough.. copd acting up.. some increase in wheeze.  Using oxygen at night.   When  dizzy.. blood sugar was normal. Has resolved now.    Has been using nebulizer more frequently.  Sick contacts:  COVID testing:   none   Last antibiotics 09/2022 doxycycline.  Say azithromycin ot helpful in apst.   She has tried to treat with     Former smoker.   Relevant past medical, surgical, family and social history reviewed and updated as indicated. Interim medical history since our last visit reviewed. Allergies and medications reviewed and updated. Outpatient Medications Prior to Visit  Medication Sig Dispense Refill   ACCU-CHEK GUIDE test strip USE TO TEST TWICE DAILY 200 strip 1   Accu-Chek Softclix Lancets lancets Use to check blood sugar 2 times a day 100 each 5   albuterol (PROVENTIL) (2.5 MG/3ML) 0.083% nebulizer solution Take 3 mLs (2.5 mg total) by nebulization every 4 (four) hours as needed for wheezing or shortness of breath. 150 mL 5   albuterol (VENTOLIN HFA) 108 (90 Base) MCG/ACT inhaler INHALE 1 TO 2 PUFFS BY MOUTH EVERY 6 HOURS FOR PAIN OR WHEEZING OR SHORTNESS OF BREATH 18 g 2   B Complex-Folic  Acid (BENFOTIAMINE MULTI-B) CAPS Take 1 capsule by mouth daily. 30 capsule 6   Blood Glucose Monitoring Suppl (ACCU-CHEK GUIDE ME) w/Device KIT See admin instructions.     Cholecalciferol (VITAMIN D PO) Take 1 tablet by mouth daily.     clobetasol cream (TEMOVATE) AB-123456789 % Apply 1 Application topically daily as needed. 30 g 1   fluticasone (FLONASE) 50 MCG/ACT nasal spray Place 2 sprays into both nostrils daily. 16 g 2   fluticasone furoate-vilanterol (BREO ELLIPTA) 100-25 MCG/ACT AEPB Inhale 1 puff into the lungs daily. 180 each 2   gabapentin (NEURONTIN) 100 MG capsule Take 1 capsule (100 mg total) by mouth 3 (three) times daily. 180 capsule 3   glipiZIDE (GLUCOTROL XL) 10 MG 24 hr tablet TAKE 1 TABLET(10 MG) BY MOUTH DAILY WITH BREAKFAST 90 tablet 0   ipratropium-albuterol (DUONEB) 0.5-2.5 (3) MG/3ML SOLN USE 3 ML VIA NEBULIZER EVERY 6 HOURS AS NEEDED 360 mL 1   ketoconazole (NIZORAL) 2 % cream Apply 1 Application topically daily. 15 g 0   levothyroxine (SYNTHROID) 75 MCG tablet Take 1 tablet (75 mcg total) by mouth daily before breakfast. 90 tablet 3   metFORMIN (GLUCOPHAGE) 500 MG tablet TAKE 1 TABLET(500 MG) BY MOUTH TWICE  DAILY WITH A MEAL 180 tablet 3   montelukast (SINGULAIR) 10 MG tablet TAKE 1 TABLET(10 MG) BY MOUTH AT BEDTIME 30 tablet 3   Multiple Vitamin (MULTIVITAMIN) capsule Take 1 capsule by mouth daily.     nystatin powder APPLY EXTERNALLY TO THE AFFECTED AREA TWICE DAILY 30 g 1   predniSONE (DELTASONE) 5 MG tablet Take 1 tablet (5 mg total) by mouth daily with breakfast. 30 tablet 3   mirabegron ER (MYRBETRIQ) 25 MG TB24 tablet Take 1 tablet (25 mg total) by mouth daily. 30 tablet 11   No facility-administered medications prior to visit.     Per HPI unless specifically indicated in ROS section below Review of Systems  Constitutional:  Positive for fatigue. Negative for fever.  HENT:  Positive for congestion, sinus pressure, sinus pain and sore throat. Negative for ear pain.    Eyes:  Negative for pain.  Respiratory:  Positive for wheezing. Negative for chest tightness and shortness of breath.   Cardiovascular:  Negative for chest pain, palpitations and leg swelling.  Gastrointestinal:  Negative for abdominal pain.  Genitourinary:  Negative for dysuria.   Objective:  BP 100/80   Pulse 76   Temp (!) 97.3 F (36.3 C) (Temporal)   Ht 5' 2.5" (1.588 m)   Wt 187 lb 2 oz (84.9 kg)   SpO2 95%   BMI 33.68 kg/m   Wt Readings from Last 3 Encounters:  02/08/23 187 lb 2 oz (84.9 kg)  01/19/23 186 lb (84.4 kg)  01/18/23 184 lb (83.5 kg)      Physical Exam Constitutional:      General: She is not in acute distress.    Appearance: Normal appearance. She is well-developed. She is not ill-appearing or toxic-appearing.  HENT:     Head: Normocephalic.     Right Ear: Hearing, ear canal and external ear normal. A middle ear effusion is present. Tympanic membrane is not erythematous, retracted or bulging.     Left Ear: Hearing, ear canal and external ear normal. A middle ear effusion is present. Tympanic membrane is not erythematous, retracted or bulging.     Nose: Congestion present. No mucosal edema or rhinorrhea.     Right Turbinates: Enlarged and swollen.     Left Turbinates: Enlarged and swollen.     Right Sinus: No maxillary sinus tenderness or frontal sinus tenderness.     Left Sinus: No maxillary sinus tenderness or frontal sinus tenderness.     Comments: Ttp over ethmoid sinus bilaterally    Mouth/Throat:     Pharynx: Uvula midline.  Eyes:     General: Lids are normal. Lids are everted, no foreign bodies appreciated.     Conjunctiva/sclera: Conjunctivae normal.     Pupils: Pupils are equal, round, and reactive to light.  Neck:     Thyroid: No thyroid mass or thyromegaly.     Vascular: No carotid bruit.     Trachea: Trachea normal.  Cardiovascular:     Rate and Rhythm: Normal rate and regular rhythm.     Pulses: Normal pulses.     Heart sounds: Normal  heart sounds, S1 normal and S2 normal. No murmur heard.    No friction rub. No gallop.  Pulmonary:     Effort: Pulmonary effort is normal. No tachypnea or respiratory distress.     Breath sounds: Wheezing present. No decreased breath sounds, rhonchi or rales.     Comments:  Scattered wheeze. Abdominal:     General: Bowel sounds are  normal.     Palpations: Abdomen is soft.     Tenderness: There is no abdominal tenderness.  Musculoskeletal:     Cervical back: Normal range of motion and neck supple.  Skin:    General: Skin is warm and dry.     Findings: No rash.  Neurological:     Mental Status: She is alert.  Psychiatric:        Mood and Affect: Mood is not anxious or depressed.        Speech: Speech normal.        Behavior: Behavior normal. Behavior is cooperative.        Thought Content: Thought content normal.        Judgment: Judgment normal.       Results for orders placed or performed in visit on 01/19/23  Microalbumin / creatinine urine ratio  Result Value Ref Range   Microalb, Ur <0.7 0.0 - 1.9 mg/dL   Creatinine,U 75.8 mg/dL   Microalb Creat Ratio 0.9 0.0 - 30.0 mg/g  HM DIABETES FOOT EXAM  Result Value Ref Range   HM Diabetic Foot Exam done     Assessment and Plan  Acute non-recurrent sinusitis, unspecified location Assessment & Plan: Acute, at this point most likely allergic versus viral.  Will treat COPD exacerbation.  Use nasal saline and Flonase 2 sprays per nostril daily.  If not improving as expected will treat with amoxicillin 500 mg 2 tablets twice daily for 10 days to cover for bacterial superinfection.   COPD exacerbation ([place]) Assessment & Plan: Evidence of mild COPD exacerbation, acute on chronic. Continue Breo Ellipta daily and DuoNeb as needed.  Will treat with temporary increase in prednisone then return to previous lower prednisone dose.   Steroid-induced diabetes mellitus, subsequent encounter [facility]) Assessment & Plan: Chronic, she is  well-controlled.  She will follow her blood sugars closely and drink lots of water and work on [employer] especially while she is on increased dose of steroid   Other orders -     Amoxicillin; Take 2 capsules (1,000 mg total) by mouth 2 (two) times daily. Hold, fill if not improving with pprednisonein 3-4 days.  Dispense: 40 capsule; Refill: 0 -     predniSONE; Take 1 tablet (10 mg total) by mouth daily with breakfast.  Dispense: 15 tablet; Refill: 0    Return if symptoms worsen or fail to improve.   Eliezer Lofts, MD

## 2023-02-17 ENCOUNTER — Ambulatory Visit (INDEPENDENT_AMBULATORY_CARE_PROVIDER_SITE_OTHER): Payer: Medicare Other | Admitting: Podiatry

## 2023-02-17 DIAGNOSIS — E0843 Diabetes mellitus due to underlying condition with diabetic autonomic (poly)neuropathy: Secondary | ICD-10-CM | POA: Diagnosis not present

## 2023-02-17 DIAGNOSIS — M2042 Other hammer toe(s) (acquired), left foot: Secondary | ICD-10-CM | POA: Diagnosis not present

## 2023-02-17 NOTE — Progress Notes (Signed)
   Chief Complaint  Patient presents with   Diabetes    Patient came in today for Diabetic foot care, and shoes A1c- 7.0, BG-87    HPI: 89 y.o. female presenting today for annual routine diabetic foot exam and to be fitted for diabetic shoes.  Patient has severe hammertoes to the second digits bilateral.  No pain in the feet.  Past Medical History:  Diagnosis Date   Arthritis    Asthma    COPD (chronic obstructive pulmonary disease) (HCC)    Diabetes (HCC)    Thyroid disease     Past Surgical History:  Procedure Laterality Date   ABDOMINAL HYSTERECTOMY  1984   benign, TAH.   APPENDECTOMY  1939    CATARACT EXTRACTION, BILATERAL     CHOLECYSTECTOMY  1987   NASAL SINUS SURGERY  1990   REPLACEMENT TOTAL KNEE Right 2007    Allergies  Allergen Reactions   Augmentin [Amoxicillin-Pot Clavulanate] Diarrhea     Physical Exam: General: The patient is alert and oriented x3 in no acute distress.  Dermatology: Skin is warm, dry and supple bilateral lower extremities.  He also callus noted to the second MTP right foot.  Vascular: Palpable pedal pulses bilaterally. Capillary refill within normal limits.  Moderate edema noted bilateral lower extremities  Neurological: Light touch and protective threshold diminished  Musculoskeletal Exam: Severe hammertoe deformity noted to the second digit bilateral contributing to the preulcerative callus to the plantar aspect of the second MTP right  Assessment: 1.  Diabetes mellitus with peripheral polyneuropathy 2.  Hammertoe deformity second digit bilateral   Plan of Care:  1. Patient evaluated.  -Comprehensive diabetic foot exam performed today -Appointment with our diabetic shoe department for custom molded diabetic insoles and shoes -Return to clinic annually     Felecia Shelling, DPM [facility]  Dr. Felecia Shelling, DPM    [address] [place], [place] [address]                 Office [phone]  Fax [phone]

## 2023-03-02 ENCOUNTER — Ambulatory Visit (INDEPENDENT_AMBULATORY_CARE_PROVIDER_SITE_OTHER): Payer: Medicare Other | Admitting: *Deleted

## 2023-03-02 ENCOUNTER — Ambulatory Visit (INDEPENDENT_AMBULATORY_CARE_PROVIDER_SITE_OTHER): Payer: Medicare Other | Admitting: Pulmonary Disease

## 2023-03-02 ENCOUNTER — Encounter: Payer: Self-pay | Admitting: Pulmonary Disease

## 2023-03-02 VITALS — BP 130/84 | HR 74 | Temp 97.8°F | Ht 62.5 in | Wt 184.0 lb

## 2023-03-02 DIAGNOSIS — E669 Obesity, unspecified: Secondary | ICD-10-CM | POA: Diagnosis not present

## 2023-03-02 DIAGNOSIS — J4489 Other specified chronic obstructive pulmonary disease: Secondary | ICD-10-CM

## 2023-03-02 DIAGNOSIS — E0843 Diabetes mellitus due to underlying condition with diabetic autonomic (poly)neuropathy: Secondary | ICD-10-CM

## 2023-03-02 DIAGNOSIS — J449 Chronic obstructive pulmonary disease, unspecified: Secondary | ICD-10-CM

## 2023-03-02 DIAGNOSIS — R0602 Shortness of breath: Secondary | ICD-10-CM | POA: Diagnosis not present

## 2023-03-02 DIAGNOSIS — M2042 Other hammer toe(s) (acquired), left foot: Secondary | ICD-10-CM

## 2023-03-02 DIAGNOSIS — G4736 Sleep related hypoventilation in conditions classified elsewhere: Secondary | ICD-10-CM | POA: Diagnosis not present

## 2023-03-02 NOTE — Progress Notes (Signed)
Subjective:    Patient ID: Megan Bradshaw, female    DOB: [birth year], 89 y.o.   MRN: [id number] Patient Care Team: Excell Seltzer, MD as PCP - General (Family Medicine) Phil Dopp[facility] as Pharmacist (Pharmacist) Salena Saner, MD as Consulting Physician (Pulmonary Disease)  Chief Complaint  Patient presents with   Follow-up    SOB with exertion. No wheezing or cough.   Pt profile: 89  y.o. F, former smoker, moved from [place] in 2015. Initially seen by Dr Sherene Sires. Diagnosed with COPD in 2011. Graded as Gold I by Dr.  Sherene Sires. Chronic prednisone therapy for non-pulmonary diagnosis.  Last PFTs 02/16/2016.  Followed by Dr. Sung Amabile since October 2016. Established with me in November 2020 after Dr. Sung Amabile' departure from the practice.   PROBLEMS: Gold II COPD with chronic asthmatic bronchitis Chronic refractory cough Class III dyspnea out of proportion to PFT parameters   DATA: 10/22/14 PFTs: mild obstruction, FEV1 1.36 L (81%), FEV1/FVC 53%, TLC normal, DLCO 51% pred 02/16/16 PFTs: FVC: 2.23 L (90 %pred), FEV1: 1.30 L (71 %pred), FEV1/FVC: 58% , TLC: invalid, DLCO 78% pred, consistent with mild/moderate obstructive defect 10/14/2019 2D echo: LVEF 50 to 55%, normal diastolics, normal pulmonary artery systolic pressure 03/31/2020 overnight oximetry on RA: No oxygen desaturations of clinical significance. Lowest O2 sat 88% of less than 2 minutes duration.  Average O2 sat 93% 04/28/2022 overnight oximetry on RA: Patient qualified for nocturnal oxygen.  Basal SPO2 was 91% nadir to 87% total of 216 oxygen desaturation events, ODI of 21 06/02/2022 PFTs: FEV1 1.12 L or 78% predicted, FVC is 2.15 L or 110% predicted.  FEV1/FVC 52%, no bronchodilator response.  Lung volumes were normal.  Diffusion capacity moderately reduced.  Consistent with moderately severe obstructive airways disease, restriction probable likely due to obesity.  Moderately severe diffusion defect/abnormality. Since the prior  study of 02/16/19/2017 there was a decrease in FEV1 by 180 mL. 06/27/2022 echocardiogram: LVEF 55%, normal wall motion abnormality, grade 1 diastolic dysfunction, RV function normal.  No valvular abnormalities.  No evidence of pulmonary hypertension.   INTERVAL: Last seen in this office in 28 October 2022 by me.  No new issues in the interval.  Dyspnea at baseline or slightly better.  Notes relief with Breo.  Compliant with nocturnal O2 Notes improvement on symptoms on therapy.  HPI Megan Bradshaw is an 89 year old former smoker (45 PY) who presents for follow-up on the issue of dyspnea and GOLD class II COPD.  Dyspnea out of proportion to her COPD.  This is a scheduled visit she was last seen on 28 October 2022 by me.  She believes dyspnea is more manageable lately.  She has been compliant with overnight oxygen and notices feeling refreshed in the morning and less fatigue during the day, this in turn has helped her dyspnea. The patient has been compliant with Breo Ellipta.  In the past she has tried Trelegy however finds that this does not offer any relief over Breo.  She has not had any cough or sputum production.  No recent sinus issues she has been sleeping well when she is able to wear the oxygen.  No nocturnal awakenings of late.  She has not needed rescue inhalation of albuterol lately.   Does not have any fevers, chills or sweats. No hemoptysis.She does not endorse any other new symptomatology today.  She appears spry and energetic today.   She recently traveled to [place] to be with family and had no difficulties during the trip.  She was able to "keep up with her family" without any difficulties.   Review of Systems A 10 point review of systems was performed and it is as noted above otherwise negative.  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) 09/22/2014   Hypothyroid 09/22/2014   COPD mixed type 08/12/2014   Social History   Tobacco Use   Smoking status: Former    Packs/day: 1.50    Years: 30.00    Additional pack years: 0.00    Total pack years: 45.00    Types: Cigarettes    Quit date: 11/15/1987    Years since quitting: 35.3   Smokeless tobacco: Never  Substance Use Topics   Alcohol use: No    Alcohol/week: 0.0 standard drinks of alcohol   Allergies  Allergen Reactions   Augmentin [Amoxicillin-Pot Clavulanate] Diarrhea   Current Meds  Medication Sig   ACCU-CHEK GUIDE test strip USE TO TEST TWICE DAILY   Accu-Chek Softclix Lancets lancets Use to check blood sugar 2 times a day   albuterol (PROVENTIL) (2.5 MG/3ML) 0.083% nebulizer solution Take 3 mLs (2.5 mg total) by nebulization every 4 (four) hours as needed for wheezing or shortness of breath.   albuterol (VENTOLIN HFA) 108 (90 Base) MCG/ACT inhaler INHALE 1 TO 2 PUFFS BY MOUTH EVERY 6 HOURS FOR PAIN OR WHEEZING OR SHORTNESS OF BREATH   B Complex-Folic Acid (BENFOTIAMINE MULTI-B) CAPS Take 1 capsule by mouth daily.   Blood Glucose Monitoring Suppl (ACCU-CHEK GUIDE ME) w/Device KIT See admin instructions.   Cholecalciferol (VITAMIN D PO) Take 1 tablet by mouth daily.   clobetasol cream (TEMOVATE) 0.05 % Apply 1 Application topically daily as needed.   fluticasone (FLONASE) 50 MCG/ACT nasal spray Place 2 sprays into both nostrils daily.   fluticasone furoate-vilanterol (BREO ELLIPTA) 100-25 MCG/ACT AEPB Inhale 1 puff into the lungs daily.   gabapentin (NEURONTIN) 100 MG capsule Take 1 capsule (100 mg total) by mouth 3 (three) times daily.    glipiZIDE (GLUCOTROL XL) 10 MG 24 hr tablet TAKE 1 TABLET(10 MG) BY MOUTH DAILY WITH BREAKFAST   ipratropium-albuterol (DUONEB) 0.5-2.5 (3) MG/3ML SOLN USE 3 ML VIA NEBULIZER EVERY 6 HOURS AS NEEDED   ketoconazole (NIZORAL) 2 % cream Apply 1 Application topically daily.   levothyroxine (SYNTHROID) 75 MCG tablet Take 1 tablet (75 mcg total) by mouth daily before breakfast.   metFORMIN (GLUCOPHAGE) 500 MG tablet TAKE 1 TABLET(500 MG) BY MOUTH TWICE DAILY WITH A MEAL   montelukast (SINGULAIR) 10 MG tablet TAKE 1 TABLET(10 MG) BY MOUTH AT BEDTIME   Multiple Vitamin (MULTIVITAMIN) capsule Take 1 capsule by mouth daily.   nystatin powder APPLY EXTERNALLY TO THE AFFECTED AREA TWICE DAILY   predniSONE (DELTASONE) 5 MG tablet Take 1 tablet (5 mg total) by mouth daily with breakfast.   Immunization History  Administered Date(s) Administered   Fluad Quad(high Dose 65+) 09/03/2020, 08/02/2021, 08/14/2022   Influenza,inj,Quad PF,6+ Mos 08/20/2015, 07/22/2016, 08/14/2017, 08/23/2018, 08/29/2019   Influenza-Unspecified 08/14/2014   PFIZER(Purple Top)SARS-COV-2 Vaccination 01/07/2020, 01/28/2020, 10/11/2020   Pneumococcal Conjugate-13 08/20/2015  Pneumococcal Polysaccharide-23 08/29/2019   Respiratory Syncytial Virus Vaccine,Recomb Aduvanted(Arexvy) 08/14/2022       Objective:   Physical Exam BP 130/84 (BP Location: Left Arm, Cuff Size: Normal)   Pulse 74   Temp 97.8 F (36.6 C)   Ht 5' 2.5" (1.588 m)   Wt 184 lb (83.5 kg) Comment: per patient. in a wheelchair today  SpO2 98%   BMI 33.12 kg/m   SpO2: 98 % O2 Device: None (Room air)  GENERAL: Overweight elderly female, quite spry, very well-groomed.  Presents in transport chair today. No conversational dyspnea. No distress. HEAD: Normocephalic, atraumatic. EYES: Pupils equal, round, reactive to light.  No scleral icterus. NOSE: No turbinate edema  MOUTH: Lower teeth in poor repair.  Oral mucosa moist. NECK: Supple. No thyromegaly. Trachea  midline. No JVD.  No adenopathy. PULMONARY: Good air entry bilaterally. Coarse with no other adventitious sounds. CARDIOVASCULAR: S1 and S2. Regular rate and rhythm. Soft grade 1/6 systolic ejection murmur left sternal border. No rubs or gallops noted. ABDOMEN: Obese,nondistended. MUSCULOSKELETAL: No joint deformity, no clubbing, trace ankle edema.  She has increased AP diameter due to kyphosis.  No chest wall tenderness. NEUROLOGIC: No overt focal deficits noted. Speech is fluent. SKIN: Intact,warm,dry. On limited exam no rashes. PSYCH: Mood and behavior normal.     Assessment & Plan:     ICD-10-CM   1. Stage 2 moderate COPD by GOLD classification  J44.9    She is well compensated on her current regimen Continue Breo Ellipta and as needed albuterol    2. Nocturnal hypoxemia due to obstructive chronic bronchitis  J44.89    G47.36    Oxygen at 2 L/min Patient compliant with therapy Patient notes benefit of therapy    3. SOB (shortness of breath)  R06.02    Out of proportion to COPD Obesity/deconditioning play a part Declines pulmonary rehab    4. Obesity (BMI 30.0-34.9)  E66.9    Weight loss recommended     Overall Altie is doing well.  She continues to have issues with dyspnea however overall she feels that this has been more bearable lately.  She does well with nocturnal oxygen.  Feels that Virgel Bouquet helps her during the day.  She states she participated in pulmonary rehab in the past and is not interested in reenrolling.  We will see her in follow-up in 4 to 6 weeks time call sooner should any new problems arise.  Gailen Shelter, MD Advanced Bronchoscopy PCCM [facility]    *This note was dictated using voice recognition software/Dragon.  Despite best efforts to proofread, errors can occur which can change the meaning. Any transcriptional errors that result from this process are unintentional and may not be fully corrected at the time of dictation.

## 2023-03-02 NOTE — Patient Instructions (Signed)
Your lungs sounded really clear today.  Continue using your Breo.  Continue your oxygen at nighttime.  We will see you in follow-up in 4 to 6 months time call sooner should any new problems arise.

## 2023-03-14 ENCOUNTER — Ambulatory Visit (INDEPENDENT_AMBULATORY_CARE_PROVIDER_SITE_OTHER): Payer: Medicare Other | Admitting: Family Medicine

## 2023-03-14 ENCOUNTER — Encounter: Payer: Self-pay | Admitting: Family Medicine

## 2023-03-14 VITALS — BP 130/70 | HR 68 | Temp 97.9°F | Ht 62.5 in | Wt 181.5 lb

## 2023-03-14 DIAGNOSIS — R0981 Nasal congestion: Secondary | ICD-10-CM | POA: Diagnosis not present

## 2023-03-14 DIAGNOSIS — J449 Chronic obstructive pulmonary disease, unspecified: Secondary | ICD-10-CM | POA: Diagnosis not present

## 2023-03-14 DIAGNOSIS — R197 Diarrhea, unspecified: Secondary | ICD-10-CM

## 2023-03-14 MED ORDER — MONTELUKAST SODIUM 10 MG PO TABS: ORAL_TABLET | ORAL | 11 refills | Status: DC

## 2023-03-14 NOTE — Assessment & Plan Note (Signed)
Chronic, intermittent Most likely secondary to allergies/chronic sinus issues.  Recommended for her to restart Flonase 2 sprays per nostril daily.  She will also restart Singulair 10 mg p.o. nightly.

## 2023-03-14 NOTE — Patient Instructions (Signed)
Restart Singulair and Flonase 2 sprays per nostrils daily.  {Push fluids, rest, can use immodium as needed for diarrhea but if it is persistent call for further evaluation like stool culture.

## 2023-03-14 NOTE — Assessment & Plan Note (Signed)
Chronic, no current COPD exacerbation.

## 2023-03-14 NOTE — Progress Notes (Signed)
Patient ID: Megan Bradshaw, female    DOB: May 28, 1930, 89 y.o.   MRN: [id number]  This visit was conducted in person.  BP 130/70   Pulse 68   Temp 97.9 F (36.6 C) (Temporal)   Ht 5' 2.5" (1.588 m)   Wt 181 lb 8 oz (82.3 kg)   SpO2 95%   BMI 32.67 kg/m    CC:  Chief Complaint  Patient presents with   Diarrhea   Sinus Problem    Subjective:   HPI: Megan Bradshaw is a 89 y.o. female presenting on 03/14/2023 for Diarrhea and Sinus Problem  Seen on 02/08/2023 dx with bacterial sinusitis and COPD exacerbation.. treated with Amoxicillin and prednisone taper. ( She states she never filled the amoxicillin.)  Had follow up with Pulmonary on 03/02/2023 reviewed noted. Using Breo inhaler.   Date of onset diarrhea 1 week ago.Marland Kitchen awoke with  emesis and diarrhea... since then she has been having flatulence.  Eating lighter soup.Marland Kitchen diarrhea following.  Did have mayo that was expired in egg sandwich earlier that day.  Today no BM.  No fever.  No sick contacts.  Occ lower abdominal pain.     Still with sinus congestion.. No facial pain, no change in breahting, stable wheeze.  No cough. Has not been on Singulair for a while.  Has stopped Flonase in the last week given nausea        Relevant past medical, surgical, family and social history reviewed and updated as indicated. Interim medical history since our last visit reviewed. Allergies and medications reviewed and updated. Outpatient Medications Prior to Visit  Medication Sig Dispense Refill   ACCU-CHEK GUIDE test strip USE TO TEST TWICE DAILY 200 strip 1   Accu-Chek Softclix Lancets lancets Use to check blood sugar 2 times a day 100 each 5   albuterol (PROVENTIL) (2.5 MG/3ML) 0.083% nebulizer solution Take 3 mLs (2.5 mg total) by nebulization every 4 (four) hours as needed for wheezing or shortness of breath. 150 mL 5   albuterol (VENTOLIN HFA) 108 (90 Base) MCG/ACT inhaler INHALE 1 TO 2 PUFFS BY MOUTH EVERY 6 HOURS FOR PAIN OR  WHEEZING OR SHORTNESS OF BREATH 18 g 2   B Complex-Folic Acid (BENFOTIAMINE MULTI-B) CAPS Take 1 capsule by mouth daily. 30 capsule 6   Blood Glucose Monitoring Suppl (ACCU-CHEK GUIDE ME) w/Device KIT See admin instructions.     Cholecalciferol (VITAMIN D PO) Take 1 tablet by mouth daily.     clobetasol cream (TEMOVATE) 0.05 % Apply 1 Application topically daily as needed. 30 g 1   fluticasone (FLONASE) 50 MCG/ACT nasal spray Place 2 sprays into both nostrils daily. 16 g 2   fluticasone furoate-vilanterol (BREO ELLIPTA) 100-25 MCG/ACT AEPB Inhale 1 puff into the lungs daily. 180 each 2   gabapentin (NEURONTIN) 100 MG capsule Take 1 capsule (100 mg total) by mouth 3 (three) times daily. 180 capsule 3   glipiZIDE (GLUCOTROL XL) 10 MG 24 hr tablet TAKE 1 TABLET(10 MG) BY MOUTH DAILY WITH BREAKFAST 90 tablet 0   ipratropium-albuterol (DUONEB) 0.5-2.5 (3) MG/3ML SOLN USE 3 ML VIA NEBULIZER EVERY 6 HOURS AS NEEDED 360 mL 1   ketoconazole (NIZORAL) 2 % cream Apply 1 Application topically daily. 15 g 0   levothyroxine (SYNTHROID) 75 MCG tablet Take 1 tablet (75 mcg total) by mouth daily before breakfast. 90 tablet 3   metFORMIN (GLUCOPHAGE) 500 MG tablet TAKE 1 TABLET(500 MG) BY MOUTH TWICE DAILY WITH A MEAL 180 tablet  3   Multiple Vitamin (MULTIVITAMIN) capsule Take 1 capsule by mouth daily.     nystatin powder APPLY EXTERNALLY TO THE AFFECTED AREA TWICE DAILY 30 g 1   predniSONE (DELTASONE) 5 MG tablet Take 1 tablet (5 mg total) by mouth daily with breakfast. 30 tablet 3   amoxicillin (AMOXIL) 500 MG capsule Take 2 capsules (1,000 mg total) by mouth 2 (two) times daily. Hold, fill if not improving with pprednisonein 3-4 days. (Patient not taking: Reported on 03/02/2023) 40 capsule 0   montelukast (SINGULAIR) 10 MG tablet TAKE 1 TABLET(10 MG) BY MOUTH AT BEDTIME 30 tablet 3   predniSONE (DELTASONE) 10 MG tablet Take 1 tablet (10 mg total) by mouth daily with breakfast. (Patient not taking: Reported on  03/02/2023) 15 tablet 0   No facility-administered medications prior to visit.     Per HPI unless specifically indicated in ROS section below Review of Systems  Constitutional:  Negative for fatigue and fever.  HENT:  Positive for congestion.   Eyes:  Negative for pain.  Respiratory:  Negative for cough and shortness of breath.   Cardiovascular:  Negative for chest pain, palpitations and leg swelling.  Gastrointestinal:  Positive for diarrhea, nausea and vomiting. Negative for abdominal pain and blood in stool.  Genitourinary:  Negative for dysuria and vaginal bleeding.  Musculoskeletal:  Negative for back pain.  Neurological:  Negative for syncope, light-headedness and headaches.  Psychiatric/Behavioral:  Negative for dysphoric mood.    Objective:  BP 130/70   Pulse 68   Temp 97.9 F (36.6 C) (Temporal)   Ht 5' 2.5" (1.588 m)   Wt 181 lb 8 oz (82.3 kg)   SpO2 95%   BMI 32.67 kg/m   Wt Readings from Last 3 Encounters:  03/14/23 181 lb 8 oz (82.3 kg)  03/02/23 184 lb (83.5 kg)  02/08/23 187 lb 2 oz (84.9 kg)      Physical Exam Constitutional:      General: She is not in acute distress.    Appearance: Normal appearance. She is well-developed. She is not ill-appearing or toxic-appearing.  HENT:     Head: Normocephalic.     Right Ear: Hearing, tympanic membrane, ear canal and external ear normal. Tympanic membrane is not erythematous, retracted or bulging.     Left Ear: Hearing, tympanic membrane, ear canal and external ear normal. Tympanic membrane is not erythematous, retracted or bulging.     Nose: No mucosal edema or rhinorrhea.     Right Sinus: No maxillary sinus tenderness or frontal sinus tenderness.     Left Sinus: No maxillary sinus tenderness or frontal sinus tenderness.     Mouth/Throat:     Pharynx: Uvula midline.  Eyes:     General: Lids are normal. Lids are everted, no foreign bodies appreciated.     Conjunctiva/sclera: Conjunctivae normal.     Pupils:  Pupils are equal, round, and reactive to light.  Neck:     Thyroid: No thyroid mass or thyromegaly.     Vascular: No carotid bruit.     Trachea: Trachea normal.  Cardiovascular:     Rate and Rhythm: Normal rate and regular rhythm.     Pulses: Normal pulses.     Heart sounds: Normal heart sounds, S1 normal and S2 normal. No murmur heard.    No friction rub. No gallop.  Pulmonary:     Effort: Pulmonary effort is normal. No tachypnea or respiratory distress.     Breath sounds: Normal breath sounds. No  decreased breath sounds, wheezing, rhonchi or rales.  Abdominal:     General: Bowel sounds are normal.     Palpations: Abdomen is soft.     Tenderness: There is no abdominal tenderness.  Musculoskeletal:     Cervical back: Normal range of motion and neck supple.  Skin:    General: Skin is warm and dry.     Findings: No rash.  Neurological:     Mental Status: She is alert.  Psychiatric:        Mood and Affect: Mood is not anxious or depressed.        Speech: Speech normal.        Behavior: Behavior normal. Behavior is cooperative.        Thought Content: Thought content normal.        Judgment: Judgment normal.       Results for orders placed or performed in visit on 01/19/23  Microalbumin / creatinine urine ratio  Result Value Ref Range   Microalb, Ur <0.7 0.0 - 1.9 mg/dL   Creatinine,U 16.1 mg/dL   Microalb Creat Ratio 0.9 0.0 - 30.0 mg/g  HM DIABETES FOOT EXAM  Result Value Ref Range   HM Diabetic Foot Exam done     Assessment and Plan  Acute diarrhea Assessment & Plan: Acute, given recent history, most likely viral gastroenteritis versus exposure to foodborne toxin. Symptoms are improving.  Encouraged hydration, rest and can use Imodium as needed. No recent antibiotic use or hospitalization so C. difficile less likely.  Recommend further evaluation if symptoms continue.   Return and ER precautions provided   Nasal congestion Assessment & Plan: Chronic,  intermittent Most likely secondary to allergies/chronic sinus issues.  Recommended for her to restart Flonase 2 sprays per nostril daily.  She will also restart Singulair 10 mg p.o. nightly.   COPD mixed type [facility]) Assessment & Plan: Chronic, no current COPD exacerbation.   Other orders -     Montelukast Sodium; TAKE 1 TABLET(10 MG) BY MOUTH AT BEDTIME  Dispense: 30 tablet; Refill: 11    No follow-ups on file.   Kerby Nora, MD

## 2023-03-14 NOTE — Assessment & Plan Note (Signed)
Acute, given recent history, most likely viral gastroenteritis versus exposure to foodborne toxin. Symptoms are improving.  Encouraged hydration, rest and can use Imodium as needed. No recent antibiotic use or hospitalization so C. difficile less likely.  Recommend further evaluation if symptoms continue.   Return and ER precautions provided

## 2023-03-16 DIAGNOSIS — H353131 Nonexudative age-related macular degeneration, bilateral, early dry stage: Secondary | ICD-10-CM | POA: Diagnosis not present

## 2023-03-16 DIAGNOSIS — E113293 Type 2 diabetes mellitus with mild nonproliferative diabetic retinopathy without macular edema, bilateral: Secondary | ICD-10-CM | POA: Diagnosis not present

## 2023-03-25 ENCOUNTER — Other Ambulatory Visit: Payer: Self-pay | Admitting: Family Medicine

## 2023-03-27 NOTE — Telephone Encounter (Signed)
Last office visit 03/14/2023 for Diarrhea, nasal congestion and COPD.  Last refilled 11/17/2022 for #30 with 3 refills.  Next appt:  No future appointments with PCP.

## 2023-03-28 NOTE — Progress Notes (Signed)
Patient presents to the office today for diabetic shoe and insole measuring.   ABN signed.   Documentation of medical necessity will be sent to patient's treating diabetic doctor to verify and sign.   Patient's diabetic provider: DR Ermalene Searing  Shoes and insoles will be ordered at that time and patient will be notified for an appointment for fitting when they arrive.   Patient shoe selection-   1st   Shoe choice:   [id number]

## 2023-04-03 ENCOUNTER — Telehealth: Payer: Self-pay

## 2023-04-03 NOTE — Patient Outreach (Signed)
  Care Coordination   Initial Visit Note   04/03/2023 Name: Megan Bradshaw MRN: [id number] DOB: 1930-10-26  Megan Bradshaw is a 89 y.o. year old female who sees Megan Seltzer, MD for primary care. I spoke with  Megan Bradshaw by phone today.  What matters to the patients health and wellness today?  Patient verbalized no nursing or community resource needs.  She states placing recent order for diabetic shoes.  Still waiting to receive.   Patient states she exercises.  She states she is going today to walk in the pool.    Goals Addressed             This Visit's Progress    COMPLETED: Care coordination activities - no follow up needed.       Interventions Today    Flowsheet Row Most Recent Value  Chronic Disease   Chronic disease during today's visit Diabetes  General Interventions   General Interventions Discussed/Reviewed General Interventions Discussed, Labs  [Care coordination services discussed.  SDOH survey completed. AWV discussed and confirmed patient completed. Vaccines discussed. Advised to contact primary care provider office  if care coordination services needed in the future]  Labs Hgb A1c every 6 months  [Discussed most recent Hgb A1c.  Congratulated patient on nearing goal, currently Hgb A1c 7.3.]              SDOH assessments and interventions completed:  Yes  SDOH Interventions Today    Flowsheet Row Most Recent Value  SDOH Interventions   Food Insecurity Interventions Intervention Not Indicated  Housing Interventions Intervention Not Indicated  Transportation Interventions Intervention Not Indicated        Care Coordination Interventions:  Yes, provided   Follow up plan: No further intervention required.   Encounter Outcome:  Pt. Visit Completed   George Ina RN,BSN,CCM [facility] Care Coordination [phone] direct line

## 2023-04-11 ENCOUNTER — Ambulatory Visit (INDEPENDENT_AMBULATORY_CARE_PROVIDER_SITE_OTHER): Payer: Medicare Other | Admitting: Dermatology

## 2023-04-11 ENCOUNTER — Encounter: Payer: Self-pay | Admitting: Dermatology

## 2023-04-11 VITALS — BP 149/85

## 2023-04-11 DIAGNOSIS — L578 Other skin changes due to chronic exposure to nonionizing radiation: Secondary | ICD-10-CM | POA: Diagnosis not present

## 2023-04-11 DIAGNOSIS — Z1283 Encounter for screening for malignant neoplasm of skin: Secondary | ICD-10-CM

## 2023-04-11 DIAGNOSIS — L814 Other melanin hyperpigmentation: Secondary | ICD-10-CM | POA: Diagnosis not present

## 2023-04-11 DIAGNOSIS — L570 Actinic keratosis: Secondary | ICD-10-CM | POA: Diagnosis not present

## 2023-04-11 DIAGNOSIS — L820 Inflamed seborrheic keratosis: Secondary | ICD-10-CM

## 2023-04-11 DIAGNOSIS — D1801 Hemangioma of skin and subcutaneous tissue: Secondary | ICD-10-CM

## 2023-04-11 DIAGNOSIS — D1721 Benign lipomatous neoplasm of skin and subcutaneous tissue of right arm: Secondary | ICD-10-CM | POA: Diagnosis not present

## 2023-04-11 DIAGNOSIS — W908XXA Exposure to other nonionizing radiation, initial encounter: Secondary | ICD-10-CM

## 2023-04-11 DIAGNOSIS — L821 Other seborrheic keratosis: Secondary | ICD-10-CM

## 2023-04-11 DIAGNOSIS — X32XXXA Exposure to sunlight, initial encounter: Secondary | ICD-10-CM

## 2023-04-11 DIAGNOSIS — D229 Melanocytic nevi, unspecified: Secondary | ICD-10-CM

## 2023-04-11 NOTE — Progress Notes (Signed)
Follow-Up Visit   Subjective  Megan Bradshaw is a 89 y.o. female who presents for the following: Skin Cancer Screening and Upper Body Skin Exam, check growths L neck, irritated by clothing, L leg  The patient presents for Upper Body Skin Exam (UBSE) for skin cancer screening and mole check. The patient has spots, moles and lesions to be evaluated, some may be new or changing and the patient has concerns that these could be cancer.    The following portions of the chart were reviewed this encounter and updated as appropriate: medications, allergies, medical history  Review of Systems:  No other skin or systemic complaints except as noted in HPI or Assessment and Plan.  Objective  Well appearing patient in no apparent distress; mood and affect are within normal limits.  All skin waist up examined. Relevant physical exam findings are noted in the Assessment and Plan.  L lower neck x 1 Stuck on waxy papule with erythema  R lat lower canthus x 1 Pink scaly macule    Assessment & Plan   Inflamed seborrheic keratosis L lower neck x 1  Symptomatic, irritating, patient would like treated.   Destruction of lesion - L lower neck x 1  Destruction method: cryotherapy   Informed consent: discussed and consent obtained   Lesion destroyed using liquid nitrogen: Yes   Region frozen until ice ball extended beyond lesion: Yes   Outcome: patient tolerated procedure well with no complications   Post-procedure details: wound care instructions given   Additional details:  Prior to procedure, discussed risks of blister formation, small wound, skin dyspigmentation, or rare scar following cryotherapy. Recommend Vaseline ointment to treated areas while healing.   AK (actinic keratosis) R lat lower canthus x 1  Destruction of lesion - R lat lower canthus x 1  Destruction method: cryotherapy   Informed consent: discussed and consent obtained   Lesion destroyed using liquid nitrogen: Yes    Region frozen until ice ball extended beyond lesion: Yes   Outcome: patient tolerated procedure well with no complications   Post-procedure details: wound care instructions given   Additional details:  Prior to procedure, discussed risks of blister formation, small wound, skin dyspigmentation, or rare scar following cryotherapy. Recommend Vaseline ointment to treated areas while healing.    Lentigines, Seborrheic Keratoses, Hemangiomas - Benign normal skin lesions - Benign-appearing - Call for any changes  Melanocytic Nevi - Tan-brown and/or pink-flesh-colored symmetric macules and papules - Benign appearing on exam today - Observation - Call clinic for new or changing moles - Recommend daily use of broad spectrum spf 30+ sunscreen to sun-exposed areas.   Actinic Damage - Chronic condition, secondary to cumulative UV/sun exposure - diffuse scaly erythematous macules with underlying dyspigmentation - Recommend daily broad spectrum sunscreen SPF 30+ to sun-exposed areas, reapply every 2 hours as needed.  - Staying in the shade or wearing long sleeves, sun glasses (UVA+UVB protection) and wide brim hats (4-inch brim around the entire circumference of the hat) are also recommended for sun protection.  - Call for new or changing lesions.  Skin cancer screening performed today.  Lipoma  Exam: Subcutaneous rubbery nodule  Location: R forearm  Benign-appearing. Exam most consistent with a lipoma. Discussed that a lipoma is a benign fatty growth that can grow over time and sometimes get irritated. Recommend observation if it is not bothersome or changing. Discussed option of ILK injections or surgical excision to remove it if it is growing, symptomatic, or other changes noted.  Please call for new or changing lesions so they can be evaluated.   SEBORRHEIC KERATOSIS Exam Stuck-on, waxy, tan-brown papule L post thigh  Treatment: Benign-appearing.  Observation.  Call clinic for new or  changing moles.  Recommend daily use of broad spectrum spf 30+ sunscreen to sun-exposed areas.     Return if symptoms worsen or fail to improve.  I, Ardis Rowan, RMA, am acting as scribe for Megan Niece, MD .   Documentation: I have reviewed the above documentation for accuracy and completeness, and I agree with the above.  Megan Niece, MD

## 2023-04-11 NOTE — Patient Instructions (Signed)
Cryotherapy Aftercare  Wash gently with soap and water everyday.   Apply Vaseline and Band-Aid daily until healed.     Due to recent changes in healthcare laws, you may see results of your pathology and/or laboratory studies on MyChart before the doctors have had a chance to review them. We understand that in some cases there may be results that are confusing or concerning to you. Please understand that not all results are received at the same time and often the doctors may need to interpret multiple results in order to provide you with the best plan of care or course of treatment. Therefore, we ask that you please give us 2 business days to thoroughly review all your results before contacting the office for clarification. Should we see a critical lab result, you will be contacted sooner.   If You Need Anything After Your Visit  If you have any questions or concerns for your doctor, please call our main line at [phone] and press option 4 to reach your doctor's medical assistant. If no one answers, please leave a voicemail as directed and we will return your call as soon as possible. Messages left after 4 pm will be answered the following business day.   You may also send us a message via MyChart. We typically respond to MyChart messages within 1-2 business days.  For prescription refills, please ask your pharmacy to contact our office. Our fax number is [phone].  If you have an urgent issue when the clinic is closed that cannot wait until the next business day, you can page your doctor at the number below.    Please note that while we do our best to be available for urgent issues outside of office hours, we are not available 24/7.   If you have an urgent issue and are unable to reach us, you may choose to seek medical care at your doctor's office, retail clinic, urgent care center, or emergency room.  If you have a medical emergency, please immediately call 911 or go to the  emergency department.  Pager Numbers  - Dr. Kowalski: [phone]  - Dr. Moye: [phone]  - Dr. Stewart: [phone]  In the event of inclement weather, please call our main line at [phone] for an update on the status of any delays or closures.  Dermatology Medication Tips: Please keep the boxes that topical medications come in in order to help keep track of the instructions about where and how to use these. Pharmacies typically print the medication instructions only on the boxes and not directly on the medication tubes.   If your medication is too expensive, please contact our office at [phone] option 4 or send us a message through MyChart.   We are unable to tell what your co-pay for medications will be in advance as this is different depending on your insurance coverage. However, we may be able to find a substitute medication at lower cost or fill out paperwork to get insurance to cover a needed medication.   If a prior authorization is required to get your medication covered by your insurance company, please allow us 1-2 business days to complete this process.  Drug prices often vary depending on where the prescription is filled and some pharmacies may offer cheaper prices.  The website www.goodrx.com contains coupons for medications through different pharmacies. The prices here do not account for what the cost may be with help from insurance (it may be cheaper with your insurance), but the website can   give you the price if you did not use any insurance.  - You can print the associated coupon and take it with your prescription to the pharmacy.  - You may also stop by our office during regular business hours and pick up a GoodRx coupon card.  - If you need your prescription sent electronically to a different pharmacy, notify our office through [facility] MyChart or by phone at [phone] option 4.     Si Usted Necesita Algo Despus de Su Visita  Tambin puede  enviarnos un mensaje a travs de MyChart. Por lo general respondemos a los mensajes de MyChart en el transcurso de 1 a 2 das hbiles.  Para renovar recetas, por favor pida a su farmacia que se ponga en contacto con nuestra oficina. Nuestro nmero de fax es el [phone].  Si tiene un asunto urgente cuando la clnica est cerrada y que no puede esperar hasta el siguiente da hbil, puede llamar/localizar a su doctor(a) al nmero que aparece a continuacin.   Por favor, tenga en cuenta que aunque hacemos todo lo posible para estar disponibles para asuntos urgentes fuera del horario de oficina, no estamos disponibles las 24 horas del da, los 7 das de la semana.   Si tiene un problema urgente y no puede comunicarse con nosotros, puede optar por buscar atencin mdica  en el consultorio de su doctor(a), en una clnica privada, en un centro de atencin urgente o en una sala de emergencias.  Si tiene una emergencia mdica, por favor llame inmediatamente al 911 o vaya a la sala de emergencias.  Nmeros de bper  - Dr. Kowalski: [phone]  - Dra. Moye: [phone]  - Dra. Stewart: [phone]  En caso de inclemencias del tiempo, por favor llame a nuestra lnea principal al [phone] para una actualizacin sobre el estado de cualquier retraso o cierre.  Consejos para la medicacin en dermatologa: Por favor, guarde las cajas en las que vienen los medicamentos de uso tpico para ayudarle a seguir las instrucciones sobre dnde y cmo usarlos. Las farmacias generalmente imprimen las instrucciones del medicamento slo en las cajas y no directamente en los tubos del medicamento.   Si su medicamento es muy caro, por favor, pngase en contacto con nuestra oficina llamando al [phone] y presione la opcin 4 o envenos un mensaje a travs de MyChart.   No podemos decirle cul ser su copago por los medicamentos por adelantado ya que esto es diferente dependiendo de la cobertura de su seguro.  Sin embargo, es posible que podamos encontrar un medicamento sustituto a menor costo o llenar un formulario para que el seguro cubra el medicamento que se considera necesario.   Si se requiere una autorizacin previa para que su compaa de seguros cubra su medicamento, por favor permtanos de 1 a 2 das hbiles para completar este proceso.  Los precios de los medicamentos varan con frecuencia dependiendo del lugar de dnde se surte la receta y alguna farmacias pueden ofrecer precios ms baratos.  El sitio web www.goodrx.com tiene cupones para medicamentos de diferentes farmacias. Los precios aqu no tienen en cuenta lo que podra costar con la ayuda del seguro (puede ser ms barato con su seguro), pero el sitio web puede darle el precio si no utiliz ningn seguro.  - Puede imprimir el cupn correspondiente y llevarlo con su receta a la farmacia.  - Tambin puede pasar por nuestra oficina durante el horario de atencin regular y recoger una tarjeta de cupones de GoodRx.  -   Si necesita que su receta se enve electrnicamente a una farmacia diferente, informe a nuestra oficina a travs de MyChart de [facility] o por telfono llamando al [phone] y presione la opcin 4.  

## 2023-04-26 ENCOUNTER — Encounter: Payer: Self-pay | Admitting: Family Medicine

## 2023-04-26 ENCOUNTER — Ambulatory Visit (INDEPENDENT_AMBULATORY_CARE_PROVIDER_SITE_OTHER): Payer: Medicare Other | Admitting: Family Medicine

## 2023-04-26 VITALS — BP 138/60 | HR 88 | Temp 97.9°F | Ht 62.5 in | Wt 180.1 lb

## 2023-04-26 DIAGNOSIS — L03115 Cellulitis of right lower limb: Secondary | ICD-10-CM

## 2023-04-26 DIAGNOSIS — S8011XA Contusion of right lower leg, initial encounter: Secondary | ICD-10-CM

## 2023-04-26 MED ORDER — CEPHALEXIN 500 MG PO CAPS: 500.0000 mg | ORAL_CAPSULE | Freq: Three times a day (TID) | ORAL | 0 refills | Status: DC

## 2023-04-26 NOTE — Progress Notes (Signed)
Shayn Madole T. Jaxon Mynhier, MD, CAQ Sports Medicine [facility] [address] [place], [address]  Phone: [phone]  FAX: [phone]  Eulogia Ron - 89 y.o. female  MRN [id number]  Date of Birth: 1930-02-28  Date: 04/26/2023  PCP: Excell Seltzer, MD  Referral: Excell Seltzer, MD  Chief Complaint  Patient presents with   Fall    2 weeks ago   Leg Injury    Right   Subjective:   Noralyn Anderberg is a 89 y.o. very pleasant female patient with Body mass index is 32.42 kg/m. who presents with the following:  2 weeks ago, fell and hit her R leg.  Since then, she has had some relatively diffuse bruising and some swelling in the right lower extremity and the soft tissue.  She has not had any pain along the tibia or in the foot, ankle, or the knee. Has been working out at the pool every day.  Over the last 2 days, there has been some redness and new swelling on the right lower extremity.  It has become tender to palpation and tender at rest.  Last night it hurt quite a bit more.   Review of Systems is noted in the HPI, as appropriate  Objective:   BP 138/60 (BP Location: Left Arm, Patient Position: Sitting, Cuff Size: Large)   Pulse 88   Temp 97.9 F (36.6 C) (Temporal)   Ht 5' 2.5" (1.588 m)   Wt 180 lb 2 oz (81.7 kg)   SpO2 95%   BMI 32.42 kg/m   GEN: No acute distress; alert,appropriate. PULM: Breathing comfortably in no respiratory distress PSYCH: Normally interactive.   Right lower extremity with evidence of hematoma and all lot of bruising.  In the middle third of the right lower extremity there is a pinkish coloration to the tissue.  It is tender to palpation.  I do not appreciate any specific warmth.  Laboratory and Imaging Data:  Assessment and Plan:     ICD-10-CM   1. Cellulitis of right leg  L03.115     2. Hematoma of right lower leg  S80.11XA      Obvious hematoma.  I think that the reddish coloration, change  in sensation over the last 48 hours and some pain would be concerning for new or early onset cellulitis.  I am getting give the patient a round of some Keflex to try to help and treat.  Medication Management during today's office visit: Meds ordered this encounter  Medications   cephALEXin (KEFLEX) 500 MG capsule    Sig: Take 1 capsule (500 mg total) by mouth 3 (three) times daily.    Dispense:  21 capsule    Refill:  0   There are no discontinued medications.  Orders placed today for conditions managed today: No orders of the defined types were placed in this encounter.   Disposition: No follow-ups on file.  [employer] One speech-to-text software was used for transcription in this dictation.  Possible transcriptional errors can occur using Animal nutritionist.   Signed,  Elpidio Galea. Gerold Sar, MD   Outpatient Encounter Medications as of 04/26/2023  Medication Sig   ACCU-CHEK GUIDE test strip USE TO TEST TWICE DAILY   Accu-Chek Softclix Lancets lancets Use to check blood sugar 2 times a day   albuterol (PROVENTIL) (2.5 MG/3ML) 0.083% nebulizer solution Take 3 mLs (2.5 mg total) by nebulization every 4 (four) hours as needed for wheezing or shortness of  breath.   albuterol (VENTOLIN HFA) 108 (90 Base) MCG/ACT inhaler INHALE 1 TO 2 PUFFS BY MOUTH EVERY 6 HOURS FOR PAIN OR WHEEZING OR SHORTNESS OF BREATH   B Complex-Folic Acid (BENFOTIAMINE MULTI-B) CAPS Take 1 capsule by mouth daily.   Blood Glucose Monitoring Suppl (ACCU-CHEK GUIDE ME) w/Device KIT See admin instructions.   cephALEXin (KEFLEX) 500 MG capsule Take 1 capsule (500 mg total) by mouth 3 (three) times daily.   Cholecalciferol (VITAMIN D PO) Take 1 tablet by mouth daily.   clobetasol cream (TEMOVATE) 0.05 % Apply 1 Application topically daily as needed.   fluticasone (FLONASE) 50 MCG/ACT nasal spray Place 2 sprays into both nostrils daily.   fluticasone furoate-vilanterol (BREO ELLIPTA) 100-25 MCG/ACT AEPB Inhale 1 puff  into the lungs daily.   gabapentin (NEURONTIN) 100 MG capsule Take 1 capsule (100 mg total) by mouth 3 (three) times daily.   glipiZIDE (GLUCOTROL XL) 10 MG 24 hr tablet TAKE 1 TABLET(10 MG) BY MOUTH DAILY WITH BREAKFAST   ipratropium-albuterol (DUONEB) 0.5-2.5 (3) MG/3ML SOLN USE 3 ML VIA NEBULIZER EVERY 6 HOURS AS NEEDED   ketoconazole (NIZORAL) 2 % cream Apply 1 Application topically daily.   levothyroxine (SYNTHROID) 75 MCG tablet TAKE 1 TABLET(75 MCG) BY MOUTH DAILY BEFORE BREAKFAST   metFORMIN (GLUCOPHAGE) 500 MG tablet TAKE 1 TABLET(500 MG) BY MOUTH TWICE DAILY WITH A MEAL   montelukast (SINGULAIR) 10 MG tablet TAKE 1 TABLET(10 MG) BY MOUTH AT BEDTIME   Multiple Vitamin (MULTIVITAMIN) capsule Take 1 capsule by mouth daily.   nystatin powder APPLY EXTERNALLY TO THE AFFECTED AREA TWICE DAILY   predniSONE (DELTASONE) 5 MG tablet TAKE 1 TABLET(5 MG) BY MOUTH DAILY WITH BREAKFAST   No facility-administered encounter medications on file as of 04/26/2023.

## 2023-05-05 ENCOUNTER — Ambulatory Visit (INDEPENDENT_AMBULATORY_CARE_PROVIDER_SITE_OTHER): Payer: Medicare Other | Admitting: Podiatry

## 2023-05-05 DIAGNOSIS — E0843 Diabetes mellitus due to underlying condition with diabetic autonomic (poly)neuropathy: Secondary | ICD-10-CM

## 2023-05-05 DIAGNOSIS — M2042 Other hammer toe(s) (acquired), left foot: Secondary | ICD-10-CM

## 2023-05-05 NOTE — Progress Notes (Unsigned)
Patient presents today to pick up diabetic shoes and insoles.  Patient was dispensed 1 pair of diabetic shoes and 3 pairs of foam casted diabetic insoles. Fit was unsatisfactory. Instructions for break-in and wear was reviewed and a copy was given to the patient.  Shoes were too small and too narrow , not the shoes she ordered.   Reordering x2440w 7.5w  Re-appointment for regularly scheduled diabetic foot care visits or if they should experience any trouble with the shoes or insoles.

## 2023-05-15 ENCOUNTER — Other Ambulatory Visit: Payer: Self-pay | Admitting: Podiatry

## 2023-05-16 ENCOUNTER — Encounter: Payer: Self-pay | Admitting: Family Medicine

## 2023-05-16 ENCOUNTER — Ambulatory Visit (INDEPENDENT_AMBULATORY_CARE_PROVIDER_SITE_OTHER): Payer: Medicare Other | Admitting: Family Medicine

## 2023-05-16 VITALS — BP 140/76 | HR 78 | Temp 97.8°F | Ht 62.5 in | Wt 181.2 lb

## 2023-05-16 DIAGNOSIS — R3915 Urgency of urination: Secondary | ICD-10-CM | POA: Insufficient documentation

## 2023-05-16 DIAGNOSIS — N3281 Overactive bladder: Secondary | ICD-10-CM | POA: Diagnosis not present

## 2023-05-16 DIAGNOSIS — B372 Candidiasis of skin and nail: Secondary | ICD-10-CM | POA: Diagnosis not present

## 2023-05-16 LAB — POC URINALSYSI DIPSTICK (AUTOMATED)
Bilirubin, UA: NEGATIVE
Glucose, UA: NEGATIVE
Ketones, UA: NEGATIVE
Protein, UA: NEGATIVE
Spec Grav, UA: 1.015 (ref 1.010–1.025)
Urobilinogen, UA: 0.2 E.U./dL
pH, UA: 5.5 (ref 5.0–8.0)

## 2023-05-16 MED ORDER — NYSTATIN 100000 UNIT/GM EX CREA: 1.0000 | TOPICAL_CREAM | Freq: Two times a day (BID) | CUTANEOUS | 5 refills | Status: DC

## 2023-05-16 MED ORDER — MYRBETRIQ 50 MG PO TB24: 50.0000 mg | ORAL_TABLET | Freq: Every day | ORAL | 5 refills | Status: DC

## 2023-05-16 MED ORDER — MIRABEGRON ER 50 MG PO TB24: 50.0000 mg | ORAL_TABLET | Freq: Every day | ORAL | 5 refills | Status: DC

## 2023-05-16 NOTE — Addendum Note (Signed)
Addended by: Damita Lack on: 05/16/2023 11:39 AM   Modules accepted: Orders

## 2023-05-16 NOTE — Assessment & Plan Note (Signed)
Chronic, intermittent.  Unclear current treatment she is using whether she is using the clobetasol or ketoconazole on this rash. I have recommended she use nystatin cream twice daily for at least 48 hours after rash improves.

## 2023-05-16 NOTE — Assessment & Plan Note (Signed)
Chronic, she had trial of 25 mg mirabegron in the past.  She does not remember having any issues with this but does not remember clear benefit.  We will increase the dose of mirabegron to 50 mg p.o. daily.  Encouraged her to try Kegel exercises and continue working on weight management for better control of incontinence.  Given a lot of her symptoms are from stress incontinence we discussed surgical options which she is not interested in.  I offered referral to urology for pelvic floor physical therapy and further assessment but she is not interested at this time.  Of note in the past she has not tolerated several anticholinergics.

## 2023-05-16 NOTE — Progress Notes (Signed)
Patient ID: Megan Bradshaw, female    DOB: [birth year], 89 y.o.   MRN: [id number]  This visit was conducted in person.  BP (!) 140/76 (BP Location: Left Arm, Patient Position: Sitting, Cuff Size: Large)   Pulse 78   Temp 97.8 F (36.6 C) (Temporal)   Ht 5' 2.5" (1.588 m)   Wt 181 lb 4 oz (82.2 kg)   SpO2 98%   BMI 32.62 kg/m    CC:  Chief Complaint  Patient presents with   Urinary Incontinence   Urinary Urgency    Subjective:   HPI: Megan Bradshaw is a 89 y.o. female presenting on 05/16/2023 for Urinary Incontinence and Urinary Urgency  Urinary Frequency  This is a new problem. The current episode started more than 1 month ago (3-4 months). The problem has been gradually worsening. The patient is experiencing no pain. There has been no fever. She is Not sexually active. There is No history of pyelonephritis. Associated symptoms include frequency and urgency. Associated symptoms comments: Urgency  Increasing incontinence.. She has tried increased fluids for the symptoms.   History of overactive bladder Myrbetriq 25 mg daily started in May 2023      FBS 93   Having itchy rash under breasts and in crease on skin folds on abdomen. Relevant past medical, surgical, family and social history reviewed and updated as indicated. Interim medical history since our last visit reviewed. Allergies and medications reviewed and updated. Outpatient Medications Prior to Visit  Medication Sig Dispense Refill   ACCU-CHEK GUIDE test strip USE TO TEST TWICE DAILY 200 strip 1   Accu-Chek Softclix Lancets lancets Use to check blood sugar 2 times a day 100 each 5   albuterol (PROVENTIL) (2.5 MG/3ML) 0.083% nebulizer solution Take 3 mLs (2.5 mg total) by nebulization every 4 (four) hours as needed for wheezing or shortness of breath. 150 mL 5   albuterol (VENTOLIN HFA) 108 (90 Base) MCG/ACT inhaler INHALE 1 TO 2 PUFFS BY MOUTH EVERY 6 HOURS FOR PAIN OR WHEEZING OR SHORTNESS OF BREATH 18 g 2   B  Complex-Folic Acid (BENFOTIAMINE MULTI-B) CAPS Take 1 capsule by mouth daily. 30 capsule 6   Blood Glucose Monitoring Suppl (ACCU-CHEK GUIDE ME) w/Device KIT See admin instructions.     Cholecalciferol (VITAMIN D PO) Take 1 tablet by mouth daily.     clobetasol cream (TEMOVATE) 0.05 % Apply 1 Application topically daily as needed. 30 g 1   fluticasone (FLONASE) 50 MCG/ACT nasal spray Place 2 sprays into both nostrils daily. 16 g 2   fluticasone furoate-vilanterol (BREO ELLIPTA) 100-25 MCG/ACT AEPB Inhale 1 puff into the lungs daily. 180 each 2   gabapentin (NEURONTIN) 100 MG capsule TAKE 1 CAPSULE(100 MG) BY MOUTH THREE TIMES DAILY 180 capsule 3   glipiZIDE (GLUCOTROL XL) 10 MG 24 hr tablet TAKE 1 TABLET(10 MG) BY MOUTH DAILY WITH BREAKFAST 90 tablet 1   ipratropium-albuterol (DUONEB) 0.5-2.5 (3) MG/3ML SOLN USE 3 ML VIA NEBULIZER EVERY 6 HOURS AS NEEDED 360 mL 1   ketoconazole (NIZORAL) 2 % cream Apply 1 Application topically daily. 15 g 0   levothyroxine (SYNTHROID) 75 MCG tablet TAKE 1 TABLET(75 MCG) BY MOUTH DAILY BEFORE BREAKFAST 90 tablet 3   metFORMIN (GLUCOPHAGE) 500 MG tablet TAKE 1 TABLET(500 MG) BY MOUTH TWICE DAILY WITH A MEAL 180 tablet 3   montelukast (SINGULAIR) 10 MG tablet TAKE 1 TABLET(10 MG) BY MOUTH AT BEDTIME 30 tablet 11   Multiple Vitamin (MULTIVITAMIN) capsule Take 1  capsule by mouth daily.     predniSONE (DELTASONE) 5 MG tablet TAKE 1 TABLET(5 MG) BY MOUTH DAILY WITH BREAKFAST 90 tablet 1   cephALEXin (KEFLEX) 500 MG capsule Take 1 capsule (500 mg total) by mouth 3 (three) times daily. 21 capsule 0   nystatin powder APPLY EXTERNALLY TO THE AFFECTED AREA TWICE DAILY 30 g 1   No facility-administered medications prior to visit.     Per HPI unless specifically indicated in ROS section below Review of Systems  Constitutional:  Negative for fatigue and fever.  HENT:  Negative for congestion.   Eyes:  Negative for pain.  Respiratory:  Negative for cough and shortness of  breath.   Cardiovascular:  Negative for chest pain, palpitations and leg swelling.  Gastrointestinal:  Negative for abdominal pain.  Genitourinary:  Positive for frequency and urgency. Negative for dysuria and vaginal bleeding.  Musculoskeletal:  Negative for back pain.  Skin:  Positive for rash.  Neurological:  Negative for syncope, light-headedness and headaches.  Psychiatric/Behavioral:  Negative for dysphoric mood.    Objective:  BP (!) 140/76 (BP Location: Left Arm, Patient Position: Sitting, Cuff Size: Large)   Pulse 78   Temp 97.8 F (36.6 C) (Temporal)   Ht 5' 2.5" (1.588 m)   Wt 181 lb 4 oz (82.2 kg)   SpO2 98%   BMI 32.62 kg/m   Wt Readings from Last 3 Encounters:  05/16/23 181 lb 4 oz (82.2 kg)  04/26/23 180 lb 2 oz (81.7 kg)  03/14/23 181 lb 8 oz (82.3 kg)      Physical Exam Constitutional:      General: She is not in acute distress.    Appearance: Normal appearance. She is well-developed. She is not ill-appearing or toxic-appearing.  HENT:     Head: Normocephalic.     Right Ear: Hearing, tympanic membrane, ear canal and external ear normal. Tympanic membrane is not erythematous, retracted or bulging.     Left Ear: Hearing, tympanic membrane, ear canal and external ear normal. Tympanic membrane is not erythematous, retracted or bulging.     Nose: No mucosal edema or rhinorrhea.     Right Sinus: No maxillary sinus tenderness or frontal sinus tenderness.     Left Sinus: No maxillary sinus tenderness or frontal sinus tenderness.     Mouth/Throat:     Pharynx: Uvula midline.  Eyes:     General: Lids are normal. Lids are everted, no foreign bodies appreciated.     Conjunctiva/sclera: Conjunctivae normal.     Pupils: Pupils are equal, round, and reactive to light.  Neck:     Thyroid: No thyroid mass or thyromegaly.     Vascular: No carotid bruit.     Trachea: Trachea normal.  Cardiovascular:     Rate and Rhythm: Normal rate and regular rhythm.     Pulses: Normal  pulses.     Heart sounds: Normal heart sounds, S1 normal and S2 normal. No murmur heard.    No friction rub. No gallop.  Pulmonary:     Effort: Pulmonary effort is normal. No tachypnea or respiratory distress.     Breath sounds: Normal breath sounds. No decreased breath sounds, wheezing, rhonchi or rales.  Abdominal:     General: Bowel sounds are normal.     Palpations: Abdomen is soft.     Tenderness: There is no abdominal tenderness. There is no right CVA tenderness or left CVA tenderness.  Musculoskeletal:     Cervical back: Normal range of  motion and neck supple.  Skin:    General: Skin is warm and dry.     Findings: No rash.  Neurological:     Mental Status: She is alert.  Psychiatric:        Mood and Affect: Mood is not anxious or depressed.        Speech: Speech normal.        Behavior: Behavior normal. Behavior is cooperative.        Thought Content: Thought content normal.        Judgment: Judgment normal.       Results for orders placed or performed in visit on 05/16/23  POCT Urinalysis Dipstick (Automated)  Result Value Ref Range   Color, UA Yellow    Clarity, UA Clear    Glucose, UA Negative Negative   Bilirubin, UA Negative    Ketones, UA Negative    Spec Grav, UA 1.015 1.010 - 1.025   Blood, UA Trace    pH, UA 5.5 5.0 - 8.0   Protein, UA Negative Negative   Urobilinogen, UA 0.2 0.2 or 1.0 E.U./dL   Nitrite, UA Postitive    Leukocytes, UA Moderate (2+) (A) Negative    Assessment and Plan  Urinary urgency Assessment & Plan: Chronic overactive bladder with some increase in worsening in the last 3 to 4 months.  Urinalysis today looks concerning for possible urinary tract infection.  Will send for culture.  Push fluids.  Orders: -     POCT Urinalysis Dipstick (Automated) -     Urine Culture  Candidal intertrigo Assessment & Plan: Chronic, intermittent.  Unclear current treatment she is using whether she is using the clobetasol or ketoconazole on  this rash. I have recommended she use nystatin cream twice daily for at least 48 hours after rash improves.   OAB (overactive bladder) Assessment & Plan: Chronic, she had trial of 25 mg mirabegron in the past.  She does not remember having any issues with this but does not remember clear benefit.  We will increase the dose of mirabegron to 50 mg p.o. daily.  Encouraged her to try Kegel exercises and continue working on weight management for better control of incontinence.  Given a lot of her symptoms are from stress incontinence we discussed surgical options which she is not interested in.  I offered referral to urology for pelvic floor physical therapy and further assessment but she is not interested at this time.  Of note in the past she has not tolerated several anticholinergics.    Other orders -     Nystatin; Apply 1 Application topically 2 (two) times daily.  Dispense: 30 g; Refill: 5 -     Mirabegron ER; Take 1 tablet (50 mg total) by mouth daily.  Dispense: 30 tablet; Refill: 5    No follow-ups on file.   Kerby Nora, MD

## 2023-05-16 NOTE — Assessment & Plan Note (Signed)
Chronic overactive bladder with some increase in worsening in the last 3 to 4 months.  Urinalysis today looks concerning for possible urinary tract infection.  Will send for culture.  Push fluids.

## 2023-05-16 NOTE — Patient Instructions (Addendum)
We will call with urine culture results. Go ahead and start mirabegron 50 mg daily to see if helpful with urinary urgency and frequency. Can do Kegel exercises for bladder support. Let me know if you decide you are interested in a referral to urology for additional recommendations on overactive bladder and stress incontinence.  For the rash in skin folds use nystatin cream twice daily for at least 48 hours after rash resolves.  Keep area as dry as possible.

## 2023-05-19 ENCOUNTER — Telehealth: Payer: Self-pay | Admitting: Family Medicine

## 2023-05-19 ENCOUNTER — Other Ambulatory Visit: Payer: Self-pay | Admitting: Family Medicine

## 2023-05-19 DIAGNOSIS — E099 Drug or chemical induced diabetes mellitus without complications: Secondary | ICD-10-CM

## 2023-05-19 LAB — URINE CULTURE
MICRO NUMBER:: 15153146
SPECIMEN QUALITY:: ADEQUATE

## 2023-05-19 MED ORDER — ACCU-CHEK SOFTCLIX LANCETS MISC
3 refills | Status: DC
Start: 2023-05-19 — End: 2024-05-13

## 2023-05-19 MED ORDER — NITROFURANTOIN MONOHYD MACRO 100 MG PO CAPS: 100.0000 mg | ORAL_CAPSULE | Freq: Two times a day (BID) | ORAL | 0 refills | Status: DC

## 2023-05-19 NOTE — Addendum Note (Signed)
Addended by: Damita Lack on: 05/19/2023 10:16 AM   Modules accepted: Orders

## 2023-05-19 NOTE — Telephone Encounter (Signed)
Spoke with Ms. Demont.  She states she filled the Myrbetriq and it cost her $64 for a 30 day supply.  She states she can't continue to pay for that.  Please advise.  Also while on the phone patient ask for a refill on her lancets.  Refill sent as requested.

## 2023-05-19 NOTE — Telephone Encounter (Signed)
Per past office visit notes from [facility], NP, she had trouble tolerating cholinergics such as Ditropan.    Next option would be referral to urologist.

## 2023-05-19 NOTE — Telephone Encounter (Signed)
Pt called requesting a call back from Lupita Leash to discuss some bladder meds (pt couldn't remember the name). Pt stated the meds are expensive & wanted to discuss cheaper options. Call back # [phone]

## 2023-05-22 NOTE — Telephone Encounter (Signed)
Megan Bradshaw notified as instructed by telephone.  She states she can't afford the Myrbetriq and does not want a referral to urology at this time.  She states she will complete full course of antibiotics and the 30 days of Myrbetriq that she has and go from there.  FYI to Dr. Ermalene Searing.

## 2023-06-01 ENCOUNTER — Telehealth: Payer: Self-pay | Admitting: Podiatry

## 2023-06-01 ENCOUNTER — Ambulatory Visit: Payer: Medicare Other | Admitting: Family Medicine

## 2023-06-01 ENCOUNTER — Encounter: Payer: Self-pay | Admitting: Family Medicine

## 2023-06-01 VITALS — BP 138/80 | HR 71 | Temp 97.8°F | Ht 62.5 in | Wt 180.5 lb

## 2023-06-01 DIAGNOSIS — M542 Cervicalgia: Secondary | ICD-10-CM | POA: Diagnosis not present

## 2023-06-01 NOTE — Telephone Encounter (Signed)
Tried to reach pt to schedule picking up her diabetic shoes , no answer no vm

## 2023-06-01 NOTE — Assessment & Plan Note (Signed)
Left sided neck spasm and soreness since fall 1.5 months ago  Gets stiff after inactivity Some rad of pain down left arm but no neuro changes  Suspect spasm  Not quite torticollis as rom is good  Wants to get back to water aerobics   Reassuring exam  Reviewed last CS xray 2016 with degenerative change (could certainly have some ddd)   Ref to PT to help with modalities for pain/spasm and stretches  Encouraged her to try different sleep position (she likes her current pillow) Encouraged trial of voltaren gel prn  Encouraged use of heat gently for 10 min at a time Call back and Er precautions noted in detail today   Follow up with pcp if no improvement or worse/ would consider imaging

## 2023-06-01 NOTE — Progress Notes (Signed)
Subjective:    Patient ID: Megan Bradshaw, female    DOB: 1930/05/20, 89 y.o.   MRN: [id number]  HPI  Wt Readings from Last 3 Encounters:  06/01/23 180 lb 8 oz (81.9 kg)  05/16/23 181 lb 4 oz (82.2 kg)  04/26/23 180 lb 2 oz (81.7 kg)   32.49 kg/m  Vitals:   06/01/23 1127 06/01/23 1209  BP: (!) 156/94 138/80  Pulse: 71   Temp: 97.8 F (36.6 C)   SpO2: 100%     89 yo pt of Dr Ermalene Searing   Pt presents for c/o neck pain  Had a fall 1.5 months ago (fell in garage/no head injury)  ? Pulled muscle   Hurts on the left side  Radiates down her harm  Into her shoulder blade also  Both sharp and dull pain   Sleeps on that side  Hurts to turn her head   Uses 2 pillows (the my pillow brand)  Wears 02 at night also    Used a cream - for pain over the counter  Not an nsaid  Cannot take tylenol (it gives her nightmares)  Takes 5 mg prednisone daily    Usually goes to water aerobics but unable to use this week     Last neck film was 2016  Associated Dx: Neck pain    Study Result CLINICAL DATA: Fall.  EXAM: CERVICAL SPINE - COMPLETE 4+ VIEW  COMPARISON: Chest x-ray 07/29/2015 .  FINDINGS: Diffuse multilevel degenerative change. Diffuse osteopenia. Right carotid atherosclerotic vascular calcification. Biapical pleural thickening noted consistent with scarring.  IMPRESSION: 1. Diffuse multilevel degenerative change. No acute abnormality.  2. Diffuse osteopenia.  3. Right carotid atherosclerotic vascular disease.      Patient Active Problem List   Diagnosis Date Noted   Neck pain on left side 06/01/2023   Urinary urgency 05/16/2023   Candidal intertrigo 05/16/2023   Nasal congestion 03/14/2023   Cheilitis 01/19/2023   Pre-ulcerative calluses 01/19/2023   Vertigo 12/13/2022   Acute diarrhea 08/04/2022   DOE (dyspnea on exertion) 03/03/2022   Left lower quadrant abdominal pain 03/03/2022   Neuropathy due to type 2 diabetes mellitus (HCC) 03/26/2021    Arthritis 12/07/2017   COPD exacerbation (HCC) 05/18/2017   OAB (overactive bladder) 09/01/2016   Seasonal allergies 09/01/2016   Acute non-recurrent sinusitis 05/21/2015   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   APPENDECTOMY  1939    CATARACT EXTRACTION, BILATERAL     CHOLECYSTECTOMY  1987   NASAL SINUS SURGERY  1990   REPLACEMENT TOTAL KNEE Right 2007   Social History   Tobacco Use   Smoking status: Former    Current packs/day: 0.00    Average packs/day: 1.5 packs/day for 30.0 years (45.0 ttl pk-yrs)    Types: Cigarettes    Start date: 11/14/1957    Quit date: 11/15/1987    Years since quitting: 35.5   Smokeless tobacco: Never  Vaping Use   Vaping status: Never Used  Substance Use Topics   Alcohol use: No    Alcohol/week: 0.0 standard drinks of  alcohol   Drug use: No   Family History  Problem Relation Age of Onset   Arthritis Father    Diabetes Sister    Diabetes Daughter    Cancer Neg Hx    Heart disease Neg Hx    Stroke Neg Hx    Allergies  Allergen Reactions   Myrbetriq Theodosia Paling Er] Other (See Comments)    GI Problem   Augmentin [Amoxicillin-Pot Clavulanate] Diarrhea   Current Outpatient Medications on File Prior to Visit  Medication Sig Dispense Refill   ACCU-CHEK GUIDE test strip USE TO TEST TWICE DAILY 200 strip 1   Accu-Chek Softclix Lancets lancets Use to check blood sugar 1 times a day 100 each 3   albuterol (PROVENTIL) (2.5 MG/3ML) 0.083% nebulizer solution Take 3 mLs (2.5 mg total) by nebulization every 4 (four) hours as needed for wheezing or shortness of  breath. 150 mL 5   albuterol (VENTOLIN HFA) 108 (90 Base) MCG/ACT inhaler INHALE 1 TO 2 PUFFS BY MOUTH EVERY 6 HOURS FOR PAIN OR WHEEZING OR SHORTNESS OF BREATH 18 g 2   B Complex-Folic Acid (BENFOTIAMINE MULTI-B) CAPS Take 1 capsule by mouth daily. 30 capsule 6   Blood Glucose Monitoring Suppl (ACCU-CHEK GUIDE ME) w/Device KIT See admin instructions.     Cholecalciferol (VITAMIN D PO) Take 1 tablet by mouth daily.     clobetasol cream (TEMOVATE) 0.05 % Apply 1 Application topically daily as needed. 30 g 1   fluticasone (FLONASE) 50 MCG/ACT nasal spray Place 2 sprays into both nostrils daily. 16 g 2   fluticasone furoate-vilanterol (BREO ELLIPTA) 100-25 MCG/ACT AEPB Inhale 1 puff into the lungs daily. 180 each 2   gabapentin (NEURONTIN) 100 MG capsule TAKE 1 CAPSULE(100 MG) BY MOUTH THREE TIMES DAILY 180 capsule 3   glipiZIDE (GLUCOTROL XL) 10 MG 24 hr tablet TAKE 1 TABLET(10 MG) BY MOUTH DAILY WITH BREAKFAST 90 tablet 1   ipratropium-albuterol (DUONEB) 0.5-2.5 (3) MG/3ML SOLN USE 3 ML VIA NEBULIZER EVERY 6 HOURS AS NEEDED 360 mL 1   ketoconazole (NIZORAL) 2 % cream Apply 1 Application topically daily. 15 g 0   levothyroxine (SYNTHROID) 75 MCG tablet TAKE 1 TABLET(75 MCG) BY MOUTH DAILY BEFORE BREAKFAST 90 tablet 3   metFORMIN (GLUCOPHAGE) 500 MG tablet TAKE 1 TABLET(500 MG) BY MOUTH TWICE DAILY WITH A MEAL 180 tablet 3   montelukast (SINGULAIR) 10 MG tablet TAKE 1 TABLET(10 MG) BY MOUTH AT BEDTIME 30 tablet 11   Multiple Vitamin (MULTIVITAMIN) capsule Take 1 capsule by mouth daily.     nystatin cream (MYCOSTATIN) Apply 1 Application topically 2 (two) times daily. 30 g 5   predniSONE (DELTASONE) 5 MG tablet TAKE 1 TABLET(5 MG) BY MOUTH DAILY WITH BREAKFAST 90 tablet 1   No current facility-administered medications on file prior to visit.    Review of Systems  Constitutional:  Positive for fatigue.  HENT:  Negative for facial swelling, sore throat and voice change.   Cardiovascular:  Negative  for chest pain.  Genitourinary:        Ongoing bladder problems and incontinence  Musculoskeletal:  Positive for arthralgias, neck pain and neck stiffness.  Neurological:  Negative for dizziness, facial asymmetry, weakness, numbness and headaches.       Objective:   Physical Exam Neck:     Comments: Some lower CS tenderness More tender in left cervical musculature and trapezius  Normal rom  Most pain with full extension and left rotation and tilt    Musculoskeletal:  General: No deformity or signs of injury.     Cervical back: No edema, erythema, signs of trauma, rigidity, torticollis or crepitus. Pain with movement, spinous process tenderness and muscular tenderness present. Normal range of motion.  Lymphadenopathy:     Cervical: No cervical adenopathy.     Right cervical: No superficial, deep or posterior cervical adenopathy.    Left cervical: No superficial, deep or posterior cervical adenopathy.  Neurological:     Cranial Nerves: No cranial nerve deficit.     Sensory: No sensory deficit.     Motor: No weakness.     Deep Tendon Reflexes: Reflexes normal.     Comments: Normal hand and wrist strength bilaterally   No sensory changes in UEs           Assessment & Plan:   Problem List Items Addressed This Visit       Other   Neck pain on left side - Primary    Left sided neck spasm and soreness since fall 1.5 months ago  Gets stiff after inactivity Some rad of pain down left arm but no neuro changes  Suspect spasm  Not quite torticollis as rom is good  Wants to get back to water aerobics   Reassuring exam  Reviewed last CS xray 2016 with degenerative change (could certainly have some ddd)   Ref to PT to help with modalities for pain/spasm and stretches  Encouraged her to try different sleep position (she likes her current pillow) Encouraged trial of voltaren gel prn  Encouraged use of heat gently for 10 min at a time Call back and Er precautions noted  in detail today   Follow up with pcp if no improvement or worse/ would consider imaging      Relevant Orders   Ambulatory referral to Physical Therapy

## 2023-06-01 NOTE — Patient Instructions (Addendum)
I think you have muscle strain and spasm in the left neck   I put the referral in for physical therapy  Please let us know if you don't hear in 1-2 weeks   Try a heating pad for 10 minutes at a time to help pain and relax muscles   Also get some voltaren (diclofenac) gel 1% over the counter  You can apply a small amount (dime sized) to affected area up to four times daily   It is ok to do water walking

## 2023-06-06 NOTE — Telephone Encounter (Signed)
No answer no vm tried to reach pt for diabetic shoes 2nd attempt

## 2023-06-07 ENCOUNTER — Ambulatory Visit (INDEPENDENT_AMBULATORY_CARE_PROVIDER_SITE_OTHER): Payer: Medicare Other | Admitting: Family Medicine

## 2023-06-07 ENCOUNTER — Ambulatory Visit: Admission: RE | Admit: 2023-06-07 | Payer: Medicare Other | Source: Ambulatory Visit

## 2023-06-07 ENCOUNTER — Encounter: Payer: Self-pay | Admitting: Family Medicine

## 2023-06-07 VITALS — BP 122/64 | HR 87 | Temp 98.0°F | Ht 62.5 in | Wt 181.0 lb

## 2023-06-07 DIAGNOSIS — M47812 Spondylosis without myelopathy or radiculopathy, cervical region: Secondary | ICD-10-CM | POA: Diagnosis not present

## 2023-06-07 DIAGNOSIS — N3281 Overactive bladder: Secondary | ICD-10-CM

## 2023-06-07 DIAGNOSIS — M542 Cervicalgia: Secondary | ICD-10-CM

## 2023-06-07 DIAGNOSIS — J441 Chronic obstructive pulmonary disease with (acute) exacerbation: Secondary | ICD-10-CM | POA: Diagnosis not present

## 2023-06-07 DIAGNOSIS — R0609 Other forms of dyspnea: Secondary | ICD-10-CM

## 2023-06-07 DIAGNOSIS — M50321 Other cervical disc degeneration at C4-C5 level: Secondary | ICD-10-CM | POA: Diagnosis not present

## 2023-06-07 DIAGNOSIS — M79602 Pain in left arm: Secondary | ICD-10-CM | POA: Diagnosis not present

## 2023-06-07 MED ORDER — PREDNISONE 20 MG PO TABS
ORAL_TABLET | ORAL | 0 refills | Status: DC
Start: 2023-06-07 — End: 2023-07-11

## 2023-06-07 MED ORDER — HYDROCODONE-ACETAMINOPHEN 5-325 MG PO TABS: 1.0000 | ORAL_TABLET | Freq: Four times a day (QID) | ORAL | 0 refills | Status: DC | PRN

## 2023-06-07 NOTE — Patient Instructions (Signed)
We will call with x-ray result. Continue heat on neck.  Start prednisone taper.  Use albuterol 2 puffs inhaled every 4-6 hours as needed for shortness of breath. Use hydrocodone pain medication every 6 hours as needed for pain but limit is much as able.  You can start with a half a tablet.

## 2023-06-07 NOTE — Assessment & Plan Note (Signed)
Acute, concerning for possible cervical radiculopathy. Patient in significant pain.  Given age and focal vertebral tenderness I will move forward with an x-ray of her cervical spine. Will treat with prednisone taper (she will hold her usual prednisone 5 mg daily). She is on gabapentin but cannot tolerate a higher dose without oversedation. Given her level of discomfort we will try a trial of half to 1 tablet of hydrocodone/APAP 5/325 mg every 6 hours as needed for pain. I held off on a muscle relaxant to avoid oversedation given her age.

## 2023-06-07 NOTE — Assessment & Plan Note (Signed)
Intolerant of most recent anticholinergic.  Recommended referral to urology but she will hold off until feeling better with her neck.

## 2023-06-07 NOTE — Progress Notes (Signed)
Patient ID: Megan Bradshaw, female    DOB: 1930/08/16, 89 y.o.   MRN: [id number]  This visit was conducted in person.  BP 122/64 (BP Location: Left Arm, Patient Position: Sitting, Cuff Size: Normal)   Pulse 87   Temp 98 F (36.7 C) (Temporal)   Ht 5' 2.5" (1.588 m)   Wt 181 lb (82.1 kg)   SpO2 91%   BMI 32.58 kg/m    CC:  Chief Complaint  Patient presents with   Neck Pain    Patient is having a lot of neck pain that radiates down arm and across chest   Sinus Problem    Patient has been having sinus drainage and head congestion x 1 week. Patient states she has COPD and has had some SOB with exertion     Subjective:   HPI: Megan Bradshaw is a 89 y.o. female presenting on 06/07/2023 for Neck Pain (Patient is having a lot of neck pain that radiates down arm and across chest) and Sinus Problem (Patient has been having sinus drainage and head congestion x 1 week. Patient states she has COPD and has had some SOB with exertion )  Acute neck pain..  Reviewed recent office visit note with Dr. Milinda Antis from July 18 for similar issue.  Patient had reported left-sided neck spasm and soreness since she fell in May.  Exam was reassuring.       Referred to physical therapy, encouraged trial of Voltaren gel, heat and different sleep position.  Today she reports  she has not been seen by PT, minimal improvement with voltaren gel  No weakness. Pain is severe keeping her up night.  Patient tearful in office reporting pain  Pain in left upper back , radiates to left arm.  Occ numbness in arm.  Ibuprofen not helpful. History of degenerative disc disease in the neck on x-ray in 2016  2.  Sinus drainage and nasal congestion x 1 week.  Central face pain.  No fever. She feels  increase in SOB.  No sick contacts. Hx of COPD, followed by pulmonary.    Cannot tolerate  new OAB medication.  Relevant past medical, surgical, family and social history reviewed and updated as indicated. Interim medical  history since our last visit reviewed. Allergies and medications reviewed and updated. Outpatient Medications Prior to Visit  Medication Sig Dispense Refill   ACCU-CHEK GUIDE test strip USE TO TEST TWICE DAILY 200 strip 1   Accu-Chek Softclix Lancets lancets Use to check blood sugar 1 times a day 100 each 3   albuterol (PROVENTIL) (2.5 MG/3ML) 0.083% nebulizer solution Take 3 mLs (2.5 mg total) by nebulization every 4 (four) hours as needed for wheezing or shortness of breath. 150 mL 5   albuterol (VENTOLIN HFA) 108 (90 Base) MCG/ACT inhaler INHALE 1 TO 2 PUFFS BY MOUTH EVERY 6 HOURS FOR PAIN OR WHEEZING OR SHORTNESS OF BREATH 18 g 2   B Complex-Folic Acid (BENFOTIAMINE MULTI-B) CAPS Take 1 capsule by mouth daily. 30 capsule 6   Blood Glucose Monitoring Suppl (ACCU-CHEK GUIDE ME) w/Device KIT See admin instructions.     Cholecalciferol (VITAMIN D PO) Take 1 tablet by mouth daily.     clobetasol cream (TEMOVATE) 0.05 % Apply 1 Application topically daily as needed. 30 g 1   fluticasone (FLONASE) 50 MCG/ACT nasal spray Place 2 sprays into both nostrils daily. 16 g 2   fluticasone furoate-vilanterol (BREO ELLIPTA) 100-25 MCG/ACT AEPB Inhale 1 puff into the lungs daily. 180  each 2   gabapentin (NEURONTIN) 100 MG capsule TAKE 1 CAPSULE(100 MG) BY MOUTH THREE TIMES DAILY 180 capsule 3   glipiZIDE (GLUCOTROL XL) 10 MG 24 hr tablet TAKE 1 TABLET(10 MG) BY MOUTH DAILY WITH BREAKFAST 90 tablet 1   ipratropium-albuterol (DUONEB) 0.5-2.5 (3) MG/3ML SOLN USE 3 ML VIA NEBULIZER EVERY 6 HOURS AS NEEDED 360 mL 1   ketoconazole (NIZORAL) 2 % cream Apply 1 Application topically daily. 15 g 0   levothyroxine (SYNTHROID) 75 MCG tablet TAKE 1 TABLET(75 MCG) BY MOUTH DAILY BEFORE BREAKFAST 90 tablet 3   metFORMIN (GLUCOPHAGE) 500 MG tablet TAKE 1 TABLET(500 MG) BY MOUTH TWICE DAILY WITH A MEAL 180 tablet 3   montelukast (SINGULAIR) 10 MG tablet TAKE 1 TABLET(10 MG) BY MOUTH AT BEDTIME 30 tablet 11   Multiple Vitamin  (MULTIVITAMIN) capsule Take 1 capsule by mouth daily.     nystatin cream (MYCOSTATIN) Apply 1 Application topically 2 (two) times daily. 30 g 5   predniSONE (DELTASONE) 5 MG tablet TAKE 1 TABLET(5 MG) BY MOUTH DAILY WITH BREAKFAST 90 tablet 1   No facility-administered medications prior to visit.     Per HPI unless specifically indicated in ROS section below Review of Systems  HENT:  Positive for congestion.   Respiratory:  Positive for shortness of breath and wheezing. Negative for cough.   Musculoskeletal:  Positive for back pain, neck pain and neck stiffness.   Objective:  BP 122/64 (BP Location: Left Arm, Patient Position: Sitting, Cuff Size: Normal)   Pulse 87   Temp 98 F (36.7 C) (Temporal)   Ht 5' 2.5" (1.588 m)   Wt 181 lb (82.1 kg)   SpO2 91%   BMI 32.58 kg/m   Wt Readings from Last 3 Encounters:  06/07/23 181 lb (82.1 kg)  06/01/23 180 lb 8 oz (81.9 kg)  05/16/23 181 lb 4 oz (82.2 kg)      Physical Exam Constitutional:      General: She is not in acute distress.    Appearance: Normal appearance. She is well-developed. She is not ill-appearing or toxic-appearing.  HENT:     Head: Normocephalic.     Right Ear: Hearing, tympanic membrane, ear canal and external ear normal. Tympanic membrane is not erythematous, retracted or bulging.     Left Ear: Hearing, tympanic membrane, ear canal and external ear normal. Tympanic membrane is not erythematous, retracted or bulging.     Nose: No mucosal edema or rhinorrhea.     Right Sinus: No maxillary sinus tenderness or frontal sinus tenderness.     Left Sinus: No maxillary sinus tenderness or frontal sinus tenderness.     Mouth/Throat:     Mouth: Oropharynx is clear and moist and mucous membranes are normal.     Pharynx: Uvula midline.  Eyes:     General: Lids are normal. Lids are everted, no foreign bodies appreciated.     Extraocular Movements: EOM normal.     Conjunctiva/sclera: Conjunctivae normal.     Pupils: Pupils  are equal, round, and reactive to light.  Neck:     Thyroid: No thyroid mass or thyromegaly.     Vascular: No carotid bruit.     Trachea: Trachea normal.  Cardiovascular:     Rate and Rhythm: Normal rate and regular rhythm.     Pulses: Normal pulses.     Heart sounds: Normal heart sounds, S1 normal and S2 normal. No murmur heard.    No friction rub. No gallop.  Pulmonary:  Effort: Pulmonary effort is normal. No tachypnea or respiratory distress.     Breath sounds: Normal breath sounds. No decreased breath sounds, wheezing, rhonchi or rales.  Abdominal:     General: Bowel sounds are normal.     Palpations: Abdomen is soft.     Tenderness: There is no abdominal tenderness.  Musculoskeletal:     Cervical back: Neck supple. Tenderness and bony tenderness present. Pain with movement, spinous process tenderness and muscular tenderness present. Decreased range of motion.     Comments: Ttp over C5, C6, C7 vertebrae, diffuse tenderness to palpation over left upper back and left arm above elbow Positive Spurling test on the left  Lymphadenopathy:     Cervical: No cervical adenopathy.  Skin:    General: Skin is warm, dry and intact.     Findings: No rash.  Neurological:     Mental Status: She is alert.  Psychiatric:        Mood and Affect: Mood is not anxious or depressed.        Speech: Speech normal.        Behavior: Behavior normal. Behavior is cooperative.        Thought Content: Thought content normal.        Cognition and Memory: Cognition and memory normal.        Judgment: Judgment normal.       Results for orders placed or performed in visit on 05/16/23  Urine Culture   Specimen: Urine  Result Value Ref Range   MICRO NUMBER: [id number]    SPECIMEN QUALITY: Adequate    Sample Source URINE, CLEAN CATCH    STATUS: FINAL    ISOLATE 1: Escherichia coli (A)       Susceptibility   Escherichia coli - URINE CULTURE, REFLEX    AMOX/CLAVULANIC >=32 Resistant     AMPICILLIN >=32  Resistant     AMPICILLIN/SULBACTAM >=32 Resistant     CEFAZOLIN* 8 Resistant      * For uncomplicated UTI caused by E. coli, K. pneumoniae or P. mirabilis: Cefazolin is susceptible if MIC <32 mcg/mL and predicts susceptible to the oral agents cefaclor, cefdinir, cefpodoxime, cefprozil, cefuroxime, cephalexin and loracarbef.     CEFTAZIDIME <=1 Sensitive     CEFEPIME <=1 Sensitive     CEFTRIAXONE <=1 Sensitive     CIPROFLOXACIN <=0.25 Sensitive     LEVOFLOXACIN <=0.12 Sensitive     GENTAMICIN <=1 Sensitive     IMIPENEM <=0.25 Sensitive     NITROFURANTOIN <=16 Sensitive     PIP/TAZO 8 Sensitive     TOBRAMYCIN <=1 Sensitive     TRIMETH/SULFA* <=20 Sensitive      * For uncomplicated UTI caused by E. coli, K. pneumoniae or P. mirabilis: Cefazolin is susceptible if MIC <32 mcg/mL and predicts susceptible to the oral agents cefaclor, cefdinir, cefpodoxime, cefprozil, cefuroxime, cephalexin and loracarbef. Legend: S = Susceptible  I = Intermediate R = Resistant  NS = Not susceptible * = Not tested  NR = Not reported **NN = See antimicrobic comments   POCT Urinalysis Dipstick (Automated)  Result Value Ref Range   Color, UA Yellow    Clarity, UA Clear    Glucose, UA Negative Negative   Bilirubin, UA Negative    Ketones, UA Negative    Spec Grav, UA 1.015 1.010 - 1.025   Blood, UA Trace    pH, UA 5.5 5.0 - 8.0   Protein, UA Negative Negative   Urobilinogen, UA 0.2 0.2 or 1.0  E.U./dL   Nitrite, UA Postitive    Leukocytes, UA Moderate (2+) (A) Negative    Assessment and Plan  Neck pain, acute Assessment & Plan: Acute, concerning for possible cervical radiculopathy. Patient in significant pain.  Given age and focal vertebral tenderness I will move forward with an x-ray of her cervical spine. Will treat with prednisone taper (she will hold her usual prednisone 5 mg daily). She is on gabapentin but cannot tolerate a higher dose without oversedation. Given her level of  discomfort we will try a trial of half to 1 tablet of hydrocodone/APAP 5/325 mg every 6 hours as needed for pain. I held off on a muscle relaxant to avoid oversedation given her age.  Orders: -     DG Cervical Spine Complete; Future  COPD exacerbation (HCC)  DOE (dyspnea on exertion) Assessment & Plan: Acute, in part likely due to the neck pain but also with current viral/allergic symptoms possibly suggesting COPD exacerbation.  Lung exam shows no wheeze but some decreased air movement.  Will start prednisone taper and supportive care. Return and ER precautions provided.   OAB (overactive bladder) Assessment & Plan: Intolerant of most recent anticholinergic.  Recommended referral to urology but she will hold off until feeling better with her neck.   Other orders -     predniSONE; 3 tabs by mouth daily x 3 days, then 2 tabs by mouth daily x 2 days then 1 tab by mouth daily x 2 days  Dispense: 15 tablet; Refill: 0 -     HYDROcodone-Acetaminophen; Take 1 tablet by mouth every 6 (six) hours as needed for moderate pain or severe pain.  Dispense: 20 tablet; Refill: 0    Return in about 1 week (around 06/14/2023).   Kerby Nora, MD

## 2023-06-07 NOTE — Assessment & Plan Note (Signed)
Acute, in part likely due to the neck pain but also with current viral/allergic symptoms possibly suggesting COPD exacerbation.  Lung exam shows no wheeze but some decreased air movement.  Will start prednisone taper and supportive care. Return and ER precautions provided.

## 2023-06-23 ENCOUNTER — Ambulatory Visit: Payer: Medicare Other

## 2023-06-23 NOTE — Progress Notes (Unsigned)
Patient presents today to pick up diabetic shoes and insoles.  Patient was dispensed 1 pair of diabetic shoes and 3 pairs of foam casted diabetic insoles. Fit was satisfactory. Instructions for break-in and wear was reviewed and a copy was given to the patient.   Re-appointment for regularly scheduled diabetic foot care visits or if they should experience any trouble with the shoes or insoles.  Patient is very happy with fit and function  Megan Bradshaw CPed, CFO, CFM

## 2023-06-28 ENCOUNTER — Telehealth: Payer: Self-pay | Admitting: Family Medicine

## 2023-06-28 NOTE — Telephone Encounter (Signed)
Patient dropped off document  Nebulizer rx , to be filled out by provider. Patient requested to send it back via Fax within 5-days. Document is located in providers tray at front office.Please send back to fax number [phone]

## 2023-06-28 NOTE — Telephone Encounter (Signed)
Nebulizer order placed in Dr. Daphine Deutscher office to sign.

## 2023-06-29 NOTE — Telephone Encounter (Signed)
Completed and in outbox. 

## 2023-06-29 NOTE — Telephone Encounter (Addendum)
Completed form faxed to [phone].

## 2023-07-03 ENCOUNTER — Encounter: Payer: Self-pay | Admitting: Pulmonary Disease

## 2023-07-11 ENCOUNTER — Ambulatory Visit (INDEPENDENT_AMBULATORY_CARE_PROVIDER_SITE_OTHER): Payer: Medicare Other | Admitting: Family

## 2023-07-11 ENCOUNTER — Encounter: Payer: Self-pay | Admitting: Family

## 2023-07-11 ENCOUNTER — Ambulatory Visit
Admission: RE | Admit: 2023-07-11 | Discharge: 2023-07-11 | Disposition: A | Discharge status: Home / Self Care | Payer: Medicare Other | Source: Ambulatory Visit | Attending: Family | Admitting: Family

## 2023-07-11 VITALS — BP 124/94 | HR 79 | Temp 97.5°F

## 2023-07-11 DIAGNOSIS — L03116 Cellulitis of left lower limb: Secondary | ICD-10-CM | POA: Diagnosis not present

## 2023-07-11 DIAGNOSIS — R600 Localized edema: Secondary | ICD-10-CM | POA: Diagnosis not present

## 2023-07-11 DIAGNOSIS — S99922A Unspecified injury of left foot, initial encounter: Secondary | ICD-10-CM | POA: Insufficient documentation

## 2023-07-11 DIAGNOSIS — M7732 Calcaneal spur, left foot: Secondary | ICD-10-CM | POA: Diagnosis not present

## 2023-07-11 DIAGNOSIS — Z23 Encounter for immunization: Secondary | ICD-10-CM

## 2023-07-11 DIAGNOSIS — M2012 Hallux valgus (acquired), left foot: Secondary | ICD-10-CM | POA: Diagnosis not present

## 2023-07-11 MED ORDER — CEPHALEXIN 500 MG PO CAPS
500.0000 mg | ORAL_CAPSULE | Freq: Four times a day (QID) | ORAL | 0 refills | Status: AC
Start: 2023-07-11 — End: 2023-07-18

## 2023-07-11 NOTE — Assessment & Plan Note (Signed)
Xray left foot ordered to r/o fracture and or osteomyelitis Pending results Wear supportive footwear  Ice and elevation recommended

## 2023-07-11 NOTE — Progress Notes (Signed)
Established Patient Office Visit  Subjective:   Patient ID: Megan Bradshaw, female    DOB: [birth year]  Age: 89 y.o. MRN: [id number]  CC:  Chief Complaint  Patient presents with   Acute Visit    Dropped something on her L big toe. There is bruising, swelling and painful to walk.    HPI: Megan Bradshaw is a 89 y.o. female presenting on 07/11/2023 for Acute Visit (Dropped something on her L big toe. There is bruising, swelling and painful to walk.)  Two days ago dropped an ottoman on her foot. She states with bruising but now with increased bruising and a red area under her right base of nail bed.not itching. No discharge seen from her. Putting ice packs on it when she can, painful with walking.         ROS: Negative unless specifically indicated above in HPI.   Relevant past medical history reviewed and updated as indicated.   Allergies and medications reviewed and updated.   Current Outpatient Medications:    ACCU-CHEK GUIDE test strip, USE TO TEST TWICE DAILY, Disp: 200 strip, Rfl: 1   Accu-Chek Softclix Lancets lancets, Use to check blood sugar 1 times a day, Disp: 100 each, Rfl: 3   albuterol (PROVENTIL) (2.5 MG/3ML) 0.083% nebulizer solution, Take 3 mLs (2.5 mg total) by nebulization every 4 (four) hours as needed for wheezing or shortness of breath., Disp: 150 mL, Rfl: 5   albuterol (VENTOLIN HFA) 108 (90 Base) MCG/ACT inhaler, INHALE 1 TO 2 PUFFS BY MOUTH EVERY 6 HOURS FOR PAIN OR WHEEZING OR SHORTNESS OF BREATH, Disp: 18 g, Rfl: 2   B Complex-Folic Acid (BENFOTIAMINE MULTI-B) CAPS, Take 1 capsule by mouth daily., Disp: 30 capsule, Rfl: 6   Blood Glucose Monitoring Suppl (ACCU-CHEK GUIDE ME) w/Device KIT, See admin instructions., Disp: , Rfl:    cephALEXin (KEFLEX) 500 MG capsule, Take 1 capsule (500 mg total) by mouth 4 (four) times daily for 7 days., Disp: 28 capsule, Rfl: 0   Cholecalciferol (VITAMIN D PO), Take 1 tablet by mouth daily., Disp: , Rfl:    clobetasol  cream (TEMOVATE) 0.05 %, Apply 1 Application topically daily as needed., Disp: 30 g, Rfl: 1   fluticasone (FLONASE) 50 MCG/ACT nasal spray, Place 2 sprays into both nostrils daily., Disp: 16 g, Rfl: 2   fluticasone furoate-vilanterol (BREO ELLIPTA) 100-25 MCG/ACT AEPB, Inhale 1 puff into the lungs daily., Disp: 180 each, Rfl: 2   gabapentin (NEURONTIN) 100 MG capsule, TAKE 1 CAPSULE(100 MG) BY MOUTH THREE TIMES DAILY, Disp: 180 capsule, Rfl: 3   glipiZIDE (GLUCOTROL XL) 10 MG 24 hr tablet, TAKE 1 TABLET(10 MG) BY MOUTH DAILY WITH BREAKFAST, Disp: 90 tablet, Rfl: 1   HYDROcodone-acetaminophen (NORCO/VICODIN) 5-325 MG tablet, Take 1 tablet by mouth every 6 (six) hours as needed for moderate pain or severe pain., Disp: 20 tablet, Rfl: 0   ipratropium-albuterol (DUONEB) 0.5-2.5 (3) MG/3ML SOLN, USE 3 ML VIA NEBULIZER EVERY 6 HOURS AS NEEDED, Disp: 360 mL, Rfl: 1   ketoconazole (NIZORAL) 2 % cream, Apply 1 Application topically daily., Disp: 15 g, Rfl: 0   levothyroxine (SYNTHROID) 75 MCG tablet, TAKE 1 TABLET(75 MCG) BY MOUTH DAILY BEFORE BREAKFAST, Disp: 90 tablet, Rfl: 3   metFORMIN (GLUCOPHAGE) 500 MG tablet, TAKE 1 TABLET(500 MG) BY MOUTH TWICE DAILY WITH A MEAL, Disp: 180 tablet, Rfl: 3   montelukast (SINGULAIR) 10 MG tablet, TAKE 1 TABLET(10 MG) BY MOUTH AT BEDTIME, Disp: 30 tablet, Rfl: 11  Multiple Vitamin (MULTIVITAMIN) capsule, Take 1 capsule by mouth daily., Disp: , Rfl:    nystatin cream (MYCOSTATIN), Apply 1 Application topically 2 (two) times daily., Disp: 30 g, Rfl: 5   predniSONE (DELTASONE) 5 MG tablet, TAKE 1 TABLET(5 MG) BY MOUTH DAILY WITH BREAKFAST, Disp: 90 tablet, Rfl: 1  Allergies  Allergen Reactions   Myrbetriq Theodosia Paling Er] Other (See Comments)    GI Problem   Augmentin [Amoxicillin-Pot Clavulanate] Diarrhea    Objective:   BP (!) 124/94 (BP Location: Right Arm, Patient Position: Sitting, Cuff Size: Normal)   Pulse 79   Temp (!) 97.5 F (36.4 C) (Oral)   SpO2 98%     Physical Exam Constitutional:      General: She is not in acute distress.    Appearance: Normal appearance. She is normal weight. She is not ill-appearing, toxic-appearing or diaphoretic.  HENT:     Head: Normocephalic.  Cardiovascular:     Rate and Rhythm: Normal rate.  Pulmonary:     Effort: Pulmonary effort is normal.  Musculoskeletal:     Left foot: Decreased range of motion (painful flexion and extension). Bunion present.  Feet:     Left foot:     Skin integrity: Erythema and warmth present.  Neurological:     General: No focal deficit present.     Mental Status: She is alert and oriented to person, place, and time. Mental status is at baseline.  Psychiatric:        Mood and Affect: Mood normal.        Behavior: Behavior normal.        Thought Content: Thought content normal.        Judgment: Judgment normal.         Assessment & Plan:  Cellulitis of left foot Assessment & Plan: Pt advised to:  Please monitor site for worsening signs/symptoms of infection to include: increasing redness, increasing tenderness, increase in size, and or pustulant drainage from site. If this is to occur please let me know immediately.  Rx cephalexin 500 mg qid x 7 days      Orders: -     Cephalexin; Take 1 capsule (500 mg total) by mouth 4 (four) times daily for 7 days.  Dispense: 28 capsule; Refill: 0  Foot injury, left, initial encounter Assessment & Plan: Xray left foot ordered to r/o fracture and or osteomyelitis Pending results Wear supportive footwear  Ice and elevation recommended   Orders: -     DG Foot Complete Left; Future     Follow up plan: Return in about 1 week (around 07/18/2023) for f/u with Dr. Ermalene Searing for cellulitis left lower foot.  Mort Sawyers, FNP

## 2023-07-11 NOTE — Assessment & Plan Note (Signed)
Pt advised to:  Please monitor site for worsening signs/symptoms of infection to include: increasing redness, increasing tenderness, increase in size, and or pustulant drainage from site. If this is to occur please let me know immediately.  Rx cephalexin 500 mg qid x 7 days

## 2023-07-11 NOTE — Progress Notes (Signed)
No infection seen in the bone. No fractures identified however radiology states hard to see due to malformation (curvature) of toes. I would keep the foot wrapped while walking on it, does pt have an orthopedist or podiatrist?

## 2023-07-26 ENCOUNTER — Ambulatory Visit: Payer: Medicare Other | Attending: Family Medicine

## 2023-07-26 ENCOUNTER — Other Ambulatory Visit: Payer: Self-pay

## 2023-07-26 DIAGNOSIS — M542 Cervicalgia: Secondary | ICD-10-CM | POA: Diagnosis not present

## 2023-07-26 DIAGNOSIS — M6281 Muscle weakness (generalized): Secondary | ICD-10-CM | POA: Diagnosis not present

## 2023-07-26 NOTE — Therapy (Cosign Needed Addendum)
OUTPATIENT PHYSICAL THERAPY CERVICAL EVALUATION   Patient Name: Megan Bradshaw MRN: [id number] DOB:15-Jan-1930, 89 y.o., female Today's Date: 07/26/2023  END OF SESSION:  PT End of Session - 07/26/23 1212     Visit Number 1    Number of Visits 13    Date for PT Re-Evaluation 09/06/23    PT Start Time 1115    PT Stop Time 1157    PT Time Calculation (min) 42 min    Activity Tolerance Patient tolerated treatment well             Past Medical History:  Diagnosis Date   Actinic keratosis    Arthritis    Asthma    COPD (chronic obstructive pulmonary disease) (HCC)    Diabetes (HCC)    Thyroid disease    Past Surgical History:  Procedure Laterality Date   ABDOMINAL HYSTERECTOMY  1984   benign, TAH.   APPENDECTOMY  1939    CATARACT EXTRACTION, BILATERAL     CHOLECYSTECTOMY  1987   NASAL SINUS SURGERY  1990   REPLACEMENT TOTAL KNEE Right 2007   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) 09/22/2014   Hypothyroid 09/22/2014   COPD mixed type (HCC) 08/12/2014    PCP: Judy Pimple, MD  REFERRING PROVIDER: Excell Seltzer, MD  REFERRING DIAG:  M54.2 (ICD-10-CM) - Neck pain on left side    THERAPY DIAG:  Cervicalgia  Muscle weakness (generalized)  Rationale for Evaluation and Treatment: Rehabilitation  ONSET DATE: Couple of months  SUBJECTIVE:                                                                                                                                                                                                          SUBJECTIVE STATEMENT: Pt is a pleasant 89 y.o. female referred to OPPT for left sided neck pain.  Hand dominance: Right  PERTINENT HISTORY:  Pt reports pain in the L side of her neck and shoulder region that has been sustained over the past several months. She believes she may have "pulled  a muscle" at her water aerobics class when lifting 1# weights overhead. Using a heating pack has helped some. Pain is a 1-2/ 10 NPS at rest and the worst pain of 3-4/10 NPS with mobility. Pt reports constant aching pain in the L shoulder area. Pain primarily on the L side of neck with overhead movements. Denies radicular symptoms. Pain located along L levator scapulae region. Pt prescribed Voltarin that has not helped reduce the pain. No changes in pain throughout a 24 hour period. PAIN:  Are you having pain? Yes: NPRS scale: 1-2/10 Pain location: L levator scapulae pain Pain description: Achey, dull  Aggravating factors: Overhead movements Relieving factors: Heating pad   PRECAUTIONS: None  RED FLAGS: None and Bowel or bladder incontinence: No     WEIGHT BEARING RESTRICTIONS: No  FALLS:  Has patient fallen in last 6 months? Yes. Number of falls 1 Tripped over garbage can, fell in the garage two months ago   OCCUPATION: Retired   PLOF: Independent  PATIENT GOALS: Get the pain feeling a little better.   NEXT MD VISIT: N/A  OBJECTIVE:   DIAGNOSTIC FINDINGS:   DG Cervical Spine Complete  on 06/07/2023 (per file) EXAM: CERVICAL SPINE - COMPLETE 4+ VIEW   COMPARISON:  Cervical spine radiographs 10/19/2015   FINDINGS: The cervical spine is imaged through the C6 vertebral body in the lateral projection. There is slight reversal of the normal cervical lordosis centered at C4 with trace retrolisthesis of C4 on C5 and C5 on C6, similar to the prior study. Vertebral body heights  are preserved, without evidence of acute injury.   There is moderate disc space narrowing at C4-C5 through C6-C7 with mild associated degenerative endplate change. There is overall mild facet arthropathy without evidence of high-grade osseous neural foraminal stenosis.   The prevertebral soft tissues are unremarkable. There are coarsened interstitial markings throughout the imaged lungs. There is no focal airspace opacity.  IMPRESSION: 1. No acute finding in the cervical spine. 2. Overall mild for age degenerative changes as above with disc space narrowing and degenerative endplate change at C4-C5 through C6-C7.    PATIENT SURVEYS:  FOTO Will assess at next session  COGNITION: Overall cognitive status: Within functional limits for tasks assessed  SENSATION: WFL  POSTURE: rounded shoulders and increased thoracic kyphosis  PALPATION: TTP at the L levator scapulae insertion, and L peri-scapular musculature.  CERVICAL ROM:   Active ROM A/PROM (deg) eval  Flexion 40  Extension 30  Right lateral flexion 20*  Left lateral flexion 30  Right rotation 60*   Left rotation 65   (Blank rows = not tested)  UPPER EXTREMITY MMT:  MMT Right eval Left eval  Shoulder flexion 4 4-  Shoulder extension    Shoulder abduction 4 4-  Shoulder adduction    Shoulder internal rotation 4 4  Shoulder external rotation 4 4  Middle trapezius    Lower trapezius (tested in seated position resisting against scapular retraction) 4 4-  Upper trap (tested in seated position during shoulder shrug) 4+ 4+  Elbow flexion    Elbow extension    Wrist flexion    Wrist extension    Wrist ulnar deviation    Wrist radial deviation    Wrist pronation    Wrist supination    Grip strength     (Blank rows = not tested)  CERVICAL SPECIAL TESTS:  N/A will attempt DNF endurance testing next session dependent on pt's tolerance to supine  FUNCTIONAL TESTS:  N/A  TODAY'S TREATMENT:                                                                                                                               DATE: 07/26/23     Therapeutic Exercise   Reviewed HEP (reps/ sets/ frequency)   Seated cat and cow x10  Standing shoulder row w/ GTB x10  Standing shoulder ext w/ GTB x10   Improved exercise technique, movement at target joints, use of target muscles after mod verbal, visual, tactile cues.     PATIENT EDUCATION:  Education details: HEP given to pt  Person educated: Patient Education method: Medical illustrator Education comprehension: verbalized understanding and returned demonstration  HOME EXERCISE PROGRAM:   Access Code: [email or web address] URL: [email or web address] Date: 07/26/2023 Prepared by: Loralyn Freshwater  Exercises - Seated Cat Cow  - 1 x daily - 7 x weekly - 2 sets - 10 reps - 5 seconds hold - Standing Shoulder Row with Anchored Resistance  - 1 x daily - 7 x weekly - 2 sets - 10 reps - Single Arm Shoulder Extension with Anchored Resistance  - 1 x daily - 7 x weekly - 2 sets - 10 reps      ASSESSMENT:  CLINICAL IMPRESSION: Patient is a 89 y.o. F who was seen today for physical therapy evaluation and treatment for cervicalgia. Based off of subjective reports and testing no cervical radiculopathy suspected thus no need for cervical radiculopathy testing. Pt presents with significant cervical AROM impairments primarily with extension and bilat side bending lending to significant difficulty with overhead tasks. Pt also presents with abnormal posture leading to forward head/rounded shoulders leading to increased thoracic kyphosis. Pt expressed difficulty assuming supine or prone positioning limiting specific cervical joint mobility, MMT and DNF testing at today's session.These impairments are leading pt to have difficulty with participation in water aerobics classes and other ADL's requiring overhead movements. Following review of HEP pt expressed SOB  sx requiring a prolonged rest break. Vitals were assessed noting 98% SPO2, 75HR, and 167/84 BP, sx resolved following prolonged rest period. Pt will benefit from skilled PT services to address these impairments and maximize return to PLOF.   OBJECTIVE IMPAIRMENTS: cardiopulmonary status limiting activity, decreased activity tolerance, decreased mobility, decreased ROM, decreased strength, hypomobility, impaired flexibility, and pain.   ACTIVITY LIMITATIONS: lifting and reach over head  PARTICIPATION LIMITATIONS: community activity  PERSONAL FACTORS: Age and 1 comorbidity: COPD  are also affecting patient's functional outcome.   REHAB POTENTIAL: Good  CLINICAL DECISION MAKING: Stable/uncomplicated  EVALUATION COMPLEXITY: Low   GOALS: Goals reviewed with patient? Yes  SHORT TERM GOALS: Target date: 08/16/23  Pt will be independent with HEP to improve thoracic posture, increase shoulder strength, and decreas pain with functional activities   Baseline: 07/26/23: HEP given to pt Goal status: INITIAL   LONG TERM GOALS: Target date: 09/06/23  Pt will improve FOTO to target score to demonstrate clinically significant improvement in functional  mobility.   Baseline: 07/26/23: Administer FOTO at next visit Goal status: INITIAL  2.   Pt will improve cervical AROM in all planes (particularly lateral flexion) by at least 7 degrees to demonstrate clinically significant improvement with overhead tasks.  Baseline: 07/26/23: Active ROM A/PROM (deg) eval  Flexion 40  Extension 30  Right lateral flexion 20*  Left lateral flexion 30  Right rotation 60*   Left rotation 65   Goal status: INITIAL  3.  Pt will improve DNF endurance test to at least ___ seconds to match age matched norms to demonstrate clinically significant improvement in cervical flexor strength to improve posture. Baseline: 07/26/23: Defer to next session dependent upon pt's tolerance to supine positioning.  Goal status:  INITIAL  PLAN:  PT FREQUENCY: 1-2x/week  PT DURATION: 6 weeks  PLANNED INTERVENTIONS: Therapeutic exercises, Therapeutic activity, Neuromuscular re-education, Balance training, Gait training, Patient/Family education, Self Care, and Joint mobilization  PLAN FOR NEXT SESSION: DNF Testing, FOTO, Reassess HEP     Marney Doctor, Student-PT  Student-PT   Student was supervised throughout session by PT.   [address] PT, DPT  07/26/2023, 3:06 PM

## 2023-07-26 NOTE — Addendum Note (Signed)
Addended by: Charlene Brooke on: 07/26/2023 05:06 PM   Modules accepted: Orders

## 2023-07-27 ENCOUNTER — Telehealth: Payer: Self-pay | Admitting: Family Medicine

## 2023-07-27 ENCOUNTER — Ambulatory Visit: Payer: Medicare Other | Admitting: Adult Health

## 2023-07-27 ENCOUNTER — Ambulatory Visit (INDEPENDENT_AMBULATORY_CARE_PROVIDER_SITE_OTHER): Payer: Medicare Other | Admitting: Pulmonary Disease

## 2023-07-27 ENCOUNTER — Encounter: Payer: Self-pay | Admitting: Pulmonary Disease

## 2023-07-27 VITALS — BP 132/76 | HR 80 | Temp 98.3°F | Ht 62.5 in | Wt 182.0 lb

## 2023-07-27 DIAGNOSIS — J4489 Other specified chronic obstructive pulmonary disease: Secondary | ICD-10-CM | POA: Diagnosis not present

## 2023-07-27 DIAGNOSIS — E11628 Type 2 diabetes mellitus with other skin complications: Secondary | ICD-10-CM | POA: Diagnosis not present

## 2023-07-27 DIAGNOSIS — L089 Local infection of the skin and subcutaneous tissue, unspecified: Secondary | ICD-10-CM

## 2023-07-27 DIAGNOSIS — J449 Chronic obstructive pulmonary disease, unspecified: Secondary | ICD-10-CM | POA: Diagnosis not present

## 2023-07-27 DIAGNOSIS — G4736 Sleep related hypoventilation in conditions classified elsewhere: Secondary | ICD-10-CM

## 2023-07-27 NOTE — Telephone Encounter (Signed)
Patient called in and stated that she has a infection in her left toe and would like a call back from the nurse if possible

## 2023-07-27 NOTE — Telephone Encounter (Signed)
Agree with recommendations, and as long as no red flag symptoms (fever, rash spreading suddenly, increasing discharge) then reasonable to maintain and attend appt tomorrow with podiatrist for further evaluation.

## 2023-07-27 NOTE — Progress Notes (Signed)
Subjective:    Patient ID: Megan Bradshaw, female    DOB: [birth year], 89 y.o.   MRN: [id number]  Patient Care Team: Excell Seltzer, MD as PCP - General (Family Medicine) Vilinda Flake, [facility] (Inactive) as Pharmacist (Pharmacist) Salena Saner, MD as Consulting Physician (Pulmonary Disease)  Chief Complaint  Patient presents with   Follow-up    Patient reports occ cough, SOB and wheezing with activity.     HPI Megan Bradshaw is an 89 year old former smoker (45 PY) who presents for follow-up on the issue of dyspnea and GOLD class II COPD.  Dyspnea out of proportion to her COPD.  This is a scheduled visit, she was last seen on 02 March 2023 by me.  She believes dyspnea is more manageable lately.  She has been compliant with overnight oxygen and notices feeling refreshed in the morning and less fatigue during the day, this in turn has helped her dyspnea. The patient has been compliant with Breo Ellipta.  In the past she has tried Trelegy however finds that this does not offer any relief over Breo.  She has not had any cough or sputum production.  No recent sinus issues she has been sleeping well when she is able to wear the oxygen.  No nocturnal awakenings of late.  She has not needed rescue inhalation of albuterol lately.   Does not have any fevers, chills or sweats. No hemoptysis.She does not endorse any other new symptomatology today.  She has had recent infection on her left foot and wanted me to take a look at it.  She is otherwise spry and energetic.   DATA: 10/22/14 PFTs: mild obstruction, FEV1 1.36 L (81%), FEV1/FVC 53%, TLC normal, DLCO 51% pred 02/16/16 PFTs: FVC: 2.23 L (90 %pred), FEV1: 1.30 L (71 %pred), FEV1/FVC: 58% , TLC: invalid, DLCO 78% pred, consistent with mild/moderate obstructive defect 10/14/2019 2D echo: LVEF 50 to 55%, normal diastolics, normal pulmonary artery systolic pressure 03/31/2020 overnight oximetry on RA: No oxygen desaturations of clinical significance.  Lowest O2 sat 88% of less than 2 minutes duration.  Average O2 sat 93% 04/28/2022 overnight oximetry on RA: Patient qualified for nocturnal oxygen.  Basal SPO2 was 91% nadir to 87% total of 216 oxygen desaturation events, ODI of 21 06/02/2022 PFTs: FEV1 1.12 L or 78% predicted, FVC is 2.15 L or 110% predicted.  FEV1/FVC 52%, no bronchodilator response. Lung volumes were normal.  Diffusion capacity moderately reduced.  Consistent with moderately severe obstructive airways disease, restriction probable likely due to obesity.  Moderately severe diffusion defect/abnormality. Since the prior study of 02/16/19/2017 there was a decrease in FEV1 by 180 mL. 06/27/2022 echocardiogram: LVEF 55%, normal wall motion abnormality, grade 1 diastolic dysfunction, RV function normal.  No valvular abnormalities.  No evidence of pulmonary hypertension.  Review of Systems A 10 point review of systems was performed and it is as noted above otherwise negative.   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) 09/22/2014   Hypothyroid 09/22/2014   COPD  mixed type (HCC) 08/12/2014    Social History   Tobacco Use   Smoking status: Former    Current packs/day: 0.00    Average packs/day: 1.5 packs/day for 30.0 years (45.0 ttl pk-yrs)    Types: Cigarettes    Start date: 11/14/1957    Quit date: 11/15/1987    Years since quitting: 35.7   Smokeless tobacco: Never  Substance Use Topics   Alcohol use:  No    Alcohol/week: 0.0 standard drinks of alcohol    Allergies  Allergen Reactions   Myrbetriq Theodosia Paling Er] Other (See Comments)    GI Problem   Augmentin [Amoxicillin-Pot Clavulanate] Diarrhea    Current Meds  Medication Sig   ACCU-CHEK GUIDE test strip USE TO TEST TWICE DAILY   Accu-Chek Softclix Lancets lancets Use to check blood sugar 1 times a day   albuterol (PROVENTIL) (2.5 MG/3ML) 0.083% nebulizer solution Take 3 mLs (2.5 mg total) by nebulization every 4 (four) hours as needed for wheezing or shortness of breath.   albuterol (VENTOLIN HFA) 108 (90 Base) MCG/ACT inhaler INHALE 1 TO 2 PUFFS BY MOUTH EVERY 6 HOURS FOR PAIN OR WHEEZING OR SHORTNESS OF BREATH   B Complex-Folic Acid (BENFOTIAMINE MULTI-B) CAPS Take 1 capsule by mouth daily.   Blood Glucose Monitoring Suppl (ACCU-CHEK GUIDE ME) w/Device KIT See admin instructions.   Cholecalciferol (VITAMIN D PO) Take 1 tablet by mouth daily.   clobetasol cream (TEMOVATE) 0.05 % Apply 1 Application topically daily as needed.   fluticasone (FLONASE) 50 MCG/ACT nasal spray Place 2 sprays into both nostrils daily.   fluticasone furoate-vilanterol (BREO ELLIPTA) 100-25 MCG/ACT AEPB Inhale 1 puff into the lungs daily.   gabapentin (NEURONTIN) 100 MG capsule TAKE 1 CAPSULE(100 MG) BY MOUTH THREE TIMES DAILY   glipiZIDE (GLUCOTROL XL) 10 MG 24 hr tablet TAKE 1 TABLET(10 MG) BY MOUTH DAILY WITH BREAKFAST   ipratropium-albuterol (DUONEB) 0.5-2.5 (3) MG/3ML SOLN USE 3 ML VIA NEBULIZER EVERY 6 HOURS AS NEEDED   ketoconazole (NIZORAL) 2 % cream Apply 1 Application topically daily.   levothyroxine (SYNTHROID) 75 MCG tablet TAKE 1 TABLET(75 MCG) BY MOUTH DAILY BEFORE BREAKFAST   metFORMIN (GLUCOPHAGE) 500 MG tablet TAKE 1 TABLET(500 MG) BY MOUTH TWICE DAILY WITH A MEAL   montelukast (SINGULAIR) 10 MG tablet TAKE 1 TABLET(10 MG) BY MOUTH AT BEDTIME   Multiple Vitamin (MULTIVITAMIN) capsule Take 1 capsule by mouth daily.   nystatin cream  (MYCOSTATIN) Apply 1 Application topically 2 (two) times daily.   predniSONE (DELTASONE) 5 MG tablet TAKE 1 TABLET(5 MG) BY MOUTH DAILY WITH BREAKFAST    Immunization History  Administered Date(s) Administered   Fluad Quad(high Dose 65+) 09/03/2020, 08/02/2021, 08/14/2022   Fluad Trivalent(High Dose 65+) 07/11/2023   Influenza,inj,Quad PF,6+ Mos 08/20/2015, 07/22/2016, 08/14/2017, 08/23/2018, 08/29/2019   Influenza-Unspecified 08/14/2014   PFIZER(Purple Top)SARS-COV-2 Vaccination 01/07/2020, 01/28/2020, 10/11/2020   Pneumococcal Conjugate-13 08/20/2015   Pneumococcal Polysaccharide-23 08/29/2019   Respiratory Syncytial Virus Vaccine,Recomb Aduvanted(Arexvy) 08/14/2022        Objective:   BP 132/76 (BP Location: Left Arm, Patient Position: Sitting, Cuff Size: Normal)   Pulse 80   Temp 98.3 F (36.8 C) (Oral)   Ht 5' 2.5" (1.588 m)   Wt 182 lb (82.6 kg)   SpO2 98%   BMI 32.76 kg/m   SpO2: 98 % O2 Device: None (Room air)  GENERAL: Overweight elderly female, quite spry, very well-groomed.  Presents in transport chair today. No conversational dyspnea. No distress. HEAD: Normocephalic, atraumatic. EYES: Pupils  equal, round, reactive to light.  No scleral icterus. NOSE: No turbinate edema  MOUTH: Lower teeth in poor repair.  Oral mucosa moist. NECK: Supple. No thyromegaly. Trachea midline. No JVD.  No adenopathy. PULMONARY: She has increased AP diameter due to kyphosis.Good air entry bilaterally. Coarse with no other adventitious sounds. CARDIOVASCULAR: S1 and S2. Regular rate and rhythm. Soft grade 1/6 systolic ejection murmur left sternal border. No rubs or gallops noted. ABDOMEN: Obese,nondistended. MUSCULOSKELETAL: Hammertoes bilaterally, hallux valgus left, no clubbing, trace ankle edema.  Left great toe has some crusting to the nail with what appears to be a purulent pocket.   NEUROLOGIC: No overt focal deficits noted. Speech is fluent. SKIN: Intact,warm,dry. On limited  exam no rashes. PSYCH: Mood and behavior normal.          Assessment & Plan:     ICD-10-CM   1. Stage 2 moderate COPD by GOLD classification (HCC)  J44.9    Continue Breo 100, 1 inhalation daily Continue as needed albuterol    2. Nocturnal hypoxemia due to obstructive chronic bronchitis  J44.89    G47.36    Patient compliant with oxygen at 2 L/min nocturnally Patient notes benefit of therapy Continue oxygen as is    3. Diabetic foot infection (HCC)  E11.628 Ambulatory referral to Podiatry   L08.9    Will refer to podiatry Suspect patient may need some debridement At the very least more antibiotics      Orders Placed This Encounter  Procedures   Ambulatory referral to Podiatry    Referral Priority:   Urgent    Referral Type:   Consultation    Referral Reason:   Specialty Services Required    Referred to Provider:   Areta Haber    Requested Specialty:   Podiatry    Number of Visits Requested:   1   Will see the patient in follow-up in 4 to 6 months time she is to contact us prior to that time should any new difficulties arise.   Gailen Shelter, MD Advanced Bronchoscopy PCCM [facility]    *This note was dictated using voice recognition software/Dragon.  Despite best efforts to proofread, errors can occur which can change the meaning. Any transcriptional errors that result from this process are unintentional and may not be fully corrected at the time of dictation.

## 2023-07-27 NOTE — Telephone Encounter (Signed)
I spoke with pt; pt seen 07/11/23 by T Alfonse Alpers FNP and pt was given abx and pt was to FU with PCP in one wk. Pt finished abx and toe looked better. Pt did not schedule appt with Dr Ermalene Searing. Pt saw pulmonologist this morning and pt asked pulmonologist to look at toe and pt was advised to FU with podiatrist. Pt already has appt scheduled at [facility] with Dr Gala Lewandowsky on 07/28/23 at 8:45 am. No available appts at [facility] or [facility]. Hayden Pedro FNP said pt needs to be reevaluated.  Pt said she will wait to see foot dr tomorrow with UC & ED precautions given and pt voiced understanding., sending note to Hayden Pedro FNP who is in office and Dr Odie Sera as PCP who is out of office.

## 2023-07-27 NOTE — Telephone Encounter (Signed)
Please call and triage.  

## 2023-07-27 NOTE — Patient Instructions (Addendum)
We have made a referral to podiatry, this is [facility].  They are located in [place].  Dr. Al Corpus.   Continue your oxygen and your inhalers as you are doing.   We will see you in follow-up in 4 to 6 months time call sooner should any new problems arise.

## 2023-07-28 ENCOUNTER — Ambulatory Visit (INDEPENDENT_AMBULATORY_CARE_PROVIDER_SITE_OTHER): Payer: Medicare Other | Admitting: Podiatry

## 2023-07-28 ENCOUNTER — Encounter: Payer: Self-pay | Admitting: Podiatry

## 2023-07-28 DIAGNOSIS — S90212A Contusion of left great toe with damage to nail, initial encounter: Secondary | ICD-10-CM | POA: Diagnosis not present

## 2023-07-28 NOTE — Progress Notes (Signed)
   Chief Complaint  Patient presents with   Ingrown Toenail    PATIENT IS HERE FOR TOE INFECTION GREAT LEFT TOE Patient also has callouses, hammertoes and large bunions    HPI: 89 y.o. female presenting today for evaluation of an injury that was sustained to the left great toe several weeks prior.  She says that she dropped a piece of furniture on her left great toe and noticed some bruising and bleeding.  Currently there is no pain but she is concerned for infection.  Past Medical History:  Diagnosis Date   Actinic keratosis    Arthritis    Asthma    COPD (chronic obstructive pulmonary disease) (HCC)    Diabetes (HCC)    Thyroid disease     Past Surgical History:  Procedure Laterality Date   ABDOMINAL HYSTERECTOMY  1984   benign, TAH.   APPENDECTOMY  1939    CATARACT EXTRACTION, BILATERAL     CHOLECYSTECTOMY  1987   NASAL SINUS SURGERY  1990   REPLACEMENT TOTAL KNEE Right 2007    Allergies  Allergen Reactions   Myrbetriq Theodosia Paling Er] Other (See Comments)    GI Problem   Augmentin [Amoxicillin-Pot Clavulanate] Diarrhea     Physical Exam: General: The patient is alert and oriented x3 in no acute distress.  Dermatology: Skin is warm, dry and supple bilateral lower extremities.  There is some loosely adhered scab at the base of the nail plate left great toe.  With debridement there is healthy underlying skin.  There is no indication of infection. Moderate thickening with nail dystrophy noted. Preulcerative callus also noted to the plantar aspect of the right forefoot without any indication of infection  Vascular: Palpable pedal pulses bilaterally. Capillary refill within normal limits.  No appreciable edema.  No erythema.  Neurological: Grossly intact via light touch  Musculoskeletal Exam: No pedal deformities noted.  Patient ambulatory  Assessment/Plan of Care: 1.  Trauma left hallux nail plate; resolved 2.  Preulcerative callus lesion plantar right  foot  -Light debridement of the left hallux nail plate was performed today along the base of the nail.  Explained that the nail should grow out with time.  Recommend observation for now -Excisional debridement of the preulcerative callus lesion was performed to the right plantar forefoot using a 312 scalpel without incident or bleeding -Continue diabetic shoes and insoles -Return to clinic as needed       Felecia Shelling, DPM [facility]  Dr. Felecia Shelling, DPM    [address] [place], [place] [address]                Office [phone]  Fax [phone]

## 2023-07-31 ENCOUNTER — Ambulatory Visit: Payer: Medicare Other

## 2023-07-31 DIAGNOSIS — M6281 Muscle weakness (generalized): Secondary | ICD-10-CM

## 2023-07-31 DIAGNOSIS — M542 Cervicalgia: Secondary | ICD-10-CM

## 2023-07-31 NOTE — Therapy (Addendum)
OUTPATIENT PHYSICAL THERAPY CERVICAL EVALUATION   Patient Name: Megan Bradshaw MRN: [id number] DOB:1930-03-22, 89 y.o., female Today's Date: 07/31/2023  END OF SESSION:  PT End of Session - 07/31/23 1108     Visit Number 2    Number of Visits 13    Date for PT Re-Evaluation 09/06/23    PT Start Time 1113    PT Stop Time 1156    PT Time Calculation (min) 43 min    Activity Tolerance Patient tolerated treatment well             Past Medical History:  Diagnosis Date   Actinic keratosis    Arthritis    Asthma    COPD (chronic obstructive pulmonary disease) (HCC)    Diabetes (HCC)    Thyroid disease    Past Surgical History:  Procedure Laterality Date   ABDOMINAL HYSTERECTOMY  1984   benign, TAH.   APPENDECTOMY  1939    CATARACT EXTRACTION, BILATERAL     CHOLECYSTECTOMY  1987   NASAL SINUS SURGERY  1990   REPLACEMENT TOTAL KNEE Right 2007   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) 09/22/2014   Hypothyroid 09/22/2014   COPD mixed type (HCC) 08/12/2014    PCP: Judy Pimple, MD  REFERRING PROVIDER: Excell Seltzer, MD  REFERRING DIAG:  M54.2 (ICD-10-CM) - Neck pain on left side    THERAPY DIAG:  Cervicalgia  Muscle weakness (generalized)  Rationale for Evaluation and Treatment: Rehabilitation  ONSET DATE: Couple of months  SUBJECTIVE:                                                                                                                                                                                                          SUBJECTIVE STATEMENT: Pt reports 1-2/10 NPS in the L shoulder/ neck today. States she was not able to perform her HEP over the weekend due to potential toe infection.  Hand dominance: Right  PERTINENT HISTORY:  Pt reports pain in the L side of her neck and shoulder region that has been  sustained over the past several months. She believes she may have "pulled a muscle" at her water aerobics class when lifting 1# weights overhead. Using a heating pack has helped some. Pain is a 1-2/ 10 NPS at rest and the worst pain of 3-4/10 NPS with mobility. Pt reports constant aching pain in the L shoulder area. Pain primarily on the L side of neck with overhead movements. Denies radicular symptoms. Pain located along L levator scapulae region. Pt prescribed Voltarin that has not helped reduce the pain. No changes in pain throughout a 24 hour period. PAIN:  Are you having pain? Yes: NPRS scale: 1-2/10 Pain location: L levator scapulae pain Pain description: Achey, dull  Aggravating factors: Overhead movements Relieving factors: Heating pad   PRECAUTIONS: None  RED FLAGS: None and Bowel or bladder incontinence: No     WEIGHT BEARING RESTRICTIONS: No  FALLS:  Has patient fallen in last 6 months? Yes. Number of falls 1 Tripped over garbage can, fell in the garage two months ago   OCCUPATION: Retired   PLOF: Independent  PATIENT GOALS: Get the pain feeling a little better.   NEXT MD VISIT: N/A  OBJECTIVE:   DIAGNOSTIC FINDINGS:   DG Cervical Spine Complete  on 06/07/2023 (per file) EXAM: CERVICAL SPINE - COMPLETE 4+ VIEW   COMPARISON:  Cervical spine radiographs 10/19/2015   FINDINGS: The cervical spine is imaged through the C6 vertebral body in the lateral projection. There is slight reversal of the normal cervical lordosis centered at C4 with trace retrolisthesis of C4 on C5 and  C5 on C6, similar to the prior study. Vertebral body heights are preserved, without evidence of acute injury.   There is moderate disc space narrowing at C4-C5 through C6-C7 with mild associated degenerative endplate change. There is overall mild facet arthropathy without evidence of high-grade osseous neural foraminal stenosis.   The prevertebral soft tissues are unremarkable. There are coarsened interstitial markings throughout the imaged lungs. There is no focal airspace opacity.  IMPRESSION: 1. No acute finding in the cervical spine. 2. Overall mild for age degenerative changes as above with disc space narrowing and degenerative endplate change at C4-C5 through C6-C7.    PATIENT SURVEYS:  FOTO 51/63  COGNITION: Overall cognitive status: Within functional limits for tasks assessed  SENSATION: WFL  POSTURE: rounded shoulders and increased thoracic kyphosis  PALPATION: TTP at the L levator scapulae insertion, and L peri-scapular musculature.  CERVICAL ROM:   Active ROM A/PROM (deg) eval  Flexion 40  Extension 30  Right lateral flexion 20*  Left lateral flexion 30  Right rotation 60*   Left rotation 65   (Blank rows = not tested)  UPPER EXTREMITY MMT:  MMT Right eval Left eval  Shoulder flexion 4 4-  Shoulder extension    Shoulder abduction 4 4-  Shoulder adduction    Shoulder internal rotation 4 4  Shoulder external rotation 4 4  Middle trapezius    Lower trapezius (tested in seated position resisting against scapular retraction) 4 4-  Upper trap (tested in seated position during shoulder shrug) 4+ 4+  Elbow flexion    Elbow extension    Wrist flexion    Wrist extension    Wrist ulnar deviation    Wrist radial deviation    Wrist pronation    Wrist supination    Grip strength     (Blank rows = not tested)  CERVICAL SPECIAL TESTS:  N/A will attempt DNF endurance testing next session dependent on pt's  tolerance to supine  FUNCTIONAL TESTS:   N/A  TODAY'S TREATMENT:                                                                                                                              DATE: 07/31/23   UBE x2 minute forward/ x2 minute backward lvl 3.0   Therapeutic Exercise Review HEP (reps/ sets/ frequency)   Seated cat and cow 2 x10  Standing shoulder row w/ Blue TB 2 x10  Standing shoulder ext w/ Blue TB 2 x12 Shoulder horizontal abduction w/ GTB 2 x10 L Levator Scapulae stretch 2 x30sec Cervical Rotational SNAGS R/L 5 x10sec/ each side  Cervical Extension x10/ 5 sec hold   TTP palpated at L levator scapulae, TP release performed including w/ lacrosse ball. ~5 minutes.   PATIENT EDUCATION:  Education details: HEP given to pt  Person educated: Patient Education method: Medical illustrator Education comprehension: verbalized understanding and returned demonstration  HOME EXERCISE PROGRAM:   Access Code: [email or web address] URL: [email or web address] Date: 07/26/2023 Prepared by: Loralyn Freshwater  Exercises - Seated Cat Cow  - 1 x daily - 7 x weekly - 2 sets - 10 reps - 5 seconds hold - Standing Shoulder Row with Anchored Resistance  - 1 x daily - 7 x weekly - 2 sets - 10 reps - Single Arm Shoulder Extension with Anchored Resistance  - 1 x daily - 7 x weekly - 2 sets - 10 reps   ASSESSMENT:  CLINICAL IMPRESSION: Session focused on reviewing pt's HEP, and progressing L cervical/ shoulder strength and stretching. Pt notes improvements in L shoulder strength noted w/ progressing from GTB to blue TB with shoulder exercises. Pt continues to note TTP in the L levator scap noting some intra-session verbalized relief in L levator scap pain. Pt's HEP updated today, pt verbalized and demonstrated comprehension of updated HEP. Pt would continue to benefit from skilled PT interventions to address remaining L cervical/ shoulder strength and mobility deficits.   OBJECTIVE IMPAIRMENTS: cardiopulmonary status  limiting activity, decreased activity tolerance, decreased mobility, decreased ROM, decreased strength, hypomobility, impaired flexibility, and pain.   ACTIVITY LIMITATIONS: lifting and reach over head  PARTICIPATION LIMITATIONS: community activity  PERSONAL FACTORS: Age and 1 comorbidity: COPD  are also affecting patient's functional outcome.   REHAB POTENTIAL: Good  CLINICAL DECISION MAKING: Stable/uncomplicated  EVALUATION COMPLEXITY: Low   GOALS: Goals reviewed with patient? Yes  SHORT TERM GOALS: Target date: 08/16/23  Pt will be independent with HEP to improve thoracic posture, increase shoulder strength, and decreas pain with functional activities   Baseline: 07/26/23: HEP given to pt Goal status: INITIAL   LONG TERM GOALS: Target date: 09/06/23  Pt will improve FOTO to target score to demonstrate clinically significant improvement in functional mobility.   Baseline: 07/26/23: Administer FOTO at next visit, 07/31/23: 51/63  Goal status: INITIAL  2.   Pt will improve cervical AROM in all planes (particularly lateral flexion) by  at least 7 degrees to demonstrate clinically significant improvement with overhead tasks.  Baseline: 07/26/23: Active ROM A/PROM (deg) eval  Flexion 40  Extension 30  Right lateral flexion 20*  Left lateral flexion 30  Right rotation 60*   Left rotation 65   Goal status: INITIAL  3.  Pt will improve DNF endurance test to at least ___ seconds to match age matched norms to demonstrate clinically significant improvement in cervical flexor strength to improve posture. Baseline: 07/26/23: Defer to next session dependent upon pt's tolerance to supine positioning.  Goal status: INITIAL  PLAN:  PT FREQUENCY: 1-2x/week  PT DURATION: 6 weeks  PLANNED INTERVENTIONS: Therapeutic exercises, Therapeutic activity, Neuromuscular re-education, Balance training, Gait training, Patient/Family education, Self Care, and Joint mobilization  PLAN FOR NEXT  SESSION: Continue to progress L shoulder/ cervical mobility.  Lovie Macadamia, SPT  Delphia Grates. Fairly IV, PT, DPT Physical Therapist-   [facility]  07/31/2023, 12:59 PM

## 2023-08-07 ENCOUNTER — Ambulatory Visit: Payer: Medicare Other

## 2023-08-07 DIAGNOSIS — M542 Cervicalgia: Secondary | ICD-10-CM

## 2023-08-07 DIAGNOSIS — M6281 Muscle weakness (generalized): Secondary | ICD-10-CM | POA: Diagnosis not present

## 2023-08-07 NOTE — Therapy (Addendum)
OUTPATIENT PHYSICAL THERAPY CERVICAL TREATMENT   Patient Name: Megan Bradshaw MRN: [id number] DOB:16-Nov-1929, 89 y.o., female Today's Date: 08/07/2023  END OF SESSION:  PT End of Session - 08/07/23 1053     Visit Number 3    Number of Visits 13    Date for PT Re-Evaluation 09/06/23    PT Start Time 1100    PT Stop Time 1143    PT Time Calculation (min) 43 min    Activity Tolerance Patient tolerated treatment well             Past Medical History:  Diagnosis Date   Actinic keratosis    Arthritis    Asthma    COPD (chronic obstructive pulmonary disease) (HCC)    Diabetes (HCC)    Thyroid disease    Past Surgical History:  Procedure Laterality Date   ABDOMINAL HYSTERECTOMY  1984   benign, TAH.   APPENDECTOMY  1939    CATARACT EXTRACTION, BILATERAL     CHOLECYSTECTOMY  1987   NASAL SINUS SURGERY  1990   REPLACEMENT TOTAL KNEE Right 2007   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) 09/22/2014   Hypothyroid 09/22/2014   COPD mixed type (HCC) 08/12/2014    PCP: Judy Pimple, MD  REFERRING PROVIDER: Excell Seltzer, MD  REFERRING DIAG:  M54.2 (ICD-10-CM) - Neck pain on left side    THERAPY DIAG:  Cervicalgia  Muscle weakness (generalized)  Rationale for Evaluation and Treatment: Rehabilitation  ONSET DATE: Couple of months  SUBJECTIVE:                                                                                                                                                                                                          SUBJECTIVE STATEMENT: Pt reports 4/10 NPS in the L shoulder/ neck today. Pt reports being HEP compliant.  Hand dominance: Right  PERTINENT HISTORY:  Pt reports pain in the L side of her neck and shoulder region that has been sustained over the past several months. She believes she may have "pulled  a muscle" at her water aerobics class when lifting 1# weights overhead. Using a heating pack has helped some. Pain is a 1-2/ 10 NPS at rest and the worst pain of 3-4/10 NPS with mobility. Pt reports constant aching pain in the L shoulder area. Pain primarily on the L side of neck with overhead movements. Denies radicular symptoms. Pain located along L levator scapulae region. Pt prescribed Voltarin that has not helped reduce the pain. No changes in pain throughout a 24 hour period. PAIN:  Are you having pain? Yes: NPRS scale: 4/10 Pain location: L levator scapulae pain Pain description: Achey, dull  Aggravating factors: Overhead movements Relieving factors: Heating pad   PRECAUTIONS: None  RED FLAGS: None and Bowel or bladder incontinence: No     WEIGHT BEARING RESTRICTIONS: No  FALLS:  Has patient fallen in last 6 months? Yes. Number of falls 1 Tripped over garbage can, fell in the garage two months ago   OCCUPATION: Retired   PLOF: Independent  PATIENT GOALS: Get the pain feeling a little better.   NEXT MD VISIT: N/A  OBJECTIVE:   DIAGNOSTIC FINDINGS:   DG Cervical Spine Complete  on 06/07/2023 (per file) EXAM: CERVICAL SPINE - COMPLETE 4+ VIEW   COMPARISON:  Cervical spine radiographs 10/19/2015   FINDINGS: The cervical spine is imaged through the C6 vertebral body in the lateral projection. There is slight reversal of the normal cervical lordosis centered at C4 with trace retrolisthesis of C4 on C5 and C5 on C6, similar to the prior study. Vertebral body heights  are preserved, without evidence of acute injury.   There is moderate disc space narrowing at C4-C5 through C6-C7 with mild associated degenerative endplate change. There is overall mild facet arthropathy without evidence of high-grade osseous neural foraminal stenosis.   The prevertebral soft tissues are unremarkable. There are coarsened interstitial markings throughout the imaged lungs. There is no focal airspace opacity.  IMPRESSION: 1. No acute finding in the cervical spine. 2. Overall mild for age degenerative changes as above with disc space narrowing and degenerative endplate change at C4-C5 through C6-C7.    PATIENT SURVEYS:  FOTO 51/63  COGNITION: Overall cognitive status: Within functional limits for tasks assessed  SENSATION: WFL  POSTURE: rounded shoulders and increased thoracic kyphosis  PALPATION: TTP at the L levator scapulae insertion, and L peri-scapular musculature.  CERVICAL ROM:   Active ROM A/PROM (deg) eval  Flexion 40  Extension 30  Right lateral flexion 20*  Left lateral flexion 30  Right rotation 60*   Left rotation 65   (Blank rows = not tested)  UPPER EXTREMITY MMT:  MMT Right eval Left eval  Shoulder flexion 4 4-  Shoulder extension    Shoulder abduction 4 4-  Shoulder adduction    Shoulder internal rotation 4 4  Shoulder external rotation 4 4  Middle trapezius    Lower trapezius (tested in seated position resisting against scapular retraction) 4 4-  Upper trap (tested in seated position during shoulder shrug) 4+ 4+  Elbow flexion    Elbow extension    Wrist flexion    Wrist extension    Wrist ulnar deviation    Wrist radial deviation    Wrist pronation    Wrist supination    Grip strength     (Blank rows = not tested)  CERVICAL SPECIAL TESTS:  N/A will attempt DNF endurance testing next session dependent on pt's tolerance to supine  FUNCTIONAL TESTS:  N/A  TODAY'S TREATMENT:  DATE: 08/07/23   Therapeutic Exercise: UBE x3 minute forward/ x1 minute backward lvl 3.0  L levator scapulae stretch 2 x30sec Standing shoulder row w/ Blue TB 1 x10, black TB 1 x10 Standing shoulder ext w/ Black TB 2 x10 Shoulder horizontal abduction w/ GTB 2 x12 Overhead punches w/ 1# DB RUE/LUE 2 x8  Crossbody punches w/ 1# DB RUE/LUE 3 x10  Shoulder "Y"'s w/ 4#DB RUE/LUE 3x 8 Seated cat and cows x10  Manual Therapy:  TTP palpated at L levator scapulae and upper trapezius. TP release performed including w/ lacrosse ball. ~8 minutes.   PATIENT EDUCATION:  Education details: HEP given to pt  Person educated: Patient Education method: Medical illustrator Education comprehension: verbalized understanding and returned demonstration  HOME EXERCISE PROGRAM:   Access Code: [email or web address] URL: [email or web address] Date: 07/26/2023 Prepared by: Loralyn Freshwater  Exercises - Seated Cat Cow  - 1 x daily - 7 x weekly - 2 sets - 10 reps - 5 seconds hold - Standing Shoulder Row with Anchored Resistance  - 1 x daily - 7 x weekly - 2 sets - 10 reps - Single Arm Shoulder Extension with Anchored Resistance  - 1 x daily - 7 x weekly - 2 sets - 10 reps   ASSESSMENT:  CLINICAL IMPRESSION: Session focused on progressing L cervical/ shoulder strength and stretching. Pt notes improvements in L shoulder strength noted w/ tolerance to the addition of a 1# DB to UE exercises at today's session. Pt continues to note TTP in the L levator scap into the upper trapezius noting mild pain-relief following STM w/ lacrosse ball. Pt would continue to benefit from skilled PT interventions to address remaining L cervical/ shoulder strength and mobility deficits.   OBJECTIVE IMPAIRMENTS: cardiopulmonary status limiting activity, decreased activity tolerance, decreased mobility, decreased ROM, decreased strength,  hypomobility, impaired flexibility, and pain.   ACTIVITY LIMITATIONS: lifting and reach over head  PARTICIPATION LIMITATIONS: community activity  PERSONAL FACTORS: Age and 1 comorbidity: COPD  are also affecting patient's functional outcome.   REHAB POTENTIAL: Good  CLINICAL DECISION MAKING: Stable/uncomplicated  EVALUATION COMPLEXITY: Low   GOALS: Goals reviewed with patient? Yes  SHORT TERM GOALS: Target date: 08/16/23  Pt will be independent with HEP to improve thoracic posture, increase shoulder strength, and decreas pain with functional activities   Baseline: 07/26/23: HEP given to pt Goal status: INITIAL   LONG TERM GOALS: Target date: 09/06/23  Pt will improve FOTO to target score to demonstrate clinically significant improvement in functional mobility.   Baseline: 07/26/23: Administer FOTO at next visit, 07/31/23: 51/63  Goal status: INITIAL  2.   Pt will improve cervical AROM in all planes (particularly lateral flexion) by at least 7 degrees to demonstrate clinically significant improvement with overhead tasks.  Baseline: 07/26/23: Active ROM A/PROM (deg) eval  Flexion 40  Extension 30  Right lateral flexion 20*  Left lateral flexion 30  Right rotation 60*   Left rotation 65   Goal status: INITIAL  3.  Pt will improve DNF endurance test to at least ___ seconds to match age matched norms to demonstrate clinically significant improvement in cervical flexor strength to improve posture. Baseline: 07/26/23: Defer to next session dependent upon pt's tolerance to supine positioning.  Goal status: INITIAL  PLAN:  PT FREQUENCY: 1-2x/week  PT DURATION: 6 weeks  PLANNED INTERVENTIONS: Therapeutic exercises, Therapeutic activity, Neuromuscular re-education, Balance training, Gait training, Patient/Family education, Self Care, and Joint mobilization  PLAN FOR NEXT SESSION: Continue to progress L  shoulder/ cervical mobility, progress HEP   Lovie Macadamia,  SPT  Delphia Grates. Fairly IV, PT, DPT Physical Therapist- [place]  [facility]  08/07/2023, 12:58 PM

## 2023-08-09 ENCOUNTER — Ambulatory Visit: Payer: Medicare Other

## 2023-08-09 DIAGNOSIS — M6281 Muscle weakness (generalized): Secondary | ICD-10-CM | POA: Diagnosis not present

## 2023-08-09 DIAGNOSIS — M542 Cervicalgia: Secondary | ICD-10-CM | POA: Diagnosis not present

## 2023-08-09 NOTE — Therapy (Addendum)
OUTPATIENT PHYSICAL THERAPY CERVICAL TREATMENT   Patient Name: Megan Bradshaw MRN: [id number] DOB:Aug 16, 1930, 89 y.o., female Today's Date: 08/09/2023  END OF SESSION:  PT End of Session - 08/09/23 1109     Visit Number 4    Number of Visits 13    Date for PT Re-Evaluation 09/06/23    PT Start Time 1112    PT Stop Time 1156    PT Time Calculation (min) 44 min    Activity Tolerance Patient tolerated treatment well             Past Medical History:  Diagnosis Date   Actinic keratosis    Arthritis    Asthma    COPD (chronic obstructive pulmonary disease) (HCC)    Diabetes (HCC)    Thyroid disease    Past Surgical History:  Procedure Laterality Date   ABDOMINAL HYSTERECTOMY  1984   benign, TAH.   APPENDECTOMY  1939    CATARACT EXTRACTION, BILATERAL     CHOLECYSTECTOMY  1987   NASAL SINUS SURGERY  1990   REPLACEMENT TOTAL KNEE Right 2007   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) 09/22/2014   Hypothyroid 09/22/2014   COPD mixed type (HCC) 08/12/2014    PCP: Judy Pimple, MD  REFERRING PROVIDER: Excell Seltzer, MD  REFERRING DIAG:  M54.2 (ICD-10-CM) - Neck pain on left side    THERAPY DIAG:  Cervicalgia  Muscle weakness (generalized)  Rationale for Evaluation and Treatment: Rehabilitation  ONSET DATE: Couple of months  SUBJECTIVE:                                                                                                                                                                                                          SUBJECTIVE STATEMENT: Pt reports 2/10 NPS in the L shoulder/ neck today. Pt reports being HEP compliant.   Hand dominance: Right  PERTINENT HISTORY:  Pt reports pain in the L side of her neck and shoulder region that has been sustained over the past several months. She believes she may have "  pulled a muscle" at her water aerobics class when lifting 1# weights overhead. Using a heating pack has helped some. Pain is a 1-2/ 10 NPS at rest and the worst pain of 3-4/10 NPS with mobility. Pt reports constant aching pain in the L shoulder area. Pain primarily on the L side of neck with overhead movements. Denies radicular symptoms. Pain located along L levator scapulae region. Pt prescribed Voltarin that has not helped reduce the pain. No changes in pain throughout a 24 hour period. PAIN:  Are you having pain? Yes: NPRS scale: 2/10 Pain location: L levator scapulae pain Pain description: Achey, dull  Aggravating factors: Overhead movements Relieving factors: Heating pad   PRECAUTIONS: None  RED FLAGS: None and Bowel or bladder incontinence: No     WEIGHT BEARING RESTRICTIONS: No  FALLS:  Has patient fallen in last 6 months? Yes. Number of falls 1 Tripped over garbage can, fell in the garage two months ago   OCCUPATION: Retired   PLOF: Independent  PATIENT GOALS: Get the pain feeling a little better.   NEXT MD VISIT: N/A  OBJECTIVE:   DIAGNOSTIC FINDINGS:   DG Cervical Spine Complete  on 06/07/2023 (per file) EXAM: CERVICAL SPINE - COMPLETE 4+ VIEW   COMPARISON:  Cervical spine radiographs 10/19/2015   FINDINGS: The cervical spine is imaged through the C6 vertebral body in the lateral projection. There is slight reversal of the normal cervical lordosis centered at C4 with trace retrolisthesis of C4 on C5 and C5 on C6, similar to the prior study. Vertebral body heights  are preserved, without evidence of acute injury.   There is moderate disc space narrowing at C4-C5 through C6-C7 with mild associated degenerative endplate change. There is overall mild facet arthropathy without evidence of high-grade osseous neural foraminal stenosis.   The prevertebral soft tissues are unremarkable. There are coarsened interstitial markings throughout the imaged lungs. There is no focal airspace opacity.  IMPRESSION: 1. No acute finding in the cervical spine. 2. Overall mild for age degenerative changes as above with disc space narrowing and degenerative endplate change at C4-C5 through C6-C7.    PATIENT SURVEYS:  FOTO 51/63  COGNITION: Overall cognitive status: Within functional limits for tasks assessed  SENSATION: WFL  POSTURE: rounded shoulders and increased thoracic kyphosis  PALPATION: TTP at the L levator scapulae insertion, and L peri-scapular musculature.  CERVICAL ROM:   Active ROM A/PROM (deg) eval  Flexion 40  Extension 30  Right lateral flexion 20*  Left lateral flexion 30  Right rotation 60*   Left rotation 65   (Blank rows = not tested)  UPPER EXTREMITY MMT:  MMT Right eval Left eval  Shoulder flexion 4 4-  Shoulder extension    Shoulder abduction 4 4-  Shoulder adduction    Shoulder internal rotation 4 4  Shoulder external rotation 4 4  Middle trapezius    Lower trapezius (tested in seated position resisting against scapular retraction) 4 4-  Upper trap (tested in seated position during shoulder shrug) 4+ 4+  Elbow flexion    Elbow extension    Wrist flexion    Wrist extension    Wrist ulnar deviation    Wrist radial deviation    Wrist pronation    Wrist supination    Grip strength     (Blank rows = not tested)  CERVICAL SPECIAL TESTS:  N/A will attempt DNF endurance testing next session dependent on pt's tolerance to supine  FUNCTIONAL TESTS:  N/A  TODAY'S TREATMENT:  DATE: 08/09/23   Therapeutic Exercise: UBE x3 minute forward/ x1 minute backward lvl 3.0  L levator scapulae stretch 2 x30sec Standing shoulder row w/ Black TB 3 x10 Standing shoulder ext w/ Black TB 3 x10 Standing shoulder "T" rows w/ blue TB 2 x10 Standing shoulder shrugs w/ 3# DB in bilat UE 's 2 x10 Standing shoulder abduction w/ 2# DB in bilat UE's 3 x8 Crossbody punches w/ 1# DB RUE/LUE 2 x10  Shoulder horizontal abduction w/ GTB 2 x12 Shoulder "Y"'s w/ 1#DB RUE/LUE 3x 8  Manual Therapy:  TTP palpated at L levator scapulae, upper trapezius and peri-scapulars. TP release performed including w/ lacrosse ball. ~4 minutes.   PATIENT EDUCATION:  Education details: HEP given to pt  Person educated: Patient Education method: Medical illustrator Education comprehension: verbalized understanding and returned demonstration  HOME EXERCISE PROGRAM:   Access Code: [email or web address] URL: [email or web address] Date: 07/26/2023 Prepared by: Loralyn Freshwater  Exercises - Seated Cat Cow  - 1 x daily - 7 x weekly - 2 sets - 10 reps - 5 seconds hold - Standing Shoulder Row with Anchored Resistance  - 1 x daily - 7 x weekly - 2 sets - 10 reps - Single Arm Shoulder Extension with Anchored Resistance  - 1 x daily - 7 x weekly - 2 sets - 10 reps   ASSESSMENT:  CLINICAL IMPRESSION: Session focused on progressing L cervical/ shoulder strength and stretching. Pt notes improvements in L shoulder strength noted w/ increased activity tolerance in bilat shoulders displayed w/ increased resistance band levels and DB #'s used during today's session. Pt continues to have TTP at the L peri-scapular, upper trapezius and levator scapulae area noting some relief w/ STM w/ the lacrosse ball. Pt would continue to benefit from skilled PT interventions to address remaining L cervical/ shoulder strength and mobility  deficits.   OBJECTIVE IMPAIRMENTS: cardiopulmonary status limiting activity, decreased activity tolerance, decreased mobility, decreased ROM, decreased strength, hypomobility, impaired flexibility, and pain.   ACTIVITY LIMITATIONS: lifting and reach over head  PARTICIPATION LIMITATIONS: community activity  PERSONAL FACTORS: Age and 1 comorbidity: COPD  are also affecting patient's functional outcome.   REHAB POTENTIAL: Good  CLINICAL DECISION MAKING: Stable/uncomplicated  EVALUATION COMPLEXITY: Low   GOALS: Goals reviewed with patient? Yes  SHORT TERM GOALS: Target date: 08/16/23  Pt will be independent with HEP to improve thoracic posture, increase shoulder strength, and decreas pain with functional activities   Baseline: 07/26/23: HEP given to pt Goal status: INITIAL   LONG TERM GOALS: Target date: 09/06/23  Pt will improve FOTO to target score to demonstrate clinically significant improvement in functional mobility.   Baseline: 07/26/23: Administer FOTO at next visit, 07/31/23: 51/63  Goal status: INITIAL  2.   Pt will improve cervical AROM in all planes (particularly lateral flexion) by at least 7 degrees to demonstrate clinically significant improvement with overhead tasks.  Baseline: 07/26/23: Active ROM A/PROM (deg) eval  Flexion 40  Extension 30  Right lateral flexion 20*  Left lateral flexion 30  Right rotation 60*   Left rotation 65   Goal status: INITIAL  3.  Pt will improve DNF endurance test to at least ___ seconds to match age matched norms to demonstrate clinically significant improvement in cervical flexor strength to improve posture. Baseline: 07/26/23: Defer to next session dependent upon pt's tolerance to supine positioning.  Goal status: INITIAL  PLAN:  PT FREQUENCY: 1-2x/week  PT DURATION: 6 weeks  PLANNED INTERVENTIONS: Therapeutic exercises, Therapeutic activity,  Neuromuscular re-education, Balance training, Gait training, Patient/Family  education, Self Care, and Joint mobilization  PLAN FOR NEXT SESSION: Update HEP, Continue to progress L shoulder/ cervical mobility  Lovie Macadamia, SPT  Delphia Grates. Fairly IV, PT, DPT Physical Therapist- [place]  [facility]  08/09/2023, 12:53 PM

## 2023-08-14 ENCOUNTER — Ambulatory Visit: Payer: Medicare Other

## 2023-08-14 DIAGNOSIS — M6281 Muscle weakness (generalized): Secondary | ICD-10-CM | POA: Diagnosis not present

## 2023-08-14 DIAGNOSIS — M542 Cervicalgia: Secondary | ICD-10-CM

## 2023-08-14 NOTE — Therapy (Addendum)
OUTPATIENT PHYSICAL THERAPY CERVICAL TREATMENT   Patient Name: Jadence Kinlaw MRN: [id number] DOB:27-Jun-1930, 89 y.o., female Today's Date: 08/14/2023  END OF SESSION:  PT End of Session - 08/14/23 1106     Visit Number 5    Number of Visits 13    Date for PT Re-Evaluation 09/06/23    PT Start Time 1108    PT Stop Time 1154    PT Time Calculation (min) 46 min    Activity Tolerance Patient tolerated treatment well             Past Medical History:  Diagnosis Date   Actinic keratosis    Arthritis    Asthma    COPD (chronic obstructive pulmonary disease) (HCC)    Diabetes (HCC)    Thyroid disease    Past Surgical History:  Procedure Laterality Date   ABDOMINAL HYSTERECTOMY  1984   benign, TAH.   APPENDECTOMY  1939    CATARACT EXTRACTION, BILATERAL     CHOLECYSTECTOMY  1987   NASAL SINUS SURGERY  1990   REPLACEMENT TOTAL KNEE Right 2007   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) 09/22/2014   Hypothyroid 09/22/2014   COPD mixed type (HCC) 08/12/2014    PCP: Judy Pimple, MD  REFERRING PROVIDER: Excell Seltzer, MD  REFERRING DIAG:  M54.2 (ICD-10-CM) - Neck pain on left side    THERAPY DIAG:  Cervicalgia  Muscle weakness (generalized)  Rationale for Evaluation and Treatment: Rehabilitation  ONSET DATE: Couple of months  SUBJECTIVE:                                                                                                                                                                                                          SUBJECTIVE STATEMENT: Pt reports no L shoulder/ neck pain. No notable changes over the weekend.   Hand dominance: Right  PERTINENT HISTORY:  Pt reports pain in the L side of her neck and shoulder region that has been sustained over the past several months. She believes she may have "pulled a  muscle" at her water aerobics class when lifting 1# weights overhead. Using a heating pack has helped some. Pain is a 1-2/ 10 NPS at rest and the worst pain of 3-4/10 NPS with mobility. Pt reports constant aching pain in the L shoulder area. Pain primarily on the L side of neck with overhead movements. Denies radicular symptoms. Pain located along L levator scapulae region. Pt prescribed Voltarin that has not helped reduce the pain. No changes in pain throughout a 24 hour period. PAIN:  Are you having pain? Yes: NPRS scale: 2/10 Pain location: L levator scapulae pain Pain description: Achey, dull  Aggravating factors: Overhead movements Relieving factors: Heating pad   PRECAUTIONS: None  RED FLAGS: None and Bowel or bladder incontinence: No     WEIGHT BEARING RESTRICTIONS: No  FALLS:  Has patient fallen in last 6 months? Yes. Number of falls 1 Tripped over garbage can, fell in the garage two months ago   OCCUPATION: Retired   PLOF: Independent  PATIENT GOALS: Get the pain feeling a little better.   NEXT MD VISIT: N/A  OBJECTIVE:   DIAGNOSTIC FINDINGS:   DG Cervical Spine Complete  on 06/07/2023 (per file) EXAM: CERVICAL SPINE - COMPLETE 4+ VIEW   COMPARISON:  Cervical spine radiographs 10/19/2015   FINDINGS: The cervical spine is imaged through the C6 vertebral body in the lateral projection. There is slight reversal of the normal cervical lordosis centered at C4 with trace retrolisthesis of C4 on C5 and C5 on C6, similar to the prior study. Vertebral body heights  are preserved, without evidence of acute injury.   There is moderate disc space narrowing at C4-C5 through C6-C7 with mild associated degenerative endplate change. There is overall mild facet arthropathy without evidence of high-grade osseous neural foraminal stenosis.   The prevertebral soft tissues are unremarkable. There are coarsened interstitial markings throughout the imaged lungs. There is no focal airspace opacity.  IMPRESSION: 1. No acute finding in the cervical spine. 2. Overall mild for age degenerative changes as above with disc space narrowing and degenerative endplate change at C4-C5 through C6-C7.    PATIENT SURVEYS:  FOTO 51/63  COGNITION: Overall cognitive status: Within functional limits for tasks assessed  SENSATION: WFL  POSTURE: rounded shoulders and increased thoracic kyphosis  PALPATION: TTP at the L levator scapulae insertion, and L peri-scapular musculature.  CERVICAL ROM:   Active ROM A/PROM (deg) eval  Flexion 40  Extension 30  Right lateral flexion 20*  Left lateral flexion 30  Right rotation 60*   Left rotation 65   (Blank rows = not tested)  UPPER EXTREMITY MMT:  MMT Right eval Left eval  Shoulder flexion 4 4-  Shoulder extension    Shoulder abduction 4 4-  Shoulder adduction    Shoulder internal rotation 4 4  Shoulder external rotation 4 4  Middle trapezius    Lower trapezius (tested in seated position resisting against scapular retraction) 4 4-  Upper trap (tested in seated position during shoulder shrug) 4+ 4+  Elbow flexion    Elbow extension    Wrist flexion    Wrist extension    Wrist ulnar deviation    Wrist radial deviation    Wrist pronation    Wrist supination    Grip strength     (Blank rows = not tested)  CERVICAL SPECIAL TESTS:  N/A will attempt DNF endurance testing next session dependent on pt's tolerance to supine  FUNCTIONAL TESTS:  N/A  TODAY'S TREATMENT: DATE: 08/14/23  Therapeutic  Exercise:  UBE x2.5 minutes forward/ x1.5 minutes backward lvl 3.0  L levator scapulae stretch 2 x30sec Standing shoulder abduction w/ 2# DB in bilat UE's 2 x10 Standing shoulder forward flexion w/ 2# DB in bilat Ue's 3 x10 Shoulder "Y"'s w/ 1#DB RUE/LUE 3x 10 Standing shoulder row w/ Black TB 3 x12 Standing shoulder ext w/ Black TB 3 x12 Standing shoulder flexion wall- ball walkups w/ red physioball x10, x8 w/ 2# AW's on bilat wrists  Standing shoulder shrugs w/ 5# DB in bilat UE 's 2 x10 Shoulder horizontal abduction w/ GTB 2 x12 Crossbody punches w/ 2# DB RUE/LUE 3 x30sec   Manual Therapy:   TTP palpated at L levator scapulae, upper trapezius and peri-scapulars. TP release performed including w/ lacrosse ball. ~5 minutes.   PATIENT EDUCATION:  Education details: HEP given to pt  Person educated: Patient Education method: Medical illustrator Education comprehension: verbalized understanding and returned demonstration  HOME EXERCISE PROGRAM:   Access Code: [email or web address] URL: [email or web address] Date: 07/26/2023 Prepared by: Loralyn Freshwater  Exercises - Seated Cat Cow  - 1 x daily - 7 x weekly - 2 sets - 10 reps - 5 seconds hold - Standing Shoulder Row with Anchored Resistance  - 1 x daily - 7 x weekly - 2 sets - 10 reps - Single Arm Shoulder Extension with Anchored Resistance  - 1 x daily - 7 x weekly - 2 sets - 10 reps   ASSESSMENT:  CLINICAL IMPRESSION:  Session focused on progressing L cervical/ shoulder strength and stretching. Pt notes improvements in L shoulder strength noted w/ increased activity tolerance in bilat shoulders displayed w/ increased resistance band levels and DB #'s used during today's session.Pt notes decreased TTP at the L peri-scapular, upper trapezius and levator scapulae area, and self reports decreased pain levels with activity.  Pt would continue to benefit from skilled PT interventions to address remaining L cervical/  shoulder strength and mobility deficits.   OBJECTIVE IMPAIRMENTS: cardiopulmonary status limiting activity, decreased activity tolerance, decreased mobility, decreased ROM, decreased strength, hypomobility, impaired flexibility, and pain.   ACTIVITY LIMITATIONS: lifting and reach over head  PARTICIPATION LIMITATIONS: community activity  PERSONAL FACTORS: Age and 1 comorbidity: COPD  are also affecting patient's functional outcome.   REHAB POTENTIAL: Good  CLINICAL DECISION MAKING: Stable/uncomplicated  EVALUATION COMPLEXITY: Low   GOALS: Goals reviewed with patient? Yes  SHORT TERM GOALS: Target date: 08/16/23  Pt will be independent with HEP to improve thoracic posture, increase shoulder strength, and decreas pain with functional activities   Baseline: 07/26/23: HEP given to pt Goal status: INITIAL   LONG TERM GOALS: Target date: 09/06/23  Pt will improve FOTO to target score to demonstrate clinically significant improvement in functional mobility.   Baseline: 07/26/23: Administer FOTO at next visit, 07/31/23: 51/63  Goal status: INITIAL  2.   Pt will improve cervical AROM in all planes (particularly lateral flexion) by at least 7 degrees to demonstrate clinically significant improvement with overhead tasks.  Baseline: 07/26/23: Active ROM A/PROM (deg) eval  Flexion 40  Extension 30  Right lateral flexion 20*  Left lateral flexion 30  Right rotation 60*   Left rotation 65   Goal status: INITIAL  3.  Pt will improve DNF endurance test to at least ___ seconds to match age matched norms to demonstrate clinically significant improvement in cervical flexor strength to improve posture. Baseline: 07/26/23: Defer to next session dependent upon pt's tolerance to supine positioning.  Goal status:  INITIAL  PLAN:  PT FREQUENCY: 1-2x/week  PT DURATION: 6 weeks  PLANNED INTERVENTIONS: Therapeutic exercises, Therapeutic activity, Neuromuscular re-education, Balance training, Gait  training, Patient/Family education, Self Care, and Joint mobilization  PLAN FOR NEXT SESSION: Update HEP, Continue to progress L shoulder/ cervical mobility  Lovie Macadamia, SPT  Nolon Bussing, PT, DPT Physical Therapist - [facility]  08/14/23, 3:23 PM

## 2023-08-16 ENCOUNTER — Ambulatory Visit: Payer: Medicare Other | Attending: Family Medicine

## 2023-08-16 DIAGNOSIS — M6281 Muscle weakness (generalized): Secondary | ICD-10-CM | POA: Diagnosis not present

## 2023-08-16 DIAGNOSIS — M542 Cervicalgia: Secondary | ICD-10-CM | POA: Insufficient documentation

## 2023-08-16 NOTE — Therapy (Addendum)
OUTPATIENT PHYSICAL THERAPY CERVICAL TREATMENT   Patient Name: Megan Bradshaw MRN: [id number] DOB:1930-06-09, 89 y.o., female Today's Date: 08/16/2023  END OF SESSION:  PT End of Session - 08/16/23 1353     Visit Number 6    Number of Visits 13    Date for PT Re-Evaluation 09/06/23    PT Start Time 1352    PT Stop Time 1420    PT Time Calculation (min) 28 min    Activity Tolerance Patient tolerated treatment well             Past Medical History:  Diagnosis Date   Actinic keratosis    Arthritis    Asthma    COPD (chronic obstructive pulmonary disease) (HCC)    Diabetes (HCC)    Thyroid disease    Past Surgical History:  Procedure Laterality Date   ABDOMINAL HYSTERECTOMY  1984   benign, TAH.   APPENDECTOMY  1939    CATARACT EXTRACTION, BILATERAL     CHOLECYSTECTOMY  1987   NASAL SINUS SURGERY  1990   REPLACEMENT TOTAL KNEE Right 2007   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) 09/22/2014   Hypothyroid 09/22/2014   COPD mixed type (HCC) 08/12/2014    PCP: Judy Pimple, MD  REFERRING PROVIDER: Excell Seltzer, MD  REFERRING DIAG:  M54.2 (ICD-10-CM) - Neck pain on left side    THERAPY DIAG:  Cervicalgia  Muscle weakness (generalized)  Rationale for Evaluation and Treatment: Rehabilitation  ONSET DATE: Couple of months  SUBJECTIVE:                                                                                                                                                                                                          SUBJECTIVE STATEMENT: Pt reports no L shoulder/ neck pain. No notable changes since last session.   Hand dominance: Right  PERTINENT HISTORY:  Pt reports pain in the L side of her neck and shoulder region that has been sustained over the past several months. She believes she may have "pulled a  muscle" at her water aerobics class when lifting 1# weights overhead. Using a heating pack has helped some. Pain is a 1-2/ 10 NPS at rest and the worst pain of 3-4/10 NPS with mobility. Pt reports constant aching pain in the L shoulder area. Pain primarily on the L side of neck with overhead movements. Denies radicular symptoms. Pain located along L levator scapulae region. Pt prescribed Voltarin that has not helped reduce the pain. No changes in pain throughout a 24 hour period. PAIN:  Are you having pain? Yes: NPRS scale: 2/10 Pain location: L levator scapulae pain Pain description: Achey, dull  Aggravating factors: Overhead movements Relieving factors: Heating pad   PRECAUTIONS: None  RED FLAGS: None and Bowel or bladder incontinence: No     WEIGHT BEARING RESTRICTIONS: No  FALLS:  Has patient fallen in last 6 months? Yes. Number of falls 1 Tripped over garbage can, fell in the garage two months ago   OCCUPATION: Retired   PLOF: Independent  PATIENT GOALS: Get the pain feeling a little better.   NEXT MD VISIT: N/A  OBJECTIVE:   DIAGNOSTIC FINDINGS:   DG Cervical Spine Complete  on 06/07/2023 (per file) EXAM: CERVICAL SPINE - COMPLETE 4+ VIEW   COMPARISON:  Cervical spine radiographs 10/19/2015   FINDINGS: The cervical spine is imaged through the C6 vertebral body in the lateral projection. There is slight reversal of the normal cervical lordosis centered at C4 with trace retrolisthesis of C4 on C5 and C5 on C6, similar to the prior study. Vertebral body heights  are preserved, without evidence of acute injury.   There is moderate disc space narrowing at C4-C5 through C6-C7 with mild associated degenerative endplate change. There is overall mild facet arthropathy without evidence of high-grade osseous neural foraminal stenosis.   The prevertebral soft tissues are unremarkable. There are coarsened interstitial markings throughout the imaged lungs. There is no focal airspace opacity.  IMPRESSION: 1. No acute finding in the cervical spine. 2. Overall mild for age degenerative changes as above with disc space narrowing and degenerative endplate change at C4-C5 through C6-C7.    PATIENT SURVEYS:  FOTO 51/63  COGNITION: Overall cognitive status: Within functional limits for tasks assessed  SENSATION: WFL  POSTURE: rounded shoulders and increased thoracic kyphosis  PALPATION: TTP at the L levator scapulae insertion, and L peri-scapular musculature.  CERVICAL ROM:   Active ROM A/PROM (deg) eval  Flexion 40  Extension 30  Right lateral flexion 20*  Left lateral flexion 30  Right rotation 60*   Left rotation 65   (Blank rows = not tested)  UPPER EXTREMITY MMT:  MMT Right eval Left eval  Shoulder flexion 4 4-  Shoulder extension    Shoulder abduction 4 4-  Shoulder adduction    Shoulder internal rotation 4 4  Shoulder external rotation 4 4  Middle trapezius    Lower trapezius (tested in seated position resisting against scapular retraction) 4 4-  Upper trap (tested in seated position during shoulder shrug) 4+ 4+  Elbow flexion    Elbow extension    Wrist flexion    Wrist extension    Wrist ulnar deviation    Wrist radial deviation    Wrist pronation    Wrist supination    Grip strength     (Blank rows = not tested)  CERVICAL SPECIAL TESTS:  N/A will attempt DNF endurance testing next session dependent on pt's tolerance to supine  FUNCTIONAL TESTS:  N/A  TODAY'S TREATMENT: DATE: 08/16/23  Therapeutic  Exercise:  UBE x 3 min forward lvl 4.0  L levator scapulae stretch 2 x30sec Standing shoulder abduction w/ 2# DB in bilat UE's 2 x12 Shoulder "Y"'s w/ 1#DB RUE/LUE 3x 10 Crossbody punches w/ 2# DB RUE/LUE 3 x30sec  Manual Therapy:   TTP palpated at L levator scapulae, upper trapezius and peri-scapulars. TP release performed including w/ lacrosse ball. ~5 minutes. Not billed.   PATIENT EDUCATION:  Education details: HEP given to pt  Person educated: Patient Education method: Medical illustrator Education comprehension: verbalized understanding and returned demonstration  HOME EXERCISE PROGRAM:   Access Code: [email or web address] URL: [email or web address] Date: 07/26/2023 Prepared by: Loralyn Freshwater  Exercises - Seated Cat Cow  - 1 x daily - 7 x weekly - 2 sets - 10 reps - 5 seconds hold - Standing Shoulder Row with Anchored Resistance  - 1 x daily - 7 x weekly - 2 sets - 10 reps - Single Arm Shoulder Extension with Anchored Resistance  - 1 x daily - 7 x weekly - 2 sets - 10 reps   ASSESSMENT:  CLINICAL IMPRESSION:  Pt arriving late for appt today and requesting to leave early. Session focused on progressing L cervical/ shoulder strength and stretching. Pt notes improvements in L shoulder pain  noted w/ increased activity tolerance with L shoulder exercises. Pt continues to report mild TTP at the L peri-scapular, upper trapezius and levator scapulae area relieved with lacrosse ball STM. Session ended early 2/2 to asthma flare up. Pt would continue to benefit from skilled PT interventions to address remaining L cervical/ shoulder strength and mobility deficits.   OBJECTIVE IMPAIRMENTS: cardiopulmonary status limiting activity, decreased activity tolerance, decreased mobility, decreased ROM, decreased strength, hypomobility, impaired flexibility, and pain.   ACTIVITY LIMITATIONS: lifting and reach over head  PARTICIPATION LIMITATIONS: community activity  PERSONAL  FACTORS: Age and 1 comorbidity: COPD  are also affecting patient's functional outcome.   REHAB POTENTIAL: Good  CLINICAL DECISION MAKING: Stable/uncomplicated  EVALUATION COMPLEXITY: Low   GOALS: Goals reviewed with patient? Yes  SHORT TERM GOALS: Target date: 08/16/23  Pt will be independent with HEP to improve thoracic posture, increase shoulder strength, and decreas pain with functional activities   Baseline: 07/26/23: HEP given to pt Goal status: INITIAL   LONG TERM GOALS: Target date: 09/06/23  Pt will improve FOTO to target score to demonstrate clinically significant improvement in functional mobility.   Baseline: 07/26/23: Administer FOTO at next visit, 07/31/23: 51/63  Goal status: INITIAL  2.   Pt will improve cervical AROM in all planes (particularly lateral flexion) by at least 7 degrees to demonstrate clinically significant improvement with overhead tasks.  Baseline: 07/26/23: Active ROM A/PROM (deg) eval  Flexion 40  Extension 30  Right lateral flexion 20*  Left lateral flexion 30  Right rotation 60*   Left rotation 65   Goal status: INITIAL  3.  Pt will improve DNF endurance test to at least ___ seconds to match age matched norms to demonstrate clinically significant improvement in cervical flexor strength to improve posture. Baseline: 07/26/23: Defer to next session dependent upon pt's tolerance to supine positioning.  Goal status: INITIAL  PLAN:  PT FREQUENCY: 1-2x/week  PT DURATION: 6 weeks  PLANNED INTERVENTIONS: Therapeutic exercises, Therapeutic activity, Neuromuscular re-education, Balance training, Gait training, Patient/Family education, Self Care, and Joint mobilization  PLAN FOR NEXT SESSION: Update HEP potential d/c, Continue to progress L shoulder/ cervical mobility  Lovie Macadamia, SPT  Delphia Grates. Fairly IV, PT,  DPT Physical Therapist- [facility]  08/16/23, 3:46 PM

## 2023-08-22 ENCOUNTER — Telehealth: Payer: Self-pay

## 2023-08-22 ENCOUNTER — Ambulatory Visit: Payer: Medicare Other

## 2023-08-22 NOTE — Telephone Encounter (Signed)
Called pt about missed appt. Pt expressed she had another appt and had canceled this appt previously. Pt confirmed her next appt.

## 2023-08-24 ENCOUNTER — Ambulatory Visit: Payer: Medicare Other

## 2023-08-24 DIAGNOSIS — M6281 Muscle weakness (generalized): Secondary | ICD-10-CM

## 2023-08-24 DIAGNOSIS — M542 Cervicalgia: Secondary | ICD-10-CM | POA: Diagnosis not present

## 2023-08-24 NOTE — Therapy (Addendum)
OUTPATIENT PHYSICAL THERAPY CERVICAL TREATMENT   Patient Name: Megan Bradshaw MRN: [id number] DOB:1930/07/30, 89 y.o., female Today's Date: 08/24/2023  END OF SESSION:  PT End of Session - 08/24/23 1256     Visit Number 7    Number of Visits 13    Date for PT Re-Evaluation 09/06/23    PT Start Time 1300    PT Stop Time 1343    PT Time Calculation (min) 43 min    Activity Tolerance Patient tolerated treatment well             Past Medical History:  Diagnosis Date   Actinic keratosis    Arthritis    Asthma    COPD (chronic obstructive pulmonary disease) (HCC)    Diabetes (HCC)    Thyroid disease    Past Surgical History:  Procedure Laterality Date   ABDOMINAL HYSTERECTOMY  1984   benign, TAH.   APPENDECTOMY  1939    CATARACT EXTRACTION, BILATERAL     CHOLECYSTECTOMY  1987   NASAL SINUS SURGERY  1990   REPLACEMENT TOTAL KNEE Right 2007   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) 09/22/2014   Hypothyroid 09/22/2014   COPD mixed type (HCC) 08/12/2014    PCP: Judy Pimple, MD  REFERRING PROVIDER: Excell Seltzer, MD  REFERRING DIAG:  M54.2 (ICD-10-CM) - Neck pain on left side    THERAPY DIAG:  Cervicalgia  Muscle weakness (generalized)  Rationale for Evaluation and Treatment: Rehabilitation  ONSET DATE: Couple of months  SUBJECTIVE:                                                                                                                                                                                                          SUBJECTIVE STATEMENT: Pt reports mild L shoulder/ neck pain. Along w/ difficulty with pt's asthma flaring up today. No notable changes since last session.   Hand dominance: Right  PERTINENT HISTORY:  Pt reports pain in the L side of her neck and shoulder region that has been sustained over the past  several months. She believes she may have "pulled a muscle" at her water aerobics class when lifting 1# weights overhead. Using a heating pack has helped some. Pain is a 1-2/ 10 NPS at rest and the worst pain of 3-4/10 NPS with mobility. Pt reports constant aching pain in the L shoulder area. Pain primarily on the L side of neck with overhead movements. Denies radicular symptoms. Pain located along L levator scapulae region. Pt prescribed Voltarin that has not helped reduce the pain. No changes in pain throughout a 24 hour period. PAIN:  Are you having pain? Yes: NPRS scale: 2/10 Pain location: L levator scapulae pain Pain description: Achey, dull  Aggravating factors: Overhead movements Relieving factors: Heating pad   PRECAUTIONS: None  RED FLAGS: None and Bowel or bladder incontinence: No     WEIGHT BEARING RESTRICTIONS: No  FALLS:  Has patient fallen in last 6 months? Yes. Number of falls 1 Tripped over garbage can, fell in the garage two months ago   OCCUPATION: Retired   PLOF: Independent  PATIENT GOALS: Get the pain feeling a little better.   NEXT MD VISIT: N/A  OBJECTIVE:   DIAGNOSTIC FINDINGS:   DG Cervical Spine Complete  on 06/07/2023 (per file) EXAM: CERVICAL SPINE - COMPLETE 4+ VIEW   COMPARISON:  Cervical spine radiographs 10/19/2015   FINDINGS: The cervical spine is imaged through the C6 vertebral body in the lateral projection. There is slight reversal of the normal cervical lordosis centered at C4 with trace retrolisthesis of C4 on C5 and C5 on C6,  similar to the prior study. Vertebral body heights are preserved, without evidence of acute injury.   There is moderate disc space narrowing at C4-C5 through C6-C7 with mild associated degenerative endplate change. There is overall mild facet arthropathy without evidence of high-grade osseous neural foraminal stenosis.   The prevertebral soft tissues are unremarkable. There are coarsened interstitial markings throughout the imaged lungs. There is no focal airspace opacity.  IMPRESSION: 1. No acute finding in the cervical spine. 2. Overall mild for age degenerative changes as above with disc space narrowing and degenerative endplate change at C4-C5 through C6-C7.    PATIENT SURVEYS:  FOTO 51/63  COGNITION: Overall cognitive status: Within functional limits for tasks assessed  SENSATION: WFL  POSTURE: rounded shoulders and increased thoracic kyphosis  PALPATION: TTP at the L levator scapulae insertion, and L peri-scapular musculature.  CERVICAL ROM:   Active ROM A/PROM (deg) eval AROM (Deg)  10/10  Flexion 40 45  Extension 30 38  Right lateral flexion 20* 25  Left lateral flexion 30 35  Right rotation 60*  65  Left rotation 65 63   (Blank rows = not tested)  UPPER EXTREMITY MMT:  MMT Right eval Left eval  Shoulder flexion 4 4-  Shoulder extension    Shoulder abduction 4 4-  Shoulder adduction    Shoulder internal rotation 4 4  Shoulder external rotation 4 4  Middle trapezius    Lower trapezius (tested in seated position resisting against scapular retraction) 4 4-  Upper trap (tested in seated position during shoulder shrug) 4+ 4+  Elbow flexion    Elbow extension    Wrist flexion    Wrist extension    Wrist ulnar deviation    Wrist radial deviation    Wrist pronation    Wrist supination    Grip strength     (Blank rows = not tested)  CERVICAL SPECIAL TESTS:  N/A will attempt DNF endurance  testing next session dependent on pt's tolerance to  supine  FUNCTIONAL TESTS:  N/A  TODAY'S TREATMENT: DATE: 08/24/23   Beginning of session spent reassessing pt's STG and LTG ( see above).  Therapeutic Exercise:  UBE x 2 min forward x 2 min backward lvl 3.0 L levator scapulae stretch 2 x30sec Standing shoulder horizontal abduction w/ GTB 3 x10 Standing shoulder abduction w/ 2# DB LUE and 1# DB in RUE d/t supply 2 x10 Seated shoulder "Y"'s w/ 2#DB in LUE 1#DB d/t supply in RUE 3x 10 Seated bicep curls w/ 1# DB in LUE, 2# DB d/t supply in RUE 3 x10  Manual Therapy:   TTP palpated at L levator scapulae, upper trapezius and peri-scapulars. TP release performed including w/ lacrosse ball. ~4 minutes. Not billed.   PATIENT EDUCATION:  Education details: HEP given to pt  Person educated: Patient Education method: Medical illustrator Education comprehension: verbalized understanding and returned demonstration  HOME EXERCISE PROGRAM:   Access Code: [email or web address] URL: [email or web address] Date: 07/26/2023 Prepared by: Loralyn Freshwater  Exercises - Seated Cat Cow  - 1 x daily - 7 x weekly - 2 sets - 10 reps - 5 seconds hold - Standing Shoulder Row with Anchored Resistance  - 1 x daily - 7 x weekly - 2 sets - 10 reps - Single Arm Shoulder Extension with Anchored Resistance  - 1 x daily - 7 x weekly - 2 sets - 10 reps   ASSESSMENT:  CLINICAL IMPRESSION: Session focused on reassessing pt's STG and LTG's and progressing L cervical/ shoulder strength and stretching. Pt's goals were reassessed to determine pt's progression in PT and if pt was appropriate for d/c. Pt notes achieving her STG of being HEP compliant, and is progressing well towards her LTG of cervical AROM improvements. Pt continues to note mild pain and tightness in the L shoulder. Pt and PT agree that pt would benefit from continued skilled PT interventions 1x/ week for an additional 4 weeks to address remaining cervical AROM, strength and pain deficits.    OBJECTIVE IMPAIRMENTS: cardiopulmonary status limiting activity, decreased activity tolerance, decreased mobility, decreased ROM, decreased strength, hypomobility, impaired flexibility, and pain.   ACTIVITY LIMITATIONS: lifting and reach over head  PARTICIPATION LIMITATIONS: community activity  PERSONAL FACTORS: Age and 1 comorbidity: COPD  are also affecting patient's functional outcome.   REHAB POTENTIAL: Good  CLINICAL DECISION MAKING: Stable/uncomplicated  EVALUATION COMPLEXITY: Low   GOALS: Goals reviewed with patient? Yes  SHORT TERM GOALS: Target date: 08/16/23  Pt will be independent with HEP to improve thoracic posture, increase shoulder strength, and decrease pain with functional activities   Baseline: 07/26/23: HEP given to pt Goal status: MET   LONG TERM GOALS: Target date: 09/06/23  Pt will improve FOTO to target score to demonstrate clinically significant improvement in functional mobility.   Baseline: 07/26/23: Administer FOTO at next visit, 07/31/23: 51/63 08/24/23: 56/63  Goal status: ONGOING  2.   Pt will improve cervical AROM in all planes (particularly lateral flexion) by at least 7 degrees to demonstrate clinically significant improvement with overhead tasks.  Baseline: Active ROM A/PROM (deg) eval AROM (Deg)  10/10  Flexion 40 45  Extension 30 38  Right lateral flexion 20* 25  Left lateral flexion 30 35  Right rotation 60*  65  Left rotation 65 63   Goal status: ONGOING   PLAN:  PT FREQUENCY: 1-2x/week  PT DURATION: 6 weeks  PLANNED INTERVENTIONS: Therapeutic exercises, Therapeutic activity, Neuromuscular re-education, Balance training,  Gait training, Patient/Family education, Self Care, and Joint mobilization  PLAN FOR NEXT SESSION: Update HEP, Continue to progress L shoulder/ cervical mobility  Lovie Macadamia, SPT  Delphia Grates. Fairly IV, PT, DPT Physical Therapist- [place]  [facility]  08/24/23, 4:21  PM

## 2023-08-29 ENCOUNTER — Ambulatory Visit: Payer: Medicare Other

## 2023-08-31 ENCOUNTER — Ambulatory Visit: Payer: Medicare Other

## 2023-08-31 DIAGNOSIS — M542 Cervicalgia: Secondary | ICD-10-CM | POA: Diagnosis not present

## 2023-08-31 DIAGNOSIS — M6281 Muscle weakness (generalized): Secondary | ICD-10-CM

## 2023-08-31 NOTE — Therapy (Addendum)
OUTPATIENT PHYSICAL THERAPY CERVICAL TREATMENT   Patient Name: Megan Bradshaw MRN: [id number] DOB:10-16-1930, 89 y.o., female Today's Date: 08/31/2023  END OF SESSION:  PT End of Session - 08/31/23 1113     Visit Number 8    Number of Visits 13    Date for PT Re-Evaluation 09/06/23    PT Start Time 1114    PT Stop Time 1157    PT Time Calculation (min) 43 min    Activity Tolerance Patient tolerated treatment well             Past Medical History:  Diagnosis Date   Actinic keratosis    Arthritis    Asthma    COPD (chronic obstructive pulmonary disease) (HCC)    Diabetes (HCC)    Thyroid disease    Past Surgical History:  Procedure Laterality Date   ABDOMINAL HYSTERECTOMY  1984   benign, TAH.   APPENDECTOMY  1939    CATARACT EXTRACTION, BILATERAL     CHOLECYSTECTOMY  1987   NASAL SINUS SURGERY  1990   REPLACEMENT TOTAL KNEE Right 2007   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) 09/22/2014   Hypothyroid 09/22/2014   COPD mixed type (HCC) 08/12/2014    PCP: Judy Pimple, MD  REFERRING PROVIDER: Excell Seltzer, MD  REFERRING DIAG:  M54.2 (ICD-10-CM) - Neck pain on left side    THERAPY DIAG:  Cervicalgia  Muscle weakness (generalized)  Rationale for Evaluation and Treatment: Rehabilitation  ONSET DATE: Couple of months  SUBJECTIVE:                                                                                                                                                                                                          SUBJECTIVE STATEMENT: Pt reports 1/10 NPRS L shoulder/ neck pain. No notable changes since last session.   Hand dominance: Right  PERTINENT HISTORY:  Pt reports pain in the L side of her neck and shoulder region that has been sustained over the past several months. She believes she may have "pulled  a muscle" at her water aerobics class when lifting 1# weights overhead. Using a heating pack has helped some. Pain is a 1-2/ 10 NPS at rest and the worst pain of 3-4/10 NPS with mobility. Pt reports constant aching pain in the L shoulder area. Pain primarily on the L side of neck with overhead movements. Denies radicular symptoms. Pain located along L levator scapulae region. Pt prescribed Voltarin that has not helped reduce the pain. No changes in pain throughout a 24 hour period. PAIN:  Are you having pain? Yes: NPRS scale: 2/10 Pain location: L levator scapulae pain Pain description: Achey, dull  Aggravating factors: Overhead movements Relieving factors: Heating pad   PRECAUTIONS: None  RED FLAGS: None and Bowel or bladder incontinence: No     WEIGHT BEARING RESTRICTIONS: No  FALLS:  Has patient fallen in last 6 months? Yes. Number of falls 1 Tripped over garbage can, fell in the garage two months ago   OCCUPATION: Retired   PLOF: Independent  PATIENT GOALS: Get the pain feeling a little better.   NEXT MD VISIT: N/A  OBJECTIVE:   DIAGNOSTIC FINDINGS:   DG Cervical Spine Complete  on 06/07/2023 (per file) EXAM: CERVICAL SPINE - COMPLETE 4+ VIEW   COMPARISON:  Cervical spine radiographs 10/19/2015   FINDINGS: The cervical spine is imaged through the C6 vertebral body in the lateral projection. There is slight reversal of the normal cervical lordosis centered at C4 with trace retrolisthesis of C4 on C5 and C5 on C6, similar to the prior study. Vertebral body heights  are preserved, without evidence of acute injury.   There is moderate disc space narrowing at C4-C5 through C6-C7 with mild associated degenerative endplate change. There is overall mild facet arthropathy without evidence of high-grade osseous neural foraminal stenosis.   The prevertebral soft tissues are unremarkable. There are coarsened interstitial markings throughout the imaged lungs. There is no focal airspace opacity.  IMPRESSION: 1. No acute finding in the cervical spine. 2. Overall mild for age degenerative changes as above with disc space narrowing and degenerative endplate change at C4-C5 through C6-C7.    PATIENT SURVEYS:  FOTO 51/63  COGNITION: Overall cognitive status: Within functional limits for tasks assessed  SENSATION: WFL  POSTURE: rounded shoulders and increased thoracic kyphosis  PALPATION: TTP at the L levator scapulae insertion, and L peri-scapular musculature.  CERVICAL ROM:   Active ROM A/PROM (deg) eval AROM (Deg)  10/10  Flexion 40 45  Extension 30 38  Right lateral flexion 20* 25  Left lateral flexion 30 35  Right rotation 60*  65  Left rotation 65 63   (Blank rows = not tested)  UPPER EXTREMITY MMT:  MMT Right eval Left eval  Shoulder flexion 4 4-  Shoulder extension    Shoulder abduction 4 4-  Shoulder adduction    Shoulder internal rotation 4 4  Shoulder external rotation 4 4  Middle trapezius    Lower trapezius (tested in seated position resisting against scapular retraction) 4 4-  Upper trap (tested in seated position during shoulder shrug) 4+ 4+  Elbow flexion    Elbow extension    Wrist flexion    Wrist extension    Wrist ulnar deviation    Wrist radial deviation    Wrist pronation    Wrist supination    Grip strength     (Blank rows = not tested)  CERVICAL SPECIAL TESTS:  N/A will attempt DNF endurance testing next session dependent on pt's tolerance to  supine  FUNCTIONAL TESTS:  N/A  TODAY'S  TREATMENT: DATE: 08/31/23   Therapeutic Exercise:  UBE x 2 min forward x 2 min backward lvl 3.0 L levator scapulae stretch 2 x30sec Standing shoulder shrugs w/ 3# DB in bilat UE 's 3 x10  Standing shoulder wall angels w/ 1# DB to 90deg in bilat UE 's 3 x10  Standing shoulder rows w/ blue TB 2 x12 Standing shoulder extensions w/ blue TB 2 x12  Standing shoulder horizontal abduction w/ Blue TB 3 x12 Standing shoulder flexion w/ 2# DB in bilat Ue's 3 x10/ each side Seated bicep curls w/ 2# DB in bilat UE 's 3 x10  Standing tricep extension w/ GTB Rue/ Lue 2 x12/ each side  Manual Therapy:   TTP palpated at L levator scapulae, upper trapezius and peri-scapulars. TP release performed including w/ lacrosse ball. ~5 minutes. Not billed.   PATIENT EDUCATION:  Education details: HEP given to pt  Person educated: Patient Education method: Medical illustrator Education comprehension: verbalized understanding and returned demonstration  HOME EXERCISE PROGRAM:   Access Code: [email or web address] URL: [email or web address] Date: 07/26/2023 Prepared by: Loralyn Freshwater  Exercises - Seated Cat Cow  - 1 x daily - 7 x weekly - 2 sets - 10 reps - 5 seconds hold - Standing Shoulder Row with Anchored Resistance  - 1 x daily - 7 x weekly - 2 sets - 10 reps - Single Arm Shoulder Extension with Anchored Resistance  - 1 x daily - 7 x weekly - 2 sets - 10 reps   ASSESSMENT:  CLINICAL IMPRESSION: Session focused on progressing bilat shoulder and cervical strengthening and mobility. Pt continues to note improvements in L shoulder strength noted with increased activity tolerance to exercises at today's session. Pt continues to have fear avoidance behavior involving bilat UE overhead movements. Future sessions will continue to attempt overhead movements with pt. Pt notes TTP at L levator scapulae noting decreased tightness following STM with lacrosse ball. Pt would continue to benefit from  skilled PT interventions to address remaining cervical AROM, strength and pain deficits.   OBJECTIVE IMPAIRMENTS: cardiopulmonary status limiting activity, decreased activity tolerance, decreased mobility, decreased ROM, decreased strength, hypomobility, impaired flexibility, and pain.   ACTIVITY LIMITATIONS: lifting and reach over head  PARTICIPATION LIMITATIONS: community activity  PERSONAL FACTORS: Age and 1 comorbidity: COPD  are also affecting patient's functional outcome.   REHAB POTENTIAL: Good  CLINICAL DECISION MAKING: Stable/uncomplicated  EVALUATION COMPLEXITY: Low   GOALS: Goals reviewed with patient? Yes  SHORT TERM GOALS: Target date: 08/16/23  Pt will be independent with HEP to improve thoracic posture, increase shoulder strength, and decrease pain with functional activities   Baseline: 07/26/23: HEP given to pt Goal status: MET   LONG TERM GOALS: Target date: 09/06/23  Pt will improve FOTO to target score to demonstrate clinically significant improvement in functional mobility.   Baseline: 07/26/23: Administer FOTO at next visit, 07/31/23: 51/63 08/24/23: 56/63  Goal status: ONGOING  2.   Pt will improve cervical AROM in all planes (particularly lateral flexion) by at least 7 degrees to demonstrate clinically significant improvement with overhead tasks.  Baseline: Active ROM A/PROM (deg) eval AROM (Deg)  10/10  Flexion 40 45  Extension 30 38  Right lateral flexion 20* 25  Left lateral flexion 30 35  Right rotation 60*  65  Left rotation 65 63   Goal status: ONGOING   PLAN:  PT FREQUENCY: 1-2x/week  PT DURATION: 6 weeks  PLANNED INTERVENTIONS:  Therapeutic exercises, Therapeutic activity, Neuromuscular re-education, Balance training, Gait training, Patient/Family education, Self Care, and Joint mobilization  PLAN FOR NEXT SESSION: Update HEP, RECERT  Lovie Macadamia, SPT  Delphia Grates. Fairly IV, PT, DPT Physical Therapist- [place]  [facility]  08/31/23, 1:34 PM

## 2023-09-08 ENCOUNTER — Encounter: Payer: Self-pay | Admitting: Family Medicine

## 2023-09-08 ENCOUNTER — Ambulatory Visit (INDEPENDENT_AMBULATORY_CARE_PROVIDER_SITE_OTHER): Payer: Medicare Other | Admitting: Family Medicine

## 2023-09-08 VITALS — BP 120/74 | HR 81 | Temp 98.8°F | Ht 62.5 in | Wt 181.2 lb

## 2023-09-08 DIAGNOSIS — L03116 Cellulitis of left lower limb: Secondary | ICD-10-CM | POA: Diagnosis not present

## 2023-09-08 MED ORDER — CEPHALEXIN 500 MG PO CAPS: 500.0000 mg | ORAL_CAPSULE | Freq: Three times a day (TID) | ORAL | 0 refills | Status: DC

## 2023-09-08 NOTE — Progress Notes (Signed)
Patient ID: Megan Bradshaw, female    DOB: 1929-11-23, 89 y.o.   MRN: [id number]  This visit was conducted in person.  BP 120/74 (BP Location: Left Arm, Patient Position: Sitting, Cuff Size: Large)   Pulse 81   Temp 98.8 F (37.1 C) (Temporal)   Ht 5' 2.5" (1.588 m)   Wt 181 lb 4 oz (82.2 kg)   SpO2 94%   BMI 32.62 kg/m    CC:  Chief Complaint  Patient presents with   Skin Lesion    Left Lower Leg    Subjective:   HPI: Megan Bradshaw is a 89 y.o. female presenting on 09/08/2023 for Skin Lesion (Left Lower Leg)  New onset skin lesion below knee,  noted in last 1.5 weeks.  Area is sore , not itchy.  No discharge.   Feels well, no flu like symptoms.  No fever.   No known injury.  No known exposure to MRSA. No known bite.        Relevant past medical, surgical, family and social history reviewed and updated as indicated. Interim medical history since our last visit reviewed. Allergies and medications reviewed and updated. Outpatient Medications Prior to Visit  Medication Sig Dispense Refill   ACCU-CHEK GUIDE test strip USE TO TEST TWICE DAILY 200 strip 1   Accu-Chek Softclix Lancets lancets Use to check blood sugar 1 times a day 100 each 3   albuterol (PROVENTIL) (2.5 MG/3ML) 0.083% nebulizer solution Take 3 mLs (2.5 mg total) by nebulization every 4 (four) hours as needed for wheezing or shortness of breath. 150 mL 5   albuterol (VENTOLIN HFA) 108 (90 Base) MCG/ACT inhaler INHALE 1 TO 2 PUFFS BY MOUTH EVERY 6 HOURS FOR PAIN OR WHEEZING OR SHORTNESS OF BREATH 18 g 2   B Complex-Folic Acid (BENFOTIAMINE MULTI-B) CAPS Take 1 capsule by mouth daily. 30 capsule 6   Blood Glucose Monitoring Suppl (ACCU-CHEK GUIDE ME) w/Device KIT See admin instructions.     Cholecalciferol (VITAMIN D PO) Take 1 tablet by mouth daily.     clobetasol cream (TEMOVATE) 0.05 % Apply 1 Application topically daily as needed. 30 g 1   fluticasone (FLONASE) 50 MCG/ACT nasal spray Place 2 sprays  into both nostrils daily. 16 g 2   fluticasone furoate-vilanterol (BREO ELLIPTA) 100-25 MCG/ACT AEPB Inhale 1 puff into the lungs daily. 180 each 2   gabapentin (NEURONTIN) 100 MG capsule TAKE 1 CAPSULE(100 MG) BY MOUTH THREE TIMES DAILY 180 capsule 3   glipiZIDE (GLUCOTROL XL) 10 MG 24 hr tablet TAKE 1 TABLET(10 MG) BY MOUTH DAILY WITH BREAKFAST 90 tablet 1   ipratropium-albuterol (DUONEB) 0.5-2.5 (3) MG/3ML SOLN USE 3 ML VIA NEBULIZER EVERY 6 HOURS AS NEEDED 360 mL 1   ketoconazole (NIZORAL) 2 % cream Apply 1 Application topically daily. 15 g 0   levothyroxine (SYNTHROID) 75 MCG tablet TAKE 1 TABLET(75 MCG) BY MOUTH DAILY BEFORE BREAKFAST 90 tablet 3   metFORMIN (GLUCOPHAGE) 500 MG tablet TAKE 1 TABLET(500 MG) BY MOUTH TWICE DAILY WITH A MEAL 180 tablet 3   montelukast (SINGULAIR) 10 MG tablet TAKE 1 TABLET(10 MG) BY MOUTH AT BEDTIME 30 tablet 11   Multiple Vitamin (MULTIVITAMIN) capsule Take 1 capsule by mouth daily.     nystatin cream (MYCOSTATIN) Apply 1 Application topically 2 (two) times daily. 30 g 5   predniSONE (DELTASONE) 5 MG tablet TAKE 1 TABLET(5 MG) BY MOUTH DAILY WITH BREAKFAST 90 tablet 1   No facility-administered medications prior to visit.  Per HPI unless specifically indicated in ROS section below Review of Systems  Constitutional:  Negative for fatigue and fever.  HENT:  Negative for congestion.   Eyes:  Negative for pain.  Respiratory:  Negative for cough and shortness of breath.   Cardiovascular:  Negative for chest pain, palpitations and leg swelling.  Gastrointestinal:  Negative for abdominal pain.  Genitourinary:  Negative for dysuria and vaginal bleeding.  Musculoskeletal:  Negative for back pain.  Neurological:  Negative for syncope, light-headedness and headaches.  Psychiatric/Behavioral:  Negative for dysphoric mood.    Objective:  BP 120/74 (BP Location: Left Arm, Patient Position: Sitting, Cuff Size: Large)   Pulse 81   Temp 98.8 F (37.1 C)  (Temporal)   Ht 5' 2.5" (1.588 m)   Wt 181 lb 4 oz (82.2 kg)   SpO2 94%   BMI 32.62 kg/m   Wt Readings from Last 3 Encounters:  09/08/23 181 lb 4 oz (82.2 kg)  07/27/23 182 lb (82.6 kg)  06/07/23 181 lb (82.1 kg)      Physical Exam Constitutional:      General: She is not in acute distress.    Appearance: Normal appearance. She is well-developed. She is not ill-appearing or toxic-appearing.  HENT:     Head: Normocephalic.     Right Ear: Hearing, tympanic membrane, ear canal and external ear normal. Tympanic membrane is not erythematous, retracted or bulging.     Left Ear: Hearing, tympanic membrane, ear canal and external ear normal. Tympanic membrane is not erythematous, retracted or bulging.     Nose: No mucosal edema or rhinorrhea.     Right Sinus: No maxillary sinus tenderness or frontal sinus tenderness.     Left Sinus: No maxillary sinus tenderness or frontal sinus tenderness.     Mouth/Throat:     Mouth: Oropharynx is clear and moist and mucous membranes are normal.     Pharynx: Uvula midline.  Eyes:     General: Lids are normal. Lids are everted, no foreign bodies appreciated.     Extraocular Movements: EOM normal.     Conjunctiva/sclera: Conjunctivae normal.     Pupils: Pupils are equal, round, and reactive to light.  Neck:     Thyroid: No thyroid mass or thyromegaly.     Vascular: No carotid bruit.     Trachea: Trachea normal.  Cardiovascular:     Rate and Rhythm: Normal rate and regular rhythm.     Pulses: Normal pulses.     Heart sounds: Normal heart sounds, S1 normal and S2 normal. No murmur heard.    No friction rub. No gallop.  Pulmonary:     Effort: Pulmonary effort is normal. No tachypnea or respiratory distress.     Breath sounds: Normal breath sounds. No decreased breath sounds, wheezing, rhonchi or rales.  Abdominal:     General: Bowel sounds are normal.     Palpations: Abdomen is soft.     Tenderness: There is no abdominal tenderness.   Musculoskeletal:     Cervical back: Normal range of motion and neck supple.  Skin:    General: Skin is warm, dry and intact.     Findings: Lesion present. No rash.     Comments:  Left lower leg.Marland Kitchen  1.5 cm  rainsed flaky lesion, no central fluctuance with surrounding erythema, ttp.  Neurological:     Mental Status: She is alert.  Psychiatric:        Mood and Affect: Mood is not anxious or depressed.  Speech: Speech normal.        Behavior: Behavior normal. Behavior is cooperative.        Thought Content: Thought content normal.        Cognition and Memory: Cognition and memory normal.        Judgment: Judgment normal.       Results for orders placed or performed in visit on 05/16/23  Urine Culture   Specimen: Urine  Result Value Ref Range   MICRO NUMBER: [id number]    SPECIMEN QUALITY: Adequate    Sample Source URINE, CLEAN CATCH    STATUS: FINAL    ISOLATE 1: Escherichia coli (A)       Susceptibility   Escherichia coli - URINE CULTURE, REFLEX    AMOX/CLAVULANIC >=32 Resistant     AMPICILLIN >=32 Resistant     AMPICILLIN/SULBACTAM >=32 Resistant     CEFAZOLIN* 8 Resistant      * For uncomplicated UTI caused by E. coli, K. pneumoniae or P. mirabilis: Cefazolin is susceptible if MIC <32 mcg/mL and predicts susceptible to the oral agents cefaclor, cefdinir, cefpodoxime, cefprozil, cefuroxime, cephalexin and loracarbef.     CEFTAZIDIME <=1 Sensitive     CEFEPIME <=1 Sensitive     CEFTRIAXONE <=1 Sensitive     CIPROFLOXACIN <=0.25 Sensitive     LEVOFLOXACIN <=0.12 Sensitive     GENTAMICIN <=1 Sensitive     IMIPENEM <=0.25 Sensitive     NITROFURANTOIN <=16 Sensitive     PIP/TAZO 8 Sensitive     TOBRAMYCIN <=1 Sensitive     TRIMETH/SULFA* <=20 Sensitive      * For uncomplicated UTI caused by E. coli, K. pneumoniae or P. mirabilis: Cefazolin is susceptible if MIC <32 mcg/mL and predicts susceptible to the oral agents cefaclor, cefdinir, cefpodoxime, cefprozil,  cefuroxime, cephalexin and loracarbef. Legend: S = Susceptible  I = Intermediate R = Resistant  NS = Not susceptible * = Not tested  NR = Not reported **NN = See antimicrobic comments   POCT Urinalysis Dipstick (Automated)  Result Value Ref Range   Color, UA Yellow    Clarity, UA Clear    Glucose, UA Negative Negative   Bilirubin, UA Negative    Ketones, UA Negative    Spec Grav, UA 1.015 1.010 - 1.025   Blood, UA Trace    pH, UA 5.5 5.0 - 8.0   Protein, UA Negative Negative   Urobilinogen, UA 0.2 0.2 or 1.0 E.U./dL   Nitrite, UA Postitive    Leukocytes, UA Moderate (2+) (A) Negative    Assessment and Plan  Cellulitis of left lower extremity Assessment & Plan: Acute, unclear if current infected chronic skin lesion 9 she does feel that there was no skin lesion there before this last week) versus dry flaky changes on acute lesion.  Recommend warm compresses and will treat with Keflex 3 times daily 500 mg x 7 days. She will call with update if not improving as expected.  Recommended calling sooner if redness spreading or new fever.   Other orders -     Cephalexin; Take 1 capsule (500 mg total) by mouth 3 (three) times daily.  Dispense: 21 capsule; Refill: 0    No follow-ups on file.   Kerby Nora, MD

## 2023-09-08 NOTE — Patient Instructions (Signed)
Start warm compresses left lower leg lesion 2-3 times daily as needed. Apply topical antibiotic ointment such as Neosporin and cover with Band-Aid. Complete course of Keflex 500 mg p.o. 3 times daily x 7 days. Call if fever, redness spreading or pain increasing.

## 2023-09-08 NOTE — Assessment & Plan Note (Signed)
Acute, unclear if current infected chronic skin lesion 9 she does feel that there was no skin lesion there before this last week) versus dry flaky changes on acute lesion.  Recommend warm compresses and will treat with Keflex 3 times daily 500 mg x 7 days. She will call with update if not improving as expected.  Recommended calling sooner if redness spreading or new fever.

## 2023-09-14 ENCOUNTER — Ambulatory Visit: Payer: Medicare Other

## 2023-09-14 DIAGNOSIS — M6281 Muscle weakness (generalized): Secondary | ICD-10-CM | POA: Diagnosis not present

## 2023-09-14 DIAGNOSIS — M542 Cervicalgia: Secondary | ICD-10-CM | POA: Diagnosis not present

## 2023-09-14 NOTE — Therapy (Addendum)
OUTPATIENT PHYSICAL THERAPY CERVICAL TREATMENT/ RE-CERTIFICATION   Patient Name: Megan Bradshaw MRN: [id number] DOB:Jun 16, 1930, 89 y.o., female Today's Date: 09/14/2023  END OF SESSION:  PT End of Session - 09/14/23 1114     Visit Number 9    Number of Visits 13    Date for PT Re-Evaluation 10/12/23    PT Start Time 1114    PT Stop Time 1156    PT Time Calculation (min) 42 min    Activity Tolerance Patient tolerated treatment well             Past Medical History:  Diagnosis Date   Actinic keratosis    Arthritis    Asthma    COPD (chronic obstructive pulmonary disease) (HCC)    Diabetes (HCC)    Thyroid disease    Past Surgical History:  Procedure Laterality Date   ABDOMINAL HYSTERECTOMY  1984   benign, TAH.   APPENDECTOMY  1939    CATARACT EXTRACTION, BILATERAL     CHOLECYSTECTOMY  1987   NASAL SINUS SURGERY  1990   REPLACEMENT TOTAL KNEE Right 2007   Patient Active Problem List   Diagnosis Date Noted   Foot injury, left, initial encounter 07/11/2023   Cellulitis of left lower extremity 07/11/2023   Candidal intertrigo 05/16/2023   Cheilitis 01/19/2023   Pre-ulcerative calluses 01/19/2023   Vertigo 12/13/2022   DOE (dyspnea on exertion) 03/03/2022   Left lower quadrant abdominal pain 03/03/2022   Neuropathy due to type 2 diabetes mellitus (HCC) 03/26/2021   Arthritis 12/07/2017   COPD exacerbation (HCC) 05/18/2017   OAB (overactive bladder) 09/01/2016   Seasonal allergies 09/01/2016   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) 09/22/2014   Hypothyroid 09/22/2014   COPD mixed type (HCC) 08/12/2014    PCP: Judy Pimple, MD  REFERRING PROVIDER: Excell Seltzer, MD  REFERRING DIAG:  M54.2 (ICD-10-CM) - Neck pain on left side    THERAPY DIAG:  Cervicalgia  Muscle weakness (generalized)  Rationale for Evaluation and Treatment: Rehabilitation  ONSET DATE: Couple of months  SUBJECTIVE:                                                                                                                                                                                                          SUBJECTIVE STATEMENT: Pt reports 3-4/10 NPRS L shoulder/ neck pain. No notable changes since last session.   Hand dominance: Right  PERTINENT HISTORY:  Pt reports pain in the L side of her neck and shoulder region that has been sustained over the  past several months. She believes she may have "pulled a muscle" at her water aerobics class when lifting 1# weights overhead. Using a heating pack has helped some. Pain is a 1-2/ 10 NPS at rest and the worst pain of 3-4/10 NPS with mobility. Pt reports constant aching pain in the L shoulder area. Pain primarily on the L side of neck with overhead movements. Denies radicular symptoms. Pain located along L levator scapulae region. Pt prescribed Voltarin that has not helped reduce the pain. No changes in pain throughout a 24 hour period. PAIN:  Are you having pain? Yes: NPRS scale: 3-4/10 Pain location: L levator scapulae pain Pain description: Achey, dull  Aggravating factors: Overhead movements Relieving factors: Heating pad   PRECAUTIONS: None  RED FLAGS: None and Bowel or bladder incontinence: No     WEIGHT BEARING RESTRICTIONS: No  FALLS:  Has patient fallen in last 6 months? Yes. Number of falls 1 Tripped over garbage can, fell in the garage two months ago   OCCUPATION: Retired   PLOF: Independent  PATIENT GOALS: Get the pain feeling a little better.   NEXT MD VISIT: N/A  OBJECTIVE:   DIAGNOSTIC FINDINGS:   DG Cervical Spine Complete  on 06/07/2023 (per file) EXAM: CERVICAL SPINE - COMPLETE 4+ VIEW   COMPARISON:  Cervical spine radiographs 10/19/2015   FINDINGS: The cervical spine is imaged through the C6 vertebral body in the lateral projection. There is slight reversal of the normal cervical lordosis centered at C4 with trace retrolisthesis of C4  on C5 and C5 on C6, similar to the prior study. Vertebral body heights are preserved, without evidence of acute injury.   There is moderate disc space narrowing at C4-C5 through C6-C7 with mild associated degenerative endplate change. There is overall mild facet arthropathy without evidence of high-grade osseous neural foraminal stenosis.   The prevertebral soft tissues are unremarkable. There are coarsened interstitial markings throughout the imaged lungs. There is no focal airspace opacity.  IMPRESSION: 1. No acute finding in the cervical spine. 2. Overall mild for age degenerative changes as above with disc space narrowing and degenerative endplate change at C4-C5 through C6-C7.    PATIENT SURVEYS:  FOTO 51/63  COGNITION: Overall cognitive status: Within functional limits for tasks assessed  SENSATION: WFL  POSTURE: rounded shoulders and increased thoracic kyphosis  PALPATION: TTP at the L levator scapulae insertion, and L peri-scapular musculature.  CERVICAL ROM:   Active ROM A/PROM (deg) eval AROM (Deg)  10/10  Flexion 40 45  Extension 30 38  Right lateral flexion 20* 25  Left lateral flexion 30 35  Right rotation 60*  65  Left rotation 65 63   (Blank rows = not tested)  UPPER EXTREMITY MMT:  MMT Right eval Left eval  Shoulder flexion 4 4-  Shoulder extension    Shoulder abduction 4 4-  Shoulder adduction    Shoulder internal rotation 4 4  Shoulder external rotation 4 4  Middle trapezius    Lower trapezius (tested in seated position resisting against scapular retraction) 4 4-  Upper trap (tested in seated position during shoulder shrug) 4+ 4+  Elbow flexion    Elbow extension    Wrist flexion    Wrist extension    Wrist ulnar deviation    Wrist radial deviation    Wrist pronation    Wrist supination    Grip strength     (Blank rows = not tested)  CERVICAL SPECIAL TESTS:  N/A will attempt DNF  endurance testing next session dependent on  pt's tolerance to supine  FUNCTIONAL TESTS:  N/A  TODAY'S TREATMENT: DATE: 09/14/23    Beginning of session spent reassessing pt's goals and POC to complete re-cert. (See below)   Therapeutic Exercise:  UBE x 2 min forward x 2 min backward lvl 3.0 Standing L shoulder flexion w/ GTB 2 x10 Standing L shoulder tricep extension w/ BTB 2 x10   Manual Therapy:    TTP palpated at L levator scapulae, upper trapezius and peri-scapulars. TP release performed including w/ lacrosse ball. ~5 minutes. Not billed.  PATIENT EDUCATION:  Education details: HEP given to pt  Person educated: Patient Education method: Medical illustrator Education comprehension: verbalized understanding and returned demonstration  HOME EXERCISE PROGRAM:   Access Code: [email or web address] URL: [email or web address] Date: 07/26/2023 Prepared by: Loralyn Freshwater  Exercises - Seated Cat Cow  - 1 x daily - 7 x weekly - 2 sets - 10 reps - 5 seconds hold - Standing Shoulder Row with Anchored Resistance  - 1 x daily - 7 x weekly - 2 sets - 10 reps - Single Arm Shoulder Extension with Anchored Resistance  - 1 x daily - 7 x weekly - 2 sets - 10 reps   ASSESSMENT:  CLINICAL IMPRESSION: Session focused on reassessing pt's STG and LTG's as pt requiring re-certification at today's session to continue skilled PT interventions. Pt achieved 1/3 of her goals including being HEP compliant. Pt notes progression in her FOTO score in comparison to her score at her first visit. Pt notes improvements in her cervical flexion and extension AROM, however has declined in her R/L lateral cervical flexion and R/L cervical rotation 2/2 to increased cervical/ shoulder tightness at today's session. Pt's HEP reviewed and updated, to improve L shoulder strength and cervical mobility. Pt verbalized and demonstrated understanding. Pt would continue to benefit from skilled PT interventions to address remaining cervical AROM, strength and  pain deficits  to improve QoL and return to PLOF.   OBJECTIVE IMPAIRMENTS: cardiopulmonary status limiting activity, decreased activity tolerance, decreased mobility, decreased ROM, decreased strength, hypomobility, impaired flexibility, and pain.   ACTIVITY LIMITATIONS: lifting and reach over head  PARTICIPATION LIMITATIONS: community activity  PERSONAL FACTORS: Age and 1 comorbidity: COPD  are also affecting patient's functional outcome.   REHAB POTENTIAL: Good  CLINICAL DECISION MAKING: Stable/uncomplicated  EVALUATION COMPLEXITY: Low   GOALS: Goals reviewed with patient? Yes  SHORT TERM GOALS: Target date: 08/16/23  Pt will be independent with HEP to improve thoracic posture, increase shoulder strength, and decrease pain with functional activities   Baseline: 07/26/23: HEP given to pt Goal status: MET   LONG TERM GOALS: Target date: 10/12/23  Pt will improve FOTO to target score to demonstrate clinically significant improvement in functional mobility.   Baseline: 07/26/23: Administer FOTO at next visit, 07/31/23: 51/63 08/24/23: 56/63 09/14/23: 53/63 Goal status: ONGOING  2.   Pt will improve cervical AROM in all planes (particularly lateral flexion) by at least 7 degrees to demonstrate clinically significant improvement with overhead tasks.  Baseline: Active ROM A/PROM (deg) eval AROM (Deg)  10/10 AROM (deg) 10/31  Flexion 40 45 50  Extension 30 38 40  Right lateral flexion 20* 25 25  Left lateral flexion 30 35 25  Right rotation 60*  65 38  Left rotation 65 63 33   Goal status: ONGOING   PLAN:  PT FREQUENCY: 1-2x/week  PT DURATION: 6 weeks  PLANNED INTERVENTIONS: Therapeutic exercises, Therapeutic activity, Neuromuscular re-education, Balance  training, Gait training, Patient/Family education, Self Care, and Joint mobilization  PLAN FOR NEXT SESSION: Continue to progress L shoulder and cervical AROM and strengthening exercises.  Lovie Macadamia,  SPT  Delphia Grates. Fairly IV, PT, DPT Physical Therapist- [place]  [facility]  09/14/23, 2:59 PM

## 2023-09-19 ENCOUNTER — Ambulatory Visit: Payer: Medicare Other

## 2023-09-22 ENCOUNTER — Other Ambulatory Visit: Payer: Self-pay | Admitting: Family Medicine

## 2023-09-22 ENCOUNTER — Ambulatory Visit: Payer: Medicare Other | Attending: Family Medicine

## 2023-09-22 DIAGNOSIS — M6281 Muscle weakness (generalized): Secondary | ICD-10-CM | POA: Diagnosis not present

## 2023-09-22 DIAGNOSIS — M542 Cervicalgia: Secondary | ICD-10-CM | POA: Diagnosis not present

## 2023-09-22 NOTE — Therapy (Signed)
OUTPATIENT PHYSICAL THERAPY CERVICAL TREATMENT/ PROGRESS NOTE  Dates of Reporting Period: 07/26/23 - 09/22/23   Patient Name: Megan Bradshaw MRN: [id number] DOB:1930-03-07, 89 y.o., female Today's Date: 09/22/2023  END OF SESSION:  PT End of Session - 09/22/23 1110     Visit Number 10    Number of Visits 13    Date for PT Re-Evaluation 10/12/23    PT Start Time 1115    PT Stop Time 1159    PT Time Calculation (min) 44 min    Activity Tolerance Patient tolerated treatment well             Past Medical History:  Diagnosis Date   Actinic keratosis    Arthritis    Asthma    COPD (chronic obstructive pulmonary disease) (HCC)    Diabetes (HCC)    Thyroid disease    Past Surgical History:  Procedure Laterality Date   ABDOMINAL HYSTERECTOMY  1984   benign, TAH.   APPENDECTOMY  1939    CATARACT EXTRACTION, BILATERAL     CHOLECYSTECTOMY  1987   NASAL SINUS SURGERY  1990   REPLACEMENT TOTAL KNEE Right 2007   Patient Active Problem List   Diagnosis Date Noted   Foot injury, left, initial encounter 07/11/2023   Cellulitis of left lower extremity 07/11/2023   Candidal intertrigo 05/16/2023   Cheilitis 01/19/2023   Pre-ulcerative calluses 01/19/2023   Vertigo 12/13/2022   DOE (dyspnea on exertion) 03/03/2022   Left lower quadrant abdominal pain 03/03/2022   Neuropathy due to type 2 diabetes mellitus (HCC) 03/26/2021   Arthritis 12/07/2017   COPD exacerbation (HCC) 05/18/2017   OAB (overactive bladder) 09/01/2016   Seasonal allergies 09/01/2016   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) 09/22/2014   Hypothyroid 09/22/2014   COPD mixed type (HCC) 08/12/2014    PCP: Judy Pimple, MD  REFERRING PROVIDER: Excell Seltzer, MD  REFERRING DIAG:  M54.2 (ICD-10-CM) - Neck pain on left side    THERAPY DIAG:  Cervicalgia  Muscle weakness (generalized)  Rationale for Evaluation and Treatment: Rehabilitation  ONSET DATE: Couple of  months  SUBJECTIVE:                                                                                                                                                                                                         SUBJECTIVE STATEMENT: Pt reports very mild neck pain but more stiffness than pain. No notable changes since last session.   Hand dominance: Right  PERTINENT HISTORY:  Pt reports pain in the L side  of her neck and shoulder region that has been sustained over the past several months. She believes she may have "pulled a muscle" at her water aerobics class when lifting 1# weights overhead. Using a heating pack has helped some. Pain is a 1-2/ 10 NPS at rest and the worst pain of 3-4/10 NPS with mobility. Pt reports constant aching pain in the L shoulder area. Pain primarily on the L side of neck with overhead movements. Denies radicular symptoms. Pain located along L levator scapulae region. Pt prescribed Voltarin that has not helped reduce the pain. No changes in pain throughout a 24 hour period. PAIN:  Are you having pain? Yes: NPRS scale: 1/10 Pain location: L levator scapulae pain Pain description: Achey, dull  Aggravating factors: Overhead movements Relieving factors: Heating pad   PRECAUTIONS: None  RED FLAGS: None and Bowel or bladder incontinence: No     WEIGHT BEARING RESTRICTIONS: No  FALLS:  Has patient fallen in last 6 months? Yes. Number of falls 1 Tripped over garbage can, fell in the garage two months ago   OCCUPATION: Retired   PLOF: Independent  PATIENT GOALS: Get the pain feeling a little better.   NEXT MD VISIT: N/A  OBJECTIVE:   DIAGNOSTIC FINDINGS:   DG Cervical Spine Complete  on 06/07/2023 (per file) EXAM: CERVICAL SPINE - COMPLETE 4+ VIEW   COMPARISON:  Cervical spine radiographs 10/19/2015   FINDINGS: The cervical spine is imaged through the C6 vertebral body in the lateral projection. There is slight reversal of the normal  cervical lordosis centered at C4 with trace retrolisthesis of C4 on C5 and C5 on C6, similar to the prior study. Vertebral body heights are preserved, without evidence of acute injury.   There is moderate disc space narrowing at C4-C5 through C6-C7 with mild associated degenerative endplate change. There is overall mild facet arthropathy without evidence of high-grade osseous neural foraminal stenosis.   The prevertebral soft tissues are unremarkable. There are coarsened interstitial markings throughout the imaged lungs. There is no focal airspace opacity.  IMPRESSION: 1. No acute finding in the cervical spine. 2. Overall mild for age degenerative changes as above with disc space narrowing and degenerative endplate change at C4-C5 through C6-C7.    PATIENT SURVEYS:  FOTO 51/63  COGNITION: Overall cognitive status: Within functional limits for tasks assessed  SENSATION: WFL  POSTURE: rounded shoulders and increased thoracic kyphosis  PALPATION: TTP at the L levator scapulae insertion, and L peri-scapular musculature.  CERVICAL ROM:   Active ROM A/PROM (deg) eval AROM (Deg)  10/10  Flexion 40 45  Extension 30 38  Right lateral flexion 20* 25  Left lateral flexion 30 35  Right rotation 60*  65  Left rotation 65 63   (Blank rows = not tested)  UPPER EXTREMITY MMT:  MMT Right eval Left eval  Shoulder flexion 4 4-  Shoulder extension    Shoulder abduction 4 4-  Shoulder adduction    Shoulder internal rotation 4 4  Shoulder external rotation 4 4  Middle trapezius    Lower trapezius (tested in seated position resisting against scapular retraction) 4 4-  Upper trap (tested in seated position during shoulder shrug) 4+ 4+  Elbow flexion    Elbow extension    Wrist flexion    Wrist extension    Wrist ulnar deviation    Wrist radial deviation    Wrist pronation    Wrist supination    Grip strength     (Blank rows =  not tested)  CERVICAL SPECIAL TESTS:   N/A will attempt DNF endurance testing next session dependent on pt's tolerance to supine  FUNCTIONAL TESTS:  N/A  TODAY'S TREATMENT: DATE: 09/22/23   Patient requiring progress note. See prior session as goals just re-assessed for PT re-certification.   Therapeutic Exercise:  FOTO:  52/63  UBE x 2 min forward x 2 min backward lvl 4.0 L levator scapulae stretch 2 x30sec. VC's for hand placement with fair carryover.  Standing shoulder shrugs w/ 4# DB in bilat UE 's to scapular depression, 3x8 Standing shoulder wall angels w/ 1# DB to 90deg in bilat UE 's 3x12 Seated cervical retractions into green physioball. Mod multimodal cuing for form/technique. 3x12 Standing shoulder rows w/ blue TB 2x12 Standing shoulder extensions w/ black TB 2x12 Standing shoulder flexion w/ 2# DB in bilat UE's 3x10/ each side  Seated alternating punches for serratus anterior strength and pec major: 3x10/side, 2# DB's   Application of biofreeze to L levator scap and upper trap for pain modulation. Unbilled.  PATIENT EDUCATION:  Education details: HEP given to pt  Person educated: Patient Education method: Medical illustrator Education comprehension: verbalized understanding and returned demonstration  HOME EXERCISE PROGRAM:   Access Code: [email or web address] URL: [email or web address] Date: 07/26/2023 Prepared by: Loralyn Freshwater  Exercises - Seated Cat Cow  - 1 x daily - 7 x weekly - 2 sets - 10 reps - 5 seconds hold - Standing Shoulder Row with Anchored Resistance  - 1 x daily - 7 x weekly - 2 sets - 10 reps - Single Arm Shoulder Extension with Anchored Resistance  - 1 x daily - 7 x weekly - 2 sets - 10 reps   ASSESSMENT:  CLINICAL IMPRESSION: Pt on 10th visit requiring progress note. Please see prior session as pt just required re-cert for updated goals. Pt progressed in cervical and UE/periscap strengthening. Pt tolerated strengthen progression well but continues to require min  to mod multimodal cuing for form/technique with good carryover. Pt would continue to benefit from skilled PT interventions to address remaining cervical AROM, strength and pain deficits  to improve QoL and return to PLOF.   OBJECTIVE IMPAIRMENTS: cardiopulmonary status limiting activity, decreased activity tolerance, decreased mobility, decreased ROM, decreased strength, hypomobility, impaired flexibility, and pain.   ACTIVITY LIMITATIONS: lifting and reach over head  PARTICIPATION LIMITATIONS: community activity  PERSONAL FACTORS: Age and 1 comorbidity: COPD  are also affecting patient's functional outcome.   REHAB POTENTIAL: Good  CLINICAL DECISION MAKING: Stable/uncomplicated  EVALUATION COMPLEXITY: Low   GOALS: Goals reviewed with patient? Yes  SHORT TERM GOALS: Target date: 08/16/23  Pt will be independent with HEP to improve thoracic posture, increase shoulder strength, and decrease pain with functional activities   Baseline: 07/26/23: HEP given to pt Goal status: MET   LONG TERM GOALS: Target date: 10/12/23  Pt will improve FOTO to target score to demonstrate clinically significant improvement in functional mobility.   Baseline: 07/26/23: Administer FOTO at next visit, 07/31/23: 51/63 08/24/23: 56/63 09/14/23: 53/63 Goal status: ONGOING  2.   Pt will improve cervical AROM in all planes (particularly lateral flexion) by at least 7 degrees to demonstrate clinically significant improvement with overhead tasks.  Baseline: Active ROM A/PROM (deg) eval AROM (Deg)  10/10 AROM (deg) 10/31  Flexion 40 45 50  Extension 30 38 40  Right lateral flexion 20* 25 25  Left lateral flexion 30 35 25  Right rotation 60*  65 38  Left rotation 65 63  33   Goal status: ONGOING   PLAN:  PT FREQUENCY: 1-2x/week  PT DURATION: 6 weeks  PLANNED INTERVENTIONS: Therapeutic exercises, Therapeutic activity, Neuromuscular re-education, Balance training, Gait training, Patient/Family  education, Self Care, and Joint mobilization  PLAN FOR NEXT SESSION: Continue to progress L shoulder and cervical AROM and strengthening exercises.  Lovie Macadamia, SPT  Delphia Grates. Fairly IV, PT, DPT Physical Therapist- [facility]  09/22/23, 12:07 PM

## 2023-09-22 NOTE — Telephone Encounter (Signed)
Spoke to pt, scheduled ov for 09/27/23 @ 9am

## 2023-09-22 NOTE — Telephone Encounter (Signed)
Please call and schedule diabetes follow up with Dr. Ermalene Searing.

## 2023-09-22 NOTE — Telephone Encounter (Signed)
Tried calling patient, no answer and phone just rung out.

## 2023-09-25 ENCOUNTER — Ambulatory Visit: Payer: Medicare Other

## 2023-09-25 DIAGNOSIS — M6281 Muscle weakness (generalized): Secondary | ICD-10-CM

## 2023-09-25 DIAGNOSIS — M542 Cervicalgia: Secondary | ICD-10-CM

## 2023-09-25 NOTE — Therapy (Signed)
OUTPATIENT PHYSICAL THERAPY CERVICAL TREATMENT  Patient Name: Megan Bradshaw MRN: [id number] DOB:1930/03/06, 89 y.o., female Today's Date: 09/25/2023  END OF SESSION:  PT End of Session - 09/25/23 1437     Visit Number 11    Number of Visits 13    Date for PT Re-Evaluation 10/12/23    PT Start Time 1431    PT Stop Time 1515    PT Time Calculation (min) 44 min    Activity Tolerance Patient tolerated treatment well             Past Medical History:  Diagnosis Date   Actinic keratosis    Arthritis    Asthma    COPD (chronic obstructive pulmonary disease) (HCC)    Diabetes (HCC)    Thyroid disease    Past Surgical History:  Procedure Laterality Date   ABDOMINAL HYSTERECTOMY  1984   benign, TAH.   APPENDECTOMY  1939    CATARACT EXTRACTION, BILATERAL     CHOLECYSTECTOMY  1987   NASAL SINUS SURGERY  1990   REPLACEMENT TOTAL KNEE Right 2007   Patient Active Problem List   Diagnosis Date Noted   Foot injury, left, initial encounter 07/11/2023   Cellulitis of left lower extremity 07/11/2023   Candidal intertrigo 05/16/2023   Cheilitis 01/19/2023   Pre-ulcerative calluses 01/19/2023   Vertigo 12/13/2022   DOE (dyspnea on exertion) 03/03/2022   Left lower quadrant abdominal pain 03/03/2022   Neuropathy due to type 2 diabetes mellitus (HCC) 03/26/2021   Arthritis 12/07/2017   COPD exacerbation (HCC) 05/18/2017   OAB (overactive bladder) 09/01/2016   Seasonal allergies 09/01/2016   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) 09/22/2014   Hypothyroid 09/22/2014   COPD mixed type (HCC) 08/12/2014    PCP: Judy Pimple, MD  REFERRING PROVIDER: Excell Seltzer, MD  REFERRING DIAG:  M54.2 (ICD-10-CM) - Neck pain on left side    THERAPY DIAG:  Cervicalgia  Muscle weakness (generalized)  Rationale for Evaluation and Treatment: Rehabilitation  ONSET DATE: Couple of months  SUBJECTIVE:                                                                                                                                                                                                          SUBJECTIVE STATEMENT: Pt reports mild neck pain. 4/10 NPS today. Reports stiffness. Hand dominance: Right  PERTINENT HISTORY:  Pt reports pain in the L side of her neck and shoulder region that has been sustained over the past several months. She believes she may have "  pulled a muscle" at her water aerobics class when lifting 1# weights overhead. Using a heating pack has helped some. Pain is a 1-2/ 10 NPS at rest and the worst pain of 3-4/10 NPS with mobility. Pt reports constant aching pain in the L shoulder area. Pain primarily on the L side of neck with overhead movements. Denies radicular symptoms. Pain located along L levator scapulae region. Pt prescribed Voltarin that has not helped reduce the pain. No changes in pain throughout a 24 hour period. PAIN:  Are you having pain? Yes: NPRS scale: 4/10 Pain location: L levator scapulae pain Pain description: Achey, dull  Aggravating factors: Overhead movements Relieving factors: Heating pad   PRECAUTIONS: None  RED FLAGS: None and Bowel or bladder incontinence: No     WEIGHT BEARING RESTRICTIONS: No  FALLS:  Has patient fallen in last 6 months? Yes. Number of falls 1 Tripped over garbage can, fell in the garage two months ago   OCCUPATION: Retired   PLOF: Independent  PATIENT GOALS: Get the pain feeling a little better.   NEXT MD VISIT: N/A  OBJECTIVE:   DIAGNOSTIC FINDINGS:   DG Cervical Spine Complete  on 06/07/2023 (per file) EXAM: CERVICAL SPINE - COMPLETE 4+ VIEW   COMPARISON:  Cervical spine radiographs 10/19/2015   FINDINGS: The cervical spine is imaged through the C6 vertebral body in the lateral projection. There is slight reversal of the normal cervical lordosis centered at C4 with trace retrolisthesis of C4 on C5 and C5 on C6, similar to the prior study.  Vertebral body heights are preserved, without evidence of acute injury.   There is moderate disc space narrowing at C4-C5 through C6-C7 with mild associated degenerative endplate change. There is overall mild facet arthropathy without evidence of high-grade osseous neural foraminal stenosis.   The prevertebral soft tissues are unremarkable. There are coarsened interstitial markings throughout the imaged lungs. There is no focal airspace opacity.  IMPRESSION: 1. No acute finding in the cervical spine. 2. Overall mild for age degenerative changes as above with disc space narrowing and degenerative endplate change at C4-C5 through C6-C7.    PATIENT SURVEYS:  FOTO 51/63  COGNITION: Overall cognitive status: Within functional limits for tasks assessed  SENSATION: WFL  POSTURE: rounded shoulders and increased thoracic kyphosis  PALPATION: TTP at the L levator scapulae insertion, and L peri-scapular musculature.  CERVICAL ROM:   Active ROM A/PROM (deg) eval AROM (Deg)  10/10  Flexion 40 45  Extension 30 38  Right lateral flexion 20* 25  Left lateral flexion 30 35  Right rotation 60*  65  Left rotation 65 63   (Blank rows = not tested)  UPPER EXTREMITY MMT:  MMT Right eval Left eval  Shoulder flexion 4 4-  Shoulder extension    Shoulder abduction 4 4-  Shoulder adduction    Shoulder internal rotation 4 4  Shoulder external rotation 4 4  Middle trapezius    Lower trapezius (tested in seated position resisting against scapular retraction) 4 4-  Upper trap (tested in seated position during shoulder shrug) 4+ 4+  Elbow flexion    Elbow extension    Wrist flexion    Wrist extension    Wrist ulnar deviation    Wrist radial deviation    Wrist pronation    Wrist supination    Grip strength     (Blank rows = not tested)  CERVICAL SPECIAL TESTS:  N/A will attempt DNF endurance testing next session dependent on pt's tolerance  to supine  FUNCTIONAL TESTS:   N/A  TODAY'S TREATMENT: DATE: 09/25/23   Therapeutic Exercise:  UBE x 2 min forward x 2 min backward lvl 4.0 Standing shoulder shrugs w/ 4# DB in bilat UE's to scapular depression. 1x12, 2x8 Standing shoulder wall angels w/ 2# DB to 90deg in bilat UE 's 3x8 Seated cervical retractions into green physioball. Mod multimodal cuing for form/technique. 3x12 Standing thoracic extension on blue bolster: 2x12, arms across chest  Standing D2 flexion BUE with 1 KG med ball, x12/direction.   Application of biofreeze to L levator scap and upper trap for pain modulation. Unbilled.  Manual Therapy: 10 min seated for pain modulation  L cervical paraspinal/levatorscap/upper trap STM.   PATIENT EDUCATION:  Education details: HEP given to pt  Person educated: Patient Education method: Medical illustrator Education comprehension: verbalized understanding and returned demonstration  HOME EXERCISE PROGRAM:   Access Code: [email or web address] URL: [email or web address] Date: 07/26/2023 Prepared by: Loralyn Freshwater  Exercises - Seated Cat Cow  - 1 x daily - 7 x weekly - 2 sets - 10 reps - 5 seconds hold - Standing Shoulder Row with Anchored Resistance  - 1 x daily - 7 x weekly - 2 sets - 10 reps - Single Arm Shoulder Extension with Anchored Resistance  - 1 x daily - 7 x weekly - 2 sets - 10 reps   ASSESSMENT:  CLINICAL IMPRESSION: Continuing PT POC progressing cervical mobility, deep neck flexor strength, and periscapular strength. Pt with improved carryover with progressions today but still reliant on moderate cuing for deep neck flexor cervical retraction exercises. Pt with notable trigger points in upper trap with concordant pain. STM techniques applied to improve tissue extensibility with success. Pt reports pain decreased post session to 2/10 NPS. Pt would continue to benefit from skilled PT interventions to address remaining cervical AROM, strength and pain deficits  to improve  QoL and return to PLOF.    OBJECTIVE IMPAIRMENTS: cardiopulmonary status limiting activity, decreased activity tolerance, decreased mobility, decreased ROM, decreased strength, hypomobility, impaired flexibility, and pain.   ACTIVITY LIMITATIONS: lifting and reach over head  PARTICIPATION LIMITATIONS: community activity  PERSONAL FACTORS: Age and 1 comorbidity: COPD  are also affecting patient's functional outcome.   REHAB POTENTIAL: Good  CLINICAL DECISION MAKING: Stable/uncomplicated  EVALUATION COMPLEXITY: Low   GOALS: Goals reviewed with patient? Yes  SHORT TERM GOALS: Target date: 08/16/23  Pt will be independent with HEP to improve thoracic posture, increase shoulder strength, and decrease pain with functional activities   Baseline: 07/26/23: HEP given to pt Goal status: MET   LONG TERM GOALS: Target date: 10/12/23  Pt will improve FOTO to target score to demonstrate clinically significant improvement in functional mobility.   Baseline: 07/26/23: Administer FOTO at next visit, 07/31/23: 51/63 08/24/23: 56/63 09/14/23: 53/63 Goal status: ONGOING  2.   Pt will improve cervical AROM in all planes (particularly lateral flexion) by at least 7 degrees to demonstrate clinically significant improvement with overhead tasks.  Baseline: Active ROM A/PROM (deg) eval AROM (Deg)  10/10 AROM (deg) 10/31  Flexion 40 45 50  Extension 30 38 40  Right lateral flexion 20* 25 25  Left lateral flexion 30 35 25  Right rotation 60*  65 38  Left rotation 65 63 33   Goal status: ONGOING   PLAN:  PT FREQUENCY: 1-2x/week  PT DURATION: 6 weeks  PLANNED INTERVENTIONS: Therapeutic exercises, Therapeutic activity, Neuromuscular re-education, Balance training, Gait training, Patient/Family education, Self Care, and Joint mobilization  PLAN FOR NEXT SESSION: Continue to progress L shoulder and cervical AROM and strengthening exercises.  Delphia Grates. Fairly IV, PT, DPT Physical Therapist-  [place]  [facility]  09/25/23, 3:15 PM

## 2023-09-27 ENCOUNTER — Encounter: Payer: Self-pay | Admitting: Family Medicine

## 2023-09-27 ENCOUNTER — Ambulatory Visit (INDEPENDENT_AMBULATORY_CARE_PROVIDER_SITE_OTHER): Payer: Medicare Other | Admitting: Family Medicine

## 2023-09-27 ENCOUNTER — Telehealth: Payer: Self-pay | Admitting: Family Medicine

## 2023-09-27 VITALS — BP 120/84 | HR 75 | Temp 97.8°F | Ht 62.5 in | Wt 181.0 lb

## 2023-09-27 DIAGNOSIS — T380X5D Adverse effect of glucocorticoids and synthetic analogues, subsequent encounter: Secondary | ICD-10-CM | POA: Diagnosis not present

## 2023-09-27 DIAGNOSIS — E099 Drug or chemical induced diabetes mellitus without complications: Secondary | ICD-10-CM | POA: Diagnosis not present

## 2023-09-27 DIAGNOSIS — J449 Chronic obstructive pulmonary disease, unspecified: Secondary | ICD-10-CM | POA: Diagnosis not present

## 2023-09-27 DIAGNOSIS — Z7984 Long term (current) use of oral hypoglycemic drugs: Secondary | ICD-10-CM

## 2023-09-27 DIAGNOSIS — E1140 Type 2 diabetes mellitus with diabetic neuropathy, unspecified: Secondary | ICD-10-CM

## 2023-09-27 DIAGNOSIS — L989 Disorder of the skin and subcutaneous tissue, unspecified: Secondary | ICD-10-CM

## 2023-09-27 LAB — POCT GLYCOSYLATED HEMOGLOBIN (HGB A1C): Hemoglobin A1C: 6.6 % — AB (ref 4.0–5.6)

## 2023-09-27 MED ORDER — FLUTICASONE FUROATE-VILANTEROL 100-25 MCG/ACT IN AEPB: 1.0000 | INHALATION_SPRAY | Freq: Every day | RESPIRATORY_TRACT | 2 refills | Status: DC

## 2023-09-27 MED ORDER — BREO ELLIPTA 100-25 MCG/ACT IN AEPB: 1.0000 | INHALATION_SPRAY | Freq: Every day | RESPIRATORY_TRACT | 3 refills | Status: DC

## 2023-09-27 NOTE — Telephone Encounter (Signed)
Rx for Breo Ellipta 100-25 mg brand name sent to [facility] as instructed by Dr. Ermalene Searing.

## 2023-09-27 NOTE — Assessment & Plan Note (Addendum)
Chronic, tolerable control , continue current medication regimen  metformin 500 mg BID and glipizide xl 10 mg daily  Associated with neuropathy.

## 2023-09-27 NOTE — Telephone Encounter (Signed)
Virgel Bouquet was what was sent in.Virgel Bouquet is fine.. same dosing

## 2023-09-27 NOTE — Assessment & Plan Note (Addendum)
Chronic, associated with diabetes, moderate control   Tolerable on 200 mg -300 mg  gabapentin  po qHS.  Can try trial of ALA  [id number] mg  daily  Preulcerative calluses , has diabetic shoes

## 2023-09-27 NOTE — Progress Notes (Signed)
Patient ID: Megan Bradshaw, female    DOB: 12/06/1929, 89 y.o.   MRN: [id number]  This visit was conducted in person.  BP 120/84 (BP Location: Left Arm, Patient Position: Sitting, Cuff Size: Large)   Pulse 75   Temp 97.8 F (36.6 C) (Oral)   Ht 5' 2.5" (1.588 m)   Wt 181 lb (82.1 kg)   SpO2 95%   BMI 32.58 kg/m    CC:  Chief Complaint  Patient presents with   Diabetes    Subjective:   HPI: Megan Bradshaw is a 89 y.o. female presenting on 09/27/2023 for Diabetes   Diabetes, steroid-induced Tolerable control on metformin 500 mg daily, glipizide XL 10 mg p.o. daily Lab Results  Component Value Date   HGBA1C 6.6 (A) 09/27/2023  Using medications without difficulties: Hypoglycemic episodes: Hyperglycemic episodes: Feet problems: Blood Sugars averaging: eye exam within last year: Associated with neuropathy: Moderate control on gabapentin 400 mg nightly  COPD stable: Followed by pulmonary on albuterol as needed, Breo inhaler and low-dose daily prednisone.        Relevant past medical, surgical, family and social history reviewed and updated as indicated. Interim medical history since our last visit reviewed. Allergies and medications reviewed and updated. Outpatient Medications Prior to Visit  Medication Sig Dispense Refill   ACCU-CHEK GUIDE test strip USE TO TEST TWICE DAILY 200 strip 1   Accu-Chek Softclix Lancets lancets Use to check blood sugar 1 times a day 100 each 3   albuterol (PROVENTIL) (2.5 MG/3ML) 0.083% nebulizer solution Take 3 mLs (2.5 mg total) by nebulization every 4 (four) hours as needed for wheezing or shortness of breath. 150 mL 5   albuterol (VENTOLIN HFA) 108 (90 Base) MCG/ACT inhaler INHALE 1 TO 2 PUFFS BY MOUTH EVERY 6 HOURS FOR PAIN OR WHEEZING OR SHORTNESS OF BREATH 18 g 2   B Complex-Folic Acid (BENFOTIAMINE MULTI-B) CAPS Take 1 capsule by mouth daily. 30 capsule 6   Blood Glucose Monitoring Suppl (ACCU-CHEK GUIDE ME) w/Device KIT See  admin instructions.     Cholecalciferol (VITAMIN D PO) Take 1 tablet by mouth daily.     clobetasol cream (TEMOVATE) 0.05 % Apply 1 Application topically daily as needed. 30 g 1   fluticasone (FLONASE) 50 MCG/ACT nasal spray Place 2 sprays into both nostrils daily. 16 g 2   fluticasone furoate-vilanterol (BREO ELLIPTA) 100-25 MCG/ACT AEPB Inhale 1 puff into the lungs daily. 180 each 2   gabapentin (NEURONTIN) 100 MG capsule TAKE 1 CAPSULE(100 MG) BY MOUTH THREE TIMES DAILY 180 capsule 3   glipiZIDE (GLUCOTROL XL) 10 MG 24 hr tablet TAKE 1 TABLET(10 MG) BY MOUTH DAILY WITH BREAKFAST 90 tablet 0   ipratropium-albuterol (DUONEB) 0.5-2.5 (3) MG/3ML SOLN USE 3 ML VIA NEBULIZER EVERY 6 HOURS AS NEEDED 360 mL 1   ketoconazole (NIZORAL) 2 % cream Apply 1 Application topically daily. 15 g 0   levothyroxine (SYNTHROID) 75 MCG tablet TAKE 1 TABLET(75 MCG) BY MOUTH DAILY BEFORE BREAKFAST 90 tablet 3   metFORMIN (GLUCOPHAGE) 500 MG tablet TAKE 1 TABLET(500 MG) BY MOUTH TWICE DAILY WITH A MEAL 180 tablet 3   montelukast (SINGULAIR) 10 MG tablet TAKE 1 TABLET(10 MG) BY MOUTH AT BEDTIME 30 tablet 11   Multiple Vitamin (MULTIVITAMIN) capsule Take 1 capsule by mouth daily.     nystatin cream (MYCOSTATIN) Apply 1 Application topically 2 (two) times daily. 30 g 5   predniSONE (DELTASONE) 5 MG tablet TAKE 1 TABLET(5 MG) BY MOUTH DAILY  WITH BREAKFAST 90 tablet 1   cephALEXin (KEFLEX) 500 MG capsule Take 1 capsule (500 mg total) by mouth 3 (three) times daily. (Patient not taking: Reported on 09/27/2023) 21 capsule 0   No facility-administered medications prior to visit.     Per HPI unless specifically indicated in ROS section below Review of Systems  Constitutional:  Negative for fatigue and fever.  HENT:  Negative for congestion.   Eyes:  Negative for pain.  Respiratory:  Positive for shortness of breath. Negative for cough.   Cardiovascular:  Negative for chest pain, palpitations and leg swelling.   Gastrointestinal:  Negative for abdominal pain.  Genitourinary:  Negative for dysuria and vaginal bleeding.  Musculoskeletal:  Negative for back pain.  Neurological:  Negative for syncope, light-headedness and headaches.  Psychiatric/Behavioral:  Negative for dysphoric mood.     Objective:  BP 120/84 (BP Location: Left Arm, Patient Position: Sitting, Cuff Size: Large)   Pulse 75   Temp 97.8 F (36.6 C) (Oral)   Ht 5' 2.5" (1.588 m)   Wt 181 lb (82.1 kg)   SpO2 95%   BMI 32.58 kg/m   Wt Readings from Last 3 Encounters:  09/27/23 181 lb (82.1 kg)  09/08/23 181 lb 4 oz (82.2 kg)  07/27/23 182 lb (82.6 kg)      Physical Exam Constitutional:      General: She is not in acute distress.    Appearance: Normal appearance. She is well-developed. She is not ill-appearing or toxic-appearing.  HENT:     Head: Normocephalic.     Right Ear: Hearing, tympanic membrane, ear canal and external ear normal. Tympanic membrane is not erythematous, retracted or bulging.     Left Ear: Hearing, tympanic membrane, ear canal and external ear normal. Tympanic membrane is not erythematous, retracted or bulging.     Nose: No mucosal edema or rhinorrhea.     Right Sinus: No maxillary sinus tenderness or frontal sinus tenderness.     Left Sinus: No maxillary sinus tenderness or frontal sinus tenderness.     Mouth/Throat:     Mouth: No oral lesions.     Pharynx: Uvula midline.  Eyes:     General: Lids are normal. Lids are everted, no foreign bodies appreciated.     Conjunctiva/sclera: Conjunctivae normal.     Pupils: Pupils are equal, round, and reactive to light.  Neck:     Thyroid: No thyroid mass or thyromegaly.     Vascular: No carotid bruit.     Trachea: Trachea normal.  Cardiovascular:     Rate and Rhythm: Normal rate and regular rhythm.     Pulses: Normal pulses.     Heart sounds: Normal heart sounds, S1 normal and S2 normal. No murmur heard.    No friction rub. No gallop.  Pulmonary:      Effort: Pulmonary effort is normal. No tachypnea or respiratory distress.     Breath sounds: Normal breath sounds. No decreased breath sounds, wheezing, rhonchi or rales.  Abdominal:     General: Bowel sounds are normal.     Palpations: Abdomen is soft.     Tenderness: There is no abdominal tenderness.  Musculoskeletal:     Cervical back: Normal range of motion and neck supple.  Skin:    General: Skin is warm and dry.     Findings: No rash.  Neurological:     Mental Status: She is alert.  Psychiatric:        Mood and Affect: Mood is  not anxious or depressed.        Speech: Speech normal.        Behavior: Behavior normal. Behavior is cooperative.        Thought Content: Thought content normal.        Judgment: Judgment normal.        No picture given error uploading: Skin lesion.Marland Kitchen left anterior calf, raised , irregular, 1 cm diameter.. sore to palpation, no heat pr surrounding erythema.  Results for orders placed or performed in visit on 09/27/23  POCT glycosylated hemoglobin (Hb A1C)  Result Value Ref Range   Hemoglobin A1C 6.6 (A) 4.0 - 5.6 %   HbA1c POC (<> result, manual entry)     HbA1c, POC (prediabetic range)     HbA1c, POC (controlled diabetic range)      Assessment and Plan  Steroid-induced diabetes mellitus, subsequent encounter [facility]) Assessment & Plan: Chronic, tolerable control , continue current medication regimen  metformin 500 mg BID and glipizide xl 10 mg daily  Associated with neuropathy.   Orders: -     POCT glycosylated hemoglobin (Hb A1C)  Neuropathy due to type 2 diabetes mellitus (HCC) Assessment & Plan: Chronic, associated with diabetes, moderate control   Tolerable on 200 mg -300 mg  gabapentin  po qHS.  Preulcerative calluses , has diabetic shoes       No follow-ups on file.   Kerby Nora, MD

## 2023-09-27 NOTE — Telephone Encounter (Signed)
Received fax from [facility] stating Flutic/Vilan 100-25 mcg was not covered by patient's insurance. Alternative:  Wexela or Breo.  Please advise.

## 2023-09-27 NOTE — Assessment & Plan Note (Signed)
Chronic, no current COPD exacerbation. On nocturnal oxygen and Breo Ellipta. At baseline today.

## 2023-09-27 NOTE — Patient Instructions (Addendum)
Start ALA 600 mg daily for neuropathy.  We will refer to dermatology.

## 2023-09-27 NOTE — Assessment & Plan Note (Signed)
Acute.. cellulitis resolved but lesion  not healing.. concern for squamous cell Ca.  She sees Dr. [place] Sink but cannot get in for months... will place semi urgent referral for evaluation given pain at site.

## 2023-09-28 ENCOUNTER — Ambulatory Visit: Payer: Medicare Other

## 2023-09-30 ENCOUNTER — Other Ambulatory Visit: Payer: Self-pay | Admitting: Family Medicine

## 2023-10-02 NOTE — Telephone Encounter (Signed)
Last office visit 09/27/23 for DM.  Last refilled 03/28/23 for #90 with 1 refill.  Next Appt: No future appointments with PCP.

## 2023-10-04 ENCOUNTER — Ambulatory Visit: Payer: Medicare Other

## 2023-10-04 DIAGNOSIS — M542 Cervicalgia: Secondary | ICD-10-CM | POA: Diagnosis not present

## 2023-10-04 DIAGNOSIS — M6281 Muscle weakness (generalized): Secondary | ICD-10-CM

## 2023-10-04 NOTE — Therapy (Signed)
OUTPATIENT PHYSICAL THERAPY CERVICAL TREATMENT  Patient Name: Kadashia Krieger MRN: [id number] DOB:Jul 30, 1930, 89 y.o., female Today's Date: 10/04/2023  END OF SESSION:  PT End of Session - 10/04/23 1124     Visit Number 12    Number of Visits 13    Date for PT Re-Evaluation 10/12/23    PT Start Time 1115    PT Stop Time 1200    PT Time Calculation (min) 45 min    Activity Tolerance Patient tolerated treatment well    Behavior During Therapy WFL for tasks assessed/performed             Past Medical History:  Diagnosis Date   Actinic keratosis    Arthritis    Asthma    COPD (chronic obstructive pulmonary disease) (HCC)    Diabetes (HCC)    Thyroid disease    Past Surgical History:  Procedure Laterality Date   ABDOMINAL HYSTERECTOMY  1984   benign, TAH.   APPENDECTOMY  1939    CATARACT EXTRACTION, BILATERAL     CHOLECYSTECTOMY  1987   NASAL SINUS SURGERY  1990   REPLACEMENT TOTAL KNEE Right 2007   Patient Active Problem List   Diagnosis Date Noted   Skin lesion of left leg 09/27/2023   Foot injury, left, initial encounter 07/11/2023   Cellulitis of left lower extremity 07/11/2023   Candidal intertrigo 05/16/2023   Cheilitis 01/19/2023   Pre-ulcerative calluses 01/19/2023   Vertigo 12/13/2022   DOE (dyspnea on exertion) 03/03/2022   Left lower quadrant abdominal pain 03/03/2022   Neuropathy due to type 2 diabetes mellitus (HCC) 03/26/2021   Arthritis 12/07/2017   COPD exacerbation (HCC) 05/18/2017   OAB (overactive bladder) 09/01/2016   Seasonal allergies 09/01/2016   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) 09/22/2014   Hypothyroid 09/22/2014   COPD mixed type (HCC) 08/12/2014    PCP: Judy Pimple, MD  REFERRING PROVIDER: Excell Seltzer, MD  REFERRING DIAG:  M54.2 (ICD-10-CM) - Neck pain on left side    THERAPY DIAG:  Cervicalgia  Muscle weakness (generalized)  Rationale for Evaluation and Treatment:  Rehabilitation  ONSET DATE: Couple of months  SUBJECTIVE:                                                                                                                                                                                                         SUBJECTIVE STATEMENT: Pt reports mild neck pain. 3/10 NPS today. Reports foot pain has bothered her more than anything. Hand dominance: Right  PERTINENT HISTORY:  Pt reports  pain in the L side of her neck and shoulder region that has been sustained over the past several months. She believes she may have "pulled a muscle" at her water aerobics class when lifting 1# weights overhead. Using a heating pack has helped some. Pain is a 1-2/ 10 NPS at rest and the worst pain of 3-4/10 NPS with mobility. Pt reports constant aching pain in the L shoulder area. Pain primarily on the L side of neck with overhead movements. Denies radicular symptoms. Pain located along L levator scapulae region. Pt prescribed Voltarin that has not helped reduce the pain. No changes in pain throughout a 24 hour period. PAIN:  Are you having pain? Yes: NPRS scale: 3/10 Pain location: L levator scapulae pain Pain description: Achey, dull  Aggravating factors: Overhead movements Relieving factors: Heating pad   PRECAUTIONS: None  RED FLAGS: None and Bowel or bladder incontinence: No     WEIGHT BEARING RESTRICTIONS: No  FALLS:  Has patient fallen in last 6 months? Yes. Number of falls 1 Tripped over garbage can, fell in the garage two months ago   OCCUPATION: Retired   PLOF: Independent  PATIENT GOALS: Get the pain feeling a little better.   NEXT MD VISIT: N/A  OBJECTIVE:   DIAGNOSTIC FINDINGS:   DG Cervical Spine Complete  on 06/07/2023 (per file) EXAM: CERVICAL SPINE - COMPLETE 4+ VIEW   COMPARISON:  Cervical spine radiographs 10/19/2015   FINDINGS: The cervical spine is imaged through the C6 vertebral body in the lateral projection. There is  slight reversal of the normal cervical lordosis centered at C4 with trace retrolisthesis of C4 on C5 and C5 on C6, similar to the prior study. Vertebral body heights are preserved, without evidence of acute injury.   There is moderate disc space narrowing at C4-C5 through C6-C7 with mild associated degenerative endplate change. There is overall mild facet arthropathy without evidence of high-grade osseous neural foraminal stenosis.   The prevertebral soft tissues are unremarkable. There are coarsened interstitial markings throughout the imaged lungs. There is no focal airspace opacity.  IMPRESSION: 1. No acute finding in the cervical spine. 2. Overall mild for age degenerative changes as above with disc space narrowing and degenerative endplate change at C4-C5 through C6-C7.    PATIENT SURVEYS:  FOTO 51/63  COGNITION: Overall cognitive status: Within functional limits for tasks assessed  SENSATION: WFL  POSTURE: rounded shoulders and increased thoracic kyphosis  PALPATION: TTP at the L levator scapulae insertion, and L peri-scapular musculature.  CERVICAL ROM:   Active ROM A/PROM (deg) eval AROM (Deg)  10/10  Flexion 40 45  Extension 30 38  Right lateral flexion 20* 25  Left lateral flexion 30 35  Right rotation 60*  65  Left rotation 65 63   (Blank rows = not tested)  UPPER EXTREMITY MMT:  MMT Right eval Left eval  Shoulder flexion 4 4-  Shoulder extension    Shoulder abduction 4 4-  Shoulder adduction    Shoulder internal rotation 4 4  Shoulder external rotation 4 4  Middle trapezius    Lower trapezius (tested in seated position resisting against scapular retraction) 4 4-  Upper trap (tested in seated position during shoulder shrug) 4+ 4+  Elbow flexion    Elbow extension    Wrist flexion    Wrist extension    Wrist ulnar deviation    Wrist radial deviation    Wrist pronation    Wrist supination    Grip strength     (  Blank rows = not  tested)  CERVICAL SPECIAL TESTS:  N/A will attempt DNF endurance testing next session dependent on pt's tolerance to supine  FUNCTIONAL TESTS:  N/A  TODAY'S TREATMENT: DATE: 10/04/23   There.ex: UBE x 2 min forward x 2 min backward lvl 4.0 Standing AAROM shoulder flexion with dowel, 5# AW. 3x8  Standing pec major bench press with 5# AW. 3x12 OMEGA exercises:   Lat pull down: 25#, 3x8 Standing D2 flexion BUE with 2 KG med ball, x12/direction. Standing wall angels: 2x8    Application of biofreeze to L levator scap and upper trap for pain modulation. Unbilled.   PATIENT EDUCATION:  Education details: HEP given to pt  Person educated: Patient Education method: Medical illustrator Education comprehension: verbalized understanding and returned demonstration  HOME EXERCISE PROGRAM:   Access Code: [email or web address] URL: [email or web address] Date: 07/26/2023 Prepared by: Loralyn Freshwater  Exercises - Seated Cat Cow  - 1 x daily - 7 x weekly - 2 sets - 10 reps - 5 seconds hold - Standing Shoulder Row with Anchored Resistance  - 1 x daily - 7 x weekly - 2 sets - 10 reps - Single Arm Shoulder Extension with Anchored Resistance  - 1 x daily - 7 x weekly - 2 sets - 10 reps   ASSESSMENT:  CLINICAL IMPRESSION: Continuing PT POC with focus on periscapular strengthening today. Pt tolerating overall new and regular exercises well without flare up of pain. Pt does require regular seated rest breaks due to her COPD and muscle fatigue in her shoulders. Pt aware of either d/c or recert next visit. Pt has consistent, mild pain in her L neck. Pt would continue to benefit from skilled PT interventions to address remaining cervical AROM, strength and pain deficits  to improve QoL and return to PLOF.   OBJECTIVE IMPAIRMENTS: cardiopulmonary status limiting activity, decreased activity tolerance, decreased mobility, decreased ROM, decreased strength, hypomobility, impaired flexibility,  and pain.   ACTIVITY LIMITATIONS: lifting and reach over head  PARTICIPATION LIMITATIONS: community activity  PERSONAL FACTORS: Age and 1 comorbidity: COPD  are also affecting patient's functional outcome.   REHAB POTENTIAL: Good  CLINICAL DECISION MAKING: Stable/uncomplicated  EVALUATION COMPLEXITY: Low   GOALS: Goals reviewed with patient? Yes  SHORT TERM GOALS: Target date: 08/16/23  Pt will be independent with HEP to improve thoracic posture, increase shoulder strength, and decrease pain with functional activities   Baseline: 07/26/23: HEP given to pt Goal status: MET   LONG TERM GOALS: Target date: 10/12/23  Pt will improve FOTO to target score to demonstrate clinically significant improvement in functional mobility.   Baseline: 07/26/23: Administer FOTO at next visit, 07/31/23: 51/63 08/24/23: 56/63 09/14/23: 53/63 Goal status: ONGOING  2.   Pt will improve cervical AROM in all planes (particularly lateral flexion) by at least 7 degrees to demonstrate clinically significant improvement with overhead tasks.  Baseline: Active ROM A/PROM (deg) eval AROM (Deg)  10/10 AROM (deg) 10/31  Flexion 40 45 50  Extension 30 38 40  Right lateral flexion 20* 25 25  Left lateral flexion 30 35 25  Right rotation 60*  65 38  Left rotation 65 63 33   Goal status: ONGOING   PLAN:  PT FREQUENCY: 1-2x/week  PT DURATION: 6 weeks  PLANNED INTERVENTIONS: Therapeutic exercises, Therapeutic activity, Neuromuscular re-education, Balance training, Gait training, Patient/Family education, Self Care, and Joint mobilization  PLAN FOR NEXT SESSION: RECERT or D/C  Delphia Grates. Fairly IV, PT, DPT Physical Therapist- [place]  [facility]  10/04/23, 12:07 PM

## 2023-10-09 ENCOUNTER — Ambulatory Visit (INDEPENDENT_AMBULATORY_CARE_PROVIDER_SITE_OTHER): Payer: Medicare Other | Admitting: Dermatology

## 2023-10-09 ENCOUNTER — Encounter: Payer: Self-pay | Admitting: Dermatology

## 2023-10-09 DIAGNOSIS — D492 Neoplasm of unspecified behavior of bone, soft tissue, and skin: Secondary | ICD-10-CM

## 2023-10-09 DIAGNOSIS — C44729 Squamous cell carcinoma of skin of left lower limb, including hip: Secondary | ICD-10-CM

## 2023-10-09 DIAGNOSIS — C4492 Squamous cell carcinoma of skin, unspecified: Secondary | ICD-10-CM

## 2023-10-09 DIAGNOSIS — D489 Neoplasm of uncertain behavior, unspecified: Secondary | ICD-10-CM

## 2023-10-09 HISTORY — DX: Squamous cell carcinoma of skin, unspecified: C44.92

## 2023-10-09 NOTE — Patient Instructions (Addendum)

## 2023-10-09 NOTE — Progress Notes (Signed)
   Follow-Up Visit   Subjective  Megan Bradshaw is a 89 y.o. female who presents for the following: spot at left leg below knee. Present for about 1 month. Patient saw PCP and was given a rx for Keflex.    The patient has spots, moles and lesions to be evaluated, some may be new or changing and the patient may have concern these could be cancer.   The following portions of the chart were reviewed this encounter and updated as appropriate: medications, allergies, medical history  Review of Systems:  No other skin or systemic complaints except as noted in HPI or Assessment and Plan.  Objective  Well appearing patient in no apparent distress; mood and affect are within normal limits.   A focused examination was performed of the following areas:   Relevant exam findings are noted in the Assessment and Plan.  left lower lateral leg Pink ulcerated plaque 15 mm         Assessment & Plan     Neoplasm of uncertain behavior left lower lateral leg  Skin / nail biopsy Type of biopsy: tangential   Informed consent: discussed and consent obtained   Timeout: patient name, date of birth, surgical site, and procedure verified   Procedure prep:  Patient was prepped and draped in usual sterile fashion Prep type:  Isopropyl alcohol Anesthesia: the lesion was anesthetized in a standard fashion   Anesthetic:  1% lidocaine w/ epinephrine 1-100,000 buffered w/ 8.4% NaHCO3 Instrument used: DermaBlade   Hemostasis achieved with: pressure and aluminum chloride   Outcome: patient tolerated procedure well   Post-procedure details: sterile dressing applied and wound care instructions given   Dressing type: bandage and petrolatum    Specimen 1 - Surgical pathology Differential Diagnosis: r/o SCC  Check Margins: No Pink ulcerated plaque 15 mm    Return for as scheduled.  Anise Salvo, RMA, am acting as scribe for Elie Goody, MD .   Documentation: I have reviewed the  above documentation for accuracy and completeness, and I agree with the above.  Elie Goody, MD

## 2023-10-10 ENCOUNTER — Ambulatory Visit: Payer: Medicare Other

## 2023-10-13 LAB — SURGICAL PATHOLOGY

## 2023-10-16 ENCOUNTER — Telehealth: Payer: Self-pay

## 2023-10-16 DIAGNOSIS — C4492 Squamous cell carcinoma of skin, unspecified: Secondary | ICD-10-CM

## 2023-10-16 NOTE — Telephone Encounter (Signed)
-----   Message from Dennis sent at 10/13/2023  5:33 PM EST ----- Diagnosis: left lower lateral leg :       SQUAMOUS CELL CARCINOMA, KERATOACANTHOMA TYPE    Please call with diagnosis and determine where the patient would like to have Mohs surgery.  Explanation: This is a squamous cell skin cancer that has grown beyond the surface of the skin and is invading the second layer of the skin. It has the potential to spread beyond the skin and threaten your health, so I recommend treating it.  Treatment: Given the location and type of skin cancer, I recommend Mohs surgery. Mohs surgery involves cutting out the skin cancer and then checking under the microscope to ensure the whole skin cancer was removed. If any skin cancer remains, the surgeon will cut out more until it is fully removed. The cure rate is about 98-99%. Once the Mohs surgeon confirms the skin cancer is out, they will discuss the options to repair or heal the area. You must take it easy for about two weeks after surgery (no lifting over 10-15 lbs, avoid activity to get your heart rate and blood pressure up). It is done at another office outside of [place] ([place], [place], or [place]).

## 2023-10-19 ENCOUNTER — Ambulatory Visit: Payer: Medicare Other | Attending: Family Medicine

## 2023-10-19 DIAGNOSIS — M6281 Muscle weakness (generalized): Secondary | ICD-10-CM | POA: Insufficient documentation

## 2023-10-19 DIAGNOSIS — M542 Cervicalgia: Secondary | ICD-10-CM | POA: Diagnosis not present

## 2023-10-19 NOTE — Therapy (Signed)
OUTPATIENT PHYSICAL THERAPY CERVICAL TREATMENT  Patient Name: Megan Bradshaw MRN: [id number] DOB:[birth year], 89 y.o., female Today's Date: 10/19/2023  END OF SESSION:  PT End of Session - 10/19/23 0903     Visit Number 13    Number of Visits 13    Date for PT Re-Evaluation 10/12/23    PT Start Time 0902    PT Stop Time 0921    PT Time Calculation (min) 19 min    Activity Tolerance Patient tolerated treatment well    Behavior During Therapy [facility] for tasks assessed/performed             Past Medical History:  Diagnosis Date   Actinic keratosis    Arthritis    Asthma    COPD (chronic obstructive pulmonary disease) (HCC)    Diabetes (HCC)    SCC (squamous cell carcinoma) 10/09/2023   left lower lateral leg, referral for Mohs   Thyroid disease    Past Surgical History:  Procedure Laterality Date   ABDOMINAL HYSTERECTOMY  1984   benign, TAH.   APPENDECTOMY  1939    CATARACT EXTRACTION, BILATERAL     CHOLECYSTECTOMY  1987   NASAL SINUS SURGERY  1990   REPLACEMENT TOTAL KNEE Right 2007   Patient Active Problem List   Diagnosis Date Noted   Skin lesion of left leg 09/27/2023   Foot injury, left, initial encounter 07/11/2023   Cellulitis of left lower extremity 07/11/2023   Candidal intertrigo 05/16/2023   Cheilitis 01/19/2023   Pre-ulcerative calluses 01/19/2023   Vertigo 12/13/2022   DOE (dyspnea on exertion) 03/03/2022   Left lower quadrant abdominal pain 03/03/2022   Neuropathy due to type 2 diabetes mellitus (HCC) 03/26/2021   Arthritis 12/07/2017   COPD exacerbation (HCC) 05/18/2017   OAB (overactive bladder) 09/01/2016   Seasonal allergies 09/01/2016   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) 09/22/2014   Hypothyroid 09/22/2014   COPD mixed type (HCC) 08/12/2014    PCP: Judy Pimple, MD  REFERRING PROVIDER: Excell Seltzer, MD  REFERRING DIAG:  M54.2 (ICD-10-CM) - Neck pain on left side    THERAPY DIAG:   Cervicalgia  Muscle weakness (generalized)  Rationale for Evaluation and Treatment: Rehabilitation  ONSET DATE: Couple of months  SUBJECTIVE:                                                                                                                                                                                                         SUBJECTIVE STATEMENT: Pt reports needing to discharge. Overall feels her neck pain  is better. Hand dominance: Right  PERTINENT HISTORY:  Pt reports pain in the L side of her neck and shoulder region that has been sustained over the past several months. She believes she may have "pulled a muscle" at her water aerobics class when lifting 1# weights overhead. Using a heating pack has helped some. Pain is a 1-2/ 10 NPS at rest and the worst pain of 3-4/10 NPS with mobility. Pt reports constant aching pain in the L shoulder area. Pain primarily on the L side of neck with overhead movements. Denies radicular symptoms. Pain located along L levator scapulae region. Pt prescribed Voltarin that has not helped reduce the pain. No changes in pain throughout a 24 hour period. PAIN:  Are you having pain? Yes: NPRS scale: 3/10 Pain location: L levator scapulae pain Pain description: Achey, dull  Aggravating factors: Overhead movements Relieving factors: Heating pad   PRECAUTIONS: None  RED FLAGS: None and Bowel or bladder incontinence: No     WEIGHT BEARING RESTRICTIONS: No  FALLS:  Has patient fallen in last 6 months? Yes. Number of falls 1 Tripped over garbage can, fell in the garage two months ago   OCCUPATION: Retired   PLOF: Independent  PATIENT GOALS: Get the pain feeling a little better.   NEXT MD VISIT: N/A  OBJECTIVE:   DIAGNOSTIC FINDINGS:   DG Cervical Spine Complete  on 06/07/2023 (per file) EXAM: CERVICAL SPINE - COMPLETE 4+ VIEW   COMPARISON:  Cervical spine radiographs 10/19/2015   FINDINGS: The cervical spine is imaged  through the C6 vertebral body in the lateral projection. There is slight reversal of the normal cervical lordosis centered at C4 with trace retrolisthesis of C4 on C5 and C5 on C6, similar to the prior study. Vertebral body heights are preserved, without evidence of acute injury.   There is moderate disc space narrowing at C4-C5 through C6-C7 with mild associated degenerative endplate change. There is overall mild facet arthropathy without evidence of high-grade osseous neural foraminal stenosis.   The prevertebral soft tissues are unremarkable. There are coarsened interstitial markings throughout the imaged lungs. There is no focal airspace opacity.  IMPRESSION: 1. No acute finding in the cervical spine. 2. Overall mild for age degenerative changes as above with disc space narrowing and degenerative endplate change at C4-C5 through C6-C7.    PATIENT SURVEYS:  FOTO 51/63  COGNITION: Overall cognitive status: Within functional limits for tasks assessed  SENSATION: WFL  POSTURE: rounded shoulders and increased thoracic kyphosis  PALPATION: TTP at the L levator scapulae insertion, and L peri-scapular musculature.  CERVICAL ROM:   Active ROM A/PROM (deg) eval AROM (Deg)  10/10  Flexion 40 45  Extension 30 38  Right lateral flexion 20* 25  Left lateral flexion 30 35  Right rotation 60*  65  Left rotation 65 63   (Blank rows = not tested)  UPPER EXTREMITY MMT:  MMT Right eval Left eval  Shoulder flexion 4 4-  Shoulder extension    Shoulder abduction 4 4-  Shoulder adduction    Shoulder internal rotation 4 4  Shoulder external rotation 4 4  Middle trapezius    Lower trapezius (tested in seated position resisting against scapular retraction) 4 4-  Upper trap (tested in seated position during shoulder shrug) 4+ 4+  Elbow flexion    Elbow extension    Wrist flexion    Wrist extension    Wrist ulnar deviation    Wrist radial deviation    Wrist pronation  Wrist supination    Grip strength     (Blank rows = not tested)  CERVICAL SPECIAL TESTS:  N/A will attempt DNF endurance testing next session dependent on pt's tolerance to supine  FUNCTIONAL TESTS:  N/A  TODAY'S TREATMENT: DATE: 10/19/23   There.ex: UBE x 2 min forward x 2 min backward lvl 4.0  Reviewing all goals as pt is at the end of her current POC.    PATIENT EDUCATION:  Education details: HEP given to pt  Person educated: Patient Education method: Medical illustrator Education comprehension: verbalized understanding and returned demonstration  HOME EXERCISE PROGRAM:  Access Code: [email or web address] URL: [email or web address] Date: 10/19/2023 Prepared by: Ronnie Derby  Exercises - Seated Cat Cow  - 1 x daily - 7 x weekly - 2 sets - 10 reps - 5 seconds hold - Shoulder Flexion Serratus Activation with Resistance  - 1 x daily - 4-5 x weekly - 3 sets - 10 reps - Standing Shoulder Row with Anchored Resistance  - 1 x daily - 4-5 x weekly - 2 sets - 10 reps - Single Arm Shoulder Extension with Anchored Resistance  - 1 x daily - 4-5 x weekly - 2 sets - 10 reps - Seated Shoulder Horizontal Abduction with Resistance  - 1 x daily - 4-5 x weekly - 2 sets - 10 reps - Seated Levator Scapulae Stretch  - 1 x daily - 7 x weekly - 1 sets - 3 reps - 30 hold - Standing Elbow Extension with Self-Anchored Resistance  - 1 x daily - 4-5 x weekly - 2 sets - 12 reps - Standing Bicep Curls with Resistance  - 1 x daily - 4-5 x weekly - 2 sets - 12 reps   Access Code: [email or web address] URL: [email or web address] Date: 07/26/2023 Prepared by: Loralyn Freshwater  Exercises - Seated Cat Cow  - 1 x daily - 7 x weekly - 2 sets - 10 reps - 5 seconds hold - Standing Shoulder Row with Anchored Resistance  - 1 x daily - 7 x weekly - 2 sets - 10 reps - Single Arm Shoulder Extension with Anchored Resistance  - 1 x daily - 7 x weekly - 2 sets - 10 reps   ASSESSMENT:  CLINICAL  IMPRESSION: Pt at end of POC warranting discharge. Pt has made subtle improvements in FOTO score but vast improvements generally in her cervical AROM compared to beginning of PT. All planes painless today where pt was having cervical pain with facet narrowing planes. Pt overall reports decreased cervical pain and has good understanding of HEP denying need to review or update HEP. Pt agreeable to discharge at this time as pt has plateaued with POC.   OBJECTIVE IMPAIRMENTS: cardiopulmonary status limiting activity, decreased activity tolerance, decreased mobility, decreased ROM, decreased strength, hypomobility, impaired flexibility, and pain.   ACTIVITY LIMITATIONS: lifting and reach over head  PARTICIPATION LIMITATIONS: community activity  PERSONAL FACTORS: Age and 1 comorbidity: COPD  are also affecting patient's functional outcome.   REHAB POTENTIAL: Good  CLINICAL DECISION MAKING: Stable/uncomplicated  EVALUATION COMPLEXITY: Low   GOALS: Goals reviewed with patient? Yes  SHORT TERM GOALS: Target date: 08/16/23  Pt will be independent with HEP to improve thoracic posture, increase shoulder strength, and decrease pain with functional activities   Baseline: 07/26/23: HEP given to pt Goal status: MET   LONG TERM GOALS: Target date: 10/12/23  Pt will improve FOTO to target score to demonstrate clinically significant improvement in functional mobility.   Baseline: 07/26/23:  Administer FOTO at next visit, 07/31/23: 51/63 08/24/23: 56/63 09/14/23: 53/63; 10/19/23: 53/63 Goal status: ONGOING  2.   Pt will improve cervical AROM in all planes (particularly lateral flexion) by at least 7 degrees to demonstrate clinically significant improvement with overhead tasks.  Baseline: Active ROM A/PROM (deg) eval AROM (Deg)  10/10 AROM (deg) 10/31 AROM (deg)12/5  Flexion 40 45 50 50  Extension 30 38 40 60  Right lateral flexion 20* 25 25 45  Left lateral flexion 30 35 25 40  Right rotation 60*   65 38 60  Left rotation 65 63 33 55   Goal status: PARTIALLY MET   PLAN:  PT FREQUENCY: 1-2x/week  PT DURATION: 6 weeks  PLANNED INTERVENTIONS: Therapeutic exercises, Therapeutic activity, Neuromuscular re-education, Balance training, Gait training, Patient/Family education, Self Care, and Joint mobilization  PLAN FOR NEXT SESSION: N/A. Discharged from PT.   Delphia Grates. Fairly IV, PT, DPT Physical Therapist- [place]  [facility]  10/19/23, 9:24 AM

## 2023-10-23 ENCOUNTER — Ambulatory Visit: Payer: Medicare Other

## 2023-11-03 DIAGNOSIS — C44729 Squamous cell carcinoma of skin of left lower limb, including hip: Secondary | ICD-10-CM | POA: Diagnosis not present

## 2023-11-06 ENCOUNTER — Telehealth: Payer: Self-pay

## 2023-11-06 NOTE — Telephone Encounter (Signed)
Progress notes from [facility] in media. aw

## 2023-11-29 ENCOUNTER — Ambulatory Visit: Payer: Medicare Other | Admitting: Pulmonary Disease

## 2023-11-29 ENCOUNTER — Encounter: Payer: Self-pay | Admitting: Pulmonary Disease

## 2023-11-29 VITALS — BP 138/86 | HR 79 | Temp 96.9°F | Ht 62.5 in | Wt 181.2 lb

## 2023-11-29 DIAGNOSIS — E66811 Obesity, class 1: Secondary | ICD-10-CM

## 2023-11-29 DIAGNOSIS — R0602 Shortness of breath: Secondary | ICD-10-CM

## 2023-11-29 DIAGNOSIS — G4736 Sleep related hypoventilation in conditions classified elsewhere: Secondary | ICD-10-CM | POA: Diagnosis not present

## 2023-11-29 DIAGNOSIS — J4489 Other specified chronic obstructive pulmonary disease: Secondary | ICD-10-CM

## 2023-11-29 DIAGNOSIS — J449 Chronic obstructive pulmonary disease, unspecified: Secondary | ICD-10-CM | POA: Diagnosis not present

## 2023-11-29 NOTE — Progress Notes (Signed)
 Subjective:    Patient ID: Megan Bradshaw, female    DOB: 1930-01-24, 89 y.o.   MRN: [id number]  Patient Care Team: Judithann Novas, MD as PCP - General (Family Medicine) Alta Ast, [facility] (Inactive) as Pharmacist (Pharmacist) Marc Senior, MD as Consulting Physician (Pulmonary Disease)  Chief Complaint  Patient presents with   Follow-up    DOE. No wheezing or cough.    BACKGROUND/INTERVAL:Megan Bradshaw is an 89 year old former smoker (45 PY) who presents for follow-up on the issue of dyspnea and GOLD class II COPD.  Dyspnea out of proportion to her COPD.  This is a scheduled visit, she was last seen on 27 July 2023 by me.  She believes dyspnea is more manageable lately.  She has been compliant with overnight oxygen  and notices feeling refreshed in the morning and less fatigue during the day, this in turn has helped her dyspnea.   HPI Discussed the use of AI scribe software for clinical note transcription with the patient, who gave verbal consent to proceed.  History of Present Illness   The patient, a 89 year old former smoker with a history of stage 2 COPD and chronic dyspnea, reports no new or worsening symptoms. She has been managing her condition with daily use of Breo and albuterol , which she takes before exercising. The patient has been maintaining a regular exercise routine, including 30 minutes on the treadmill daily, and reports that her breathing has been "pretty good." She has not experienced any wheezing. The patient previously had been taking Singulair , but reports discontinuing this medication approximately a year ago. She has not noticed any adverse effects since stopping the Singulair .  Overall [name] feels very well and looks well.  She is fully ambulatory today without any assistive devices.     DATA: 10/22/14 PFTs: mild obstruction, FEV1 1.36 L (81%), FEV1/FVC 53%, TLC normal, DLCO 51% pred 02/16/16 PFTs: FVC: 2.23 L (90 %pred), FEV1: 1.30 L (71 %pred),  FEV1/FVC: 58% , TLC: invalid, DLCO 78% pred, consistent with mild/moderate obstructive defect 10/14/2019 2D echo: LVEF 50 to 55%, normal diastolics, normal pulmonary artery systolic pressure 03/31/2020 overnight oximetry on RA: No oxygen  desaturations of clinical significance. Lowest O2 sat 88% of less than 2 minutes duration.  Average O2 sat 93% 04/28/2022 overnight oximetry on RA: Patient qualified for nocturnal oxygen .  Basal SPO2 was 91% nadir to 87% total of 216 oxygen  desaturation events, ODI of 21 06/02/2022 PFTs: FEV1 1.12 L or 78% predicted, FVC is 2.15 L or 110% predicted.  FEV1/FVC 52%, no bronchodilator response. Lung volumes were normal.  Diffusion capacity moderately reduced.  Consistent with moderately severe obstructive airways disease, restriction probable likely due to obesity.  Moderately severe diffusion defect/abnormality. Since the prior study of 02/16/19/2017 there was a decrease in FEV1 by 180 mL. 06/27/2022 echocardiogram: LVEF 55%, normal wall motion abnormality, grade 1 diastolic dysfunction, RV function normal.  No valvular abnormalities.  No evidence of pulmonary hypertension.  Review of Systems A 10 point review of systems was performed and it is as noted above otherwise negative.   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) 09/22/2014   Hypothyroid 09/22/2014    COPD mixed type (HCC) 08/12/2014    Social History   Tobacco Use   Smoking status: Former    Current packs/day: 0.00    Average packs/day: 1.5 packs/day for 30.0 years (45.0 ttl pk-yrs)    Types: Cigarettes    Start date: 11/14/1957    Quit date: 11/15/1987    Years since quitting: 36.0   Smokeless tobacco: Never  Substance Use Topics   Alcohol use: No    Alcohol/week: 0.0 standard drinks of alcohol    Allergies  Allergen Reactions   Myrbetriq  [Mirabegron  Er] Other (See Comments)    GI Problem   Augmentin  [Amoxicillin -Pot Clavulanate] Diarrhea    Current Meds  Medication Sig   ACCU-CHEK GUIDE test strip USE TO TEST TWICE DAILY   Accu-Chek Softclix Lancets lancets Use to check blood sugar 1 times a day   albuterol  (PROVENTIL ) (2.5 MG/3ML) 0.083% nebulizer solution Take 3 mLs (2.5 mg total) by nebulization every 4 (four) hours as needed for wheezing or shortness of breath.   albuterol  (VENTOLIN  HFA) 108 (90 Base) MCG/ACT inhaler INHALE 1 TO 2 PUFFS BY MOUTH EVERY 6 HOURS FOR PAIN OR WHEEZING OR SHORTNESS OF BREATH   B Complex-Folic Acid (BENFOTIAMINE MULTI-B) CAPS Take 1 capsule by mouth daily.   Blood Glucose Monitoring Suppl (ACCU-CHEK GUIDE ME) w/Device KIT See admin instructions.   BREO ELLIPTA  100-25 MCG/ACT AEPB Inhale 1 puff into the lungs daily.   Cholecalciferol (VITAMIN D  PO) Take 1 tablet by mouth daily.   clobetasol  cream (TEMOVATE ) 0.05 % Apply 1 Application topically daily as needed.   fluticasone  (FLONASE ) 50 MCG/ACT nasal spray Place 2 sprays into both nostrils daily.   gabapentin  (NEURONTIN ) 100 MG capsule TAKE 1 CAPSULE(100 MG) BY MOUTH THREE TIMES DAILY   glipiZIDE  (GLUCOTROL  XL) 10 MG 24 hr tablet TAKE 1 TABLET(10 MG) BY MOUTH DAILY WITH BREAKFAST   ipratropium-albuterol  (DUONEB) 0.5-2.5 (3) MG/3ML SOLN USE 3 ML VIA NEBULIZER EVERY 6 HOURS AS NEEDED   ketoconazole  (NIZORAL ) 2 % cream Apply 1 Application topically daily.   levothyroxine  (SYNTHROID ) 75 MCG  tablet TAKE 1 TABLET(75 MCG) BY MOUTH DAILY BEFORE BREAKFAST   metFORMIN  (GLUCOPHAGE ) 500 MG tablet TAKE 1 TABLET(500 MG) BY MOUTH TWICE DAILY WITH A MEAL   Multiple Vitamin (MULTIVITAMIN) capsule Take 1 capsule by mouth daily.   nystatin  cream (MYCOSTATIN ) Apply 1 Application topically 2 (two) times daily.   predniSONE  (DELTASONE ) 5 MG tablet TAKE 1 TABLET(5 MG) BY MOUTH DAILY WITH BREAKFAST   [DISCONTINUED] montelukast  (SINGULAIR ) 10 MG tablet TAKE 1 TABLET(10 MG) BY MOUTH AT BEDTIME    Immunization History  Administered Date(s) Administered   Fluad Quad(high Dose 65+) 09/03/2020, 08/02/2021, 08/14/2022   Fluad Trivalent(High Dose 65+) 07/11/2023   Influenza,inj,Quad PF,6+ Mos 08/20/2015, 07/22/2016, 08/14/2017, 08/23/2018, 08/29/2019   Influenza-Unspecified 08/14/2014   PFIZER(Purple Top)SARS-COV-2 Vaccination 01/07/2020, 01/28/2020, 10/11/2020   Pneumococcal Conjugate-13 08/20/2015   Pneumococcal Polysaccharide-23 08/29/2019   Respiratory Syncytial Virus Vaccine,Recomb Aduvanted(Arexvy) 08/14/2022        Objective:     BP 138/86 (BP Location: Right Arm, Cuff Size: Normal)   Pulse 79   Temp (!) 96.9 F (36.1 C)   Ht 5' 2.5" (1.588 m)   Wt 181 lb 3.2 oz (82.2 kg)  SpO2 99%   BMI 32.61 kg/m   SpO2: 99 % O2 Device: None (Room air)  GENERAL: Overweight elderly female, quite spry, very well-groomed.  Presents in transport chair today. No conversational dyspnea. No distress. HEAD: Normocephalic, atraumatic. EYES: Pupils equal, round, reactive to light.  No scleral icterus. MOUTH: Lower teeth in poor repair.  Oral mucosa moist. NECK: Supple. No thyromegaly. Trachea midline. No JVD.  No adenopathy. PULMONARY: She has increased AP diameter due to kyphosis.Good air entry bilaterally.  No adventitious sounds. CARDIOVASCULAR: S1 and S2. Regular rate and rhythm. Soft grade 1/6 systolic ejection murmur left sternal border. No rubs or gallops noted. ABDOMEN:  Obese,nondistended. MUSCULOSKELETAL: No lower extremity edema noted.   NEUROLOGIC: No overt focal deficits noted. Speech is fluent. SKIN: Intact,warm,dry. On limited exam no rashes. PSYCH: Mood and behavior normal.   Assessment & Plan:     ICD-10-CM   1. Stage 2 moderate COPD by GOLD classification (HCC)  J44.9     2. Nocturnal hypoxemia due to obstructive chronic bronchitis (HCC)  J44.89    G47.36     3. SOB (shortness of breath)  R06.02     4. Obesity (BMI 30.0-34.9)  R60.454      Discussion:    Chronic Obstructive Pulmonary Disease (COPD) Stage II [age]-year-old former smoker with stage II COPD and chronic dyspnea. Symptoms are well-managed with Breo Ellipta  100 mcg daily and albuterol  inhaler prior to exercise. Discontinued Singulair  for the past year without adverse effects. Engages in 30 minutes of treadmill exercise daily. Physical examination reveals no wheezing. - Continue Breo daily - Use albuterol  prior to exercise - Discontinue Singulair  - Follow up in six months unless symptoms worsen  General Health Maintenance Engages in regular physical activity, including 30 minutes on the treadmill daily, beneficial for overall health and COPD management. Uses diabetic-friendly shoes for comfort and support. - Encourage continued physical activity - Ensure proper footwear for diabetes management.     Advised if symptoms do not improve or worsen, to please contact office for sooner follow up or seek emergency care.    I spent 20 minutes of dedicated to the care of this patient on the date of this encounter to include pre-visit review of records, face-to-face time with the patient discussing conditions above, post visit ordering of testing, clinical documentation with the electronic health record, making appropriate referrals as documented, and communicating necessary findings to members of the patients care team.     C. Chloe Counter, MD Advanced Bronchoscopy PCCM  [facility]    *This note was generated using voice recognition software/Dragon and/or AI transcription program.  Despite best efforts to proofread, errors can occur which can change the meaning. Any transcriptional errors that result from this process are unintentional and may not be fully corrected at the time of dictation.

## 2023-11-29 NOTE — Patient Instructions (Signed)
 VISIT SUMMARY:  Today, we reviewed your chronic obstructive pulmonary disease (COPD) management. You reported no new or worsening symptoms and have been maintaining a regular exercise routine. Your breathing has been stable, and you have not experienced any wheezing. We discussed your current medications and made some adjustments.  YOUR PLAN:  -CHRONIC OBSTRUCTIVE PULMONARY DISEASE (COPD) STAGE II: COPD is a chronic lung condition that makes it hard to breathe. Your symptoms are well-managed with your current medications, Breo and albuterol . You should continue taking Breo daily and use albuterol  before exercising. Since you have not noticed any adverse effects after stopping Singulair  a year ago, you can discontinue it permanently. Please follow up in six months unless your symptoms worsen.  -GENERAL HEALTH MAINTENANCE: Regular physical activity is important for your overall health and helps manage COPD. Continue your daily 30-minute treadmill exercise. Make sure to wear proper footwear, especially diabetic-friendly shoes, to support your feet and overall well-being.  INSTRUCTIONS:  Please follow up in six months unless your symptoms worsen.

## 2023-12-04 ENCOUNTER — Telehealth: Payer: Self-pay

## 2023-12-04 NOTE — Telephone Encounter (Signed)
Specimen tracking and history updated from [facility] progress notes of left lower lateral leg. aw

## 2023-12-14 ENCOUNTER — Other Ambulatory Visit: Payer: Self-pay | Admitting: Family Medicine

## 2023-12-14 NOTE — Telephone Encounter (Signed)
Please schedule CPE with fasting labs prior for Dr. Ermalene Searing after 01/26/2024.

## 2023-12-14 NOTE — Telephone Encounter (Signed)
Patient has been scheduled

## 2023-12-22 ENCOUNTER — Ambulatory Visit (INDEPENDENT_AMBULATORY_CARE_PROVIDER_SITE_OTHER): Payer: Medicare Other | Admitting: Family Medicine

## 2023-12-22 ENCOUNTER — Encounter: Payer: Self-pay | Admitting: Family Medicine

## 2023-12-22 VITALS — BP 130/80 | HR 73 | Temp 98.5°F | Ht 62.5 in | Wt 180.4 lb

## 2023-12-22 DIAGNOSIS — R0789 Other chest pain: Secondary | ICD-10-CM | POA: Insufficient documentation

## 2023-12-22 DIAGNOSIS — J0100 Acute maxillary sinusitis, unspecified: Secondary | ICD-10-CM | POA: Diagnosis not present

## 2023-12-22 MED ORDER — PREDNISONE 20 MG PO TABS: ORAL_TABLET | ORAL | 0 refills | Status: DC

## 2023-12-22 MED ORDER — CETIRIZINE HCL 10 MG PO TABS: 10.0000 mg | ORAL_TABLET | Freq: Every day | ORAL | 11 refills | Status: DC

## 2023-12-22 MED ORDER — ALBUTEROL SULFATE HFA 108 (90 BASE) MCG/ACT IN AERS: INHALATION_SPRAY | RESPIRATORY_TRACT | 2 refills | Status: DC

## 2023-12-22 MED ORDER — FLUTICASONE PROPIONATE 50 MCG/ACT NA SUSP: 2.0000 | Freq: Every day | NASAL | 2 refills | Status: DC

## 2023-12-22 NOTE — Patient Instructions (Signed)
 Start chest wall stretching, heat. Complete prednisone  taper.  Start Zyrtec  at bedtime.  Continue Flonase  2 sprays per nostril daily Call if increasing shortness of breath, change in color of sputum or new fever. Go to the emergency room if severe shortness of breath.

## 2023-12-22 NOTE — Assessment & Plan Note (Signed)
 Acute, no clear sign of bacterial or viral infection. Pt not interested in COVID or flu testing which I think is reasonable. No sputum color change, no evidence of COPD exacerbation.  No indication for antibiotics. Symptoms correspond most with allergic sinusitis.  She will start Zyrtec  at bedtime, continue Flonase  2 sprays per nostril daily and we will complete a prednisone  taper.  She will follow her blood sugar while on.

## 2023-12-22 NOTE — Progress Notes (Signed)
 Patient ID: Megan Bradshaw, female    DOB: 24-Oct-1930, 89 y.o.   MRN: [id number]  This visit was conducted in person.  BP 130/80 (BP Location: Right Arm, Patient Position: Sitting, Cuff Size: Normal)   Pulse 73   Temp 98.5 F (36.9 C) (Temporal)   Ht 5' 2.5 (1.588 m)   Wt 180 lb 6 oz (81.8 kg)   SpO2 97%   BMI 32.47 kg/m    CC:  Chief Complaint  Patient presents with   Nasal Congestion    Symptoms started on Monday    Shortness of Breath   Cough    Mild    Subjective:   HPI: Megan Bradshaw is a 89 y.o. female presenting on 12/22/2023 for Nasal Congestion (Symptoms started on Monday/), Shortness of Breath, and Cough (Mild)   Date of onset:  5 days Initial symptoms included  nasal congestion, clear, occ face pain, no ear pain Symptoms progressed to Chest wall sore x last 2-3 night.. mild cough   Slight worsening of SOB. No wheeze   FBS 80-90..  Sick contacts:  none COVID testing:   none    Not sleeping at night given left leg pain.. shooting pain   Has history of chronic lung disease such :  COPD.  Had RSV and Flu shot former-smoker.       Relevant past medical, surgical, family and social history reviewed and updated as indicated. Interim medical history since our last visit reviewed. Allergies and medications reviewed and updated. Outpatient Medications Prior to Visit  Medication Sig Dispense Refill   ACCU-CHEK GUIDE test strip USE TO TEST TWICE DAILY 200 strip 1   Accu-Chek Softclix Lancets lancets Use to check blood sugar 1 times a day 100 each 3   albuterol  (PROVENTIL ) (2.5 MG/3ML) 0.083% nebulizer solution Take 3 mLs (2.5 mg total) by nebulization every 4 (four) hours as needed for wheezing or shortness of breath. 150 mL 5   albuterol  (VENTOLIN  HFA) 108 (90 Base) MCG/ACT inhaler INHALE 1 TO 2 PUFFS BY MOUTH EVERY 6 HOURS FOR PAIN OR WHEEZING OR SHORTNESS OF BREATH 18 g 2   B Complex-Folic Acid (BENFOTIAMINE MULTI-B) CAPS Take 1 capsule by mouth daily.  30 capsule 6   Blood Glucose Monitoring Suppl (ACCU-CHEK GUIDE ME) w/Device KIT See admin instructions.     BREO ELLIPTA  100-25 MCG/ACT AEPB Inhale 1 puff into the lungs daily. 180 each 3   Cholecalciferol (VITAMIN D  PO) Take 1 tablet by mouth daily.     clobetasol  cream (TEMOVATE ) 0.05 % Apply 1 Application topically daily as needed. 30 g 1   fluticasone  (FLONASE ) 50 MCG/ACT nasal spray Place 2 sprays into both nostrils daily. 16 g 2   gabapentin  (NEURONTIN ) 100 MG capsule TAKE 1 CAPSULE(100 MG) BY MOUTH THREE TIMES DAILY 180 capsule 3   glipiZIDE  (GLUCOTROL  XL) 10 MG 24 hr tablet TAKE 1 TABLET(10 MG) BY MOUTH DAILY WITH BREAKFAST 90 tablet 0   ipratropium-albuterol  (DUONEB) 0.5-2.5 (3) MG/3ML SOLN USE 3 ML VIA NEBULIZER EVERY 6 HOURS AS NEEDED 360 mL 1   ketoconazole  (NIZORAL ) 2 % cream Apply 1 Application topically daily. 15 g 0   levothyroxine  (SYNTHROID ) 75 MCG tablet TAKE 1 TABLET(75 MCG) BY MOUTH DAILY BEFORE BREAKFAST 90 tablet 3   metFORMIN  (GLUCOPHAGE ) 500 MG tablet TAKE 1 TABLET(500 MG) BY MOUTH TWICE DAILY WITH A MEAL 180 tablet 3   Multiple Vitamin (MULTIVITAMIN) capsule Take 1 capsule by mouth daily.     nystatin  cream (  MYCOSTATIN ) Apply 1 Application topically 2 (two) times daily. 30 g 5   predniSONE  (DELTASONE ) 5 MG tablet TAKE 1 TABLET(5 MG) BY MOUTH DAILY WITH BREAKFAST 90 tablet 1   No facility-administered medications prior to visit.     Per HPI unless specifically indicated in ROS section below Review of Systems  Constitutional:  Negative for fatigue and fever.  HENT:  Negative for congestion.   Eyes:  Negative for pain.  Respiratory:  Negative for cough and shortness of breath.   Cardiovascular:  Negative for chest pain, palpitations and leg swelling.  Gastrointestinal:  Negative for abdominal pain.  Genitourinary:  Negative for dysuria and vaginal bleeding.  Musculoskeletal:  Negative for back pain.  Neurological:  Negative for syncope, light-headedness and  headaches.  Psychiatric/Behavioral:  Negative for dysphoric mood.    Objective:  BP 130/80 (BP Location: Right Arm, Patient Position: Sitting, Cuff Size: Normal)   Pulse 73   Temp 98.5 F (36.9 C) (Temporal)   Ht 5' 2.5 (1.588 m)   Wt 180 lb 6 oz (81.8 kg)   SpO2 97%   BMI 32.47 kg/m   Wt Readings from Last 3 Encounters:  12/22/23 180 lb 6 oz (81.8 kg)  11/29/23 181 lb 3.2 oz (82.2 kg)  09/27/23 181 lb (82.1 kg)      Physical Exam Constitutional:      General: She is not in acute distress.    Appearance: She is well-developed. She is not ill-appearing or toxic-appearing.  HENT:     Head: Normocephalic.     Right Ear: Hearing, tympanic membrane, ear canal and external ear normal. Tympanic membrane is not erythematous, retracted or bulging.     Left Ear: Hearing, tympanic membrane, ear canal and external ear normal. Tympanic membrane is not erythematous, retracted or bulging.     Nose: Mucosal edema and rhinorrhea present.     Right Sinus: No maxillary sinus tenderness or frontal sinus tenderness.     Left Sinus: No maxillary sinus tenderness or frontal sinus tenderness.     Mouth/Throat:     Pharynx: Uvula midline.  Eyes:     General: Lids are normal. Lids are everted, no foreign bodies appreciated.     Conjunctiva/sclera: Conjunctivae normal.     Pupils: Pupils are equal, round, and reactive to light.  Neck:     Thyroid : No thyroid  mass or thyromegaly.     Vascular: No carotid bruit.     Trachea: Trachea normal.  Cardiovascular:     Rate and Rhythm: Normal rate and regular rhythm.     Pulses: Normal pulses.     Heart sounds: Normal heart sounds, S1 normal and S2 normal. No murmur heard.    No friction rub. No gallop.  Pulmonary:     Effort: Pulmonary effort is normal. No tachypnea or respiratory distress.     Breath sounds: Normal breath sounds. No decreased breath sounds, wheezing, rhonchi or rales.  Chest:     Chest wall: Tenderness present.  Breasts:    Breasts  are symmetrical.     Right: Normal. No skin change or tenderness.     Left: Normal. No skin change or tenderness.       Comments: Ttp over bilateral anterior chest wall , over costochondral junction as wel Musculoskeletal:     Cervical back: Normal range of motion and neck supple.  Lymphadenopathy:     Upper Body:     Right upper body: No supraclavicular or pectoral adenopathy.  Left upper body: No supraclavicular or pectoral adenopathy.  Skin:    General: Skin is warm and dry.     Findings: No rash.  Neurological:     Mental Status: She is alert.  Psychiatric:        Mood and Affect: Mood is not anxious or depressed.        Speech: Speech normal.        Behavior: Behavior normal. Behavior is cooperative.        Judgment: Judgment normal.       Results for orders placed or performed in visit on 10/09/23  Surgical pathology   Collection Time: 10/09/23 12:00 AM  Result Value Ref Range   SURGICAL PATHOLOGY      SURGICAL PATHOLOGY [facility] [address] [place], [place] [address] Telephone [phone] or [phone] Fax [phone]  REPORT OF DERMATOPATHOLOGY   Accession #: [id number] Patient Name: JANIENE, AARONS Visit # : [id number]  MRN: [id number] Cytotechnologist: Melia Rolla Wanda GORMAN., Dermatopathologist, Electronic Signature DOB/Age December 23, 1929 (Age: 54) Gender: F Collected Date: 10/09/2023 Received Date: 10/10/2023  FINAL DIAGNOSIS       1. Skin, left lower lateral leg :       SQUAMOUS CELL CARCINOMA, KERATOACANTHOMA TYPE       DATE SIGNED OUT: 10/13/2023 ELECTRONIC SIGNATURE : Melia Rolla, Wanda GORMAN., Dermatopathologist, Electronic Signature  MICROSCOPIC DESCRIPTION 1. There is a proliferation of atypical squamous epithelial cells with a crateriform pattern.  An inflammatory reaction is noted.  These are the features of a well differentiated squamous cell carcinoma, keratoacanthoma type.  CASE  COMMENTS STAINS USE D IN DIAGNOSIS: H&E H&E    CLINICAL HISTORY  SPECIMEN(S) OBTAINED 1. Skin, Left Lower Lateral Leg  SPECIMEN COMMENTS: 1. Pink ulcerated plaque 15mm SPECIMEN CLINICAL INFORMATION: 1. Neoplasm of uncertain behavior, R/O SCC    Gross Description 1. Formalin fixed specimen received:  13 X 12 X 4 MM, (5 P) (1 B) ( nn )=a All identifiable tissue submitted; apparent keratin debris/crust fragments left in container.      Additional formalin fixed specimen received in bottle:  10 X 6 X 2 MM, (4 P) (1      B) = b    ( nn )        Report signed out from the following location(s) [place] [address] [place], [place] [address] CLIA #: [id number]  [facility] [address] [place], [place] [address] CLIA #: [id number]     Assessment and Plan  There are no diagnoses linked to this encounter.  No follow-ups on file.   Greig Ring, MD

## 2023-12-22 NOTE — Assessment & Plan Note (Signed)
 Acute, most consistent with musculoskeletal strain.  Lung exam clear.  Recommended chest wall stretching, heat and we will treat with prednisone .  She will follow-up if it is not improving as expected.

## 2023-12-27 DIAGNOSIS — E113211 Type 2 diabetes mellitus with mild nonproliferative diabetic retinopathy with macular edema, right eye: Secondary | ICD-10-CM | POA: Diagnosis not present

## 2023-12-27 DIAGNOSIS — E113293 Type 2 diabetes mellitus with mild nonproliferative diabetic retinopathy without macular edema, bilateral: Secondary | ICD-10-CM | POA: Diagnosis not present

## 2023-12-27 DIAGNOSIS — M3501 Sicca syndrome with keratoconjunctivitis: Secondary | ICD-10-CM | POA: Diagnosis not present

## 2023-12-27 DIAGNOSIS — H353131 Nonexudative age-related macular degeneration, bilateral, early dry stage: Secondary | ICD-10-CM | POA: Diagnosis not present

## 2024-01-03 ENCOUNTER — Other Ambulatory Visit: Payer: Self-pay | Admitting: Family Medicine

## 2024-01-08 ENCOUNTER — Telehealth: Payer: Self-pay | Admitting: *Deleted

## 2024-01-08 DIAGNOSIS — E039 Hypothyroidism, unspecified: Secondary | ICD-10-CM

## 2024-01-08 DIAGNOSIS — E099 Drug or chemical induced diabetes mellitus without complications: Secondary | ICD-10-CM

## 2024-01-08 NOTE — Telephone Encounter (Signed)
-----   Message from Lovena Neighbours sent at 01/08/2024 11:05 AM EST ----- Regarding: Labs for Wednesday 3.12.25 Please put physical lab orders in future. Thank you, Denny Peon

## 2024-01-13 ENCOUNTER — Other Ambulatory Visit: Payer: Self-pay | Admitting: Podiatry

## 2024-01-24 ENCOUNTER — Other Ambulatory Visit: Payer: Medicare Other

## 2024-01-25 DIAGNOSIS — E113211 Type 2 diabetes mellitus with mild nonproliferative diabetic retinopathy with macular edema, right eye: Secondary | ICD-10-CM | POA: Diagnosis not present

## 2024-01-25 DIAGNOSIS — H353131 Nonexudative age-related macular degeneration, bilateral, early dry stage: Secondary | ICD-10-CM | POA: Diagnosis not present

## 2024-01-25 DIAGNOSIS — M3501 Sicca syndrome with keratoconjunctivitis: Secondary | ICD-10-CM | POA: Diagnosis not present

## 2024-01-25 DIAGNOSIS — E113293 Type 2 diabetes mellitus with mild nonproliferative diabetic retinopathy without macular edema, bilateral: Secondary | ICD-10-CM | POA: Diagnosis not present

## 2024-01-25 LAB — HM DIABETES EYE EXAM

## 2024-01-26 ENCOUNTER — Other Ambulatory Visit (INDEPENDENT_AMBULATORY_CARE_PROVIDER_SITE_OTHER)

## 2024-01-26 ENCOUNTER — Ambulatory Visit: Payer: Medicare Other

## 2024-01-26 VITALS — Ht 62.5 in | Wt 180.0 lb

## 2024-01-26 DIAGNOSIS — E039 Hypothyroidism, unspecified: Secondary | ICD-10-CM

## 2024-01-26 DIAGNOSIS — E099 Drug or chemical induced diabetes mellitus without complications: Secondary | ICD-10-CM | POA: Diagnosis not present

## 2024-01-26 DIAGNOSIS — Z0000 Encounter for general adult medical examination without abnormal findings: Secondary | ICD-10-CM | POA: Diagnosis not present

## 2024-01-26 DIAGNOSIS — T380X5D Adverse effect of glucocorticoids and synthetic analogues, subsequent encounter: Secondary | ICD-10-CM | POA: Diagnosis not present

## 2024-01-26 LAB — TSH: TSH: 2.95 u[IU]/mL (ref 0.35–5.50)

## 2024-01-26 LAB — COMPREHENSIVE METABOLIC PANEL
ALT: 13 U/L (ref 0–35)
AST: 19 U/L (ref 0–37)
Albumin: 4.2 g/dL (ref 3.5–5.2)
Alkaline Phosphatase: 58 U/L (ref 39–117)
BUN: 24 mg/dL — ABNORMAL HIGH (ref 6–23)
CO2: 28 meq/L (ref 19–32)
Calcium: 9.6 mg/dL (ref 8.4–10.5)
Chloride: 100 meq/L (ref 96–112)
Creatinine, Ser: 0.8 mg/dL (ref 0.40–1.20)
GFR: 64.34 mL/min (ref >60.00)
Glucose, Bld: 130 mg/dL — ABNORMAL HIGH (ref 70–99)
Potassium: 4.5 meq/L (ref 3.5–5.1)
Sodium: 139 meq/L (ref 135–145)
Total Bilirubin: 0.5 mg/dL (ref 0.2–1.2)
Total Protein: 6.6 g/dL (ref 6.0–8.3)

## 2024-01-26 LAB — LIPID PANEL
Cholesterol: 195 mg/dL (ref 0–200)
HDL: 60.3 mg/dL (ref >39.00)
LDL Cholesterol: 103 mg/dL — ABNORMAL HIGH (ref 0–99)
NonHDL: 134.28
Total CHOL/HDL Ratio: 3
Triglycerides: 157 mg/dL — ABNORMAL HIGH (ref 0.0–149.0)
VLDL: 31.4 mg/dL (ref 0.0–40.0)

## 2024-01-26 LAB — HEMOGLOBIN A1C: Hgb A1c MFr Bld: 7.3 % — ABNORMAL HIGH (ref 4.6–6.5)

## 2024-01-26 LAB — T3, FREE: T3, Free: 3.3 pg/mL (ref 2.3–4.2)

## 2024-01-26 LAB — T4, FREE: Free T4: 1.05 ng/dL (ref 0.60–1.60)

## 2024-01-26 NOTE — Progress Notes (Signed)
 No critical labs need to be addressed urgently. We will discuss labs in detail at upcoming office visit.

## 2024-01-26 NOTE — Patient Instructions (Signed)
 Ms. Megan Bradshaw , Thank you for taking time to come for your Medicare Wellness Visit. I appreciate your ongoing commitment to your health goals. Please review the following plan we discussed and let me know if I can assist you in the future.   Referrals/Orders/Follow-Ups/Clinician Recommendations: none  This is a list of the screening recommended for you and due dates:  Health Maintenance  Topic Date Due   DTaP/Tdap/Td vaccine (1 - Tdap) Never done   Zoster (Shingles) Vaccine (1 of 2) Never done   DEXA scan (bone density measurement)  Never done   Eye exam for diabetics  12/06/2022   COVID-19 Vaccine (4 - 2024-25 season) 07/16/2023   Complete foot exam   01/19/2024   Hemoglobin A1C  03/26/2024   Medicare Annual Wellness Visit  01/25/2025   Pneumonia Vaccine  Completed   Flu Shot  Completed   HPV Vaccine  Aged Out    Advanced directives: (Declined) Advance directive discussed with you today. Even though you declined this today, please call our office should you change your mind, and we can give you the proper paperwork for you to fill out.  Next Medicare Annual Wellness Visit scheduled for next year: Yes 01/28/25 @11 :30am televisit

## 2024-01-26 NOTE — Addendum Note (Signed)
 Addended by: Alvina Chou on: 01/26/2024 02:34 PM   Modules accepted: Orders

## 2024-01-26 NOTE — Progress Notes (Signed)
 Subjective:   Raeanne Deschler is a 89 y.o. who presents for a Medicare Wellness preventive visit.  Visit Complete: Virtual I connected with  [name] on 01/26/24 by a audio enabled telemedicine application and verified that I am speaking with the correct person using two identifiers.  Patient Location: Home  Provider Location: Office/Clinic  I discussed the limitations of evaluation and management by telemedicine. The patient expressed understanding and agreed to proceed.  Vital Signs: Because this visit was a virtual/telehealth visit, some criteria may be missing or patient reported. Any vitals not documented were not able to be obtained and vitals that have been documented are patient reported.  VideoDeclined- This patient declined Librarian, academic. Therefore the visit was completed with audio only.  Persons Participating in Visit: Patient.  AWV Questionnaire: No: Patient Medicare AWV questionnaire was not completed prior to this visit.  Cardiac Risk Factors include: advanced age (>68men, >78 women);obesity (BMI >30kg/m2)     Objective:    Today's Vitals   01/26/24 1129  Weight: 180 lb (81.6 kg)  Height: 5' 2.5" (1.588 m)   Body mass index is 32.4 kg/m.     01/26/2024   11:40 AM 07/26/2023   11:17 AM 01/18/2023    8:54 AM 01/04/2018    9:19 AM  Advanced Directives  Does Patient Have a Medical Advance Directive? No No No No  Would patient like information on creating a medical advance directive?  No - Patient declined No - Patient declined No - Patient declined    Current Medications (verified) Outpatient Encounter Medications as of 01/26/2024  Medication Sig   ACCU-CHEK GUIDE TEST test strip USE AS DIRECTED TO CHECK BLOOD SUGAR TWICE DAILY   Accu-Chek Softclix Lancets lancets Use to check blood sugar 1 times a day   albuterol (PROVENTIL) (2.5 MG/3ML) 0.083% nebulizer solution Take 3 mLs (2.5 mg total) by nebulization every 4 (four)  hours as needed for wheezing or shortness of breath.   albuterol (VENTOLIN HFA) 108 (90 Base) MCG/ACT inhaler INHALE 1 TO 2 PUFFS BY MOUTH EVERY 6 HOURS FOR PAIN OR WHEEZING OR SHORTNESS OF BREATH   B Complex-Folic Acid (BENFOTIAMINE MULTI-B) CAPS Take 1 capsule by mouth daily.   Blood Glucose Monitoring Suppl (ACCU-CHEK GUIDE ME) w/Device KIT See admin instructions.   BREO ELLIPTA 100-25 MCG/ACT AEPB Inhale 1 puff into the lungs daily.   cetirizine (ZYRTEC ALLERGY) 10 MG tablet Take 1 tablet (10 mg total) by mouth daily.   Cholecalciferol (VITAMIN D PO) Take 1 tablet by mouth daily.   clobetasol cream (TEMOVATE) 0.05 % Apply 1 Application topically daily as needed.   fluticasone (FLONASE) 50 MCG/ACT nasal spray Place 2 sprays into both nostrils daily.   gabapentin (NEURONTIN) 100 MG capsule TAKE 1 CAPSULE(100 MG) BY MOUTH THREE TIMES DAILY   glipiZIDE (GLUCOTROL XL) 10 MG 24 hr tablet TAKE 1 TABLET(10 MG) BY MOUTH DAILY WITH BREAKFAST   ipratropium-albuterol (DUONEB) 0.5-2.5 (3) MG/3ML SOLN USE 3 ML VIA NEBULIZER EVERY 6 HOURS AS NEEDED   ketoconazole (NIZORAL) 2 % cream Apply 1 Application topically daily.   levothyroxine (SYNTHROID) 75 MCG tablet TAKE 1 TABLET(75 MCG) BY MOUTH DAILY BEFORE BREAKFAST   metFORMIN (GLUCOPHAGE) 500 MG tablet TAKE 1 TABLET(500 MG) BY MOUTH TWICE DAILY WITH A MEAL   Multiple Vitamin (MULTIVITAMIN) capsule Take 1 capsule by mouth daily.   nystatin cream (MYCOSTATIN) Apply 1 Application topically 2 (two) times daily.   predniSONE (DELTASONE) 5 MG tablet TAKE 1  TABLET(5 MG) BY MOUTH DAILY WITH BREAKFAST   predniSONE (DELTASONE) 20 MG tablet 3 tabs by mouth daily x 3 days, then 2 tabs by mouth daily x 2 days then 1 tab by mouth daily x 2 days (Patient not taking: Reported on 01/26/2024)   No facility-administered encounter medications on file as of 01/26/2024.    Allergies (verified) Myrbetriq [mirabegron er] and Augmentin [amoxicillin-pot clavulanate]    History: Past Medical History:  Diagnosis Date   Actinic keratosis    Arthritis    Asthma    COPD (chronic obstructive pulmonary disease) (HCC)    Diabetes (HCC)    SCC (squamous cell carcinoma) 10/09/2023   left lower lateral leg, MOHs 11/03/2023   Thyroid disease    Past Surgical History:  Procedure Laterality Date   ABDOMINAL HYSTERECTOMY  1984   benign, TAH.   APPENDECTOMY  1939    CATARACT EXTRACTION, BILATERAL     CHOLECYSTECTOMY  1987   NASAL SINUS SURGERY  1990   REPLACEMENT TOTAL KNEE Right 2007   Family History  Problem Relation Age of Onset   Arthritis Father    Diabetes Sister    Diabetes Daughter    Cancer Neg Hx    Heart disease Neg Hx    Stroke Neg Hx    Social History   Socioeconomic History   Marital status: Widowed    Spouse name: Not on file   Number of children: 4   Years of education: Not on file   Highest education level: Not on file  Occupational History   Occupation: Retired  Tobacco Use   Smoking status: Former    Current packs/day: 0.00    Average packs/day: 1.5 packs/day for 30.0 years (45.0 ttl pk-yrs)    Types: Cigarettes    Start date: 11/14/1957    Quit date: 11/15/1987    Years since quitting: 36.2   Smokeless tobacco: Never  Vaping Use   Vaping status: Never Used  Substance and Sexual Activity   Alcohol use: No    Alcohol/week: 0.0 standard drinks of alcohol   Drug use: No   Sexual activity: Not on file  Other Topics Concern   Not on file  Social History Narrative   Not on file   Social Drivers of Health   Financial Resource Strain: Low Risk  (01/26/2024)   Overall Financial Resource Strain (CARDIA)    Difficulty of Paying Living Expenses: Not hard at all  Food Insecurity: No Food Insecurity (01/26/2024)   Hunger Vital Sign    Worried About Running Out of Food in the Last Year: Never true    Ran Out of Food in the Last Year: Never true  Transportation Needs: No Transportation Needs (01/26/2024)   PRAPARE -  Administrator, Civil Service (Medical): No    Lack of Transportation (Non-Medical): No  Physical Activity: Sufficiently Active (01/26/2024)   Exercise Vital Sign    Days of Exercise per Week: 5 days    Minutes of Exercise per Session: 30 min  Stress: No Stress Concern Present (01/26/2024)   [employer] of Occupational Health - Occupational Stress Questionnaire    Feeling of Stress : Not at all  Social Connections: Moderately Integrated (01/26/2024)   Social Connection and Isolation Panel [NHANES]    Frequency of Communication with Friends and Family: More than three times a week    Frequency of Social Gatherings with Friends and Family: More than three times a week    Attends Religious Services:  More than 4 times per year    Active Member of Clubs or Organizations: Yes    Attends Banker Meetings: More than 4 times per year    Marital Status: Widowed    Tobacco Counseling Counseling given: Not Answered    Clinical Intake:  Pre-visit preparation completed: Yes  Pain : No/denies pain     BMI - recorded: 32.4 Nutritional Status: BMI > 30  Obese Nutritional Risks: None Diabetes: Yes CBG done?: Yes (BS 97 at home this am) CBG resulted in Enter/ Edit results?: No Did pt. bring in CBG monitor from home?: No  How often do you need to have someone help you when you read instructions, pamphlets, or other written materials from your doctor or pharmacy?: 1 - Never  Interpreter Needed?: No  Comments: lives with son in law and g-daughter Information entered by :: B.Zyanya Glaza,LPN   Activities of Daily Living     01/26/2024   11:40 AM  In your present state of health, do you have any difficulty performing the following activities:  Hearing? 1  Vision? 0  Difficulty concentrating or making decisions? 0  Walking or climbing stairs? 0  Dressing or bathing? 0  Doing errands, shopping? 0  Preparing Food and eating ? N  Using the Toilet? N  In the  past six months, have you accidently leaked urine? Y  Do you have problems with loss of bowel control? N  Managing your Medications? N  Managing your Finances? N  Housekeeping or managing your Housekeeping? N    Patient Care Team: Excell Seltzer, MD as PCP - General (Family Medicine) Vilinda Flake, [facility] (Inactive) as Pharmacist (Pharmacist) Salena Saner, MD as Consulting Physician (Pulmonary Disease)  Indicate any recent Medical Services you may have received from other than [facility] providers in the past year (date may be approximate).     Assessment:   This is a routine wellness examination for [place].  Hearing/Vision screen Hearing Screening - Comments:: Pt says she needs hearing aids but can get by (due to cost) Vision Screening - Comments:: Pt says her vision is good w/glasses (far away vision) Dr Druscilla Brownie    Goals Addressed   None    Depression Screen     01/26/2024   11:38 AM 09/27/2023    9:13 AM 06/07/2023    9:32 AM 06/01/2023   11:37 AM 01/18/2023    8:53 AM 12/13/2022    8:54 AM 03/26/2021    9:30 AM  PHQ 2/9 Scores  PHQ - 2 Score 0 0 0 0 0 0 0  PHQ- 9 Score  0 4 0       Fall Risk     01/26/2024   11:32 AM 09/27/2023    9:12 AM 06/07/2023    9:32 AM 06/01/2023   11:37 AM 01/18/2023    8:55 AM  Fall Risk   Falls in the past year? 0 0 1 1 0  Number falls in past yr: 0 0 0 1 0  Injury with Fall? 0 0 1 1 0  Risk for fall due to : No Fall Risks No Fall Risks No Fall Risks History of fall(s) No Fall Risks  Follow up Education provided;Falls prevention discussed Falls evaluation completed Falls evaluation completed Falls evaluation completed Falls prevention discussed;Falls evaluation completed    MEDICARE RISK AT HOME:  Medicare Risk at Home Any stairs in or around the home?: No If so, are there any without handrails?: No Home free  of loose throw rugs in walkways, pet beds, electrical cords, etc?: Yes Adequate lighting in your home to reduce risk of  falls?: Yes Life alert?: No Use of a cane, walker or w/c?: No Grab bars in the bathroom?: Yes Shower chair or bench in shower?: Yes Elevated toilet seat or a handicapped toilet?: No  TIMED UP AND GO:  Was the test performed?  No  Cognitive Function: 6CIT completed        01/26/2024   12:07 PM 01/18/2023    8:56 AM  6CIT Screen  What Year? 0 points 0 points  What month? 0 points 0 points  What time? 0 points 0 points  Count back from 20 0 points 0 points  Months in reverse 0 points 0 points  Repeat phrase 0 points 2 points  Total Score 0 points 2 points    Immunizations Immunization History  Administered Date(s) Administered   Fluad Quad(high Dose 65+) 09/03/2020, 08/02/2021, 08/14/2022   Fluad Trivalent(High Dose 65+) 07/11/2023   Influenza,inj,Quad PF,6+ Mos 08/20/2015, 07/22/2016, 08/14/2017, 08/23/2018, 08/29/2019   Influenza-Unspecified 08/14/2014   PFIZER(Purple Top)SARS-COV-2 Vaccination 01/07/2020, 01/28/2020, 10/11/2020   Pneumococcal Conjugate-13 08/20/2015   Pneumococcal Polysaccharide-23 08/29/2019   Respiratory Syncytial Virus Vaccine,Recomb Aduvanted(Arexvy) 08/14/2022    Screening Tests Health Maintenance  Topic Date Due   DTaP/Tdap/Td (1 - Tdap) Never done   Zoster Vaccines- Shingrix (1 of 2) Never done   DEXA SCAN  Never done   OPHTHALMOLOGY EXAM  12/06/2022   COVID-19 Vaccine (4 - 2024-25 season) 07/16/2023   FOOT EXAM  01/19/2024   HEMOGLOBIN A1C  03/26/2024   Medicare Annual Wellness (AWV)  01/25/2025   Pneumonia Vaccine 99+ Years old  Completed   INFLUENZA VACCINE  Completed   HPV VACCINES  Aged Out    Health Maintenance  Health Maintenance Due  Topic Date Due   DTaP/Tdap/Td (1 - Tdap) Never done   Zoster Vaccines- Shingrix (1 of 2) Never done   DEXA SCAN  Never done   OPHTHALMOLOGY EXAM  12/06/2022   COVID-19 Vaccine (4 - 2024-25 season) 07/16/2023   FOOT EXAM  01/19/2024   Health Maintenance Items Addressed:none  needed   Additional Screening:  Vision Screening: Recommended annual ophthalmology exams for early detection of glaucoma and other disorders of the eye.  Dental Screening: Recommended annual dental exams for proper oral hygiene  Community Resource Referral / Chronic Care Management: CRR required this visit?  No   CCM required this visit?  No     Plan:     I have personally reviewed and noted the following in the patient's chart:   Medical and social history Use of alcohol, tobacco or illicit drugs  Current medications and supplements including opioid prescriptions. Patient is not currently taking opioid prescriptions. Functional ability and status Nutritional status Physical activity Advanced directives List of other physicians Hospitalizations, surgeries, and ER visits in previous 12 months Vitals Screenings to include cognitive, depression, and falls Referrals and appointments  In addition, I have reviewed and discussed with patient certain preventive protocols, quality metrics, and best practice recommendations. A written personalized care plan for preventive services as well as general preventive health recommendations were provided to patient.     Sue Lush, LPN   1/61/0960   After Visit Summary: (Declined) Due to this being a telephonic visit, with patients personalized plan was offered to patient but patient Declined AVS at this time   Notes: Nothing significant to report at this time.

## 2024-01-27 LAB — MICROALBUMIN / CREATININE URINE RATIO
Creatinine, Urine: 75 mg/dL (ref 20–275)
Microalb Creat Ratio: 8 mg/g{creat} (ref <30)
Microalb, Ur: 0.6 mg/dL

## 2024-01-29 NOTE — Progress Notes (Signed)
 No critical labs need to be addressed urgently. We will discuss labs in detail at upcoming office visit.

## 2024-01-31 ENCOUNTER — Encounter: Payer: Self-pay | Admitting: Family Medicine

## 2024-01-31 ENCOUNTER — Ambulatory Visit (INDEPENDENT_AMBULATORY_CARE_PROVIDER_SITE_OTHER): Payer: Medicare Other | Admitting: Family Medicine

## 2024-01-31 VITALS — BP 124/78 | HR 77 | Temp 97.6°F | Ht 63.8 in | Wt 175.8 lb

## 2024-01-31 DIAGNOSIS — J449 Chronic obstructive pulmonary disease, unspecified: Secondary | ICD-10-CM

## 2024-01-31 DIAGNOSIS — E1140 Type 2 diabetes mellitus with diabetic neuropathy, unspecified: Secondary | ICD-10-CM

## 2024-01-31 DIAGNOSIS — T380X5D Adverse effect of glucocorticoids and synthetic analogues, subsequent encounter: Secondary | ICD-10-CM

## 2024-01-31 DIAGNOSIS — M810 Age-related osteoporosis without current pathological fracture: Secondary | ICD-10-CM

## 2024-01-31 DIAGNOSIS — Z7984 Long term (current) use of oral hypoglycemic drugs: Secondary | ICD-10-CM

## 2024-01-31 DIAGNOSIS — Z0000 Encounter for general adult medical examination without abnormal findings: Secondary | ICD-10-CM | POA: Diagnosis not present

## 2024-01-31 DIAGNOSIS — E1169 Type 2 diabetes mellitus with other specified complication: Secondary | ICD-10-CM | POA: Insufficient documentation

## 2024-01-31 DIAGNOSIS — E039 Hypothyroidism, unspecified: Secondary | ICD-10-CM | POA: Diagnosis not present

## 2024-01-31 DIAGNOSIS — E099 Drug or chemical induced diabetes mellitus without complications: Secondary | ICD-10-CM

## 2024-01-31 DIAGNOSIS — E785 Hyperlipidemia, unspecified: Secondary | ICD-10-CM

## 2024-01-31 LAB — HM DIABETES FOOT EXAM

## 2024-01-31 NOTE — Patient Instructions (Signed)
 Please call the location of your choice from the menu below to schedule your Bone Density appointment.    [facility]   Phone:  [phone]   [address]                                                                            [place], [place] [address]                                            Services: 3D Mammogram and Bone Density

## 2024-01-31 NOTE — Progress Notes (Signed)
 Patient ID: Megan Bradshaw, female    DOB: 1930/04/30, 89 y.o.   MRN: [id number]  This visit was conducted in person.  BP 124/78   Pulse 77   Temp 97.6 F (36.4 C) (Oral)   Ht 5' 3.8" (1.621 m)   Wt 175 lb 12.8 oz (79.7 kg)   SpO2 97%   BMI 30.37 kg/m    CC:  Chief Complaint  Patient presents with   Annual Exam    Subjective:   HPI: Megan Bradshaw is a 89 y.o. female presenting on 01/31/2024 for Annual Exam  The patient presents for review of chronic health problems. She also has the following acute concerns today: none  The patient saw a LPN or RN for medicare wellness visit. 01/26/2024  Prevention and wellness was reviewed in detail. Note reviewed and important notes copied below.  Hypothyroidism: Stable on levothyroxine 75 mcg daily Lab Results  Component Value Date   TSH 2.95 01/26/2024   Diabetes, steroid-induced Tolerable control on metformin 500 mg daily, glipizide XL 10 mg p.o. daily Lab Results  Component Value Date   HGBA1C 7.3 (H) 01/26/2024  Using medications without difficulties: Hypoglycemic episodes: none Hyperglycemic episodes: none Feet problems: none, has neuropathy Blood Sugars averaging: FBS 92-130 eye exam within last year: due Associated with neuropathy: Moderate control on gabapentin 400 mg nightly  COPD stable: Followed by pulmonary on albuterol as needed, Breo inhaler and low-dose daily prednisone.    LDL > 100.. she refuses cholesterol medication.      Wt Readings from Last 3 Encounters:  01/31/24 175 lb 12.8 oz (79.7 kg)  01/26/24 180 lb (81.6 kg)  12/22/23 180 lb 6 oz (81.8 kg)     Relevant past medical, surgical, family and social history reviewed and updated as indicated. Interim medical history since our last visit reviewed. Allergies and medications reviewed and updated. Outpatient Medications Prior to Visit  Medication Sig Dispense Refill   ACCU-CHEK GUIDE TEST test strip USE AS DIRECTED TO CHECK BLOOD SUGAR TWICE  DAILY 200 strip 1   Accu-Chek Softclix Lancets lancets Use to check blood sugar 1 times a day 100 each 3   albuterol (PROVENTIL) (2.5 MG/3ML) 0.083% nebulizer solution Take 3 mLs (2.5 mg total) by nebulization every 4 (four) hours as needed for wheezing or shortness of breath. 150 mL 5   albuterol (VENTOLIN HFA) 108 (90 Base) MCG/ACT inhaler INHALE 1 TO 2 PUFFS BY MOUTH EVERY 6 HOURS FOR PAIN OR WHEEZING OR SHORTNESS OF BREATH 18 g 2   B Complex-Folic Acid (BENFOTIAMINE MULTI-B) CAPS Take 1 capsule by mouth daily. 30 capsule 6   Blood Glucose Monitoring Suppl (ACCU-CHEK GUIDE ME) w/Device KIT See admin instructions.     BREO ELLIPTA 100-25 MCG/ACT AEPB Inhale 1 puff into the lungs daily. 180 each 3   cetirizine (ZYRTEC ALLERGY) 10 MG tablet Take 1 tablet (10 mg total) by mouth daily. 30 tablet 11   Cholecalciferol (VITAMIN D PO) Take 1 tablet by mouth daily.     clobetasol cream (TEMOVATE) 0.05 % Apply 1 Application topically daily as needed. 30 g 1   fluticasone (FLONASE) 50 MCG/ACT nasal spray Place 2 sprays into both nostrils daily. 16 g 2   gabapentin (NEURONTIN) 100 MG capsule TAKE 1 CAPSULE(100 MG) BY MOUTH THREE TIMES DAILY 180 capsule 3   glipiZIDE (GLUCOTROL XL) 10 MG 24 hr tablet TAKE 1 TABLET(10 MG) BY MOUTH DAILY WITH BREAKFAST 90 tablet 0   ipratropium-albuterol (DUONEB) 0.5-2.5 (3) MG/3ML  SOLN USE 3 ML VIA NEBULIZER EVERY 6 HOURS AS NEEDED 360 mL 1   ketoconazole (NIZORAL) 2 % cream Apply 1 Application topically daily. 15 g 0   levothyroxine (SYNTHROID) 75 MCG tablet TAKE 1 TABLET(75 MCG) BY MOUTH DAILY BEFORE BREAKFAST 90 tablet 3   metFORMIN (GLUCOPHAGE) 500 MG tablet TAKE 1 TABLET(500 MG) BY MOUTH TWICE DAILY WITH A MEAL 180 tablet 3   Multiple Vitamin (MULTIVITAMIN) capsule Take 1 capsule by mouth daily.     nystatin cream (MYCOSTATIN) Apply 1 Application topically 2 (two) times daily. 30 g 5   predniSONE (DELTASONE) 20 MG tablet 3 tabs by mouth daily x 3 days, then 2 tabs by  mouth daily x 2 days then 1 tab by mouth daily x 2 days 15 tablet 0   predniSONE (DELTASONE) 5 MG tablet TAKE 1 TABLET(5 MG) BY MOUTH DAILY WITH BREAKFAST 90 tablet 1   No facility-administered medications prior to visit.     Per HPI unless specifically indicated in ROS section below Review of Systems  Constitutional:  Negative for fatigue and fever.  HENT:  Negative for congestion.   Eyes:  Negative for pain.  Respiratory:  Positive for shortness of breath. Negative for cough.   Cardiovascular:  Negative for chest pain, palpitations and leg swelling.  Gastrointestinal:  Negative for abdominal pain.  Genitourinary:  Negative for dysuria and vaginal bleeding.  Musculoskeletal:  Negative for back pain.  Neurological:  Negative for syncope, light-headedness and headaches.  Psychiatric/Behavioral:  Negative for dysphoric mood.     Objective:  BP 124/78   Pulse 77   Temp 97.6 F (36.4 C) (Oral)   Ht 5' 3.8" (1.621 m)   Wt 175 lb 12.8 oz (79.7 kg)   SpO2 97%   BMI 30.37 kg/m   Wt Readings from Last 3 Encounters:  01/31/24 175 lb 12.8 oz (79.7 kg)  01/26/24 180 lb (81.6 kg)  12/22/23 180 lb 6 oz (81.8 kg)      Physical Exam Constitutional:      General: She is not in acute distress.    Appearance: Normal appearance. She is well-developed. She is not ill-appearing or toxic-appearing.  HENT:     Head: Normocephalic.     Right Ear: Hearing, tympanic membrane, ear canal and external ear normal. Tympanic membrane is not erythematous, retracted or bulging.     Left Ear: Hearing, tympanic membrane, ear canal and external ear normal. Tympanic membrane is not erythematous, retracted or bulging.     Nose: No mucosal edema or rhinorrhea.     Right Sinus: No maxillary sinus tenderness or frontal sinus tenderness.     Left Sinus: No maxillary sinus tenderness or frontal sinus tenderness.     Mouth/Throat:     Mouth: Oral lesions present.     Pharynx: Uvula midline.     Comments:  Erythema at bilateral creases of lips Eyes:     General: Lids are normal. Lids are everted, no foreign bodies appreciated.     Conjunctiva/sclera: Conjunctivae normal.     Pupils: Pupils are equal, round, and reactive to light.  Neck:     Thyroid: No thyroid mass or thyromegaly.     Vascular: No carotid bruit.     Trachea: Trachea normal.  Cardiovascular:     Rate and Rhythm: Normal rate and regular rhythm.     Pulses: Normal pulses.     Heart sounds: Normal heart sounds, S1 normal and S2 normal. No murmur heard.  No friction rub. No gallop.  Pulmonary:     Effort: Pulmonary effort is normal. No tachypnea or respiratory distress.     Breath sounds: Normal breath sounds. No decreased breath sounds, wheezing, rhonchi or rales.  Abdominal:     General: Bowel sounds are normal.     Palpations: Abdomen is soft.     Tenderness: There is no abdominal tenderness.  Musculoskeletal:     Cervical back: Normal range of motion and neck supple.  Skin:    General: Skin is warm and dry.     Findings: No rash.  Neurological:     Mental Status: She is alert.  Psychiatric:        Mood and Affect: Mood is not anxious or depressed.        Speech: Speech normal.        Behavior: Behavior normal. Behavior is cooperative.        Thought Content: Thought content normal.        Judgment: Judgment normal.       Diabetic foot exam:  Abnormal inspection.. severe bunion and hammer toes bilaterally. No skin breakdown Pre ulcerative  calluses  Normal DP pulses  No sensation to light touch and monofilament Nails normal  Results for orders placed or performed in visit on 01/31/24  HM DIABETES FOOT EXAM   Collection Time: 01/31/24 12:00 AM  Result Value Ref Range   HM Diabetic Foot Exam done     Assessment and Plan The patient's preventative maintenance and recommended screening tests for an annual wellness exam were reviewed in full today. Brought up to date unless services  declined.  Counselled on the importance of diet, exercise, and its role in overall health and mortality. The patient's FH and SH was reviewed, including their home life, tobacco status, and drug and alcohol status.   Vaccines:   COVID x 3, PNA x 2, flu 2021, Td due, consider shingrix Pap/DVE:  Not indicated Mammo: not indicated Bone Density: DUE,  this year she is agreeable to set up... getting weight bearing exercise. Colon:  Not indicated given age Smoking Status: former, aged out of lung cancer screening CT program ETOH/ drug use: none/none  Hep C:  Done in past   Routine general medical examination at a health care facility  Acquired hypothyroidism Assessment & Plan: Stable, chronic.  Continue current medication.    levothyroxine 75 mcg daily   Steroid-induced diabetes mellitus, subsequent encounter [facility]) Assessment & Plan: Chronic, tolerable control , continue current medication regimen  metformin 500 mg BID and glipizide xl 10 mg daily  Associated with neuropathy.    COPD mixed type [facility]) Assessment & Plan: Chronic, no current COPD exacerbation. On nocturnal oxygen and Breo Ellipta. At baseline today.   Neuropathy due to type 2 diabetes mellitus (HCC) Assessment & Plan: Chronic, associated with diabetes, moderate control   Tolerable on 200 mg -300 mg  gabapentin  po qHS.  Can try trial of ALA  [id number] mg  daily  Preulcerative calluses , has diabetic shoes    Hyperlipidemia associated with type 2 diabetes mellitus (HCC) Assessment & Plan:  Chronic... Usually LDL < 100 but  recent check worse. Work on low cholesterol diet.  Reviewed diet.  Re-eval in 3 months.   Refuses statin.   Age-related osteoporosis without current pathological fracture -     DG Bone Density; Future     Return in about 6 months (around 08/02/2024) for diabetes follow up with   fasting labs  prior.   Kerby Nora, MD

## 2024-01-31 NOTE — Assessment & Plan Note (Signed)
 Chronic, no current COPD exacerbation. On nocturnal oxygen and Breo Ellipta. At baseline today.

## 2024-01-31 NOTE — Assessment & Plan Note (Addendum)
 Chronic... Usually LDL < 100 but  recent check worse. Work on low cholesterol diet.  Reviewed diet.  Re-eval in 3 months.   Refuses statin.

## 2024-01-31 NOTE — Assessment & Plan Note (Signed)
 Chronic, associated with diabetes, moderate control   Tolerable on 200 mg -300 mg  gabapentin  po qHS.  Can try trial of ALA  [id number] mg  daily  Preulcerative calluses , has diabetic shoes

## 2024-01-31 NOTE — Assessment & Plan Note (Signed)
Stable, chronic.  Continue current medication.    levothyroxine 75 mcg daily 

## 2024-01-31 NOTE — Assessment & Plan Note (Signed)
 Chronic, tolerable control , continue current medication regimen  metformin 500 mg BID and glipizide xl 10 mg daily  Associated with neuropathy.

## 2024-02-05 ENCOUNTER — Ambulatory Visit (INDEPENDENT_AMBULATORY_CARE_PROVIDER_SITE_OTHER): Payer: Medicare Other | Admitting: Dermatology

## 2024-02-05 DIAGNOSIS — Z1283 Encounter for screening for malignant neoplasm of skin: Secondary | ICD-10-CM

## 2024-02-05 DIAGNOSIS — D1721 Benign lipomatous neoplasm of skin and subcutaneous tissue of right arm: Secondary | ICD-10-CM

## 2024-02-05 DIAGNOSIS — L578 Other skin changes due to chronic exposure to nonionizing radiation: Secondary | ICD-10-CM | POA: Diagnosis not present

## 2024-02-05 DIAGNOSIS — Z872 Personal history of diseases of the skin and subcutaneous tissue: Secondary | ICD-10-CM | POA: Diagnosis not present

## 2024-02-05 DIAGNOSIS — D1801 Hemangioma of skin and subcutaneous tissue: Secondary | ICD-10-CM

## 2024-02-05 DIAGNOSIS — Z85828 Personal history of other malignant neoplasm of skin: Secondary | ICD-10-CM | POA: Diagnosis not present

## 2024-02-05 DIAGNOSIS — L814 Other melanin hyperpigmentation: Secondary | ICD-10-CM

## 2024-02-05 DIAGNOSIS — L821 Other seborrheic keratosis: Secondary | ICD-10-CM | POA: Diagnosis not present

## 2024-02-05 DIAGNOSIS — D692 Other nonthrombocytopenic purpura: Secondary | ICD-10-CM | POA: Diagnosis not present

## 2024-02-05 DIAGNOSIS — L853 Xerosis cutis: Secondary | ICD-10-CM | POA: Diagnosis not present

## 2024-02-05 DIAGNOSIS — L820 Inflamed seborrheic keratosis: Secondary | ICD-10-CM

## 2024-02-05 DIAGNOSIS — D229 Melanocytic nevi, unspecified: Secondary | ICD-10-CM

## 2024-02-05 DIAGNOSIS — W908XXA Exposure to other nonionizing radiation, initial encounter: Secondary | ICD-10-CM

## 2024-02-05 NOTE — Progress Notes (Signed)
 Follow-Up Visit   Subjective  Megan Bradshaw is a 89 y.o. female who presents for the following: Skin Cancer Screening and Full Body Skin Exam Left lower leg hx of scc treated with Mohs. Hx of aks, hx of isks, hx of lipoma at right forearm   The patient presents for Total-Body Skin Exam (TBSE) for skin cancer screening and mole check. The patient has spots, moles and lesions to be evaluated, some may be new or changing and the patient may have concern these could be cancer. She has irritated growth on left neck, was frozen before, smaller but still there    The following portions of the chart were reviewed this encounter and updated as appropriate: medications, allergies, medical history  Review of Systems:  No other skin or systemic complaints except as noted in HPI or Assessment and Plan.  Objective  Well appearing patient in no apparent distress; mood and affect are within normal limits.  A full examination was performed including scalp, head, eyes, ears, nose, lips, neck, chest, axillae, abdomen, back, buttocks, bilateral upper extremities, bilateral lower extremities, hands, feet, fingers, toes, fingernails, and toenails. All findings within normal limits unless otherwise noted below.   Relevant physical exam findings are noted in the Assessment and Plan.  left lower neck x 1 Erythematous stuck-on, waxy papule or plaque  Assessment & Plan   SKIN CANCER SCREENING PERFORMED TODAY.  ACTINIC DAMAGE - Chronic condition, secondary to cumulative UV/sun exposure - diffuse scaly erythematous macules with underlying dyspigmentation - Recommend daily broad spectrum sunscreen SPF 30+ to sun-exposed areas, reapply every 2 hours as needed.  - Staying in the shade or wearing long sleeves, sun glasses (UVA+UVB protection) and wide brim hats (4-inch brim around the entire circumference of the hat) are also recommended for sun protection.  - Call for new or changing lesions.  LENTIGINES,  SEBORRHEIC KERATOSES, HEMANGIOMAS - Benign normal skin lesions - Benign-appearing - Call for any changes  MELANOCYTIC NEVI - Tan-brown and/or pink-flesh-colored symmetric macules and papules - Benign appearing on exam today - Observation - Call clinic for new or changing moles - Recommend daily use of broad spectrum spf 30+ sunscreen to sun-exposed areas.   Purpura - Chronic; persistent and recurrent.  Treatable, but not curable. - Violaceous macules and patches - Benign - Related to trauma, age, sun damage and/or use of blood thinners, chronic use of topical and/or oral steroids - Observe - Can use OTC arnica containing moisturizer such as Dermend Bruise Formula if desired - Call for worsening or other concerns   Xerosis - diffuse xerotic patches - recommend gentle, hydrating skin care - gentle skin care handout given  Lipoma  Exam: Subcutaneous rubbery nodule  Location: R forearm   Benign-appearing. Exam most consistent with a lipoma. Discussed that a lipoma is a benign fatty growth that can grow over time and sometimes get irritated. Recommend observation if it is not bothersome or changing. Discussed option of ILK injections or surgical excision to remove it if it is growing, symptomatic, or other changes noted. Please call for new or changing lesions so they can be evaluated.    HISTORY OF SQUAMOUS CELL CARCINOMA OF THE SKIN 10/09/2023 Left lateral lower leg, Mohs 11/03/2023 - No evidence of recurrence today - Recommend regular full body skin exams - Recommend daily broad spectrum sunscreen SPF 30+ to sun-exposed areas, reapply every 2 hours as needed.  - Call if any new or changing lesions are noted between office visits  INFLAMED SEBORRHEIC KERATOSIS left lower neck x 1 Symptomatic, irritating, patient would like treated.  2nd treatment today  Destruction of lesion - left lower neck x 1  Destruction method: cryotherapy   Informed consent: discussed and consent  obtained   Lesion destroyed using liquid nitrogen: Yes   Region frozen until ice ball extended beyond lesion: Yes   Outcome: patient tolerated procedure well with no complications   Post-procedure details: wound care instructions given   Additional details:  Prior to procedure, discussed risks of blister formation, small wound, skin dyspigmentation, or rare scar following cryotherapy. Recommend Vaseline ointment to treated areas while healing.  Return in about 6 months (around 08/07/2024) for isk , aks check, 1 year tbse .  I, Asher Muir, CMA, am acting as scribe for [name], MD.   Documentation: I have reviewed the above documentation for accuracy and completeness, and I agree with the above.  [name], MD

## 2024-02-05 NOTE — Patient Instructions (Addendum)
 Cryotherapy Aftercare  Wash gently with soap and water everyday.   Apply Vaseline and Band-Aid daily until healed.   Seborrheic Keratosis  What causes seborrheic keratoses? Seborrheic keratoses are harmless, common skin growths that first appear during adult life.  As time goes by, more growths appear.  Some people may develop a large number of them.  Seborrheic keratoses appear on both covered and uncovered body parts.  They are not caused by sunlight.  The tendency to develop seborrheic keratoses can be inherited.  They vary in color from skin-colored to gray, brown, or even black.  They can be either smooth or have a rough, warty surface.   Seborrheic keratoses are superficial and look as if they were stuck on the skin.  Under the microscope this type of keratosis looks like layers upon layers of skin.  That is why at times the top layer may seem to fall off, but the rest of the growth remains and re-grows.    Treatment Seborrheic keratoses do not need to be treated, but can easily be removed in the office.  Seborrheic keratoses often cause symptoms when they rub on clothing or jewelry.  Lesions can be in the way of shaving.  If they become inflamed, they can cause itching, soreness, or burning.  Removal of a seborrheic keratosis can be accomplished by freezing, burning, or surgery. If any spot bleeds, scabs, or grows rapidly, please return to have it checked, as these can be an indication of a skin cancer.   Gentle Skin Care Guide  1. Bathe no more than once a day.  2. Avoid bathing in hot water  3. Use a mild soap like Dove, Vanicream, Cetaphil, CeraVe. Can use Lever 2000 or Cetaphil antibacterial soap  4. Use soap only where you need it. On most days, use it under your arms, between your legs, and on your feet. Let the water rinse other areas unless visibly dirty.  5. When you get out of the bath/shower, use a towel to gently blot your skin dry, don't rub it.  6. While your skin  is still a little damp, apply a moisturizing cream such as Vanicream, CeraVe, Cetaphil, Eucerin, Sarna lotion or plain Vaseline Jelly. For hands apply Neutrogena [place] Hand Cream or Excipial Hand Cream.  7. Reapply moisturizer any time you start to itch or feel dry.  8. Sometimes using free and clear laundry detergents can be helpful. Fabric softener sheets should be avoided. Downy Free & Gentle liquid, or any liquid fabric softener that is free of dyes and perfumes, it acceptable to use  9. If your doctor has given you prescription creams you may apply moisturizers over them      Melanoma ABCDEs  Melanoma is the most dangerous type of skin cancer, and is the leading cause of death from skin disease.  You are more likely to develop melanoma if you: Have light-colored skin, light-colored eyes, or red or blond hair Spend a lot of time in the sun Tan regularly, either outdoors or in a tanning bed Have had blistering sunburns, especially during childhood Have a close family member who has had a melanoma Have atypical moles or large birthmarks  Early detection of melanoma is key since treatment is typically straightforward and cure rates are extremely high if we catch it early.   The first sign of melanoma is often a change in a mole or a new dark spot.  The ABCDE system is a way of remembering the signs of  melanoma.  A for asymmetry:  The two halves do not match. B for border:  The edges of the growth are irregular. C for color:  A mixture of colors are present instead of an even brown color. D for diameter:  Melanomas are usually (but not always) greater than 6mm - the size of a pencil eraser. E for evolution:  The spot keeps changing in size, shape, and color.  Please check your skin once per month between visits. You can use a small mirror in front and a large mirror behind you to keep an eye on the back side or your body.   If you see any new or changing lesions before your next  follow-up, please call to schedule a visit.  Please continue daily skin protection including broad spectrum sunscreen SPF 30+ to sun-exposed areas, reapplying every 2 hours as needed when you're outdoors.   Staying in the shade or wearing long sleeves, sun glasses (UVA+UVB protection) and wide brim hats (4-inch brim around the entire circumference of the hat) are also recommended for sun protection.    Due to recent changes in healthcare laws, you may see results of your pathology and/or laboratory studies on MyChart before the doctors have had a chance to review them. We understand that in some cases there may be results that are confusing or concerning to you. Please understand that not all results are received at the same time and often the doctors may need to interpret multiple results in order to provide you with the best plan of care or course of treatment. Therefore, we ask that you please give [place] 2 business days to thoroughly review all your results before contacting the office for clarification. Should we see a critical lab result, you will be contacted sooner.   If You Need Anything After Your Visit  If you have any questions or concerns for your doctor, please call our main line at [phone] and press option 4 to reach your doctor's medical assistant. If no one answers, please leave a voicemail as directed and we will return your call as soon as possible. Messages left after 4 pm will be answered the following business day.   You may also send [place] a message via MyChart. We typically respond to MyChart messages within 1-2 business days.  For prescription refills, please ask your pharmacy to contact our office. Our fax number is [phone].  If you have an urgent issue when the clinic is closed that cannot wait until the next business day, you can page your doctor at the number below.    Please note that while we do our best to be available for urgent issues outside of office hours,  we are not available 24/7.   If you have an urgent issue and are unable to reach [place], you may choose to seek medical care at your doctor's office, retail clinic, urgent care center, or emergency room.  If you have a medical emergency, please immediately call 911 or go to the emergency department.  Pager Numbers  - Dr. Gwen Pounds: [phone]  - Dr. Roseanne Reno: [phone]  - Dr. Katrinka Blazing: [phone]   In the event of inclement weather, please call our main line at [phone] for an update on the status of any delays or closures.  Dermatology Medication Tips: Please keep the boxes that topical medications come in in order to help keep track of the instructions about where and how to use these. Pharmacies typically print the medication instructions only on  the boxes and not directly on the medication tubes.   If your medication is too expensive, please contact our office at [phone] option 4 or send [place] a message through MyChart.   We are unable to tell what your co-pay for medications will be in advance as this is different depending on your insurance coverage. However, we may be able to find a substitute medication at lower cost or fill out paperwork to get insurance to cover a needed medication.   If a prior authorization is required to get your medication covered by your insurance company, please allow [place] 1-2 business days to complete this process.  Drug prices often vary depending on where the prescription is filled and some pharmacies may offer cheaper prices.  The website www.goodrx.com contains coupons for medications through different pharmacies. The prices here do not account for what the cost may be with help from insurance (it may be cheaper with your insurance), but the website can give you the price if you did not use any insurance.  - You can print the associated coupon and take it with your prescription to the pharmacy.  - You may also stop by our office during regular  business hours and pick up a GoodRx coupon card.  - If you need your prescription sent electronically to a different pharmacy, notify our office through [facility] or by phone at [phone] option 4.     Si Usted Necesita Algo Despus de Su Visita  Tambin puede enviarnos un mensaje a travs de Clinical cytogeneticist. Por lo general respondemos a los mensajes de MyChart en el transcurso de 1 a 2 das hbiles.  Para renovar recetas, por favor pida a su farmacia que se ponga en contacto con nuestra oficina. Annie Sable de fax es [place] [phone].  Si tiene un asunto urgente cuando la clnica est cerrada y que no puede esperar hasta el siguiente da hbil, puede llamar/localizar a su doctor(a) al nmero que aparece a continuacin.   Por favor, tenga en cuenta que aunque hacemos todo lo posible para estar disponibles para asuntos urgentes fuera del horario de [place], no estamos disponibles las 24 horas del da, los 7 [address]  [address] de la [place].   Si tiene un problema urgente y no puede comunicarse con nosotros, puede optar por buscar atencin mdica  en el consultorio de su doctor(a), en una clnica privada, en un centro de atencin urgente o en una sala de emergencias.  Si tiene Engineer, drilling, por favor llame inmediatamente al 911 o vaya a la sala de emergencias.  Nmeros de bper  - Dr. Gwen Pounds: [phone]  - Dra. Roseanne Reno: [phone]  - Dr. Katrinka Blazing: [phone]   En caso de inclemencias del tiempo, por favor llame a Lacy Duverney principal al [phone] para una actualizacin sobre el [place] de cualquier retraso o cierre.  Consejos para la medicacin en dermatologa: Por favor, guarde las cajas en las que vienen los medicamentos de uso tpico para ayudarle a seguir las instrucciones sobre dnde y cmo usarlos. Las farmacias generalmente imprimen las instrucciones del medicamento slo en las cajas y no directamente en los tubos del [place].   Si su medicamento es muy caro, por  favor, pngase en contacto con Rolm Gala llamando al [phone] y presione la opcin 4 o envenos un mensaje a travs de Clinical cytogeneticist.   No podemos decirle cul ser su copago por los medicamentos por adelantado ya que esto es diferente dependiendo de la cobertura de su seguro. Sin embargo, es posible  que podamos encontrar un medicamento sustituto a Audiological scientist un formulario para que el seguro cubra el medicamento que se considera necesario.   Si se requiere una autorizacin previa para que su compaa de seguros [place] su medicamento, por favor permtanos de 1 a 2 das hbiles para completar [address]  Los precios de los medicamentos varan con frecuencia dependiendo del Environmental consultant de dnde se surte la receta y alguna farmacias pueden ofrecer precios ms baratos.  El sitio web www.goodrx.com tiene cupones para medicamentos de Health and safety inspector. Los precios aqu no tienen en cuenta lo que podra costar con la ayuda del seguro (puede ser ms barato con su seguro), pero el sitio web puede darle el precio si no utiliz Tourist information centre manager.  - Puede imprimir el cupn correspondiente y llevarlo con su receta a la farmacia.  - Tambin puede pasar por nuestra oficina durante el horario de atencin regular y Education officer, museum una tarjeta de cupones de GoodRx.  - Si necesita que su receta se enve electrnicamente a una farmacia diferente, informe a nuestra oficina a travs de MyChart de [place] o por telfono llamando al [phone] y presione la opcin 4.

## 2024-02-12 ENCOUNTER — Other Ambulatory Visit: Payer: Self-pay | Admitting: Family Medicine

## 2024-03-11 ENCOUNTER — Other Ambulatory Visit: Payer: Self-pay | Admitting: Family Medicine

## 2024-03-11 ENCOUNTER — Ambulatory Visit
Admission: RE | Admit: 2024-03-11 | Discharge: 2024-03-11 | Disposition: A | Discharge status: Home / Self Care | Source: Ambulatory Visit | Attending: Family Medicine | Admitting: Family Medicine

## 2024-03-11 DIAGNOSIS — M810 Age-related osteoporosis without current pathological fracture: Secondary | ICD-10-CM | POA: Insufficient documentation

## 2024-03-11 DIAGNOSIS — Z780 Asymptomatic menopausal state: Secondary | ICD-10-CM | POA: Diagnosis not present

## 2024-03-11 NOTE — Telephone Encounter (Signed)
 Last office visit 01/31/2024 for CPE.  Last refilled 12/22/2023 for #15 with no refills.  Next Appt: No future appointments with PCP.

## 2024-03-13 ENCOUNTER — Encounter: Payer: Self-pay | Admitting: Family Medicine

## 2024-03-13 ENCOUNTER — Telehealth: Payer: Self-pay

## 2024-03-13 ENCOUNTER — Ambulatory Visit (INDEPENDENT_AMBULATORY_CARE_PROVIDER_SITE_OTHER): Admitting: Family Medicine

## 2024-03-13 VITALS — BP 122/62 | HR 96 | Temp 97.6°F | Resp 79 | Ht 63.8 in | Wt 175.0 lb

## 2024-03-13 DIAGNOSIS — M810 Age-related osteoporosis without current pathological fracture: Secondary | ICD-10-CM | POA: Insufficient documentation

## 2024-03-13 MED ORDER — DENOSUMAB 60 MG/ML ~~LOC~~ SOSY
60.0000 mg | PREFILLED_SYRINGE | Freq: Once | SUBCUTANEOUS | Status: AC
Start: 2024-03-27 — End: 2024-04-10
  Administered 2024-04-10: 60 mg via SUBCUTANEOUS

## 2024-03-13 NOTE — Assessment & Plan Note (Addendum)
 Bone density shows T-score of  -2.5. Reviewed medications that can help boost bone strength to reduce  risk of fractures primarily in the spine and hip.   She has GERD and is in PPI and is not able to stop. She is on chronic steroids and  bone density will likely worsen over time.  She  does weight bearing exercise regularly.   We will check vit D to determine if we need to start additional supplementation.   She would prefer Prolia trial to bisphosphonate. Reviewed med SE, use and  course.  Info provided to Megan Bradshaw and out Prolia team to begin approval process.

## 2024-03-13 NOTE — Progress Notes (Signed)
 Patient ID: Megan Bradshaw, female    DOB: March 06, 1930, 89 y.o.   MRN: [id number]  This visit was conducted in person.  BP 122/62   Pulse 96   Temp 97.6 F (36.4 C) (Oral)   Resp (!) 79   Ht 5' 3.8" (1.621 m)   Wt 175 lb (79.4 kg)   BMI 30.23 kg/m    CC:  Chief Complaint  Patient presents with   Medical Management of Chronic Issues    Osteoporosis     Subjective:   HPI: Megan Bradshaw is a 89 y.o. female presenting on 03/13/2024 for Medical Management of Chronic Issues (Osteoporosis )   Recent DEXA.Aaron Aas.  new osteoporosis T -2.5 in hip. Likely due to chronic prednisone  use.  Last labs 01/26/2024 Cr 0.8, ca 9.6, vit D3  No recent Vit D... taking vit D3 1 international Units  daily   30 min on treadmill daily, plans water aerobic.     Uses  omeprazole daily for reflux.  She prefers trial of prolia as opposed to bisphosphonate. She has not used bisphosphonate before.  Relevant past medical, surgical, family and social history reviewed and updated as indicated. Interim medical history since our last visit reviewed. Allergies and medications reviewed adaily nd updated. Outpatient Medications Prior to Visit  Medication Sig Dispense Refill   ACCU-CHEK GUIDE TEST test strip USE AS DIRECTED TO CHECK BLOOD SUGAR TWICE DAILY 200 strip 1   Accu-Chek Softclix Lancets lancets Use to check blood sugar 1 times a day 100 each 3   albuterol  (PROVENTIL ) (2.5 MG/3ML) 0.083% nebulizer solution Take 3 mLs (2.5 mg total) by nebulization every 4 (four) hours as needed for wheezing or shortness of breath. 150 mL 5   albuterol  (VENTOLIN  HFA) 108 (90 Base) MCG/ACT inhaler INHALE 1 TO 2 PUFFS BY MOUTH EVERY 6 HOURS FOR PAIN OR WHEEZING OR SHORTNESS OF BREATH 18 g 2   B Complex-Folic Acid (BENFOTIAMINE MULTI-B) CAPS Take 1 capsule by mouth daily. 30 capsule 6   Blood Glucose Monitoring Suppl (ACCU-CHEK GUIDE ME) w/Device KIT See admin instructions.     BREO ELLIPTA  100-25 MCG/ACT AEPB Inhale 1 puff  into the lungs daily. 180 each 3   cetirizine  (ZYRTEC  ALLERGY) 10 MG tablet Take 1 tablet (10 mg total) by mouth daily. 30 tablet 11   Cholecalciferol (VITAMIN D  PO) Take 1 tablet by mouth daily.     clobetasol  cream (TEMOVATE ) 0.05 % Apply 1 Application topically daily as needed. 30 g 1   fluticasone  (FLONASE ) 50 MCG/ACT nasal spray Place 2 sprays into both nostrils daily. 16 g 2   gabapentin  (NEURONTIN ) 100 MG capsule TAKE 1 CAPSULE(100 MG) BY MOUTH THREE TIMES DAILY 180 capsule 3   glipiZIDE  (GLUCOTROL  XL) 10 MG 24 hr tablet TAKE 1 TABLET(10 MG) BY MOUTH DAILY WITH BREAKFAST 90 tablet 1   ipratropium-albuterol  (DUONEB) 0.5-2.5 (3) MG/3ML SOLN USE 3 ML VIA NEBULIZER EVERY 6 HOURS AS NEEDED 360 mL 1   ketoconazole  (NIZORAL ) 2 % cream Apply 1 Application topically daily. 15 g 0   levothyroxine  (SYNTHROID ) 75 MCG tablet TAKE 1 TABLET(75 MCG) BY MOUTH DAILY BEFORE BREAKFAST 90 tablet 3   metFORMIN  (GLUCOPHAGE ) 500 MG tablet TAKE 1 TABLET(500 MG) BY MOUTH TWICE DAILY WITH A MEAL 180 tablet 3   Multiple Vitamin (MULTIVITAMIN) capsule Take 1 capsule by mouth daily.     nystatin  cream (MYCOSTATIN ) Apply 1 Application topically 2 (two) times daily. 30 g 5   predniSONE  (DELTASONE ) 5 MG  tablet TAKE 1 TABLET(5 MG) BY MOUTH DAILY WITH BREAKFAST 90 tablet 1   predniSONE  (DELTASONE ) 20 MG tablet 3 tabs by mouth daily x 3 days, then 2 tabs by mouth daily x 2 days then 1 tab by mouth daily x 2 days 15 tablet 0   No facility-administered medications prior to visit.     Per HPI unless specifically indicated in ROS section below Review of Systems  Constitutional:  Negative for fatigue and fever.  HENT:  Negative for congestion.   Eyes:  Negative for pain.  Respiratory:  Positive for shortness of breath. Negative for cough.   Cardiovascular:  Negative for chest pain, palpitations and leg swelling.  Gastrointestinal:  Negative for abdominal pain.  Genitourinary:  Negative for dysuria and vaginal bleeding.   Musculoskeletal:  Negative for back pain.  Neurological:  Negative for syncope, light-headedness and headaches.  Psychiatric/Behavioral:  Negative for dysphoric mood.    Objective:  BP 122/62   Pulse 96   Temp 97.6 F (36.4 C) (Oral)   Resp (!) 79   Ht 5' 3.8" (1.621 m)   Wt 175 lb (79.4 kg)   BMI 30.23 kg/m   Wt Readings from Last 3 Encounters:  03/13/24 175 lb (79.4 kg)  01/31/24 175 lb 12.8 oz (79.7 kg)  01/26/24 180 lb (81.6 kg)      Physical Exam Constitutional:      General: She is not in acute distress.    Appearance: Normal appearance. She is well-developed. She is not ill-appearing or toxic-appearing.  HENT:     Head: Normocephalic.     Right Ear: Hearing, tympanic membrane, ear canal and external ear normal.     Left Ear: Hearing, tympanic membrane, ear canal and external ear normal.     Nose: Nose normal.  Eyes:     General: Lids are normal. Lids are everted, no foreign bodies appreciated.     Conjunctiva/sclera: Conjunctivae normal.     Pupils: Pupils are equal, round, and reactive to light.  Neck:     Thyroid : No thyroid  mass or thyromegaly.     Vascular: No carotid bruit.     Trachea: Trachea normal.  Cardiovascular:     Rate and Rhythm: Normal rate and regular rhythm.     Heart sounds: Normal heart sounds, S1 normal and S2 normal. No murmur heard.    No gallop.  Pulmonary:     Effort: Pulmonary effort is normal. No respiratory distress.     Breath sounds: Normal breath sounds. No wheezing, rhonchi or rales.  Abdominal:     General: Bowel sounds are normal. There is no distension or abdominal bruit.     Palpations: Abdomen is soft. There is no fluid wave or mass.     Tenderness: There is no abdominal tenderness. There is no guarding or rebound.     Hernia: No hernia is present.  Musculoskeletal:     Cervical back: Normal range of motion and neck supple.  Lymphadenopathy:     Cervical: No cervical adenopathy.  Skin:    General: Skin is warm and  dry.     Findings: No rash.  Neurological:     Mental Status: She is alert.     Cranial Nerves: No cranial nerve deficit.     Sensory: No sensory deficit.  Psychiatric:        Mood and Affect: Mood is not anxious or depressed.        Speech: Speech normal.  Behavior: Behavior normal. Behavior is cooperative.        Judgment: Judgment normal.       Results for orders placed or performed in visit on 01/31/24  HM DIABETES EYE EXAM   Collection Time: 01/25/24 11:16 AM  Result Value Ref Range   HM Diabetic Eye Exam Retinopathy (A) No Retinopathy    Assessment and Plan  Age-related osteoporosis without current pathological fracture Assessment & Plan:  Bone density shows T-score of  -2.5. Reviewed medications that can help boost bone strength to reduce  risk of fractures primarily in the spine and hip.   She has GERD and is in PPI and is not able to stop. She is on chronic steroids and  bone density will likely worsen over time.  She  does weight bearing exercise regularly.   We will check vit D to determine if we need to start additional supplementation.   She would prefer Prolia trial to bisphosphonate. Reviewed med SE, use and  course.  Info provided to Joellen and out Prolia team to begin approval process.  Orders: -     Denosumab -     VITAMIN D  25 Hydroxy (Vit-D Deficiency, Fractures); Future    No follow-ups on file.   Herby Lolling, MD

## 2024-03-13 NOTE — Telephone Encounter (Signed)
 Prolia VOB initiated via [email or web address]  Next Prolia inj DUE: NEW START

## 2024-03-16 ENCOUNTER — Other Ambulatory Visit: Payer: Self-pay | Admitting: Family Medicine

## 2024-03-20 NOTE — Telephone Encounter (Signed)
 Pt ready for scheduling for PROLIA  on or after : 03/20/24  Option# 1: Buy/Bill (Office supplied medication)  Out-of-pocket cost due at time of clinic visit: $0  Number of injection/visits approved: ---  Primary: MEDICARE Prolia  co-insurance: 0% Admin fee co-insurance: 0%  Secondary: [place] Prolia  co-insurance:  Admin fee co-insurance:   Medical Benefit Details: Date Benefits were checked: 03/15/24 Deductible: $257 Met of $257 Required/ Coinsurance: 0%/ Admin Fee: 0%  Prior Auth: N/A PA# Expiration Date:   # of doses approved: ----------------------------------------------------------------------- Option# 2- Med Obtained from pharmacy:  Pharmacy benefit: Copay $--- (Paid to pharmacy) Admin Fee: --- (Pay at clinic)  Prior Auth: --- PA# Expiration Date:   # of doses approved:   If patient wants fill through the pharmacy benefit please send prescription to:  --- , and include estimated need by date in rx notes. Pharmacy will ship medication directly to the office.  Patient NOT eligible for Prolia  Copay Card. Copay Card can make patient's cost as little as $25. Link to apply: [email or web address]://[email or web address]  ** This summary of benefits is an estimation of the patient's out-of-pocket cost. Exact cost may very based on individual plan coverage.

## 2024-03-20 NOTE — Telephone Encounter (Signed)
 Megan Bradshaw

## 2024-04-03 ENCOUNTER — Telehealth: Payer: Self-pay | Admitting: Family Medicine

## 2024-04-03 NOTE — Addendum Note (Signed)
 Addended by: Wyn Heater on: 04/03/2024 02:56 PM   Modules accepted: Orders

## 2024-04-03 NOTE — Addendum Note (Signed)
 Addended by: Wyn Heater on: 04/03/2024 02:58 PM   Modules accepted: Orders

## 2024-04-03 NOTE — Telephone Encounter (Signed)
 Spoke with pt. Please see prolia  referral.

## 2024-04-03 NOTE — Telephone Encounter (Signed)
 Copied from CRM [id number]. Topic: General - Call Back - No Documentation >> Apr 03, 2024  9:10 AM Caliyah H wrote: Reason for CRM: Patient returned a missed call from the office. Patient stated that no name or voicemail was left.

## 2024-04-04 ENCOUNTER — Other Ambulatory Visit (INDEPENDENT_AMBULATORY_CARE_PROVIDER_SITE_OTHER)

## 2024-04-04 ENCOUNTER — Ambulatory Visit: Payer: Self-pay | Admitting: Family Medicine

## 2024-04-04 DIAGNOSIS — M810 Age-related osteoporosis without current pathological fracture: Secondary | ICD-10-CM

## 2024-04-04 LAB — BASIC METABOLIC PANEL WITH GFR
BUN: 20 mg/dL (ref 6–23)
CO2: 29 meq/L (ref 19–32)
Calcium: 9 mg/dL (ref 8.4–10.5)
Chloride: 100 meq/L (ref 96–112)
Creatinine, Ser: 0.81 mg/dL (ref 0.40–1.20)
GFR: 63.31 mL/min (ref >60.00)
Glucose, Bld: 175 mg/dL — ABNORMAL HIGH (ref 70–99)
Potassium: 4.2 meq/L (ref 3.5–5.1)
Sodium: 137 meq/L (ref 135–145)

## 2024-04-04 LAB — VITAMIN D 25 HYDROXY (VIT D DEFICIENCY, FRACTURES): VITD: 61.64 ng/mL (ref 30.00–100.00)

## 2024-04-10 ENCOUNTER — Ambulatory Visit (INDEPENDENT_AMBULATORY_CARE_PROVIDER_SITE_OTHER)

## 2024-04-10 DIAGNOSIS — M810 Age-related osteoporosis without current pathological fracture: Secondary | ICD-10-CM

## 2024-04-10 MED ORDER — DENOSUMAB 60 MG/ML ~~LOC~~ SOSY
60.0000 mg | PREFILLED_SYRINGE | SUBCUTANEOUS | Status: AC
  Administered 2024-10-15: 60 mg via SUBCUTANEOUS

## 2024-04-10 NOTE — Progress Notes (Signed)
 Per orders of Dr. Cherlyn Cornet, initial injection of Prolia  given by Franne Ivory, CMA in Right Arm.  Provide her an information sheet from the [employer] of Rheumatology. Held her for 10 mins for observation.   Patient tolerated injection well.

## 2024-04-26 DIAGNOSIS — E113211 Type 2 diabetes mellitus with mild nonproliferative diabetic retinopathy with macular edema, right eye: Secondary | ICD-10-CM | POA: Diagnosis not present

## 2024-04-26 DIAGNOSIS — H353131 Nonexudative age-related macular degeneration, bilateral, early dry stage: Secondary | ICD-10-CM | POA: Diagnosis not present

## 2024-04-26 DIAGNOSIS — E113213 Type 2 diabetes mellitus with mild nonproliferative diabetic retinopathy with macular edema, bilateral: Secondary | ICD-10-CM | POA: Diagnosis not present

## 2024-04-26 DIAGNOSIS — E113293 Type 2 diabetes mellitus with mild nonproliferative diabetic retinopathy without macular edema, bilateral: Secondary | ICD-10-CM | POA: Diagnosis not present

## 2024-05-01 ENCOUNTER — Ambulatory Visit (INDEPENDENT_AMBULATORY_CARE_PROVIDER_SITE_OTHER): Admitting: Pulmonary Disease

## 2024-05-01 ENCOUNTER — Encounter: Payer: Self-pay | Admitting: Pulmonary Disease

## 2024-05-01 VITALS — BP 150/84 | HR 76 | Temp 97.8°F | Ht 63.5 in | Wt 174.0 lb

## 2024-05-01 DIAGNOSIS — G4736 Sleep related hypoventilation in conditions classified elsewhere: Secondary | ICD-10-CM

## 2024-05-01 DIAGNOSIS — J4489 Sleep related hypoventilation in conditions classified elsewhere: Secondary | ICD-10-CM

## 2024-05-01 DIAGNOSIS — J441 Chronic obstructive pulmonary disease with (acute) exacerbation: Secondary | ICD-10-CM

## 2024-05-01 DIAGNOSIS — J449 Chronic obstructive pulmonary disease, unspecified: Secondary | ICD-10-CM

## 2024-05-01 DIAGNOSIS — Z87891 Personal history of nicotine dependence: Secondary | ICD-10-CM

## 2024-05-01 MED ORDER — PREDNISONE 10 MG (21) PO TBPK: ORAL_TABLET | ORAL | 0 refills | Status: DC

## 2024-05-01 NOTE — Progress Notes (Signed)
 Subjective:    Patient ID: Megan Bradshaw, female    DOB: 03-05-1930, 89 y.o.   MRN: [id number]  Patient Care Team: Judithann Novas, MD as PCP - General (Family Medicine) [facility] (Inactive) as Pharmacist (Pharmacist) Marc Senior, MD as Consulting Physician (Pulmonary Disease) Clair Crews, MD as Referring Physician (Ophthalmology)  Chief Complaint  Patient presents with   Follow-up    Shortness of breath on exertion.     BACKGROUND/INTERVAL:Megan Bradshaw is an 89 year old former smoker (45 PY) who presents for follow-up on the issue of dyspnea and GOLD class II COPD.  Dyspnea out of proportion to her COPD.  This is a scheduled visit, she was last seen on 29 November 2023 by me.  She has been compliant with overnight oxygen  and notices feeling refreshed in the morning and less fatigue during the day, this in turn has helped her dyspnea.  She presents today for scheduled ollow-up visit.    HPI Discussed the use of AI scribe software for clinical note transcription with the patient, who gave verbal consent to proceed.  History of Present Illness   Megan Bradshaw is a 89 year old female with asthmatic bronchitis who presents with congestion and difficulty breathing.  She feels 'lousy' and 'completely plugged' with congestion and difficulty breathing, attributing her symptoms to allergens in the air. She experiences lightheadedness and difficulty while going to the pool, noting that she usually feels fine during visits but is currently unwell.  She has not had any fevers, chills or sweats.  No purulent sputum production and no hemoptysis.  No chest pain, orthopnea or paroxysmal nocturnal dyspnea.  No lower extremity edema.  She has been taking her cetirizine  for allergies without relief. She believes prednisone  is the only medication that helps break up her congestion. Her current medications include Breo, which she recently refilled, and albuterol  as needed. No refill is  required for her inhaler at this time.  She has been doing well with the Breo and the albuterol  in general.  No sputum production when coughing.      DATA: 10/22/14 PFTs: mild obstruction, FEV1 1.36 L (81%), FEV1/FVC 53%, TLC normal, DLCO 51% pred 02/16/16 PFTs: FVC: 2.23 L (90 %pred), FEV1: 1.30 L (71 %pred), FEV1/FVC: 58% , TLC: invalid, DLCO 78% pred, consistent with mild/moderate obstructive defect 10/14/2019 2D echo: LVEF 50 to 55%, normal diastolics, normal pulmonary artery systolic pressure 03/31/2020 overnight oximetry on RA: No oxygen  desaturations of clinical significance. Lowest O2 sat 88% of less than 2 minutes duration.  Average O2 sat 93% 04/28/2022 overnight oximetry on RA: Patient qualified for nocturnal oxygen .  Basal SPO2 was 91% nadir to 87% total of 216 oxygen  desaturation events, ODI of 21 06/02/2022 PFTs: FEV1 1.12 L or 78% predicted, FVC is 2.15 L or 110% predicted.  FEV1/FVC 52%, no bronchodilator response. Lung volumes were normal.  Diffusion capacity moderately reduced.  Consistent with moderately severe obstructive airways disease, restriction probable likely due to obesity.  Moderately severe diffusion defect/abnormality. Since the prior study of 02/16/19/2017 there was a decrease in FEV1 by 180 mL. 06/27/2022 echocardiogram: LVEF 55%, normal wall motion abnormality, grade 1 diastolic dysfunction, RV function normal.  No valvular abnormalities.  No evidence of pulmonary hypertension.    Review of Systems A 10 point review of systems was performed and it is as noted above otherwise negative.   Patient Active Problem List   Diagnosis Date Noted   Age-related osteoporosis without current pathological fracture 03/13/2024  Hyperlipidemia associated with type 2 diabetes mellitus (HCC) 01/31/2024   Pre-ulcerative calluses 01/19/2023   Vertigo 12/13/2022   Left lower quadrant abdominal pain 03/03/2022   Neuropathy due to type 2 diabetes mellitus (HCC) 03/26/2021    Arthritis 12/07/2017   OAB (overactive bladder) 09/01/2016   Seasonal allergies 09/01/2016   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) 09/22/2014   Hypothyroid 09/22/2014   COPD mixed type (HCC) 08/12/2014    Social History   Tobacco Use   Smoking status: Former    Current packs/day: 0.00    Average packs/day: 1.5 packs/day for 30.0 years (45.0 ttl pk-yrs)    Types: Cigarettes    Start date: 11/14/1957    Quit date: 11/15/1987    Years since quitting: 36.4   Smokeless tobacco: Never  Substance Use Topics   Alcohol use: No    Alcohol/week: 0.0 standard drinks of alcohol    Allergies  Allergen Reactions   Myrbetriq  [Mirabegron  Er] Other (See Comments)    GI Problem   Augmentin  [Amoxicillin -Pot Clavulanate] Diarrhea    Current Meds  Medication Sig   ACCU-CHEK GUIDE TEST test strip USE AS DIRECTED TO CHECK BLOOD SUGAR TWICE DAILY   Accu-Chek Softclix Lancets lancets Use to check blood sugar 1 times a day   albuterol  (PROVENTIL ) (2.5 MG/3ML) 0.083% nebulizer solution Take 3 mLs (2.5 mg total) by nebulization every 4 (four) hours as needed for wheezing or shortness of breath.   albuterol  (VENTOLIN  HFA) 108 (90 Base) MCG/ACT inhaler INHALE 1 TO 2 PUFFS BY MOUTH EVERY 6 HOURS FOR PAIN OR WHEEZING OR SHORTNESS OF BREATH   B Complex-Folic Acid (BENFOTIAMINE MULTI-B) CAPS Take 1 capsule by mouth daily.   Blood Glucose Monitoring Suppl (ACCU-CHEK GUIDE ME) w/Device KIT See admin instructions.   BREO ELLIPTA  100-25 MCG/ACT AEPB Inhale 1 puff into the lungs daily.   cetirizine  (ZYRTEC  ALLERGY) 10 MG tablet Take 1 tablet (10 mg total) by mouth daily.   Cholecalciferol (VITAMIN D  PO) Take 1 tablet by mouth daily.   clobetasol  cream (TEMOVATE ) 0.05 % Apply 1 Application topically daily as needed.   fluticasone  (FLONASE ) 50 MCG/ACT nasal spray Place 2 sprays into both nostrils daily.   gabapentin  (NEURONTIN ) 100 MG capsule TAKE 1 CAPSULE(100 MG) BY MOUTH THREE  TIMES DAILY   glipiZIDE  (GLUCOTROL  XL) 10 MG 24 hr tablet TAKE 1 TABLET(10 MG) BY MOUTH DAILY WITH BREAKFAST   ipratropium-albuterol  (DUONEB) 0.5-2.5 (3) MG/3ML SOLN USE 3 ML VIA NEBULIZER EVERY 6 HOURS AS NEEDED   ketoconazole  (NIZORAL ) 2 % cream Apply 1 Application topically daily.   levothyroxine  (SYNTHROID ) 75 MCG tablet TAKE 1 TABLET(75 MCG) BY MOUTH DAILY BEFORE BREAKFAST   metFORMIN  (GLUCOPHAGE ) 500 MG tablet TAKE 1 TABLET(500 MG) BY MOUTH TWICE DAILY WITH A MEAL   Multiple Vitamin (MULTIVITAMIN) capsule Take 1 capsule by mouth daily.   nystatin  cream (MYCOSTATIN ) Apply 1 Application topically 2 (two) times daily.   predniSONE  (DELTASONE ) 5 MG tablet TAKE 1 TABLET(5 MG) BY MOUTH DAILY WITH BREAKFAST   predniSONE  (STERAPRED UNI-PAK 21 TAB) 10 MG (21) TBPK tablet Take as directed on the package, this is a taper pack.   Current Facility-Administered Medications for the 05/01/24 encounter (Office Visit) with Marc Senior, MD  Medication   [START ON 10/11/2024] denosumab  (PROLIA ) injection 60 mg    Immunization History  Administered Date(s) Administered   Fluad Quad(high Dose 65+) 09/03/2020, 08/02/2021, 08/14/2022   Fluad Trivalent(High Dose 65+) 07/11/2023   Influenza,inj,Quad PF,6+ Mos 08/20/2015,  07/22/2016, 08/14/2017, 08/23/2018, 08/29/2019   Influenza-Unspecified 08/14/2014   PFIZER Comirnaty(Gray Top)Covid-19 Tri-Sucrose Vaccine 04/15/2021   PFIZER(Purple Top)SARS-COV-2 Vaccination 01/07/2020, 01/28/2020, 10/11/2020   Pneumococcal Conjugate-13 08/20/2015   Pneumococcal Polysaccharide-23 08/29/2019   Respiratory Syncytial Virus Vaccine,Recomb Aduvanted(Arexvy) 08/14/2022   Rsv, Bivalent, Protein Subunit Rsvpref,pf Pattricia Bores) 08/13/2022        Objective:     BP (!) 150/84 (BP Location: Left Arm, Patient Position: Sitting, Cuff Size: Normal)   Pulse 76   Temp 97.8 F (36.6 C) (Oral)   Ht 5' 3.5 (1.613 m)   Wt 174 lb (78.9 kg)   SpO2 97%   BMI 30.34 kg/m    SpO2: 97 %  GENERAL: Overweight elderly female, quite spry, very well-groomed.  Presents in transport chair today. No conversational dyspnea. No distress. HEAD: Normocephalic, atraumatic. EYES: Pupils equal, round, reactive to light.  No scleral icterus. MOUTH: Lower teeth in poor repair.  Oral mucosa moist. NECK: Supple. No thyromegaly. Trachea midline. No JVD.  No adenopathy. PULMONARY: She has increased AP diameter due to kyphosis.Good air entry bilaterally.  Few rhonchi, otherwise, no adventitious sounds. CARDIOVASCULAR: S1 and S2. Regular rate and rhythm. Soft grade 1/6 systolic ejection murmur left sternal border. No rubs or gallops noted. ABDOMEN: Obese,nondistended. MUSCULOSKELETAL: No lower extremity edema noted.   NEUROLOGIC: No overt focal deficits noted. Speech is fluent. SKIN: Intact,warm,dry. On limited exam no rashes. PSYCH: Mood and behavior normal.      Assessment & Plan:     ICD-10-CM   1. Stage 2 moderate COPD by GOLD classification (HCC)  J44.9     2. COPD with acute exacerbation (HCC)  J44.1     3. Nocturnal hypoxemia due to obstructive chronic bronchitis (HCC)  J44.89    G47.36      Meds ordered this encounter  Medications   predniSONE  (STERAPRED UNI-PAK 21 TAB) 10 MG (21) TBPK tablet    Sig: Take as directed on the package, this is a taper pack.    Dispense:  21 tablet    Refill:  0    Discussion:    COPD with acute exacerbation -mild Mild COPD exacerbation likely triggered by environmental allergens. She reports congestion and dyspnea, with no wheezing on examination. Congestion causes some rattling. Initial hypertension likely due to physical activity resolved upon re-evaluation. - Prescribe prednisone  taper pack for exacerbation. - Continue Breo and PRN albuterol  for maintenance and relief. - Follow up in three months or as needed.    Nocturnal hypoxemia due to COPD Patient compliant with nocturnal oxygen , notes benefit of therapy.  Continue  same.  Advised if symptoms do not improve or worsen, to please contact office for sooner follow up or seek emergency care.    I spent 32 minutes of dedicated to the care of this patient on the date of this encounter to include pre-visit review of records, face-to-face time with the patient discussing conditions above, post visit ordering of testing, clinical documentation with the electronic health record, making appropriate referrals as documented, and communicating necessary findings to members of the patients care team.     C. Chloe Counter, MD Advanced Bronchoscopy P[facility]  [facility]    *This note was generated using voice recognition software/Dragon and/or AI transcription program.  Despite best efforts to proofread, errors can occur which can change the meaning. Any transcriptional errors that result from this process are unintentional and may not be fully corrected at the time of dictation.

## 2024-05-01 NOTE — Patient Instructions (Signed)
 VISIT SUMMARY:  Today, you came in because you were feeling congested and having trouble breathing. You mentioned that you feel 'lousy' and 'completely plugged' and think that allergens in the air might be causing your symptoms. You also felt lightheaded and had difficulty going to the pool, which is unusual for you. You have been taking Certek for allergies without relief and believe that prednisone  is the only medication that helps with your congestion. Your current medications include Breo and albuterol , which you use as needed.  YOUR PLAN:  -COPD WITH ACUTE EXACERBATION: You have a mild flare-up of your chronic obstructive pulmonary disease (COPD), likely triggered by allergens in the environment. COPD is a lung condition that makes it hard to breathe. To help with this flare-up, I am prescribing a prednisone  taper pack. Continue using your Breo inhaler daily and your albuterol  inhaler as needed for relief.  -NOCTURNAL HYPOXEMIA DUE TO COPD: Continue using your oxygen  at nighttime.  INSTRUCTIONS:  Please follow up in three months or sooner if needed.

## 2024-05-10 ENCOUNTER — Other Ambulatory Visit: Payer: Self-pay | Admitting: Family Medicine

## 2024-05-10 DIAGNOSIS — E099 Drug or chemical induced diabetes mellitus without complications: Secondary | ICD-10-CM

## 2024-05-13 ENCOUNTER — Ambulatory Visit: Admitting: Pulmonary Disease

## 2024-05-24 ENCOUNTER — Other Ambulatory Visit

## 2024-06-03 ENCOUNTER — Other Ambulatory Visit: Payer: Self-pay | Admitting: Podiatry

## 2024-06-03 DIAGNOSIS — E0843 Diabetes mellitus due to underlying condition with diabetic autonomic (poly)neuropathy: Secondary | ICD-10-CM

## 2024-06-03 DIAGNOSIS — M2042 Other hammer toe(s) (acquired), left foot: Secondary | ICD-10-CM

## 2024-06-03 NOTE — Progress Notes (Signed)
 Referral for DM shoes and inserts sent to [facility] medical in [place] I called and LM for patient with info and [facility] phone number  Lolita Schultze

## 2024-06-11 ENCOUNTER — Encounter: Payer: Self-pay | Admitting: Podiatry

## 2024-06-11 ENCOUNTER — Ambulatory Visit (INDEPENDENT_AMBULATORY_CARE_PROVIDER_SITE_OTHER): Admitting: Podiatry

## 2024-06-11 VITALS — Ht 63.5 in | Wt 174.0 lb

## 2024-06-11 DIAGNOSIS — M2041 Other hammer toe(s) (acquired), right foot: Secondary | ICD-10-CM

## 2024-06-11 DIAGNOSIS — M2042 Other hammer toe(s) (acquired), left foot: Secondary | ICD-10-CM

## 2024-06-11 DIAGNOSIS — E0843 Diabetes mellitus due to underlying condition with diabetic autonomic (poly)neuropathy: Secondary | ICD-10-CM

## 2024-06-11 DIAGNOSIS — M19079 Primary osteoarthritis, unspecified ankle and foot: Secondary | ICD-10-CM | POA: Diagnosis not present

## 2024-06-11 NOTE — Progress Notes (Signed)
   Chief Complaint  Patient presents with   Diabetes    Pt is here to get feet evaluated in order to receive diabetic shoes thru [facility].    HPI: 89 y.o. female PMHx T2DM with peripheral polyneuropathy, DJD bilateral feet, bunion deformity, hammertoes bilateral presenting for routine diabetic foot evaluation.  Also requesting order for new diabetic shoes and diabetic Plastizote insoles.  Past Medical History:  Diagnosis Date   Actinic keratosis    Arthritis    Asthma    COPD (chronic obstructive pulmonary disease) (HCC)    Diabetes (HCC)    SCC (squamous cell carcinoma) 10/09/2023   left lower lateral leg, [facility] 11/03/2023   Thyroid  disease     Past Surgical History:  Procedure Laterality Date   ABDOMINAL HYSTERECTOMY  1984   benign, TAH.   APPENDECTOMY  1939    CATARACT EXTRACTION, BILATERAL     CHOLECYSTECTOMY  1987   NASAL SINUS SURGERY  1990   REPLACEMENT TOTAL KNEE Right 2007    Allergies  Allergen Reactions   Myrbetriq  [Mirabegron  Er] Other (See Comments)    GI Problem   Augmentin  [Amoxicillin -Pot Clavulanate] Diarrhea     Physical Exam: General: The patient is alert and oriented x3 in no acute distress.  Dermatology: Skin is warm, dry and supple bilateral lower extremities.   Vascular: Palpable pedal pulses bilaterally. Capillary refill within normal limits.  No appreciable edema.  No erythema.  Varicosities noted bilateral lower extremities  Neurological: Light touch and protective threshold diminished  Musculoskeletal Exam: Chronic arthritis noted throughout the pedal joints of the foot bilateral with medial longitudinal arch collapse.  Hallux valgus deformity also noted bilateral with overlapping deformity of the hammertoe of the second digit right foot.  Hammertoes also noted to the lesser digits bilateral  Assessment/Plan of Care: 1.  Diabetes mellitus with peripheral polyneuropathy 2.  DJD/arthritis bilateral feet 3.  Hallux valgus deformity with  hammertoes of the lesser digits bilateral  -Patient evaluated.  Comprehensive diabetic foot exam performed today -Order placed for diabetic shoes with custom molded Plastizote insoles x 3 sent to [facility] medical supply. -Continue management with PCP for diabetes control -Return to clinic annually       Thresa EMERSON Sar, DPM [facility]  Dr. Thresa EMERSON Sar, DPM    [address] [place], [place] [address]                Office [phone]  Fax [phone]

## 2024-06-24 ENCOUNTER — Ambulatory Visit: Payer: Self-pay

## 2024-06-24 NOTE — Telephone Encounter (Signed)
 FYI Only or Action Required?: FYI only for provider.  Patient was last seen in primary care on 03/13/2024 by Megan Greig BRAVO, MD.  Called Nurse Triage reporting Breathing Problem.   Triage Disposition: Call PCP Now  Patient/caregiver understands and will follow disposition?: Yes   Copied from CRM #[id number]. Topic: Clinical - Red Word Triage >> Jun 24, 2024  8:33 AM Megan Bradshaw wrote: Patient is having trouble breathing Reason for Disposition  [1] Caller demands to speak with the PCP AND [2] about sick adult (or sick caller)  Additional Information  Commented on: Answer Assessment    Triage was difficult/limited Bradshaw/t pt wanting appt with PCP today-- pt stated that PCP knows her well and will get her same day appt.  CAL Rep Megan (?) notified of pt request, patient transferred for further assistance.  Answer Assessment - Initial Assessment Questions 1. RESPIRATORY STATUS: Describe your breathing? (e.g., wheezing, shortness of breath, unable to speak, severe coughing)      Endorses wearing 2L at night I've been having trouble breathing... my body is aching 2. ONSET: When did this breathing problem begin?      A few days 3. PATTERN Does the difficult breathing come and go, or has it been constant since it started?      constant 4. SEVERITY: How bad is your breathing? (e.g., mild, moderate, severe)      Mild [facility] does not appreciate audible SOB/wheezing during call. Pt is speaking in full sentences.  5. RECURRENT SYMPTOM: Have you had difficulty breathing before? If Yes, ask: When was the last time? and What happened that time?      denies 6. CARDIAC HISTORY: Do you have any history of heart disease? (e.g., heart attack, angina, bypass surgery, angioplasty)      *No Answer* 7. LUNG HISTORY: Do you have any history of lung disease?  (e.g., pulmonary embolus, asthma, emphysema)     *No Answer* 8. CAUSE: What do you think is causing the breathing problem?      *No  Answer* 9. OTHER SYMPTOMS: Do you have any other symptoms? (e.g., chest pain, cough, dizziness, fever, runny nose)     *No Answer* 10. O2 SATURATION MONITOR:  Do you use an oxygen  saturation monitor (pulse oximeter) at home? If Yes, ask: What is your reading (oxygen  level) today? What is your usual oxygen  saturation reading? (e.g., 95%)       *No Answer* 11. PREGNANCY: Is there any chance you are pregnant? When was your last menstrual period?       *No Answer* 12. TRAVEL: Have you traveled out of the country in the last month? (e.g., travel history, exposures)       *No Answer*  Protocols used: Breathing Difficulty-A-AH, Difficult Call-A-AH

## 2024-06-25 ENCOUNTER — Encounter: Payer: Self-pay | Admitting: Family Medicine

## 2024-06-25 ENCOUNTER — Ambulatory Visit: Payer: Self-pay | Admitting: Family Medicine

## 2024-06-25 ENCOUNTER — Ambulatory Visit (INDEPENDENT_AMBULATORY_CARE_PROVIDER_SITE_OTHER): Admitting: Family Medicine

## 2024-06-25 VITALS — BP 140/80 | HR 91 | Temp 98.1°F | Ht 63.8 in | Wt 173.4 lb

## 2024-06-25 DIAGNOSIS — R0609 Other forms of dyspnea: Secondary | ICD-10-CM | POA: Diagnosis not present

## 2024-06-25 DIAGNOSIS — M79671 Pain in right foot: Secondary | ICD-10-CM | POA: Insufficient documentation

## 2024-06-25 DIAGNOSIS — M79672 Pain in left foot: Secondary | ICD-10-CM | POA: Diagnosis not present

## 2024-06-25 LAB — CBC WITH DIFFERENTIAL/PLATELET
Basophils Absolute: 0.1 K/uL (ref 0.0–0.1)
Basophils Relative: 0.9 % (ref 0.0–3.0)
Eosinophils Absolute: 0.3 K/uL (ref 0.0–0.7)
Eosinophils Relative: 3.8 % (ref 0.0–5.0)
HCT: 41.5 % (ref 36.0–46.0)
Hemoglobin: 13.7 g/dL (ref 12.0–15.0)
Lymphocytes Relative: 22.1 % (ref 12.0–46.0)
Lymphs Abs: 2 K/uL (ref 0.7–4.0)
MCHC: 33 g/dL (ref 30.0–36.0)
MCV: 91.5 fl (ref 78.0–100.0)
Monocytes Absolute: 1 K/uL (ref 0.1–1.0)
Monocytes Relative: 10.5 % (ref 3.0–12.0)
Neutro Abs: 5.8 K/uL (ref 1.4–7.7)
Neutrophils Relative %: 62.7 % (ref 43.0–77.0)
Platelets: 341 K/uL (ref 150.0–400.0)
RBC: 4.53 Mil/uL (ref 3.87–5.11)
RDW: 14.4 % (ref 11.5–15.5)
WBC: 9.2 K/uL (ref 4.0–10.5)

## 2024-06-25 LAB — COMPREHENSIVE METABOLIC PANEL WITH GFR
ALT: 12 U/L (ref 0–35)
AST: 16 U/L (ref 0–37)
Albumin: 4.3 g/dL (ref 3.5–5.2)
Alkaline Phosphatase: 51 U/L (ref 39–117)
BUN: 19 mg/dL (ref 6–23)
CO2: 31 meq/L (ref 19–32)
Calcium: 9.2 mg/dL (ref 8.4–10.5)
Chloride: 95 meq/L — ABNORMAL LOW (ref 96–112)
Creatinine, Ser: 0.73 mg/dL (ref 0.40–1.20)
GFR: 71.61 mL/min (ref >60.00)
Glucose, Bld: 118 mg/dL — ABNORMAL HIGH (ref 70–99)
Potassium: 4.7 meq/L (ref 3.5–5.1)
Sodium: 133 meq/L — ABNORMAL LOW (ref 135–145)
Total Bilirubin: 0.4 mg/dL (ref 0.2–1.2)
Total Protein: 6.7 g/dL (ref 6.0–8.3)

## 2024-06-25 LAB — TSH: TSH: 2.31 u[IU]/mL (ref 0.35–5.50)

## 2024-06-25 LAB — BRAIN NATRIURETIC PEPTIDE: Pro B Natriuretic peptide (BNP): 27 pg/mL (ref 0.0–100.0)

## 2024-06-25 MED ORDER — PREDNISONE 10 MG PO TABS
ORAL_TABLET | ORAL | 0 refills | Status: DC
Start: 2024-06-25 — End: 2024-08-14

## 2024-06-25 NOTE — Progress Notes (Signed)
 Patient ID: Megan Bradshaw, female    DOB: Apr 01, 1930, 89 y.o.   MRN: [id number]  This visit was conducted in person.  BP (!) 140/80   Pulse 91   Temp 98.1 F (36.7 C) (Temporal)   Ht 5' 3.8 (1.621 m)   Wt 173 lb 6 oz (78.6 kg)   SpO2 96%   BMI 29.95 kg/m    CC:  Chief Complaint  Patient presents with   Foot Pain    Bilateral    Leg Pain    Bilateral    Shortness of Breath        Wheezing    Subjective:   HPI: Megan Bradshaw is a 89 y.o. female presenting on 06/25/2024 for Foot Pain (Bilateral/), Leg Pain (Bilateral ), Shortness of Breath (/), and Wheezing  She presents with new onset bilateral leg and foot pain.  Worsened in last week. Pain across  dorsal feet. And in soles of feet.  No fall, no injury.  She does pool aerobics... but walked more in pool  the day prior to leg / foot pain. She feels that she has been feeling weak and bad ever since bing in the cold pool tahat day. She is currently on gabapentin  300 mg at night... does not tolerate durin the day.  Wearing compression hose... swelling has improved.  Had one day last week with swelling.  Sees podiatry.  She also reports worsening of shortness of breath and increasing wheezing in last week.   New cough,  dry, mild.  No fever. She is using Breo at the 1 puff daily DuoNebs/albuterol  as needed... has not had to use.   History of COPD, neuropathy secondary to diabetes   SE to tylenol ... ibuprofen did not help 800 mg.  Relevant past medical, surgical, family and social history reviewed and updated as indicated. Interim medical history since our last visit reviewed. Allergies and medications reviewed and updated. Outpatient Medications Prior to Visit  Medication Sig Dispense Refill   ACCU-CHEK GUIDE TEST test strip USE AS DIRECTED TO CHECK BLOOD SUGAR TWICE DAILY 200 strip 1   Accu-Chek Softclix Lancets lancets USE TO CHECK BLOOD SUGAR ONCE A DAY 100 each 3   albuterol  (PROVENTIL ) (2.5 MG/3ML)  0.083% nebulizer solution Take 3 mLs (2.5 mg total) by nebulization every 4 (four) hours as needed for wheezing or shortness of breath. 150 mL 5   albuterol  (VENTOLIN  HFA) 108 (90 Base) MCG/ACT inhaler INHALE 1 TO 2 PUFFS BY MOUTH EVERY 6 HOURS FOR PAIN OR WHEEZING OR SHORTNESS OF BREATH 18 g 2   B Complex-Folic Acid (BENFOTIAMINE MULTI-B) CAPS Take 1 capsule by mouth daily. 30 capsule 6   Blood Glucose Monitoring Suppl (ACCU-CHEK GUIDE ME) w/Device KIT See admin instructions.     BREO ELLIPTA  100-25 MCG/ACT AEPB Inhale 1 puff into the lungs daily. 180 each 3   cetirizine  (ZYRTEC  ALLERGY) 10 MG tablet Take 1 tablet (10 mg total) by mouth daily. 30 tablet 11   Cholecalciferol (VITAMIN D  PO) Take 1 tablet by mouth daily.     clobetasol  cream (TEMOVATE ) 0.05 % Apply 1 Application topically daily as needed. 30 g 1   fluticasone  (FLONASE ) 50 MCG/ACT nasal spray Place 2 sprays into both nostrils daily. 16 g 2   gabapentin  (NEURONTIN ) 100 MG capsule TAKE 1 CAPSULE(100 MG) BY MOUTH THREE TIMES DAILY 180 capsule 3   glipiZIDE  (GLUCOTROL  XL) 10 MG 24 hr tablet TAKE 1 TABLET(10 MG) BY MOUTH DAILY WITH BREAKFAST 90 tablet  1   ipratropium-albuterol  (DUONEB) 0.5-2.5 (3) MG/3ML SOLN USE 3 ML VIA NEBULIZER EVERY 6 HOURS AS NEEDED 360 mL 1   ketoconazole  (NIZORAL ) 2 % cream Apply 1 Application topically daily. 15 g 0   levothyroxine  (SYNTHROID ) 75 MCG tablet TAKE 1 TABLET(75 MCG) BY MOUTH DAILY BEFORE BREAKFAST 90 tablet 3   metFORMIN  (GLUCOPHAGE ) 500 MG tablet TAKE 1 TABLET(500 MG) BY MOUTH TWICE DAILY WITH A MEAL 180 tablet 3   Multiple Vitamin (MULTIVITAMIN) capsule Take 1 capsule by mouth daily.     nystatin  cream (MYCOSTATIN ) Apply 1 Application topically 2 (two) times daily. 30 g 5   predniSONE  (DELTASONE ) 5 MG tablet TAKE 1 TABLET(5 MG) BY MOUTH DAILY WITH BREAKFAST 90 tablet 1   predniSONE  (STERAPRED UNI-PAK 21 TAB) 10 MG (21) TBPK tablet Take as directed on the package, this is a taper pack. 21 tablet 0    Facility-Administered Medications Prior to Visit  Medication Dose Route Frequency Provider Last Rate Last Admin   [START ON 10/11/2024] denosumab  (PROLIA ) injection 60 mg  60 mg Subcutaneous Q6 months Lota Leamer E, MD         Per HPI unless specifically indicated in ROS section below Review of Systems  Constitutional:  Negative for fatigue and fever.  HENT:  Negative for congestion.   Eyes:  Negative for pain.  Respiratory:  Negative for cough and shortness of breath.   Cardiovascular:  Negative for chest pain, palpitations and leg swelling.  Gastrointestinal:  Negative for abdominal pain.  Genitourinary:  Negative for dysuria and vaginal bleeding.  Musculoskeletal:  Negative for back pain.  Neurological:  Negative for syncope, light-headedness and headaches.  Psychiatric/Behavioral:  Negative for dysphoric mood.    Objective:  BP (!) 140/80   Pulse 91   Temp 98.1 F (36.7 C) (Temporal)   Ht 5' 3.8 (1.621 m)   Wt 173 lb 6 oz (78.6 kg)   SpO2 96%   BMI 29.95 kg/m   Wt Readings from Last 3 Encounters:  06/25/24 173 lb 6 oz (78.6 kg)  06/11/24 174 lb (78.9 kg)  05/01/24 174 lb (78.9 kg)      Physical Exam Constitutional:      General: She is not in acute distress.    Appearance: Normal appearance. She is well-developed. She is not ill-appearing or toxic-appearing.  HENT:     Head: Normocephalic.     Right Ear: Hearing, tympanic membrane, ear canal and external ear normal. Tympanic membrane is not erythematous, retracted or bulging.     Left Ear: Hearing, tympanic membrane, ear canal and external ear normal. Tympanic membrane is not erythematous, retracted or bulging.     Nose: No mucosal edema or rhinorrhea.     Right Sinus: No maxillary sinus tenderness or frontal sinus tenderness.     Left Sinus: No maxillary sinus tenderness or frontal sinus tenderness.     Mouth/Throat:     Pharynx: Uvula midline.  Eyes:     General: Lids are normal. Lids are everted, no  foreign bodies appreciated.     Conjunctiva/sclera: Conjunctivae normal.     Pupils: Pupils are equal, round, and reactive to light.  Neck:     Thyroid : No thyroid  mass or thyromegaly.     Vascular: No carotid bruit.     Trachea: Trachea normal.  Cardiovascular:     Rate and Rhythm: Regular rhythm. Tachycardia present.     Pulses: Normal pulses.     Heart sounds: Normal heart sounds, S1 normal  and S2 normal. No murmur heard.    No friction rub. No gallop.  Pulmonary:     Effort: Pulmonary effort is normal. No tachypnea or respiratory distress.     Breath sounds: Normal breath sounds. No decreased breath sounds, wheezing, rhonchi or rales.  Abdominal:     General: Bowel sounds are normal.     Palpations: Abdomen is soft.     Tenderness: There is no abdominal tenderness.  Musculoskeletal:     Cervical back: Normal range of motion and neck supple.     Comments: No current peripheral swelling Varicose veins Very tender to palpation diffusely over bilateral dorsal feet, pulls away to my palpation for pretty much entire bilateral lower leg exam.  She states she has achiness in her legs chronically.  Skin:    General: Skin is warm and dry.     Findings: No rash.  Neurological:     Mental Status: She is alert.  Psychiatric:        Mood and Affect: Mood is not anxious or depressed.        Speech: Speech normal.        Behavior: Behavior normal. Behavior is cooperative.        Thought Content: Thought content normal.        Judgment: Judgment normal.       Results for orders placed or performed in visit on 04/04/24  VITAMIN D  25 Hydroxy (Vit-D Deficiency, Fractures)   Collection Time: 04/04/24  8:15 AM  Result Value Ref Range   VITD 61.64 30.00 - 100.00 ng/mL  Basic metabolic panel with GFR   Collection Time: 04/04/24  8:15 AM  Result Value Ref Range   Sodium 137 135 - 145 mEq/L   Potassium 4.2 3.5 - 5.1 mEq/L   Chloride 100 96 - 112 mEq/L   CO2 29 19 - 32 mEq/L   Glucose, Bld  175 (H) 70 - 99 mg/dL   BUN 20 6 - 23 mg/dL   Creatinine, Ser 9.18 0.40 - 1.20 mg/dL   GFR 36.68 >39.99 mL/min   Calcium 9.0 8.4 - 10.5 mg/dL    Assessment and Plan  Dyspnea on exertion Assessment & Plan: Acute worsening of chronic issue.  Will evaluate for labs given respiratory exam is relatively benign in this patient with COPD.  Will also treat for possible mild flare of COPD from possible viral upper respiratory tract infection. Hold chronic prednisone  and increased taper to 30/20/10 over 7 days  Orders: -     CBC with Differential/Platelet -     Brain natriuretic peptide -     Comprehensive metabolic panel with GFR -     TSH  Bilateral foot pain Assessment & Plan: Acute on chronic peripheral neuropathy Pain seems to be more musculoskeletal less neuropathy related. Recommend prednisone  taper and elevation.  Given range of motion exercises to begin.  Return and ER precautions provided.   Other orders -     predniSONE ; 3 tabs by mouth daily x 3 days, then 2 tabs by mouth daily x 2 days then 1 tab by mouth daily x 2 days  Dispense: 15 tablet; Refill: 0    No follow-ups on file.   Greig Ring, MD

## 2024-06-25 NOTE — Assessment & Plan Note (Signed)
 Acute worsening of chronic issue.  Will evaluate for labs given respiratory exam is relatively benign in this patient with COPD.  Will also treat for possible mild flare of COPD from possible viral upper respiratory tract infection. Hold chronic prednisone  and increased taper to 30/20/10 over 7 days

## 2024-06-25 NOTE — Assessment & Plan Note (Signed)
 Acute on chronic peripheral neuropathy Pain seems to be more musculoskeletal less neuropathy related. Recommend prednisone  taper and elevation.  Given range of motion exercises to begin.  Return and ER precautions provided.

## 2024-06-25 NOTE — Telephone Encounter (Signed)
 Appointment scheduled with Dr. Avelina 08/12/205.

## 2024-06-29 ENCOUNTER — Other Ambulatory Visit: Payer: Self-pay | Admitting: Family Medicine

## 2024-07-23 ENCOUNTER — Ambulatory Visit (INDEPENDENT_AMBULATORY_CARE_PROVIDER_SITE_OTHER): Admitting: Podiatry

## 2024-07-23 ENCOUNTER — Encounter: Payer: Self-pay | Admitting: Podiatry

## 2024-07-23 ENCOUNTER — Ambulatory Visit

## 2024-07-23 VITALS — Ht 63.8 in | Wt 173.9 lb

## 2024-07-23 DIAGNOSIS — M2041 Other hammer toe(s) (acquired), right foot: Secondary | ICD-10-CM

## 2024-07-23 DIAGNOSIS — M2042 Other hammer toe(s) (acquired), left foot: Secondary | ICD-10-CM | POA: Diagnosis not present

## 2024-07-23 DIAGNOSIS — M65971 Unspecified synovitis and tenosynovitis, right ankle and foot: Secondary | ICD-10-CM | POA: Diagnosis not present

## 2024-07-23 DIAGNOSIS — M65972 Unspecified synovitis and tenosynovitis, left ankle and foot: Secondary | ICD-10-CM | POA: Diagnosis not present

## 2024-07-23 MED ORDER — MELOXICAM 15 MG PO TABS: 15.0000 mg | ORAL_TABLET | Freq: Every day | ORAL | 1 refills | Status: DC

## 2024-07-23 MED ORDER — BETAMETHASONE SOD PHOS & ACET 6 (3-3) MG/ML IJ SUSP
3.0000 mg | Freq: Once | INTRAMUSCULAR | Status: AC
  Administered 2024-07-23: 3 mg via INTRA_ARTICULAR

## 2024-07-23 NOTE — Progress Notes (Signed)
 Chief Complaint  Patient presents with   Foot Pain    Pt is here for bilateral foot pain states the pain has been going on for a while, starts at both arches and up to the toes, takes gabapentin  with no relief, when asked what type of pain she is feeling, she states it hurts. Both feet are hard to walk on due to pain.    HPI: 89 y.o. female PMHx T2DM with peripheral polyneuropathy, DJD bilateral feet, bunion deformity, hammertoes bilateral presenting for significant pain and tenderness associated to the bilateral forefoot.  She wears her diabetic shoes with custom Plastizote insoles and she takes gabapentin  with no relief.  She says that the pain in the forefoot is  affecting her ability to walk  Past Medical History:  Diagnosis Date   Actinic keratosis    Arthritis    Asthma    COPD (chronic obstructive pulmonary disease) (HCC)    Diabetes (HCC)    SCC (squamous cell carcinoma) 10/09/2023   left lower lateral leg, [facility] 11/03/2023   Thyroid  disease     Past Surgical History:  Procedure Laterality Date   ABDOMINAL HYSTERECTOMY  1984   benign, [facility].   APPENDECTOMY  1939    CATARACT EXTRACTION, BILATERAL     CHOLECYSTECTOMY  1987   NASAL SINUS SURGERY  1990   REPLACEMENT TOTAL KNEE Right 2007    Allergies  Allergen Reactions   Myrbetriq  [Mirabegron  Er] Other (See Comments)    GI Problem   Augmentin  [Amoxicillin -Pot Clavulanate] Diarrhea     Physical Exam: General: The patient is alert and oriented x3 in no acute distress.  Dermatology: Skin is warm, dry and supple bilateral lower extremities.   Vascular: Palpable pedal pulses bilaterally. Capillary refill within normal limits.  No appreciable edema.  No erythema.  Varicosities noted bilateral lower extremities  Neurological: Light touch and protective threshold diminished  Musculoskeletal Exam: Chronic arthritis noted throughout the pedal joints of the foot bilateral with medial longitudinal arch collapse.  Hallux  valgus deformity also noted bilateral with overlapping deformity of the hammertoe of the second digit right foot.  Hammertoes also noted to the lesser digits bilateral.  Significant pain tenderness associated to the lesser MTPs of the bilateral feet.  Pain with palpation and range of motion  Assessment/Plan of Care: 1.  Diabetes mellitus with peripheral polyneuropathy 2.  DJD/arthritis bilateral feet 3.  Metatarsalgia; metatarsophalangeal capsulitis/synovitis bilateral  4.  Hallux valgus deformity with hammertoes of the lesser digits bilateral  -Patient evaluated.   -Injection of 0.5 cc Celestone  Soluspan injected into the second MTP bilateral - Continue diabetic shoes with custom molded Plastizote insoles -Continue management with PCP for diabetes control -Unfortunately the patient has significant pain and tenderness associated to the bilateral midfoot.  She has tried gabapentin  with no improvement or relief.  She currently does not take any NSAIDs.  Not on anticoagulant. -Prescription for meloxicam  15 mg daily PRN to see if this helps alleviate some of the patient's pain and symptoms to the forefoot -We also discussed surgery today to correct for the hammertoe and bunion deformity however the patient does not want to pursue any surgical intervention at this time.  I agree and recommend continued conservative care -Return to clinic PRN       Thresa EMERSON Sar, DPM [facility]  Dr. Thresa EMERSON Sar, DPM    [address]  [place], [place] [address]                Office [phone]  Fax [phone]

## 2024-07-26 DIAGNOSIS — H353131 Nonexudative age-related macular degeneration, bilateral, early dry stage: Secondary | ICD-10-CM | POA: Diagnosis not present

## 2024-07-26 DIAGNOSIS — E113293 Type 2 diabetes mellitus with mild nonproliferative diabetic retinopathy without macular edema, bilateral: Secondary | ICD-10-CM | POA: Diagnosis not present

## 2024-07-26 DIAGNOSIS — E113211 Type 2 diabetes mellitus with mild nonproliferative diabetic retinopathy with macular edema, right eye: Secondary | ICD-10-CM | POA: Diagnosis not present

## 2024-07-26 DIAGNOSIS — M3501 Sicca syndrome with keratoconjunctivitis: Secondary | ICD-10-CM | POA: Diagnosis not present

## 2024-07-30 ENCOUNTER — Encounter: Payer: Self-pay | Admitting: Pulmonary Disease

## 2024-08-12 ENCOUNTER — Ambulatory Visit: Admitting: Dermatology

## 2024-08-12 DIAGNOSIS — L821 Other seborrheic keratosis: Secondary | ICD-10-CM | POA: Diagnosis not present

## 2024-08-12 DIAGNOSIS — I781 Nevus, non-neoplastic: Secondary | ICD-10-CM | POA: Diagnosis not present

## 2024-08-12 DIAGNOSIS — L905 Scar conditions and fibrosis of skin: Secondary | ICD-10-CM

## 2024-08-12 DIAGNOSIS — Z872 Personal history of diseases of the skin and subcutaneous tissue: Secondary | ICD-10-CM | POA: Diagnosis not present

## 2024-08-12 DIAGNOSIS — W908XXA Exposure to other nonionizing radiation, initial encounter: Secondary | ICD-10-CM | POA: Diagnosis not present

## 2024-08-12 DIAGNOSIS — L853 Xerosis cutis: Secondary | ICD-10-CM

## 2024-08-12 DIAGNOSIS — L578 Other skin changes due to chronic exposure to nonionizing radiation: Secondary | ICD-10-CM

## 2024-08-12 DIAGNOSIS — D1801 Hemangioma of skin and subcutaneous tissue: Secondary | ICD-10-CM | POA: Diagnosis not present
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
## 2024-08-12 NOTE — Patient Instructions (Addendum)

## 2024-08-13 ENCOUNTER — Other Ambulatory Visit: Payer: Self-pay | Admitting: Family Medicine

## 2024-08-13 NOTE — Telephone Encounter (Signed)
 Last office visit 06/25/2024 for dyspnea and bilateral foot pain.  Last refilled 05/16/2023 for 30 g with 5 refills. Next appt: 08/14/2024 for sinus infection.

## 2024-08-14 ENCOUNTER — Encounter: Payer: Self-pay | Admitting: Family Medicine

## 2024-08-14 ENCOUNTER — Ambulatory Visit: Admitting: Family Medicine

## 2024-08-14 VITALS — BP 134/78 | HR 82 | Temp 97.7°F | Ht 63.8 in | Wt 177.0 lb

## 2024-08-14 DIAGNOSIS — J0100 Acute maxillary sinusitis, unspecified: Secondary | ICD-10-CM

## 2024-08-14 MED ORDER — PREDNISONE 5 MG PO TABS: 5.0000 mg | ORAL_TABLET | Freq: Every day | ORAL | Status: DC

## 2024-08-14 MED ORDER — PREDNISONE 10 MG PO TABS: ORAL_TABLET | ORAL | 0 refills | Status: DC

## 2024-08-14 NOTE — Progress Notes (Signed)
 Patient ID: Megan Bradshaw, female    DOB: [birth year], 89 y.o.   MRN: [id number]  This visit was conducted in person.  BP 134/78   Pulse 82   Temp 97.7 F (36.5 C) (Oral)   Ht 5' 3.8 (1.621 m)   Wt 177 lb (80.3 kg)   SpO2 98%   BMI 30.57 kg/m    CC:  Chief Complaint  Patient presents with   Nasal Congestion    Since last Friday    Subjective:   HPI: Megan Bradshaw is a 89 y.o. female presenting on 08/14/2024 for Nasal Congestion (Since last Friday)   Date of onset: 6 days of Initial symptoms included  nasal congestion and pressure Symptoms progressed to left ear pain No  fever.  Has been using oxygen  at night.. feels oxygen  more labored than usual.  Some increase in wheeze.  No cough   Sick contacts:  none COVID testing:   none     She has tried to treat with   albuterol  prn ( using 3-4 times daily in last week), breo eelipta,  Zyrtec , flonase  and duoneb   on chronic prednisone  5 mg daily   Has history of COPD on longterm low dose steroid. Non-smoker.       Relevant past medical, surgical, family and social history reviewed and updated as indicated. Interim medical history since our last visit reviewed. Allergies and medications reviewed and updated. Outpatient Medications Prior to Visit  Medication Sig Dispense Refill   Accu-Chek Softclix Lancets lancets USE TO CHECK BLOOD SUGAR ONCE A DAY 100 each 3   albuterol  (PROVENTIL ) (2.5 MG/3ML) 0.083% nebulizer solution Take 3 mLs (2.5 mg total) by nebulization every 4 (four) hours as needed for wheezing or shortness of breath. 150 mL 5   albuterol  (VENTOLIN  HFA) 108 (90 Base) MCG/ACT inhaler INHALE 1 TO 2 PUFFS BY MOUTH EVERY 6 HOURS FOR PAIN OR WHEEZING OR SHORTNESS OF BREATH 18 g 2   B Complex-Folic Acid (BENFOTIAMINE MULTI-B) CAPS Take 1 capsule by mouth daily. 30 capsule 6   Blood Glucose Monitoring Suppl (ACCU-CHEK GUIDE ME) w/Device KIT See admin instructions.     BREO ELLIPTA  100-25 MCG/ACT AEPB Inhale 1  puff into the lungs daily. 180 each 3   cetirizine  (ZYRTEC  ALLERGY) 10 MG tablet Take 1 tablet (10 mg total) by mouth daily. 30 tablet 11   Cholecalciferol (VITAMIN D  PO) Take 2,000 Units by mouth daily.     clobetasol  cream (TEMOVATE ) 0.05 % Apply 1 Application topically daily as needed. 30 g 1   fluticasone  (FLONASE ) 50 MCG/ACT nasal spray Place 2 sprays into both nostrils daily. 16 g 2   gabapentin  (NEURONTIN ) 100 MG capsule TAKE 1 CAPSULE(100 MG) BY MOUTH THREE TIMES DAILY 180 capsule 3   glipiZIDE  (GLUCOTROL  XL) 10 MG 24 hr tablet TAKE 1 TABLET(10 MG) BY MOUTH DAILY WITH BREAKFAST 90 tablet 1   glucose blood (ACCU-CHEK GUIDE TEST) test strip USE AS DIRECTED TO CHECK BLOOD SUGAR TWICE DAILY 200 strip 3   ipratropium-albuterol  (DUONEB) 0.5-2.5 (3) MG/3ML SOLN USE 3 ML VIA NEBULIZER EVERY 6 HOURS AS NEEDED 360 mL 1   ketoconazole  (NIZORAL ) 2 % cream Apply 1 Application topically daily. 15 g 0   levothyroxine  (SYNTHROID ) 75 MCG tablet TAKE 1 TABLET(75 MCG) BY MOUTH DAILY BEFORE BREAKFAST 90 tablet 3   meloxicam  (MOBIC ) 15 MG tablet Take 1 tablet (15 mg total) by mouth daily. 60 tablet 1   metFORMIN  (GLUCOPHAGE ) 500 MG tablet TAKE  1 TABLET(500 MG) BY MOUTH TWICE DAILY WITH A MEAL 180 tablet 3   nystatin  cream (MYCOSTATIN ) APPLY TOPICALLY TO THE AFFECTED AREA TWICE DAILY 30 g 5   omeprazole (PRILOSEC) 20 MG capsule Take 20 mg by mouth daily as needed.     Multiple Vitamin (MULTIVITAMIN) capsule Take 1 capsule by mouth daily.     predniSONE  (DELTASONE ) 10 MG tablet 3 tabs by mouth daily x 3 days, then 2 tabs by mouth daily x 2 days then 1 tab by mouth daily x 2 days 15 tablet 0   predniSONE  (DELTASONE ) 5 MG tablet TAKE 1 TABLET(5 MG) BY MOUTH DAILY WITH BREAKFAST 90 tablet 1   Facility-Administered Medications Prior to Visit  Medication Dose Route Frequency Provider Last Rate Last Admin   [START ON 10/11/2024] denosumab  (PROLIA ) injection 60 mg  60 mg Subcutaneous Q6 months Teresha Hanks E, MD          Per HPI unless specifically indicated in ROS section below Review of Systems  Constitutional:  Negative for fatigue and fever.  HENT:  Negative for congestion.   Eyes:  Negative for pain.  Respiratory:  Negative for cough and shortness of breath.   Cardiovascular:  Negative for chest pain, palpitations and leg swelling.  Gastrointestinal:  Negative for abdominal pain.  Genitourinary:  Negative for dysuria and vaginal bleeding.  Musculoskeletal:  Negative for back pain.  Neurological:  Negative for syncope, light-headedness and headaches.  Psychiatric/Behavioral:  Negative for dysphoric mood.    Objective:  BP 134/78   Pulse 82   Temp 97.7 F (36.5 C) (Oral)   Ht 5' 3.8 (1.621 m)   Wt 177 lb (80.3 kg)   SpO2 98%   BMI 30.57 kg/m   Wt Readings from Last 3 Encounters:  08/14/24 177 lb (80.3 kg)  07/23/24 173 lb 14.1 oz (78.9 kg)  06/25/24 173 lb 6 oz (78.6 kg)      Physical Exam Constitutional:      General: She is not in acute distress.    Appearance: She is well-developed. She is not ill-appearing or toxic-appearing.  HENT:     Head: Normocephalic.     Right Ear: Hearing, tympanic membrane, ear canal and external ear normal. No middle ear effusion. Tympanic membrane is not erythematous, retracted or bulging.     Left Ear: Hearing, tympanic membrane, ear canal and external ear normal.  No middle ear effusion. Tympanic membrane is not erythematous, retracted or bulging.     Nose: Mucosal edema, congestion and rhinorrhea present.     Right Sinus: No maxillary sinus tenderness or frontal sinus tenderness.     Left Sinus: No maxillary sinus tenderness or frontal sinus tenderness.     Mouth/Throat:     Pharynx: Uvula midline.  Eyes:     General: Lids are normal. Lids are everted, no foreign bodies appreciated.     Conjunctiva/sclera: Conjunctivae normal.     Pupils: Pupils are equal, round, and reactive to light.  Neck:     Thyroid : No thyroid  mass or thyromegaly.      Vascular: No carotid bruit.     Trachea: Trachea normal.  Cardiovascular:     Rate and Rhythm: Normal rate and regular rhythm.     Pulses: Normal pulses.     Heart sounds: Normal heart sounds, S1 normal and S2 normal. No murmur heard.    No friction rub. No gallop.  Pulmonary:     Effort: Pulmonary effort is normal. No tachypnea or respiratory  distress.     Breath sounds: Normal breath sounds. No decreased breath sounds, wheezing, rhonchi or rales.  Musculoskeletal:     Cervical back: Normal range of motion and neck supple.  Skin:    General: Skin is warm and dry.     Findings: No rash.  Neurological:     Mental Status: She is alert.  Psychiatric:        Mood and Affect: Mood is not anxious or depressed.        Speech: Speech normal.        Behavior: Behavior normal. Behavior is cooperative.        Judgment: Judgment normal.       Results for orders placed or performed in visit on 06/25/24  CBC with Differential/Platelet   Collection Time: 06/25/24 10:33 AM  Result Value Ref Range   WBC 9.2 4.0 - 10.5 K/uL   RBC 4.53 3.87 - 5.11 Mil/uL   Hemoglobin 13.7 12.0 - 15.0 g/dL   HCT 58.4 63.9 - 53.9 %   MCV 91.5 78.0 - 100.0 fl   MCHC 33.0 30.0 - 36.0 g/dL   RDW 85.5 88.4 - 84.4 %   Platelets 341.0 150.0 - 400.0 K/uL   Neutrophils Relative % 62.7 43.0 - 77.0 %   Lymphocytes Relative 22.1 12.0 - 46.0 %   Monocytes Relative 10.5 3.0 - 12.0 %   Eosinophils Relative 3.8 0.0 - 5.0 %   Basophils Relative 0.9 0.0 - 3.0 %   Neutro Abs 5.8 1.4 - 7.7 K/uL   Lymphs Abs 2.0 0.7 - 4.0 K/uL   Monocytes Absolute 1.0 0.1 - 1.0 K/uL   Eosinophils Absolute 0.3 0.0 - 0.7 K/uL   Basophils Absolute 0.1 0.0 - 0.1 K/uL  Brain natriuretic peptide   Collection Time: 06/25/24 10:33 AM  Result Value Ref Range   Pro B Natriuretic peptide (BNP) 27.0 0.0 - 100.0 pg/mL  Comprehensive metabolic panel with GFR   Collection Time: 06/25/24 10:33 AM  Result Value Ref Range   Sodium 133 (L) 135 - 145 mEq/L    Potassium 4.7 3.5 - 5.1 mEq/L   Chloride 95 (L) 96 - 112 mEq/L   CO2 31 19 - 32 mEq/L   Glucose, Bld 118 (H) 70 - 99 mg/dL   BUN 19 6 - 23 mg/dL   Creatinine, Ser 9.26 0.40 - 1.20 mg/dL   Total Bilirubin 0.4 0.2 - 1.2 mg/dL   Alkaline Phosphatase 51 39 - 117 U/L   AST 16 0 - 37 U/L   ALT 12 0 - 35 U/L   Total Protein 6.7 6.0 - 8.3 g/dL   Albumin 4.3 3.5 - 5.2 g/dL   GFR 28.38 >39.99 mL/min   Calcium 9.2 8.4 - 10.5 mg/dL  TSH   Collection Time: 06/25/24 10:33 AM  Result Value Ref Range   TSH 2.31 0.35 - 5.50 uIU/mL    Assessment and Plan  Acute non-recurrent maxillary sinusitis Assessment & Plan:  Acute, most consistent with allergic or viral sinusitis.  No clear sign of bacterial infection.  Will increase prednisone  taper for 7 days then return to baseline prednisone  5 mg daily. She will continue using nasal saline as well as Flonase  2 sprays per nostril daily. She will continue using allergy medication. Her lung exam in office today is clear but she feels that she may be heading towards a COPD exacerbation.  She has albuterol  nebs and inhaler to use as needed.  She will continue Breo elliptica.  Return and ER precautions provided.   Other orders -     predniSONE ; Take 1 tablet (5 mg total) by mouth daily with breakfast. -     predniSONE ; 3 tabs by mouth daily x 3 days, then 2 tabs by mouth daily x 2 days then 1 tab by mouth daily x 2 days  Dispense: 15 tablet; Refill: 0    No follow-ups on file.   Greig Ring, MD

## 2024-08-14 NOTE — Assessment & Plan Note (Signed)
 Acute, most consistent with allergic or viral sinusitis.  No clear sign of bacterial infection.  Will increase prednisone  taper for 7 days then return to baseline prednisone  5 mg daily. She will continue using nasal saline as well as Flonase  2 sprays per nostril daily. She will continue using allergy medication. Her lung exam in office today is clear but she feels that she may be heading towards a COPD exacerbation.  She has albuterol  nebs and inhaler to use as needed.  She will continue Breo elliptica.   Return and ER precautions provided.

## 2024-08-22 ENCOUNTER — Telehealth: Payer: Self-pay | Admitting: *Deleted

## 2024-08-22 NOTE — Telephone Encounter (Signed)
 Copied from CRM #[id number]. Topic: Clinical - Medication Question >> Aug 22, 2024  8:17 AM Carlyon D wrote: Reason for CRM: Pt is calling in regards to a vaccine question she is going for the flu shot and the pneumonia shot pt states she is suppose to get the RSV as well but pharmacy is telling her she does not need it pt would like some one to call her and let her know if she in fact does need the RSV or she does not need it

## 2024-08-22 NOTE — Telephone Encounter (Signed)
 Left message for Megan Bradshaw letting her know her record shows she has already had the RSV vaccine and it is a one time vaccine so she does not need another RSV vaccine.  I ask that she call me back if she has any questions.

## 2024-08-22 NOTE — Telephone Encounter (Signed)
 Patient return call and has been made aware of notations; no additional questions.

## 2024-09-08 ENCOUNTER — Other Ambulatory Visit: Payer: Self-pay | Admitting: Family Medicine

## 2024-09-09 ENCOUNTER — Ambulatory Visit: Payer: Self-pay

## 2024-09-09 NOTE — Telephone Encounter (Signed)
 Last office visit 08/14/24 for sinusitis.  Last refilled ?03/11/2024 for #90 with 1 refill.  Next Appt: 09/10/24 for leg pain and dysuria.

## 2024-09-09 NOTE — Telephone Encounter (Signed)
 FYI Only or Action Required?: FYI only for provider.  Patient was last seen in primary care on 08/14/2024 by Avelina Greig BRAVO, MD.  Called Nurse Triage reporting Dysuria. And leg pain and swelling at knee - same leg as knee replacement 2007  Symptoms began several months ago for knee pain, Dysuria friday  Interventions attempted: Nothing.  Symptoms are: unchanged.  Triage Disposition: See Physician Within 24 Hours  Patient/caregiver understands and will follow disposition?: Yes - appt for tomorrow scheduled.                               Copied from CRM #[id number]. Topic: Clinical - Red Word Triage >> Sep 09, 2024  8:05 AM Megan Bradshaw wrote: Red Word that prompted transfer to Nurse Triage: UTI- not feeling well, a little burn but very light and legs are hurting. Reason for Disposition  Age > 50 years  Answer Assessment - Initial Assessment Questions 1. SEVERITY: How bad is the pain?  (e.g., Scale 1-10; mild, moderate, or severe)     Urinary discomfort - leg pain - 8/10 2. FREQUENCY: How many times have you had painful urination today?      many 3. PATTERN: Is pain present every time you urinate or just sometimes?      Each time - comes and goes 4. ONSET: When did the painful urination start?      Friday 5. FEVER: Do you have a fever? If Yes, ask: What is your temperature, how was it measured, and when did it start?     no 6. PAST UTI: Have you had a urine infection before? If Yes, ask: When was the last time? and What happened that time?      Long time ago 7. CAUSE: What do you think is causing the painful urination?  (e.g., UTI, scratch, Herpes sore)     Possible UTI 8. OTHER SYMPTOMS: Do you have any other symptoms? (e.g., blood in urine, flank pain, genital sores, urgency, vaginal discharge)     Leg pain - 2007 - left leg  Protocols used: Urination Pain - Female-A-AH

## 2024-09-10 ENCOUNTER — Telehealth: Payer: Self-pay

## 2024-09-10 ENCOUNTER — Encounter: Payer: Self-pay | Admitting: Family Medicine

## 2024-09-10 ENCOUNTER — Other Ambulatory Visit (HOSPITAL_COMMUNITY): Payer: Self-pay

## 2024-09-10 ENCOUNTER — Ambulatory Visit: Admitting: Family Medicine

## 2024-09-10 VITALS — BP 144/90 | HR 82 | Temp 99.0°F | Ht 63.8 in | Wt 176.4 lb

## 2024-09-10 DIAGNOSIS — R051 Acute cough: Secondary | ICD-10-CM | POA: Diagnosis not present

## 2024-09-10 DIAGNOSIS — M791 Myalgia, unspecified site: Secondary | ICD-10-CM | POA: Insufficient documentation

## 2024-09-10 DIAGNOSIS — R300 Dysuria: Secondary | ICD-10-CM | POA: Diagnosis not present

## 2024-09-10 LAB — CBC WITH DIFFERENTIAL/PLATELET
Basophils Absolute: 0.1 K/uL (ref 0.0–0.1)
Basophils Relative: 0.8 % (ref 0.0–3.0)
Eosinophils Absolute: 0.1 K/uL (ref 0.0–0.7)
Eosinophils Relative: 1.2 % (ref 0.0–5.0)
HCT: 40.2 % (ref 36.0–46.0)
Hemoglobin: 13.5 g/dL (ref 12.0–15.0)
Lymphocytes Relative: 18.4 % (ref 12.0–46.0)
Lymphs Abs: 1.4 K/uL (ref 0.7–4.0)
MCHC: 33.5 g/dL (ref 30.0–36.0)
MCV: 91.1 fl (ref 78.0–100.0)
Monocytes Absolute: 0.5 K/uL (ref 0.1–1.0)
Monocytes Relative: 7.1 % (ref 3.0–12.0)
Neutro Abs: 5.5 K/uL (ref 1.4–7.7)
Neutrophils Relative %: 72.5 % (ref 43.0–77.0)
Platelets: 341 K/uL (ref 150.0–400.0)
RBC: 4.41 Mil/uL (ref 3.87–5.11)
RDW: 13.7 % (ref 11.5–15.5)
WBC: 7.6 K/uL (ref 4.0–10.5)

## 2024-09-10 LAB — POC COVID19 BINAXNOW: SARS Coronavirus 2 Ag: NEGATIVE

## 2024-09-10 LAB — POC URINALSYSI DIPSTICK (AUTOMATED)
Bilirubin, UA: NEGATIVE
Glucose, UA: NEGATIVE
Ketones, UA: NEGATIVE
Nitrite, UA: POSITIVE
Protein, UA: NEGATIVE
Spec Grav, UA: 1.01 (ref 1.010–1.025)
Urobilinogen, UA: 0.2 U/dL
pH, UA: 6 (ref 5.0–8.0)

## 2024-09-10 LAB — CK: Total CK: 33 U/L (ref 17–177)

## 2024-09-10 LAB — COMPREHENSIVE METABOLIC PANEL WITH GFR
ALT: 12 U/L (ref 0–35)
AST: 16 U/L (ref 0–37)
Albumin: 4.1 g/dL (ref 3.5–5.2)
Alkaline Phosphatase: 53 U/L (ref 39–117)
BUN: 16 mg/dL (ref 6–23)
CO2: 31 meq/L (ref 19–32)
Calcium: 9.3 mg/dL (ref 8.4–10.5)
Chloride: 96 meq/L (ref 96–112)
Creatinine, Ser: 0.67 mg/dL (ref 0.40–1.20)
GFR: 75.99 mL/min (ref >60.00)
Glucose, Bld: 122 mg/dL — ABNORMAL HIGH (ref 70–99)
Potassium: 4.3 meq/L (ref 3.5–5.1)
Sodium: 135 meq/L (ref 135–145)
Total Bilirubin: 0.4 mg/dL (ref 0.2–1.2)
Total Protein: 6.6 g/dL (ref 6.0–8.3)

## 2024-09-10 LAB — POCT INFLUENZA A/B
Influenza A, POC: NEGATIVE
Influenza B, POC: NEGATIVE

## 2024-09-10 MED ORDER — AMOXICILLIN-POT CLAVULANATE 875-125 MG PO TABS: 1.0000 | ORAL_TABLET | Freq: Two times a day (BID) | ORAL | 0 refills | Status: DC

## 2024-09-10 NOTE — Telephone Encounter (Signed)
 Pt ready for scheduling for PROLIA  on or after : 10/11/24  Option# 1: Buy/Bill (Office supplied medication)  Out-of-pocket cost due at time of clinic visit: $0  Number of injection/visits approved: ---  Primary: MEDICARE Prolia  co-insurance: 0% Admin fee co-insurance: 0%  Secondary: [employer] Prolia  co-insurance:  Admin fee co-insurance:   Medical Benefit Details: Date Benefits were checked: 09/10/24 Deductible: $257 Met of $257 Required/ Coinsurance: 0%/ Admin Fee: 0%  Prior Auth: N/A PA# Expiration Date:   # of doses approved: ----------------------------------------------------------------------- Option# 2- Med Obtained from pharmacy:  Pharmacy benefit: Copay $170 (Paid to pharmacy) Admin Fee: 0% (Pay at clinic)  Prior Auth: N/A PA# Expiration Date:   # of doses approved:   If patient wants fill through the pharmacy benefit please send prescription to: [facility], and include estimated need by date in rx notes. Pharmacy will ship medication directly to the office.  Patient NOT eligible for Prolia  Copay Card. Copay Card can make patient's cost as little as $25. Link to apply: [email or web address]  ** This summary of benefits is an estimation of the patient's out-of-pocket cost. Exact cost may very based on individual plan coverage.

## 2024-09-10 NOTE — Progress Notes (Signed)
 Patient ID: Myriah Haycraft, female    DOB: 06/29/1930, 89 y.o.   MRN: [id number]  This visit was conducted in person.  BP (!) 144/90   Pulse 82   Temp 99 F (37.2 C) (Temporal)   Ht 5' 3.8 (1.621 m)   Wt 176 lb 6 oz (80 kg)   SpO2 96%   BMI 30.46 kg/m    CC:  Chief Complaint  Patient presents with   Leg Pain    Left   Dysuria   Skin Lesion    Under Left Arm    Shortness of Breath    Patient states she feels terrible    Nasal Congestion    Subjective:   HPI: Halleigh Comes is a 89 y.o. female presenting on 09/10/2024 for Leg Pain (Left), Dysuria, Skin Lesion (Under Left Arm/), Shortness of Breath (Patient states she feels terrible/), and Nasal Congestion    Multiple issues   Bilateral foot pain.. reviewed 07/2024 Podiatry note  On Meloxicam  15 mg daily x 3 weeks. For foot pain with  peripheral polyneuropathy, DJD bilateral feet, bunion deformity and hammertoes  Was taking ibuprofen prior  No swelling in legs.  Has intermittent pain in legs for a long time, but more painful lately.SABRASABRAFeeling bad all over, achy. Just does not feel right. History of  peripheral neuropathy.   Dysuria off and on, increase in urine output at night ongoing for 1 week... pain at start of stream.    Runny nose , dry cough, worsening SOB x  4-5 days  Low grade temp in office today  Breo, albuterol   No OTC meds.  BP Readings from Last 3 Encounters:  09/10/24 (!) 144/90  08/14/24 134/78  06/25/24 (!) 140/80    intolerance to  Augmentin .. cause diarrhea in past... pt willing to retry.     Relevant past medical, surgical, family and social history reviewed and updated as indicated. Interim medical history since our last visit reviewed. Allergies and medications reviewed and updated. Outpatient Medications Prior to Visit  Medication Sig Dispense Refill   Accu-Chek Softclix Lancets lancets USE TO CHECK BLOOD SUGAR ONCE A DAY 100 each 3   albuterol  (PROVENTIL ) (2.5 MG/3ML) 0.083%  nebulizer solution Take 3 mLs (2.5 mg total) by nebulization every 4 (four) hours as needed for wheezing or shortness of breath. 150 mL 5   albuterol  (VENTOLIN  HFA) 108 (90 Base) MCG/ACT inhaler INHALE 1 TO 2 PUFFS BY MOUTH EVERY 6 HOURS FOR PAIN OR WHEEZING OR SHORTNESS OF BREATH 18 g 2   B Complex-Folic Acid (BENFOTIAMINE MULTI-B) CAPS Take 1 capsule by mouth daily. 30 capsule 6   Blood Glucose Monitoring Suppl (ACCU-CHEK GUIDE ME) w/Device KIT See admin instructions.     BREO ELLIPTA  100-25 MCG/ACT AEPB Inhale 1 puff into the lungs daily. 180 each 3   cetirizine  (ZYRTEC  ALLERGY) 10 MG tablet Take 1 tablet (10 mg total) by mouth daily. 30 tablet 11   Cholecalciferol (VITAMIN D  PO) Take 2,000 Units by mouth daily.     clobetasol  cream (TEMOVATE ) 0.05 % Apply 1 Application topically daily as needed. 30 g 1   fluticasone  (FLONASE ) 50 MCG/ACT nasal spray Place 2 sprays into both nostrils daily. 16 g 2   gabapentin  (NEURONTIN ) 100 MG capsule TAKE 1 CAPSULE(100 MG) BY MOUTH THREE TIMES DAILY 180 capsule 3   glipiZIDE  (GLUCOTROL  XL) 10 MG 24 hr tablet TAKE 1 TABLET(10 MG) BY MOUTH DAILY WITH BREAKFAST 90 tablet 1   glucose blood (ACCU-CHEK GUIDE  TEST) test strip USE AS DIRECTED TO CHECK BLOOD SUGAR TWICE DAILY 200 strip 3   ipratropium-albuterol  (DUONEB) 0.5-2.5 (3) MG/3ML SOLN USE 3 ML VIA NEBULIZER EVERY 6 HOURS AS NEEDED 360 mL 1   ketoconazole  (NIZORAL ) 2 % cream Apply 1 Application topically daily. 15 g 0   levothyroxine  (SYNTHROID ) 75 MCG tablet TAKE 1 TABLET(75 MCG) BY MOUTH DAILY BEFORE BREAKFAST 90 tablet 3   meloxicam  (MOBIC ) 15 MG tablet Take 1 tablet (15 mg total) by mouth daily. 60 tablet 1   metFORMIN  (GLUCOPHAGE ) 500 MG tablet TAKE 1 TABLET(500 MG) BY MOUTH TWICE DAILY WITH A MEAL 180 tablet 3   nystatin  cream (MYCOSTATIN ) APPLY TOPICALLY TO THE AFFECTED AREA TWICE DAILY 30 g 5   omeprazole (PRILOSEC) 20 MG capsule Take 20 mg by mouth daily as needed.     predniSONE  (DELTASONE ) 5 MG  tablet Take 1 tablet (5 mg total) by mouth daily with breakfast.     predniSONE  (DELTASONE ) 10 MG tablet 3 tabs by mouth daily x 3 days, then 2 tabs by mouth daily x 2 days then 1 tab by mouth daily x 2 days 15 tablet 0   Facility-Administered Medications Prior to Visit  Medication Dose Route Frequency Provider Last Rate Last Admin   [START ON 10/11/2024] denosumab  (PROLIA ) injection 60 mg  60 mg Subcutaneous Q6 months Dua Mehler E, MD         Per HPI unless specifically indicated in ROS section below Review of Systems  Constitutional:  Negative for fatigue and fever.  HENT:  Positive for congestion.   Eyes:  Negative for pain.  Respiratory:  Positive for cough and shortness of breath.   Cardiovascular:  Negative for chest pain and palpitations.  Gastrointestinal:  Negative for abdominal pain.  Genitourinary:  Positive for dysuria and frequency. Negative for vaginal bleeding.  Musculoskeletal:  Positive for myalgias. Negative for back pain.  Neurological:  Negative for syncope, light-headedness and headaches.  Psychiatric/Behavioral:  Negative for dysphoric mood.    Objective:  BP (!) 144/90   Pulse 82   Temp 99 F (37.2 C) (Temporal)   Ht 5' 3.8 (1.621 m)   Wt 176 lb 6 oz (80 kg)   SpO2 96%   BMI 30.46 kg/m   Wt Readings from Last 3 Encounters:  09/10/24 176 lb 6 oz (80 kg)  08/14/24 177 lb (80.3 kg)  07/23/24 173 lb 14.1 oz (78.9 kg)      Physical Exam Constitutional:      General: She is not in acute distress.    Appearance: Normal appearance. She is well-developed. She is not ill-appearing or toxic-appearing.  HENT:     Head: Normocephalic.     Right Ear: Hearing, tympanic membrane, ear canal and external ear normal. Tympanic membrane is not erythematous, retracted or bulging.     Left Ear: Hearing, tympanic membrane, ear canal and external ear normal. Tympanic membrane is not erythematous, retracted or bulging.     Nose: No mucosal edema or rhinorrhea.     Right  Sinus: No maxillary sinus tenderness or frontal sinus tenderness.     Left Sinus: No maxillary sinus tenderness or frontal sinus tenderness.     Mouth/Throat:     Pharynx: Uvula midline.  Eyes:     General: Lids are normal. Lids are everted, no foreign bodies appreciated.     Conjunctiva/sclera: Conjunctivae normal.     Pupils: Pupils are equal, round, and reactive to light.  Neck:  Thyroid : No thyroid  mass or thyromegaly.     Vascular: No carotid bruit.     Trachea: Trachea normal.  Cardiovascular:     Rate and Rhythm: Normal rate and regular rhythm.     Pulses: Normal pulses.     Heart sounds: Normal heart sounds, S1 normal and S2 normal. No murmur heard.    No friction rub. No gallop.  Pulmonary:     Effort: Pulmonary effort is normal. No tachypnea or respiratory distress.     Breath sounds: Normal breath sounds. No decreased breath sounds, wheezing, rhonchi or rales.  Abdominal:     General: Bowel sounds are normal.     Palpations: Abdomen is soft.     Tenderness: There is no abdominal tenderness.  Musculoskeletal:     Cervical back: Normal range of motion and neck supple.     Comments: Patient tender to palpation over bilateral lower extremities from knee to foot, unable to examine as any light touch causes patient to pull legs away. No redness or swelling  Skin:    General: Skin is warm and dry.     Findings: No rash.  Neurological:     Mental Status: She is alert.  Psychiatric:        Mood and Affect: Mood is not anxious or depressed.        Speech: Speech normal.        Behavior: Behavior normal. Behavior is cooperative.        Thought Content: Thought content normal.        Judgment: Judgment normal.       Results for orders placed or performed in visit on 09/10/24  POCT Urinalysis Dipstick (Automated)   Collection Time: 09/10/24 12:32 PM  Result Value Ref Range   Color, UA Yellow    Clarity, UA Clear    Glucose, UA Negative Negative   Bilirubin, UA  Negative    Ketones, UA Negative    Spec Grav, UA 1.010 1.010 - 1.025   Blood, UA Trace    pH, UA 6.0 5.0 - 8.0   Protein, UA Negative Negative   Urobilinogen, UA 0.2 0.2 or 1.0 E.U./dL   Nitrite, UA Positive    Leukocytes, UA Small (1+) (A) Negative  Comprehensive metabolic panel with GFR   Collection Time: 09/10/24  1:23 PM  Result Value Ref Range   Sodium 135 135 - 145 mEq/L   Potassium 4.3 3.5 - 5.1 mEq/L   Chloride 96 96 - 112 mEq/L   CO2 31 19 - 32 mEq/L   Glucose, Bld 122 (H) 70 - 99 mg/dL   BUN 16 6 - 23 mg/dL   Creatinine, Ser 9.32 0.40 - 1.20 mg/dL   Total Bilirubin 0.4 0.2 - 1.2 mg/dL   Alkaline Phosphatase 53 39 - 117 U/L   AST 16 0 - 37 U/L   ALT 12 0 - 35 U/L   Total Protein 6.6 6.0 - 8.3 g/dL   Albumin 4.1 3.5 - 5.2 g/dL   GFR 24.00 >39.99 mL/min   Calcium 9.3 8.4 - 10.5 mg/dL  CBC with Differential/Platelet   Collection Time: 09/10/24  1:23 PM  Result Value Ref Range   WBC 7.6 4.0 - 10.5 K/uL   RBC 4.41 3.87 - 5.11 Mil/uL   Hemoglobin 13.5 12.0 - 15.0 g/dL   HCT 59.7 63.9 - 53.9 %   MCV 91.1 78.0 - 100.0 fl   MCHC 33.5 30.0 - 36.0 g/dL   RDW 86.2 88.4 -  15.5 %   Platelets 341.0 150.0 - 400.0 K/uL   Neutrophils Relative % 72.5 43.0 - 77.0 %   Lymphocytes Relative 18.4 12.0 - 46.0 %   Monocytes Relative 7.1 3.0 - 12.0 %   Eosinophils Relative 1.2 0.0 - 5.0 %   Basophils Relative 0.8 0.0 - 3.0 %   Neutro Abs 5.5 1.4 - 7.7 K/uL   Lymphs Abs 1.4 0.7 - 4.0 K/uL   Monocytes Absolute 0.5 0.1 - 1.0 K/uL   Eosinophils Absolute 0.1 0.0 - 0.7 K/uL   Basophils Absolute 0.1 0.0 - 0.1 K/uL  CK   Collection Time: 09/10/24  1:23 PM  Result Value Ref Range   Total CK 33 17 - 177 U/L  POC COVID-19 BinaxNow   Collection Time: 09/10/24  1:23 PM  Result Value Ref Range   SARS Coronavirus 2 Ag Negative Negative  POCT Influenza A/B   Collection Time: 09/10/24  1:23 PM  Result Value Ref Range   Influenza A, POC Negative Negative   Influenza B, POC Negative Negative     Assessment and Plan  Dysuria Assessment & Plan:  Acute, possible UTI given UA showing nitrate LE and trace blood. Could be cause of overall ill feeling. Low grade temp. Dysuria if culture negative may also be due to new start meloxicam  SE. Will send urine for culture. Will treat empirically for possible acute cystitis with Augmentin   to also cover concomitantl upper respiratory tract infection.  Orders: -     POCT Urinalysis Dipstick (Automated) -     Urine Culture  Acute cough Assessment & Plan:  Acute, likely viral URI, lung exam clear... no clear sign of COPD exacerbation.  Eval with COVID and flu testing in office:  Negative.  Concern for possible upper respiratory tract infection, possible bacterial infection given ongoing. Patient will continue chronic prednisone .  Orders: -     POC COVID-19 BinaxNow -     POCT Influenza A/B  Myalgia Assessment & Plan:  Pt poor historian.. unclear if chronic or more acute with current illness... will eval with labs CMET, cbc, CK.  Orders: -     Comprehensive metabolic panel with GFR -     CBC with Differential/Platelet -     CK -     POC COVID-19 BinaxNow -     POCT Influenza A/B  Other orders -     Amoxicillin -Pot Clavulanate; Take 1 tablet by mouth 2 (two) times daily.  Dispense: 14 tablet; Refill: 0    No follow-ups on file.   Greig Ring, MD

## 2024-09-10 NOTE — Assessment & Plan Note (Addendum)
 Acute, possible UTI given UA showing nitrate LE and trace blood. Could be cause of overall ill feeling. Low grade temp. Dysuria if culture negative may also be due to new start meloxicam  SE. Will send urine for culture. Will treat empirically for possible acute cystitis with Augmentin   to also cover concomitantl upper respiratory tract infection.

## 2024-09-10 NOTE — Telephone Encounter (Signed)
 SABRA

## 2024-09-10 NOTE — Telephone Encounter (Signed)
 Patient has appointment with Dr. Avelina 09/10/2024 at 12:00 pm.

## 2024-09-10 NOTE — Assessment & Plan Note (Signed)
 Pt poor historian.. unclear if chronic or more acute with current illness... will eval with labs CMET, cbc, CK.

## 2024-09-10 NOTE — Telephone Encounter (Signed)
 Prolia  VOB initiated via [email or web address]  Next Prolia  inj DUE: 10/11/24

## 2024-09-10 NOTE — Assessment & Plan Note (Addendum)
 Acute, likely viral URI, lung exam clear... no clear sign of COPD exacerbation.  Eval with COVID and flu testing in office:  Negative.  Concern for possible upper respiratory tract infection, possible bacterial infection given ongoing. Patient will continue chronic prednisone .

## 2024-09-12 ENCOUNTER — Ambulatory Visit: Payer: Self-pay | Admitting: Family Medicine

## 2024-09-13 LAB — URINE CULTURE
MICRO NUMBER:: 17157679
SPECIMEN QUALITY:: ADEQUATE

## 2024-09-13 MED ORDER — CIPROFLOXACIN HCL 250 MG PO TABS: 250.0000 mg | ORAL_TABLET | Freq: Two times a day (BID) | ORAL | 0 refills | Status: AC

## 2024-09-18 ENCOUNTER — Ambulatory Visit: Payer: Self-pay

## 2024-09-18 MED ORDER — FLUCONAZOLE 150 MG PO TABS: ORAL_TABLET | ORAL | 0 refills | Status: DC

## 2024-09-18 NOTE — Telephone Encounter (Signed)
 Given recent antibiotics, it is likely she has a candidal yeast infection vaginally.  I will send in a prescription for fluconazole . If she is still not improving she needs to return for an appointment in person.

## 2024-09-18 NOTE — Telephone Encounter (Signed)
 FYI Only or Action Required?: Action required by provider: Medication request.  Patient was last seen in primary care on 09/10/2024 by Avelina Greig BRAVO, MD.  Called Nurse Triage reporting Rash.  Symptoms began a week ago.  Interventions attempted: Other: Vaseline.  Symptoms are: gradually worsening.  Triage Disposition: See PCP When Office is Open (Within 3 Days)  Patient/caregiver understands and will follow disposition?: No, wishes to speak with PCP      Message from [facility] C sent at 09/18/2024 10:39 AM EST  Summary: UTI concerns   Reason for Triage: Patient had UTI doesnt know if it is gone or not. Patient has rash, that's raw back there, and doesnt know what she can put on to make it better. Patient has been putting Vaseline but nothing is helping.   [phone] (M)         Reason for Disposition  Localized rash present > 7 days  Answer Assessment - Initial Assessment Questions Using Vaseline and keeping area dry for symptoms. She states she does not want to be seen in office and is just requesting a medication to help with symptoms. Preferred pharmacy below and requesting a call back regarding her request.    [id number] [place], [place] - [address] AT [facility] [address] [facility] [address]  [address] [place], [place] [address]    1. APPEARANCE of RASH: What does the rash look like? (e.g., blisters, dry flaky skin, red spots, redness, sores)     Unsure can just feel it  2. LOCATION: Where is the rash located?      On her buttocks area  3. ONSET: When did the rash start?      1 week  4. ITCHING: Does the rash itch? If Yes, ask: How bad is the itch?  (Scale 0-10; or none, mild, moderate, severe)     Mild  5. PAIN: Does the rash hurt? If Yes, ask: How bad is the pain?  (Scale 0-10; or none, mild, moderate, severe)     Varies from 5-10  6. OTHER SYMPTOMS: Do you have any other symptoms? (e.g., fever)     Denies  Protocols  used: Rash or Redness - Localized-A-AH

## 2024-09-18 NOTE — Addendum Note (Signed)
 Addended by: AVELINA NO E on: 09/18/2024 02:00 PM   Modules accepted: Orders

## 2024-09-18 NOTE — Telephone Encounter (Signed)
 LMOM informing Pt that PCP has sent fluconazole  for yeast infection, to schedule an in person visit if sx's not improving.

## 2024-09-19 ENCOUNTER — Telehealth: Payer: Self-pay | Admitting: Family Medicine

## 2024-09-19 MED ORDER — FLUCONAZOLE 150 MG PO TABS: ORAL_TABLET | ORAL | 0 refills | Status: DC

## 2024-09-19 NOTE — Telephone Encounter (Signed)
 Copied from CRM [id number]. Topic: Clinical - Pink Word Triage >> Sep 18, 2024 10:38 AM Rea BROCKS wrote: Dawne Word triggered transfer to Nurse Triage. See Triage Message for details. >> Sep 19, 2024  8:06 AM Franky GRADE wrote: Patient was informed that fluconazole  (DIFLUCAN ) 150 MG tablet , was sent in yesterday; however, she called the pharmacy to see if it was ready for pick up and they advised they had not received the prescription. She would like for us  to resend the prescription.  >> Sep 18, 2024 10:39 AM Rea BROCKS wrote: Reason for Triage: Patient had UTI doesnt know if it is gone or not. Patient has rash, that's raw back there, and doesnt know what she can put on to make it better. Patient has been putting Vaseline but nothing is helping.    [phone] BENNIE)

## 2024-09-19 NOTE — Telephone Encounter (Signed)
 Spoke with pt. Advised her that I resent her prescription.

## 2024-09-22 ENCOUNTER — Other Ambulatory Visit: Payer: Self-pay | Admitting: Podiatry

## 2024-09-25 ENCOUNTER — Other Ambulatory Visit: Payer: Self-pay | Admitting: Family Medicine

## 2024-09-25 DIAGNOSIS — J449 Chronic obstructive pulmonary disease, unspecified: Secondary | ICD-10-CM

## 2024-10-02 ENCOUNTER — Telehealth: Payer: Self-pay

## 2024-10-02 NOTE — Telephone Encounter (Signed)
 Spoke with pt. She was returning my call to schedule her prolia  injection. This has been taken care of.

## 2024-10-02 NOTE — Telephone Encounter (Signed)
 Copied from CRM #[id number]. Topic: General - Call Back - No Documentation >> Oct 02, 2024  2:50 PM Roselie BROCKS wrote: Reason for CRM: Patient states she is returning a missed call, I do not show any notes of a call today November 19th. >> Oct 02, 2024  3:00 PM Viola F wrote: Patient called back, says she's returning Lindsay's call

## 2024-10-09 ENCOUNTER — Ambulatory Visit: Admitting: Pulmonary Disease

## 2024-10-15 ENCOUNTER — Ambulatory Visit

## 2024-10-15 DIAGNOSIS — M810 Age-related osteoporosis without current pathological fracture: Secondary | ICD-10-CM | POA: Diagnosis not present

## 2024-10-15 MED ORDER — DENOSUMAB 60 MG/ML ~~LOC~~ SOSY: 60.0000 mg | PREFILLED_SYRINGE | Freq: Once | SUBCUTANEOUS | Status: AC

## 2024-10-15 NOTE — Progress Notes (Addendum)
 Per orders of Dr. Greig Ring, injection of Prolia   given by Clotilda SHAUNNA Pander, CMA in right Right arm Patient tolerated injection well.

## 2024-10-17 ENCOUNTER — Encounter: Payer: Self-pay | Admitting: Pulmonary Disease

## 2024-10-17 ENCOUNTER — Ambulatory Visit: Admitting: Pulmonary Disease

## 2024-10-17 VITALS — BP 134/84 | HR 79 | Temp 97.9°F | Ht 63.5 in | Wt 178.0 lb

## 2024-10-17 DIAGNOSIS — G4736 Sleep related hypoventilation in conditions classified elsewhere: Secondary | ICD-10-CM

## 2024-10-17 DIAGNOSIS — R0602 Shortness of breath: Secondary | ICD-10-CM

## 2024-10-17 DIAGNOSIS — Z6831 Body mass index (BMI) 31.0-31.9, adult: Secondary | ICD-10-CM

## 2024-10-17 DIAGNOSIS — J4489 Other specified chronic obstructive pulmonary disease: Secondary | ICD-10-CM

## 2024-10-17 DIAGNOSIS — E66811 Obesity, class 1: Secondary | ICD-10-CM | POA: Diagnosis not present

## 2024-10-17 DIAGNOSIS — J449 Chronic obstructive pulmonary disease, unspecified: Secondary | ICD-10-CM

## 2024-10-17 NOTE — Progress Notes (Signed)
 Subjective:    Patient ID: Megan Bradshaw, female    DOB: Dec 09, 1929, 89 y.o.   MRN: [id number]  Patient Care Team: Avelina Greig BRAVO, MD as PCP - General (Family Medicine) Tamea Dedra CROME, MD as Consulting Physician (Pulmonary Disease) Jaye Fallow, MD as Referring Physician (Ophthalmology)  Chief Complaint  Patient presents with   COPD    Shortness of breath on exertion.     BACKGROUND/INTERVAL:Megan Bradshaw is an 89 year old former smoker (45 PY) who presents for follow-up on the issue of dyspnea and GOLD class II COPD.  Dyspnea out of proportion to her COPD.  This is a scheduled visit, she was last seen on 01 May 2024 by me.  She has been compliant with overnight oxygen  and notices feeling refreshed in the morning and less fatigue during the day, this in turn has helped her dyspnea.  She presents today for scheduled follow-up visit.   HPI Discussed the use of AI scribe software for clinical note transcription with the patient, who gave verbal consent to proceed.  History of Present Illness   Megan Bradshaw is a 89 year old female with COPD who presents with shortness of breath.  She experiences shortness of breath. She uses Breo every morning and occasionally uses a rescue inhaler, particularly when her foot pain is severe. No frequent coughing, with occasional coughs attributed to nasal drainage. She uses saline nasal spray daily, finding it more effective than other treatments.  She describes significant foot pain due to neuropathy, which was particularly severe the previous day. She wears diabetic shoes but finds them cumbersome. She has tried other shoes with better support but finds them difficult to put on.  Her past medical history includes arthritis and osteoporosis, for which she recently received a Prolia  injection. She has received her flu and pneumonia vaccinations and has had the RSV vaccine twice. She has opted not to receive the COVID-19 vaccine.  Does not endorse  any fevers, chills or sweats.  No dyspnea over baseline.  Overall she feels well and looks well.    DATA: 10/22/14 PFTs: mild obstruction, FEV1 1.36 L (81%), FEV1/FVC 53%, TLC normal, DLCO 51% pred 02/16/16 PFTs: FVC: 2.23 L (90 %pred), FEV1: 1.30 L (71 %pred), FEV1/FVC: 58% , TLC: invalid, DLCO 78% pred, consistent with mild/moderate obstructive defect 10/14/2019 2D echo: LVEF 50 to 55%, normal diastolics, normal pulmonary artery systolic pressure 03/31/2020 overnight oximetry on RA: No oxygen  desaturations of clinical significance. Lowest O2 sat 88% of less than 2 minutes duration.  Average O2 sat 93% 04/28/2022 overnight oximetry on RA: Patient qualified for nocturnal oxygen .  Basal SPO2 was 91% nadir to 87% total of 216 oxygen  desaturation events, ODI of 21 06/02/2022 PFTs: FEV1 1.12 L or 78% predicted, FVC is 2.15 L or 110% predicted.  FEV1/FVC 52%, no bronchodilator response. Lung volumes were normal.  Diffusion capacity moderately reduced.  Consistent with moderately severe obstructive airways disease, restriction probable likely due to obesity.  Moderately severe diffusion defect/abnormality. Since the prior study of 02/16/19/2017 there was a decrease in FEV1 by 180 mL. 06/27/2022 echocardiogram: LVEF 55%, normal wall motion abnormality, grade 1 diastolic dysfunction, RV function normal.  No valvular abnormalities.  No evidence of pulmonary hypertension.  Review of Systems A 10 point review of systems was performed and it is as noted above otherwise negative.   Patient Active Problem List   Diagnosis Date Noted   Acute cough 09/10/2024   Myalgia 09/10/2024   Bilateral foot pain 06/25/2024   Age-related  osteoporosis without current pathological fracture 03/13/2024   Hyperlipidemia associated with type 2 diabetes mellitus (HCC) 01/31/2024   Pre-ulcerative calluses 01/19/2023   Vertigo 12/13/2022   Dyspnea on exertion 03/03/2022   Dysuria 03/03/2022   Left lower quadrant abdominal pain  03/03/2022   Neuropathy due to type 2 diabetes mellitus (HCC) 03/26/2021   Elevated BP without diagnosis of hypertension 06/07/2018   Arthritis 12/07/2017   OAB (overactive bladder) 09/01/2016   Seasonal allergies 09/01/2016   Acute non-recurrent maxillary sinusitis 05/21/2015   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) 09/22/2014   Hypothyroid 09/22/2014   COPD mixed type (HCC) 08/12/2014    Social History   Tobacco Use   Smoking status: Former    Current packs/day: 0.00    Average packs/day: 1.5 packs/day for 30.0 years (45.0 ttl pk-yrs)    Types: Cigarettes    Start date: 11/14/1957    Quit date: 11/15/1987    Years since quitting: 36.9   Smokeless tobacco: Never  Substance Use Topics   Alcohol use: No    Alcohol/week: 0.0 standard drinks of alcohol    Allergies  Allergen Reactions   Myrbetriq  [Mirabegron  Er] Other (See Comments)    GI Problem   Augmentin  [Amoxicillin -Pot Clavulanate] Diarrhea    Current Meds  Medication Sig   Accu-Chek Softclix Lancets lancets USE TO CHECK BLOOD SUGAR ONCE A DAY   albuterol  (PROVENTIL ) (2.5 MG/3ML) 0.083% nebulizer solution Take 3 mLs (2.5 mg total) by nebulization every 4 (four) hours as needed for wheezing or shortness of breath.   albuterol  (VENTOLIN  HFA) 108 (90 Base) MCG/ACT inhaler INHALE 1 TO 2 PUFFS BY MOUTH EVERY 6 HOURS FOR PAIN OR WHEEZING OR SHORTNESS OF BREATH   amoxicillin -clavulanate (AUGMENTIN ) 875-125 MG tablet Take 1 tablet by mouth 2 (two) times daily.   B Complex-Folic Acid (BENFOTIAMINE MULTI-B) CAPS Take 1 capsule by mouth daily.   Blood Glucose Monitoring Suppl (ACCU-CHEK GUIDE ME) w/Device KIT See admin instructions.   BREO ELLIPTA  100-25 MCG/ACT AEPB INHALE 1 PUFF INTO THE LUNGS DAILY   cetirizine  (ZYRTEC  ALLERGY) 10 MG tablet Take 1 tablet (10 mg total) by mouth daily.   Cholecalciferol (VITAMIN D  PO) Take 2,000 Units by mouth daily.   clobetasol  cream (TEMOVATE ) 0.05 % Apply 1  Application topically daily as needed.   fluconazole  (DIFLUCAN ) 150 MG tablet Take 1 tab po today, repeat in 2 days if needed   fluticasone  (FLONASE ) 50 MCG/ACT nasal spray Place 2 sprays into both nostrils daily.   gabapentin  (NEURONTIN ) 100 MG capsule TAKE 1 CAPSULE(100 MG) BY MOUTH THREE TIMES DAILY   glipiZIDE  (GLUCOTROL  XL) 10 MG 24 hr tablet TAKE 1 TABLET(10 MG) BY MOUTH DAILY WITH BREAKFAST   glucose blood (ACCU-CHEK GUIDE TEST) test strip USE AS DIRECTED TO CHECK BLOOD SUGAR TWICE DAILY   ipratropium-albuterol  (DUONEB) 0.5-2.5 (3) MG/3ML SOLN USE 3 ML VIA NEBULIZER EVERY 6 HOURS AS NEEDED   ketoconazole  (NIZORAL ) 2 % cream Apply 1 Application topically daily.   levothyroxine  (SYNTHROID ) 75 MCG tablet TAKE 1 TABLET(75 MCG) BY MOUTH DAILY BEFORE BREAKFAST   meloxicam  (MOBIC ) 15 MG tablet Take 1 tablet (15 mg total) by mouth daily.   metFORMIN  (GLUCOPHAGE ) 500 MG tablet TAKE 1 TABLET(500 MG) BY MOUTH TWICE DAILY WITH A MEAL   nystatin  cream (MYCOSTATIN ) APPLY TOPICALLY TO THE AFFECTED AREA TWICE DAILY   omeprazole (PRILOSEC) 20 MG capsule Take 20 mg by mouth daily as needed.   predniSONE  (DELTASONE ) 5 MG tablet TAKE 1 TABLET(5 MG)  BY MOUTH DAILY WITH BREAKFAST   Current Facility-Administered Medications for the 10/17/24 encounter (Office Visit) with Tamea Dedra CROME, MD  Medication   [START ON 04/15/2025] denosumab  (PROLIA ) injection 60 mg    Immunization History  Administered Date(s) Administered    sv, Bivalent, Protein Subunit Rsvpref,pf [place]) 08/13/2022   Fluad Quad(high Dose 65+) 09/03/2020, 08/02/2021, 08/14/2022   Fluad Trivalent(High Dose 65+) 07/11/2023   INFLUENZA, HIGH DOSE SEASONAL PF 08/22/2024   Influenza,inj,Quad PF,6+ Mos 08/20/2015, 07/22/2016, 08/14/2017, 08/23/2018, 08/29/2019   Influenza-Unspecified 08/14/2014   PFIZER Comirnaty(Gray Top)Covid-19 Tri-Sucrose Vaccine 04/15/2021   PFIZER(Purple Top)SARS-COV-2 Vaccination 01/07/2020, 01/28/2020, 10/11/2020    Pneumococcal Conjugate Pcv21, Polysaccharide Crm197 Conjugaf 08/22/2024   Pneumococcal Conjugate-13 08/20/2015   Pneumococcal Polysaccharide-23 08/29/2019   Respiratory Syncytial Virus Vaccine,Recomb Aduvanted(Arexvy) 08/14/2022        Objective:     Vitals:   10/17/24 0907  BP: 134/84  Pulse: 79  Temp: 97.9 F (36.6 C)  Height: 5' 3.5 (1.613 m)  Weight: 178 lb (80.7 kg)  SpO2: 99%  TempSrc: Temporal  BMI (Calculated): 31.03     SpO2: 99 %  GENERAL: Overweight elderly female, quite spry, very well-groomed.  Presents in transport chair today. No conversational dyspnea. No distress. HEAD: Normocephalic, atraumatic. EYES: Pupils equal, round, reactive to light.  No scleral icterus. MOUTH: Lower teeth in poor repair.  Oral mucosa moist. NECK: Supple. No thyromegaly. Trachea midline. No JVD.  No adenopathy. PULMONARY: She has increased AP diameter due to kyphosis.Good air entry bilaterally.  Few rhonchi, otherwise, no adventitious sounds. CARDIOVASCULAR: S1 and S2. Regular rate and rhythm. Soft grade 1/6 systolic ejection murmur left sternal border. No rubs or gallops noted. ABDOMEN: Obese,nondistended. MUSCULOSKELETAL: No lower extremity edema noted.   NEUROLOGIC: No overt focal deficits noted. Speech is fluent. SKIN: Intact,warm,dry. On limited exam no rashes. PSYCH: Mood and behavior normal.   Assessment & Plan:     ICD-10-CM   1. Stage 2 moderate COPD by GOLD classification (HCC)  J44.9     2. Nocturnal hypoxemia due to obstructive chronic bronchitis (HCC)  J44.89    G47.36     3. SOB (shortness of breath)  R06.02     4. Obesity (BMI 30.0-34.9)  Z33.188      Discussion:    Chronic obstructive pulmonary disease (COPD) COPD is well-managed with no wheezing or significant dyspnea. Occasional cough due to nasal drainage. Uses Breo daily and rescue inhaler as needed, primarily for foot pain. - Continue Breo daily. - Use rescue inhaler as needed. - Scheduled  follow-up in four months.      Advised if symptoms do not improve or worsen, to please contact office for sooner follow up or seek emergency care.    I spent 30 minutes of dedicated to the care of this patient on the date of this encounter to include pre-visit review of records, face-to-face time with the patient discussing conditions above, post visit ordering of testing, clinical documentation with the electronic health record, making appropriate referrals as documented, and communicating necessary findings to members of the patients care team.     C. Leita Tamea, MD [facility]    *This note was generated using voice recognition software/Dragon and/or AI transcription program.  Despite best efforts to proofread, errors can occur which can change the meaning. Any transcriptional errors that result from this process are unintentional and may not be fully corrected at the time of dictation.

## 2024-10-17 NOTE — Patient Instructions (Signed)
 VISIT SUMMARY:  Today, we discussed your ongoing management of COPD and addressed your concerns about shortness of breath and foot pain. Your COPD appears to be well-managed with your current medications, and we reviewed your use of Breo and the rescue inhaler. We also talked about your foot pain and the challenges you face with your footwear.  YOUR PLAN:  -CHRONIC OBSTRUCTIVE PULMONARY DISEASE (COPD): COPD is a chronic lung condition that makes it hard to breathe. Your COPD is well-managed with your current medications. Continue using Breo every morning and your rescue inhaler as needed, especially when you experience foot pain. We will follow up in four months to monitor your condition.  INSTRUCTIONS:  Please continue using Breo daily and your rescue inhaler as needed. We will have a follow-up appointment in four months to check on your COPD management.

## 2024-10-21 DIAGNOSIS — H353131 Nonexudative age-related macular degeneration, bilateral, early dry stage: Secondary | ICD-10-CM | POA: Diagnosis not present

## 2024-10-21 DIAGNOSIS — M3501 Sicca syndrome with keratoconjunctivitis: Secondary | ICD-10-CM | POA: Diagnosis not present

## 2024-10-21 DIAGNOSIS — E113211 Type 2 diabetes mellitus with mild nonproliferative diabetic retinopathy with macular edema, right eye: Secondary | ICD-10-CM | POA: Diagnosis not present

## 2024-10-22 ENCOUNTER — Ambulatory Visit: Admitting: Podiatry

## 2024-10-22 ENCOUNTER — Encounter: Payer: Self-pay | Admitting: Podiatry

## 2024-10-22 ENCOUNTER — Ambulatory Visit

## 2024-10-22 VITALS — Ht 63.5 in | Wt 178.0 lb

## 2024-10-22 DIAGNOSIS — M65971 Unspecified synovitis and tenosynovitis, right ankle and foot: Secondary | ICD-10-CM

## 2024-10-22 DIAGNOSIS — M2041 Other hammer toe(s) (acquired), right foot: Secondary | ICD-10-CM

## 2024-10-22 DIAGNOSIS — M65972 Unspecified synovitis and tenosynovitis, left ankle and foot: Secondary | ICD-10-CM

## 2024-10-22 DIAGNOSIS — M2042 Other hammer toe(s) (acquired), left foot: Secondary | ICD-10-CM | POA: Diagnosis not present

## 2024-10-22 MED ORDER — BETAMETHASONE SOD PHOS & ACET 6 (3-3) MG/ML IJ SUSP
3.0000 mg | Freq: Once | INTRAMUSCULAR | Status: AC
  Administered 2024-10-22: 3 mg via INTRA_ARTICULAR

## 2024-10-22 NOTE — Progress Notes (Signed)
 Chief Complaint  Patient presents with   Injections    Pt is here to receive injections into both feet for pain.    HPI: 89 y.o. female PMHx T2DM with peripheral polyneuropathy, DJD bilateral feet, bunion deformity, hammertoes bilateral presenting for significant pain and tenderness associated to the bilateral forefoot.  She wears her diabetic shoes with custom Plastizote insoles and she takes gabapentin  with no relief.  She says that the pain in the forefoot is  affecting her ability to walk  Past Medical History:  Diagnosis Date   Actinic keratosis    Arthritis    Asthma    COPD (chronic obstructive pulmonary disease) (HCC)    Diabetes (HCC)    SCC (squamous cell carcinoma) 10/09/2023   left lower lateral leg, [facility] 11/03/2023   Thyroid  disease     Past Surgical History:  Procedure Laterality Date   ABDOMINAL HYSTERECTOMY  1984   benign, TAH.   APPENDECTOMY  1939    CATARACT EXTRACTION, BILATERAL     CHOLECYSTECTOMY  1987   NASAL SINUS SURGERY  1990   REPLACEMENT TOTAL KNEE Right 2007    Allergies  Allergen Reactions   Myrbetriq  [Mirabegron  Er] Other (See Comments)    GI Problem   Augmentin  [Amoxicillin -Pot Clavulanate] Diarrhea    RT second toe 10/22/2024  Physical Exam: General: The patient is alert and oriented x3 in no acute distress.  Dermatology: Skin is warm, dry and supple bilateral lower extremities.   Vascular: Palpable pedal pulses bilaterally. Capillary refill within normal limits.  No appreciable edema.  No erythema.  Varicosities noted bilateral lower extremities  Neurological: Light touch and protective threshold diminished  Musculoskeletal Exam: Chronic arthritis noted throughout the pedal joints of the foot bilateral with medial longitudinal arch collapse.  Hallux valgus deformity also noted bilateral with overlapping deformity of the hammertoe of the second digit right foot.  Hammertoes also noted to the lesser digits bilateral.  Significant pain  tenderness associated to the lesser MTPs of the bilateral feet.  Pain with palpation and range of motion  Assessment/Plan of Care: 1.  Diabetes mellitus with peripheral polyneuropathy 2.  DJD/arthritis bilateral feet 3.  Metatarsalgia; metatarsophalangeal capsulitis/synovitis bilateral  4.  Hallux valgus deformity with hammertoes of the lesser digits bilateral 5.  Severe hammertoe contracture deformity overlapping the great toe second digit right foot  -Patient evaluated.  X-rays reviewed -Injection of 0.5 cc Celestone  Soluspan injected into the second MTP bilateral -Continue diabetic shoes with custom molded Plastizote insoles -Continue management with PCP for diabetes control - Unfortunately patient continues to have severe pain and tenderness associated to the second toe of the right foot.  It is not overlapping the great toe and has increased in the severity.  It is very tender and painful on a constant basis.  I do believe that amputation of the toe would be appropriate and provide significant relief of her symptoms and pain that she is experiencing on a daily basis. -Risk benefits advantages and disadvantages of the procedure were explained.  No guarantees were expressed or implied.  Postoperative recovery course was also explained in length in detail to the patient.  She would like to proceed with toe amputation at this time -Authorization for surgery was initiated today.  Surgery will consist of right second toe amputation -Return to clinic 1 week postop      Thresa EMERSON Sar, DPM [facility]  Dr. Thresa EMERSON Sar, DPM    [address]  [place], [place] [address]                Office [phone]  Fax [phone]

## 2024-10-31 ENCOUNTER — Other Ambulatory Visit: Payer: Self-pay | Admitting: Family Medicine

## 2024-11-26 ENCOUNTER — Telehealth: Payer: Self-pay | Admitting: Podiatry

## 2024-11-26 NOTE — Telephone Encounter (Signed)
 Called and scheduled patient for surgery on 12/20/2024. Patient not on any GLP1 or blood thinners. Patient pharmacy correct in chart.

## 2024-11-29 ENCOUNTER — Other Ambulatory Visit: Payer: Self-pay | Admitting: Podiatry

## 2024-12-03 ENCOUNTER — Telehealth: Payer: Self-pay | Admitting: Podiatry

## 2024-12-03 NOTE — Telephone Encounter (Signed)
 DOS- 12/20/2024  AMPUTATION TOE MPJ JOINT 2ND RT- 28820  [employer] EFFECTIVE DATE- 11/14/1988  DEDUCTIBLE- $350 REMAINING- $350 OOP- $6000 REMAINING- $5975.21 COINSURANCE- 0%  PER [employer] PORTAL, PRIOR AUTH IS NOT REQUIRED FOR CPT CODE 71179.

## 2024-12-05 ENCOUNTER — Ambulatory Visit: Admitting: Family Medicine

## 2024-12-05 ENCOUNTER — Encounter: Payer: Self-pay | Admitting: Family Medicine

## 2024-12-05 VITALS — BP 130/82 | HR 80 | Temp 97.6°F | Ht 63.5 in | Wt 178.0 lb

## 2024-12-05 DIAGNOSIS — T380X5D Adverse effect of glucocorticoids and synthetic analogues, subsequent encounter: Secondary | ICD-10-CM | POA: Diagnosis not present

## 2024-12-05 DIAGNOSIS — E099 Drug or chemical induced diabetes mellitus without complications: Secondary | ICD-10-CM | POA: Diagnosis not present

## 2024-12-05 DIAGNOSIS — J441 Chronic obstructive pulmonary disease with (acute) exacerbation: Secondary | ICD-10-CM | POA: Diagnosis not present

## 2024-12-05 DIAGNOSIS — B370 Candidal stomatitis: Secondary | ICD-10-CM | POA: Diagnosis not present

## 2024-12-05 DIAGNOSIS — Z01818 Encounter for other preprocedural examination: Secondary | ICD-10-CM | POA: Diagnosis not present

## 2024-12-05 LAB — POCT GLYCOSYLATED HEMOGLOBIN (HGB A1C): Hemoglobin A1C: 7.1 % — AB (ref 4.0–5.6)

## 2024-12-05 MED ORDER — PREDNISONE 20 MG PO TABS: 20.0000 mg | ORAL_TABLET | Freq: Every day | ORAL | 0 refills | Status: AC

## 2024-12-05 MED ORDER — NYSTATIN 100000 UNIT/ML MT SUSP: 5.0000 mL | Freq: Four times a day (QID) | OROMUCOSAL | 1 refills | Status: AC

## 2024-12-05 NOTE — Assessment & Plan Note (Signed)
 Chronic, tolerable control , continue current medication regimen  metformin 500 mg BID and glipizide xl 10 mg daily  Associated with neuropathy.

## 2024-12-05 NOTE — Assessment & Plan Note (Addendum)
 Patient moderate risk given history of diabetes and severe COPD but upcoming surgery minimally invasive.  Patient will tolerate IV sedation and local anesthesia well. Diabetes control is good with A1c in office today of 7.1. Her activity level is limited by shortness of breath from COPD and foot issue but is able to do about 2-3 METS of activity without issues.

## 2024-12-05 NOTE — H&P (View-Only) (Signed)
 "   Patient ID: Megan Bradshaw, female    DOB: 02/13/1930, 89 y.o.   MRN: [id number]  This visit was conducted in person.  BP 130/82 (Cuff Size: Large)   Pulse 80   Temp 97.6 F (36.4 C) (Oral)   Ht 5' 3.5 (1.613 m)   Wt 178 lb (80.7 kg)   SpO2 97%   BMI 31.04 kg/m    CC:  Chief Complaint  Patient presents with   Medical Management of Chronic Issues    Surgical Clearance  Pt states her wheezing has been worse in the last week, states she has been congested and is requesting prednisone   Pt had scrap on right leg 3 weeks ago and would like Dr. Avelina to check if it is healing well     Subjective:   HPI: Megan Bradshaw is a 89 y.o. female presenting on 12/05/2024 for Medical Management of Chronic Issues (Surgical Clearance//Pt states her wheezing has been worse in the last week, states she has been congested and is requesting prednisone //Pt had scrap on right leg 3 weeks ago and would like Dr. Avelina to check if it is healing well )   Patient with upcoming surgery for toe amputation with Dr. Janit on December 20, 2024.  IV sedation with local anesthesia planned.  Due for reevaluation of A1c with history of steroid-induced diabetes. Lab Results  Component Value Date   HGBA1C 7.1 (A) 12/05/2024    She has a history of severe COPD on chronic low-dose prednisone . She states that in the last week she had noted some increasing congestion and increase in wheeze. She uses albuterol  or albuterol  nebs as needed.  She takes daily Breo Ellipta  inhaler. She is on Flonase  2 sprays per nostril daily.  No ear pain, no face pain, no ST. No fever.     She denies chest pain.  She is able to walk moderate distance before having to stop.SABRA if pain in right foot discounted. She cannot walk up stairs  without SOB.    She cut right lower leg 3-4 weeks ago.. scab fell off today.  No pain.   Relevant past medical, surgical, family and social history reviewed and updated as indicated.  Interim medical history since our last visit reviewed. Allergies and medications reviewed and updated. Outpatient Medications Prior to Visit  Medication Sig Dispense Refill   Accu-Chek Softclix Lancets lancets USE TO CHECK BLOOD SUGAR ONCE A DAY 100 each 3   albuterol  (PROVENTIL ) (2.5 MG/3ML) 0.083% nebulizer solution Take 3 mLs (2.5 mg total) by nebulization every 4 (four) hours as needed for wheezing or shortness of breath. 150 mL 5   albuterol  (VENTOLIN  HFA) 108 (90 Base) MCG/ACT inhaler INHALE 1 TO 2 PUFFS BY MOUTH EVERY 6 HOURS FOR PAIN OR WHEEZING OR SHORTNESS OF BREATH 18 g 2   Ascorbic Acid (VITAMIN C) 1000 MG tablet Take 1,000 mg by mouth daily.     Blood Glucose Monitoring Suppl (ACCU-CHEK GUIDE ME) w/Device KIT See admin instructions.     BREO ELLIPTA  100-25 MCG/ACT AEPB INHALE 1 PUFF INTO THE LUNGS DAILY 180 each 3   Calcium-Magnesium-Vitamin D  (CALCIUM 1200+D3 PO) Take 1 tablet by mouth daily.     clobetasol  cream (TEMOVATE ) 0.05 % Apply 1 Application topically daily as needed. 30 g 1   denosumab  (PROLIA ) 60 MG/ML SOSY injection Inject 60 mg into the skin every 6 (six) months.     fluticasone  (FLONASE ) 50 MCG/ACT nasal spray Place 2 sprays into both nostrils daily. (  Patient taking differently: Place 2 sprays into both nostrils daily as needed for allergies.) 16 g 2   gabapentin  (NEURONTIN ) 100 MG capsule TAKE 1 CAPSULE(100 MG) BY MOUTH THREE TIMES DAILY 180 capsule 3   glipiZIDE  (GLUCOTROL  XL) 10 MG 24 hr tablet TAKE 1 TABLET(10 MG) BY MOUTH DAILY WITH BREAKFAST 90 tablet 1   glucose blood (ACCU-CHEK GUIDE TEST) test strip USE AS DIRECTED TO CHECK BLOOD SUGAR TWICE DAILY 200 strip 3   ketoconazole  (NIZORAL ) 2 % cream Apply 1 Application topically daily. (Patient taking differently: Apply 1 Application topically daily as needed for irritation.) 15 g 0   levothyroxine  (SYNTHROID ) 75 MCG tablet TAKE 1 TABLET(75 MCG) BY MOUTH DAILY BEFORE BREAKFAST 90 tablet 3   meloxicam  (MOBIC ) 15 MG  tablet TAKE 1 TABLET(15 MG) BY MOUTH DAILY 60 tablet 1   metFORMIN  (GLUCOPHAGE ) 500 MG tablet TAKE 1 TABLET(500 MG) BY MOUTH TWICE DAILY WITH A MEAL 180 tablet 3   nystatin  cream (MYCOSTATIN ) APPLY TOPICALLY TO THE AFFECTED AREA TWICE DAILY (Patient taking differently: Apply 1 Application topically 2 (two) times daily as needed (skin irritation (yeast)).) 30 g 5   omeprazole (PRILOSEC) 20 MG capsule Take 20 mg by mouth daily as needed.     Polyethyl Glycol-Propyl Glycol 0.4-0.3 % SOLN Place 1-2 drops into both eyes 3 (three) times daily as needed (dry/irritated eyes.).     predniSONE  (DELTASONE ) 5 MG tablet TAKE 1 TABLET(5 MG) BY MOUTH DAILY WITH BREAKFAST 90 tablet 1   sodium chloride (OCEAN) 0.65 % SOLN nasal spray Place 1 spray into both nostrils as needed for congestion.     SUPER B COMPLEX/C PO Take 1 capsule by mouth daily.     Facility-Administered Medications Prior to Visit  Medication Dose Route Frequency Provider Last Rate Last Admin   [START ON 04/15/2025] denosumab  (PROLIA ) injection 60 mg  60 mg Subcutaneous Once Jeryn Bertoni E, MD         Per HPI unless specifically indicated in ROS section below Review of Systems  Constitutional:  Negative for fatigue and fever.  HENT:  Negative for ear pain.   Eyes:  Negative for pain.  Respiratory:  Positive for shortness of breath and wheezing. Negative for chest tightness.   Cardiovascular:  Negative for chest pain, palpitations and leg swelling.  Gastrointestinal:  Negative for abdominal pain.  Genitourinary:  Negative for dysuria.   Objective:  BP 130/82 (Cuff Size: Large)   Pulse 80   Temp 97.6 F (36.4 C) (Oral)   Ht 5' 3.5 (1.613 m)   Wt 178 lb (80.7 kg)   SpO2 97%   BMI 31.04 kg/m   Wt Readings from Last 3 Encounters:  12/05/24 178 lb (80.7 kg)  10/22/24 178 lb (80.7 kg)  10/17/24 178 lb (80.7 kg)      Physical Exam Constitutional:      General: She is not in acute distress.    Appearance: Normal appearance. She is  well-developed. She is not ill-appearing or toxic-appearing.  HENT:     Head: Normocephalic.     Right Ear: Hearing, tympanic membrane, ear canal and external ear normal. Tympanic membrane is not erythematous, retracted or bulging.     Left Ear: Hearing, tympanic membrane, ear canal and external ear normal. Tympanic membrane is not erythematous, retracted or bulging.     Nose: No mucosal edema or rhinorrhea.     Right Sinus: No maxillary sinus tenderness or frontal sinus tenderness.     Left Sinus: No  maxillary sinus tenderness or frontal sinus tenderness.     Mouth/Throat:     Pharynx: Uvula midline.     Comments: White plaque in posterior oropharynx and posterior tongue Eyes:     General: Lids are normal. Lids are everted, no foreign bodies appreciated.     Conjunctiva/sclera: Conjunctivae normal.     Pupils: Pupils are equal, round, and reactive to light.  Neck:     Thyroid : No thyroid  mass or thyromegaly.     Vascular: No carotid bruit.     Trachea: Trachea normal.  Cardiovascular:     Rate and Rhythm: Normal rate and regular rhythm.     Pulses: Normal pulses.     Heart sounds: Normal heart sounds, S1 normal and S2 normal. No murmur heard.    No friction rub. No gallop.     Comments: Well-healing scar on right lower leg Pulmonary:     Effort: Pulmonary effort is normal. No tachypnea or respiratory distress.     Breath sounds: Decreased air movement present. Decreased breath sounds present. No wheezing, rhonchi or rales.  Abdominal:     General: Bowel sounds are normal.     Palpations: Abdomen is soft.     Tenderness: There is no abdominal tenderness.  Musculoskeletal:     Cervical back: Normal range of motion and neck supple.     Right lower leg: 1+ Edema present.  Skin:    General: Skin is warm and dry.     Findings: No rash.  Neurological:     Mental Status: She is alert.  Psychiatric:        Mood and Affect: Mood is not anxious or depressed.        Speech: Speech  normal.        Behavior: Behavior normal. Behavior is cooperative.        Thought Content: Thought content normal.        Judgment: Judgment normal.       Results for orders placed or performed in visit on 12/05/24  HgB A1c   Collection Time: 12/05/24  4:00 PM  Result Value Ref Range   Hemoglobin A1C 7.1 (A) 4.0 - 5.6 %   HbA1c POC (<> result, manual entry)     HbA1c, POC (prediabetic range)     HbA1c, POC (controlled diabetic range)      Assessment and Plan  Pre-op examination Assessment & Plan: Patient moderate risk given history of diabetes and severe COPD but upcoming surgery minimally invasive.  Patient will tolerate IV sedation and local anesthesia well. Diabetes control is good with A1c in office today of 7.1. Her activity level is limited by shortness of breath from COPD and foot issue but is able to do about 2-3 METS of activity without issues.     COPD with exacerbation (HCC) Assessment & Plan: Chronic with acute mild exacerbation.  No sign of viral or bacterial infection. Will temporarily taper up prednisone  for the next week and then return to 5 mg daily dose. Patient will follow-up in office prior to procedure if she is not improving as expected.  Continue albuterol  as needed and Breo daily controller.  Orders: -     predniSONE ; Take 1 tablet (20 mg total) by mouth daily with breakfast.  Dispense: 15 tablet; Refill: 0  Steroid-induced diabetes mellitus, subsequent encounter Assessment & Plan: Chronic, tolerable control , continue current medication regimen  metformin  500 mg BID and glipizide  xl 10 mg daily  Associated with neuropathy.  Orders: -     POCT glycosylated hemoglobin (Hb A1C)  Oral candida Assessment & Plan: Acute, patient asymptomatic but this is noted in her posterior oropharynx.  To avoid it worsening and causing symptoms I will provide nystatin  swish and gargle for to use 4 times a day over the next week especially since she will be on  higher dose of prednisone .   Other orders -     Nystatin ; Take 5 mLs (500,000 Units total) by mouth 4 (four) times daily.  Dispense: 60 mL; Refill: 1    No follow-ups on file.   Greig Ring, MD  "

## 2024-12-05 NOTE — Assessment & Plan Note (Signed)
 Acute, patient asymptomatic but this is noted in her posterior oropharynx.  To avoid it worsening and causing symptoms I will provide nystatin  swish and gargle for to use 4 times a day over the next week especially since she will be on higher dose of prednisone .

## 2024-12-05 NOTE — Assessment & Plan Note (Signed)
 Chronic with acute mild exacerbation.  No sign of viral or bacterial infection. Will temporarily taper up prednisone  for the next week and then return to 5 mg daily dose. Patient will follow-up in office prior to procedure if she is not improving as expected.  Continue albuterol  as needed and Breo daily controller.

## 2024-12-05 NOTE — Progress Notes (Signed)
 "   Patient ID: Megan Bradshaw, female    DOB: 02/13/1930, 89 y.o.   MRN: [id number]  This visit was conducted in person.  BP 130/82 (Cuff Size: Large)   Pulse 80   Temp 97.6 F (36.4 C) (Oral)   Ht 5' 3.5 (1.613 m)   Wt 178 lb (80.7 kg)   SpO2 97%   BMI 31.04 kg/m    CC:  Chief Complaint  Patient presents with   Medical Management of Chronic Issues    Surgical Clearance  Pt states her wheezing has been worse in the last week, states she has been congested and is requesting prednisone   Pt had scrap on right leg 3 weeks ago and would like Dr. Avelina to check if it is healing well     Subjective:   HPI: Megan Bradshaw is a 89 y.o. female presenting on 12/05/2024 for Medical Management of Chronic Issues (Surgical Clearance//Pt states her wheezing has been worse in the last week, states she has been congested and is requesting prednisone //Pt had scrap on right leg 3 weeks ago and would like Dr. Avelina to check if it is healing well )   Patient with upcoming surgery for toe amputation with Dr. Janit on December 20, 2024.  IV sedation with local anesthesia planned.  Due for reevaluation of A1c with history of steroid-induced diabetes. Lab Results  Component Value Date   HGBA1C 7.1 (A) 12/05/2024    She has a history of severe COPD on chronic low-dose prednisone . She states that in the last week she had noted some increasing congestion and increase in wheeze. She uses albuterol  or albuterol  nebs as needed.  She takes daily Breo Ellipta  inhaler. She is on Flonase  2 sprays per nostril daily.  No ear pain, no face pain, no ST. No fever.     She denies chest pain.  She is able to walk moderate distance before having to stop.SABRA if pain in right foot discounted. She cannot walk up stairs  without SOB.    She cut right lower leg 3-4 weeks ago.. scab fell off today.  No pain.   Relevant past medical, surgical, family and social history reviewed and updated as indicated.  Interim medical history since our last visit reviewed. Allergies and medications reviewed and updated. Outpatient Medications Prior to Visit  Medication Sig Dispense Refill   Accu-Chek Softclix Lancets lancets USE TO CHECK BLOOD SUGAR ONCE A DAY 100 each 3   albuterol  (PROVENTIL ) (2.5 MG/3ML) 0.083% nebulizer solution Take 3 mLs (2.5 mg total) by nebulization every 4 (four) hours as needed for wheezing or shortness of breath. 150 mL 5   albuterol  (VENTOLIN  HFA) 108 (90 Base) MCG/ACT inhaler INHALE 1 TO 2 PUFFS BY MOUTH EVERY 6 HOURS FOR PAIN OR WHEEZING OR SHORTNESS OF BREATH 18 g 2   Ascorbic Acid (VITAMIN C) 1000 MG tablet Take 1,000 mg by mouth daily.     Blood Glucose Monitoring Suppl (ACCU-CHEK GUIDE ME) w/Device KIT See admin instructions.     BREO ELLIPTA  100-25 MCG/ACT AEPB INHALE 1 PUFF INTO THE LUNGS DAILY 180 each 3   Calcium-Magnesium-Vitamin D  (CALCIUM 1200+D3 PO) Take 1 tablet by mouth daily.     clobetasol  cream (TEMOVATE ) 0.05 % Apply 1 Application topically daily as needed. 30 g 1   denosumab  (PROLIA ) 60 MG/ML SOSY injection Inject 60 mg into the skin every 6 (six) months.     fluticasone  (FLONASE ) 50 MCG/ACT nasal spray Place 2 sprays into both nostrils daily. (  Patient taking differently: Place 2 sprays into both nostrils daily as needed for allergies.) 16 g 2   gabapentin  (NEURONTIN ) 100 MG capsule TAKE 1 CAPSULE(100 MG) BY MOUTH THREE TIMES DAILY 180 capsule 3   glipiZIDE  (GLUCOTROL  XL) 10 MG 24 hr tablet TAKE 1 TABLET(10 MG) BY MOUTH DAILY WITH BREAKFAST 90 tablet 1   glucose blood (ACCU-CHEK GUIDE TEST) test strip USE AS DIRECTED TO CHECK BLOOD SUGAR TWICE DAILY 200 strip 3   ketoconazole  (NIZORAL ) 2 % cream Apply 1 Application topically daily. (Patient taking differently: Apply 1 Application topically daily as needed for irritation.) 15 g 0   levothyroxine  (SYNTHROID ) 75 MCG tablet TAKE 1 TABLET(75 MCG) BY MOUTH DAILY BEFORE BREAKFAST 90 tablet 3   meloxicam  (MOBIC ) 15 MG  tablet TAKE 1 TABLET(15 MG) BY MOUTH DAILY 60 tablet 1   metFORMIN  (GLUCOPHAGE ) 500 MG tablet TAKE 1 TABLET(500 MG) BY MOUTH TWICE DAILY WITH A MEAL 180 tablet 3   nystatin  cream (MYCOSTATIN ) APPLY TOPICALLY TO THE AFFECTED AREA TWICE DAILY (Patient taking differently: Apply 1 Application topically 2 (two) times daily as needed (skin irritation (yeast)).) 30 g 5   omeprazole (PRILOSEC) 20 MG capsule Take 20 mg by mouth daily as needed.     Polyethyl Glycol-Propyl Glycol 0.4-0.3 % SOLN Place 1-2 drops into both eyes 3 (three) times daily as needed (dry/irritated eyes.).     predniSONE  (DELTASONE ) 5 MG tablet TAKE 1 TABLET(5 MG) BY MOUTH DAILY WITH BREAKFAST 90 tablet 1   sodium chloride (OCEAN) 0.65 % SOLN nasal spray Place 1 spray into both nostrils as needed for congestion.     SUPER B COMPLEX/C PO Take 1 capsule by mouth daily.     Facility-Administered Medications Prior to Visit  Medication Dose Route Frequency Provider Last Rate Last Admin   [START ON 04/15/2025] denosumab  (PROLIA ) injection 60 mg  60 mg Subcutaneous Once Jeryn Bertoni E, MD         Per HPI unless specifically indicated in ROS section below Review of Systems  Constitutional:  Negative for fatigue and fever.  HENT:  Negative for ear pain.   Eyes:  Negative for pain.  Respiratory:  Positive for shortness of breath and wheezing. Negative for chest tightness.   Cardiovascular:  Negative for chest pain, palpitations and leg swelling.  Gastrointestinal:  Negative for abdominal pain.  Genitourinary:  Negative for dysuria.   Objective:  BP 130/82 (Cuff Size: Large)   Pulse 80   Temp 97.6 F (36.4 C) (Oral)   Ht 5' 3.5 (1.613 m)   Wt 178 lb (80.7 kg)   SpO2 97%   BMI 31.04 kg/m   Wt Readings from Last 3 Encounters:  12/05/24 178 lb (80.7 kg)  10/22/24 178 lb (80.7 kg)  10/17/24 178 lb (80.7 kg)      Physical Exam Constitutional:      General: She is not in acute distress.    Appearance: Normal appearance. She is  well-developed. She is not ill-appearing or toxic-appearing.  HENT:     Head: Normocephalic.     Right Ear: Hearing, tympanic membrane, ear canal and external ear normal. Tympanic membrane is not erythematous, retracted or bulging.     Left Ear: Hearing, tympanic membrane, ear canal and external ear normal. Tympanic membrane is not erythematous, retracted or bulging.     Nose: No mucosal edema or rhinorrhea.     Right Sinus: No maxillary sinus tenderness or frontal sinus tenderness.     Left Sinus: No  maxillary sinus tenderness or frontal sinus tenderness.     Mouth/Throat:     Pharynx: Uvula midline.     Comments: White plaque in posterior oropharynx and posterior tongue Eyes:     General: Lids are normal. Lids are everted, no foreign bodies appreciated.     Conjunctiva/sclera: Conjunctivae normal.     Pupils: Pupils are equal, round, and reactive to light.  Neck:     Thyroid : No thyroid  mass or thyromegaly.     Vascular: No carotid bruit.     Trachea: Trachea normal.  Cardiovascular:     Rate and Rhythm: Normal rate and regular rhythm.     Pulses: Normal pulses.     Heart sounds: Normal heart sounds, S1 normal and S2 normal. No murmur heard.    No friction rub. No gallop.     Comments: Well-healing scar on right lower leg Pulmonary:     Effort: Pulmonary effort is normal. No tachypnea or respiratory distress.     Breath sounds: Decreased air movement present. Decreased breath sounds present. No wheezing, rhonchi or rales.  Abdominal:     General: Bowel sounds are normal.     Palpations: Abdomen is soft.     Tenderness: There is no abdominal tenderness.  Musculoskeletal:     Cervical back: Normal range of motion and neck supple.     Right lower leg: 1+ Edema present.  Skin:    General: Skin is warm and dry.     Findings: No rash.  Neurological:     Mental Status: She is alert.  Psychiatric:        Mood and Affect: Mood is not anxious or depressed.        Speech: Speech  normal.        Behavior: Behavior normal. Behavior is cooperative.        Thought Content: Thought content normal.        Judgment: Judgment normal.       Results for orders placed or performed in visit on 12/05/24  HgB A1c   Collection Time: 12/05/24  4:00 PM  Result Value Ref Range   Hemoglobin A1C 7.1 (A) 4.0 - 5.6 %   HbA1c POC (<> result, manual entry)     HbA1c, POC (prediabetic range)     HbA1c, POC (controlled diabetic range)      Assessment and Plan  Pre-op examination Assessment & Plan: Patient moderate risk given history of diabetes and severe COPD but upcoming surgery minimally invasive.  Patient will tolerate IV sedation and local anesthesia well. Diabetes control is good with A1c in office today of 7.1. Her activity level is limited by shortness of breath from COPD and foot issue but is able to do about 2-3 METS of activity without issues.     COPD with exacerbation (HCC) Assessment & Plan: Chronic with acute mild exacerbation.  No sign of viral or bacterial infection. Will temporarily taper up prednisone  for the next week and then return to 5 mg daily dose. Patient will follow-up in office prior to procedure if she is not improving as expected.  Continue albuterol  as needed and Breo daily controller.  Orders: -     predniSONE ; Take 1 tablet (20 mg total) by mouth daily with breakfast.  Dispense: 15 tablet; Refill: 0  Steroid-induced diabetes mellitus, subsequent encounter Assessment & Plan: Chronic, tolerable control , continue current medication regimen  metformin  500 mg BID and glipizide  xl 10 mg daily  Associated with neuropathy.  Orders: -     POCT glycosylated hemoglobin (Hb A1C)  Oral candida Assessment & Plan: Acute, patient asymptomatic but this is noted in her posterior oropharynx.  To avoid it worsening and causing symptoms I will provide nystatin  swish and gargle for to use 4 times a day over the next week especially since she will be on  higher dose of prednisone .   Other orders -     Nystatin ; Take 5 mLs (500,000 Units total) by mouth 4 (four) times daily.  Dispense: 60 mL; Refill: 1    No follow-ups on file.   Greig Ring, MD  "

## 2024-12-12 ENCOUNTER — Telehealth: Payer: Self-pay | Admitting: Family Medicine

## 2024-12-12 NOTE — Telephone Encounter (Signed)
 Copied from CRM [id number]. Topic: Clinical - Refused Triage >> Dec 12, 2024  2:32 PM Robinson H wrote: Patient/caller voiced complaints of Patient ran into haird[employer] building in car, states knees hit dashboard, knees hurt not bad but wants to get checked. Declined transfer to triage.  If patient is unestablished, route message to [facility] Nurse Triage If patient is established, route message to the appropriate department clinical pool

## 2024-12-12 NOTE — Telephone Encounter (Signed)
 E2c2 called to inform provider that pt refused triage she don't want to talk to a nurse unless they in the office I spoke to pt and pt stated that her knees hurts and want to get seen due to pt about to have surge to get toe removed I schedule pt for tomorrow

## 2024-12-12 NOTE — Telephone Encounter (Signed)
 Next Appt With Family Medicine Darra Ring, MD) 12/13/2024 at 2:40 PM

## 2024-12-12 NOTE — Telephone Encounter (Signed)
 Noted.

## 2024-12-13 ENCOUNTER — Encounter: Payer: Self-pay | Admitting: Family Medicine

## 2024-12-13 ENCOUNTER — Encounter
Admission: RE | Admit: 2024-12-13 | Discharge: 2024-12-13 | Disposition: A | Source: Ambulatory Visit | Attending: Podiatry

## 2024-12-13 ENCOUNTER — Ambulatory Visit: Admitting: Family Medicine

## 2024-12-13 DIAGNOSIS — Z01812 Encounter for preprocedural laboratory examination: Secondary | ICD-10-CM

## 2024-12-13 DIAGNOSIS — S8001XA Contusion of right knee, initial encounter: Secondary | ICD-10-CM | POA: Diagnosis not present

## 2024-12-13 DIAGNOSIS — E099 Drug or chemical induced diabetes mellitus without complications: Secondary | ICD-10-CM

## 2024-12-13 DIAGNOSIS — J449 Chronic obstructive pulmonary disease, unspecified: Secondary | ICD-10-CM

## 2024-12-13 HISTORY — DX: Other hammer toe(s) (acquired), right foot: M20.41

## 2024-12-13 NOTE — Telephone Encounter (Signed)
 Patient called stating someone from our office was supposed to call her between 61 & 1 today to go over surgery instructions. Per patients chart she was to have pre admissions testing done with [facility]. Provided patient the information to reach them.
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
## 2024-12-13 NOTE — Assessment & Plan Note (Signed)
 Acute bilateral knee pain, mild, contusion right knee. Recommended ice, elevation and can use Tylenol  as needed for pain.  No indication for additional imaging.

## 2024-12-13 NOTE — Patient Instructions (Addendum)
 Your procedure is scheduled on: 12/20/2024  Report to the Registration Desk on the 1st floor of the [facility]. To find out your arrival time, please call [phone] between 1PM - 3PM on: 12/19/2024  If your arrival time is 6:00 am, do not arrive before that time as the [facility] entrance doors do not open until 6:00 am.  REMEMBER: Instructions that are not followed completely may result in serious medical risk, up to and including death; or upon the discretion of your surgeon and anesthesiologist your surgery may need to be rescheduled.  Do not eat food after midnight the night before surgery.  No gum chewing or hard candies.  You may however, drink CLEAR liquids up to 2 hours before you are scheduled to arrive for your surgery. Do not drink anything within 2 hours of your scheduled arrival time.  Clear liquids include: - water    One week prior to surgery: Stop Anti-inflammatories (NSAIDS) such as Advil , Aleve, Ibuprofen , Motrin , Naproxen, Naprosyn and Aspirin based products such as Excedrin, Goody's Powder, BC Powder and  mobic  (meloxicam )  Stop ANY OVER THE COUNTER supplements until after surgery like B complex and vitamin C  You may however, continue to take Tylenol  if needed for pain up until the day of surgery.   Do not take metformin  two days before surgery. Last dose would be Feb 01/2025  Continue taking all of your other prescription medications up until the day of surgery.  ON THE DAY OF SURGERY ONLY TAKE THESE MEDICATIONS WITH SIPS OF WATER:  omeprazole (PRILOSEC)  predniSONE  (DELTASONE ) levothyroxine  (SYNTHROID )  Use flonase  as prescribed or as usual.  Use ocean nose spray as usual  Use inhalers Breo at home on the day of surgery and bring albuterol  inhaler to the hospital.  No Alcohol for 24 hours before or after surgery.  No Smoking including e-cigarettes for 24 hours before surgery.  No chewable tobacco products for at least 6 hours before surgery.  No  nicotine patches on the day of surgery.  Do not use any recreational drugs for at least a week (preferably 2 weeks) before your surgery.  Please be advised that the combination of cocaine and anesthesia may have negative outcomes, up to and including death. If you test positive for cocaine, your surgery will be cancelled.  On the morning of surgery brush your teeth with toothpaste and water, you may rinse your mouth with mouthwash if you wish. Do not swallow any toothpaste or mouthwash.  Use CHG Soap or wipes as directed on instruction sheet.-provided for you   Do not wear jewelry, make-up, hairpins, clips or nail polish.  Do not wear lotions, powders, or perfumes.   Do not shave body hair from the neck down 48 hours before surgery.  Contact lenses, hearing aids and dentures may not be worn into surgery.  Do not bring valuables to the hospital. [facility] is not responsible for any missing/lost belongings or valuables.    Notify your doctor if there is any change in your medical condition (cold, fever, infection).  Wear comfortable clothing (specific to your surgery type) to the hospital.  After surgery, you can help prevent lung complications by doing breathing exercises.  Take deep breaths and cough every 1-2 hours. Your doctor may order a device called an Incentive Spirometer to help you take deep breaths.  If you are being admitted to the hospital overnight, leave your suitcase in the car. After surgery it may be brought to your  room.  In case of increased patient census, it may be necessary for you, the patient, to continue your postoperative care in the Same Day Surgery department.  If you are being discharged the day of surgery, you will not be allowed to drive home. You will need a responsible individual to drive you home and stay with you for 24 hours after surgery.   If you are taking public transportation, you will need to have a responsible individual with  you.  Please call the Pre-admissions Testing Dept. at [phone] if you have any questions about these instructions.  Surgery Visitation Policy:  Patients having surgery or a procedure may have two visitors.  Children under the age of 8 must have an adult with them who is not the patient.  Inpatient Visitation:    Visiting hours are 7 a.m. to 8 p.m. Up to four visitors are allowed at one time in a patient room. The visitors may rotate out with other people during the day.  One visitor age 32 or older may stay with the patient overnight and must be in the room by 8 p.m.   Merchandiser, Retail to address health-related social needs:  [email or web address]                                                                                                              Preparing for Surgery with CHLORHEXIDINE  GLUCONATE (CHG) Soap  Chlorhexidine  Gluconate (CHG) Soap  o An antiseptic cleaner that kills germs and bonds with the skin to continue killing germs even after washing  o Used for showering the night before surgery and morning of surgery  Before surgery, you can play an important role by reducing the number of germs on your skin.  CHG (Chlorhexidine  gluconate) soap is an antiseptic cleanser which kills germs and bonds with the skin to continue killing germs even after washing.  Please do not use if you have an allergy to CHG or antibacterial soaps. If your skin becomes reddened/irritated stop using the CHG.  1. Shower the NIGHT BEFORE SURGERY with CHG soap.  2. If you choose to wash your hair, wash your hair first as usual with your normal shampoo.  3. After shampooing, rinse your hair and body thoroughly to remove the shampoo.  4. Use CHG as you would any other liquid soap. You can apply CHG directly to the skin and wash gently with a clean washcloth.  5. Apply the CHG soap to your body only from the neck down. Do not use on open wounds or open sores.  Avoid contact with your eyes, ears, mouth, and genitals (private parts). Wash face and genitals (private parts) with your normal soap.  6. Wash thoroughly, paying special attention to the area where your surgery will be performed.  7. Thoroughly rinse your body with warm water.  8. Do not shower/wash with your normal soap after using and rinsing off the CHG soap.  9. Do not use lotions, oils, etc., after showering with CHG.  10. Pat yourself dry  with a clean towel.  11. Wear clean pajamas to bed the night before surgery.  12. Place clean sheets on your bed the night of your shower and do not sleep with pets.  13. Do not apply any deodorants/lotions/powders.  14. Please wear clean clothes to the hospital.  15. Remember to brush your teeth with your regular toothpaste.
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
## 2024-12-18 ENCOUNTER — Inpatient Hospital Stay: Admission: RE | Admit: 2024-12-18 | Discharge: 2024-12-18 | Attending: Podiatry

## 2024-12-18 ENCOUNTER — Telehealth: Payer: Self-pay

## 2024-12-18 DIAGNOSIS — E099 Drug or chemical induced diabetes mellitus without complications: Secondary | ICD-10-CM | POA: Insufficient documentation

## 2024-12-18 DIAGNOSIS — Z01812 Encounter for preprocedural laboratory examination: Secondary | ICD-10-CM

## 2024-12-18 DIAGNOSIS — Z01818 Encounter for other preprocedural examination: Secondary | ICD-10-CM | POA: Insufficient documentation

## 2024-12-18 DIAGNOSIS — T380X5D Adverse effect of glucocorticoids and synthetic analogues, subsequent encounter: Secondary | ICD-10-CM | POA: Insufficient documentation

## 2024-12-18 DIAGNOSIS — J449 Chronic obstructive pulmonary disease, unspecified: Secondary | ICD-10-CM | POA: Insufficient documentation

## 2024-12-18 LAB — CBC
HCT: 39.9 % (ref 36.0–46.0)
Hemoglobin: 13 g/dL (ref 12.0–15.0)
MCH: 30.1 pg (ref 26.0–34.0)
MCHC: 32.6 g/dL (ref 30.0–36.0)
MCV: 92.4 fL (ref 80.0–100.0)
Platelets: 343 10*3/uL (ref 150–400)
RBC: 4.32 MIL/uL (ref 3.87–5.11)
RDW: 13.8 % (ref 11.5–15.5)
WBC: 11.3 10*3/uL — ABNORMAL HIGH (ref 4.0–10.5)
nRBC: 0 % (ref 0.0–0.2)

## 2024-12-18 LAB — BASIC METABOLIC PANEL WITH GFR
Anion gap: 13 (ref 5–15)
BUN: 22 mg/dL (ref 8–23)
CO2: 26 mmol/L (ref 22–32)
Calcium: 9 mg/dL (ref 8.9–10.3)
Chloride: 98 mmol/L (ref 98–111)
Creatinine, Ser: 0.78 mg/dL (ref 0.44–1.00)
GFR, Estimated: >60 mL/min
Glucose, Bld: 259 mg/dL — ABNORMAL HIGH (ref 70–99)
Potassium: 4.6 mmol/L (ref 3.5–5.1)
Sodium: 136 mmol/L (ref 135–145)

## 2024-12-18 NOTE — Telephone Encounter (Signed)
 Copied from CRM #[id number]. Topic: Clinical - Medication Question >> Dec 18, 2024 12:57 PM Alfonso ORN wrote: Reason for CRM: pt would like to know how to keep sugar down for the 2 days prior to surgery while she isn't on metformin  as advised. >> Dec 18, 2024 12:59 PM Alfonso ORN wrote: Please call pt back to advise [phone]

## 2024-12-18 NOTE — Telephone Encounter (Signed)
Megan Bradshaw notified as instructed by telephone.  Patient states understanding.

## 2024-12-20 ENCOUNTER — Ambulatory Visit: Payer: Self-pay | Admitting: Urgent Care

## 2024-12-20 ENCOUNTER — Ambulatory Visit
Admission: RE | Admit: 2024-12-20 | Discharge: 2024-12-20 | Disposition: A | Source: Home / Self Care | Attending: Podiatry | Admitting: Podiatry

## 2024-12-20 ENCOUNTER — Ambulatory Visit: Admitting: Certified Registered"

## 2024-12-20 ENCOUNTER — Encounter: Admission: RE | Disposition: A | Payer: Self-pay | Source: Home / Self Care | Attending: Podiatry

## 2024-12-20 ENCOUNTER — Encounter: Payer: Self-pay | Admitting: Podiatry

## 2024-12-20 ENCOUNTER — Other Ambulatory Visit: Payer: Self-pay

## 2024-12-20 DIAGNOSIS — Z01812 Encounter for preprocedural laboratory examination: Secondary | ICD-10-CM

## 2024-12-20 DIAGNOSIS — T380X5D Adverse effect of glucocorticoids and synthetic analogues, subsequent encounter: Secondary | ICD-10-CM

## 2024-12-20 LAB — GLUCOSE, CAPILLARY
Glucose-Capillary: 202 mg/dL — ABNORMAL HIGH (ref 70–99)
Glucose-Capillary: 208 mg/dL — ABNORMAL HIGH (ref 70–99)

## 2024-12-20 MED ORDER — ACETAMINOPHEN 10 MG/ML IV SOLN
INTRAVENOUS | Status: DC | PRN
  Administered 2024-12-20: 1000 mg via INTRAVENOUS

## 2024-12-20 MED ORDER — SODIUM CHLORIDE 0.9 % IV SOLN: INTRAVENOUS | Status: DC

## 2024-12-20 MED ORDER — PROPOFOL 500 MG/50ML IV EMUL
INTRAVENOUS | Status: DC | PRN
  Administered 2024-12-20: 100 ug/kg/min via INTRAVENOUS

## 2024-12-20 MED ORDER — LIDOCAINE HCL (CARDIAC) PF 100 MG/5ML IV SOSY
PREFILLED_SYRINGE | INTRAVENOUS | Status: DC | PRN
  Administered 2024-12-20: 50 mg via INTRAVENOUS

## 2024-12-20 MED ORDER — LIDOCAINE HCL (PF) 1 % IJ SOLN
INTRAMUSCULAR | Status: AC
  Filled 2024-12-20: qty 30

## 2024-12-20 MED ORDER — BUPIVACAINE HCL 0.5 % IJ SOLN
INTRAMUSCULAR | Status: DC | PRN
  Administered 2024-12-20: 5 mL

## 2024-12-20 MED ORDER — PROPOFOL 10 MG/ML IV BOLUS
INTRAVENOUS | Status: AC
  Filled 2024-12-20: qty 20

## 2024-12-20 MED ORDER — ORAL CARE MOUTH RINSE: 15.0000 mL | Freq: Once | OROMUCOSAL | Status: DC

## 2024-12-20 MED ORDER — DROPERIDOL 2.5 MG/ML IJ SOLN: 0.6250 mg | Freq: Once | INTRAMUSCULAR | Status: DC | PRN

## 2024-12-20 MED ORDER — CHLORHEXIDINE GLUCONATE 0.12 % MT SOLN
OROMUCOSAL | Status: AC
  Filled 2024-12-20: qty 15

## 2024-12-20 MED ORDER — OXYCODONE-ACETAMINOPHEN 5-325 MG PO TABS: 1.0000 | ORAL_TABLET | Freq: Four times a day (QID) | ORAL | 0 refills | Status: AC | PRN

## 2024-12-20 MED ORDER — 0.9 % SODIUM CHLORIDE (POUR BTL) OPTIME
TOPICAL | Status: DC | PRN
  Administered 2024-12-20: 500 mL

## 2024-12-20 MED ORDER — CEFAZOLIN SODIUM-DEXTROSE 2-3 GM-%(50ML) IV SOLR
INTRAVENOUS | Status: DC | PRN
  Administered 2024-12-20: 2 g via INTRAVENOUS

## 2024-12-20 MED ORDER — OXYCODONE HCL 5 MG PO TABS
5.0000 mg | ORAL_TABLET | Freq: Once | ORAL | Status: AC | PRN
  Administered 2024-12-20: 5 mg via ORAL

## 2024-12-20 MED ORDER — ACETAMINOPHEN 10 MG/ML IV SOLN: 1000.0000 mg | Freq: Once | INTRAVENOUS | Status: DC | PRN

## 2024-12-20 MED ORDER — BUPIVACAINE HCL (PF) 0.5 % IJ SOLN
INTRAMUSCULAR | Status: AC
  Filled 2024-12-20: qty 30

## 2024-12-20 MED ORDER — OXYCODONE HCL 5 MG/5ML PO SOLN: 5.0000 mg | Freq: Once | ORAL | Status: AC | PRN

## 2024-12-20 MED ORDER — IBUPROFEN 600 MG PO TABS: 600.0000 mg | ORAL_TABLET | Freq: Four times a day (QID) | ORAL | 0 refills | Status: AC | PRN

## 2024-12-20 MED ORDER — OXYCODONE HCL 5 MG PO TABS
ORAL_TABLET | ORAL | Status: AC
  Filled 2024-12-20: qty 1

## 2024-12-20 MED ORDER — FENTANYL CITRATE (PF) 100 MCG/2ML IJ SOLN: 25.0000 ug | INTRAMUSCULAR | Status: DC | PRN

## 2024-12-20 MED ORDER — LIDOCAINE HCL (PF) 1 % IJ SOLN
INTRAMUSCULAR | Status: DC | PRN
  Administered 2024-12-20: 5 mL

## 2024-12-20 MED ORDER — TRIPLE ANTIBIOTIC 3.5-400-5000 EX OINT
TOPICAL_OINTMENT | CUTANEOUS | Status: AC
  Filled 2024-12-20: qty 1

## 2024-12-20 MED ORDER — PROPOFOL 10 MG/ML IV BOLUS
INTRAVENOUS | Status: DC | PRN
  Administered 2024-12-20: 20 mg via INTRAVENOUS
  Administered 2024-12-20: 10 mg via INTRAVENOUS
  Administered 2024-12-20: 20 mg via INTRAVENOUS
  Administered 2024-12-20: 50 mg via INTRAVENOUS
  Administered 2024-12-20: 40 mg via INTRAVENOUS
  Administered 2024-12-20 (×2): 20 mg via INTRAVENOUS

## 2024-12-20 MED ORDER — CHLORHEXIDINE GLUCONATE 0.12 % MT SOLN: 15.0000 mL | Freq: Once | OROMUCOSAL | Status: DC

## 2024-12-20 MED ORDER — DEXMEDETOMIDINE HCL IN NACL 80 MCG/20ML IV SOLN
INTRAVENOUS | Status: DC | PRN
  Administered 2024-12-20: 8 ug via INTRAVENOUS

## 2024-12-20 MED ORDER — VANCOMYCIN HCL 1000 MG IV SOLR
INTRAVENOUS | Status: AC
  Filled 2024-12-20: qty 20

## 2024-12-20 MED ORDER — ACETAMINOPHEN 10 MG/ML IV SOLN
INTRAVENOUS | Status: AC
  Filled 2024-12-20: qty 100

## 2024-12-20 MED ORDER — PROPOFOL 1000 MG/100ML IV EMUL
INTRAVENOUS | Status: AC
  Filled 2024-12-20: qty 100

## 2024-12-20 NOTE — Interval H&P Note (Signed)
 History and Physical Interval Note:  12/20/2024 12:32 PM  Megan Bradshaw  has presented today for surgery, with the diagnosis of Acquired hammer toes of both feet M2041 M2042.  The various methods of treatment have been discussed with the patient and family. After consideration of risks, benefits and other options for treatment, the patient has consented to  Procedures with comments: AMPUTATION, TOE (Right) - IV SEDATION WITH LOCAL as a surgical intervention.  The patient's history has been reviewed, patient examined, no change in status, stable for surgery.  I have reviewed the patient's chart and labs.  Questions were answered to the patient's satisfaction.     Thresa CHRISTELLA Sar

## 2024-12-20 NOTE — Discharge Instructions (Signed)
 Leave dressings clean and dry. Ice around ankle. Elevate foot. Minimal weight bearing in a postop shoe.

## 2024-12-20 NOTE — Transfer of Care (Signed)
 Immediate Anesthesia Transfer of Care Note  Patient: Megan Bradshaw  Procedure(s) Performed: AMPUTATION, TOE (Right: Toe)  Patient Location: PACU  Anesthesia Type:General  Level of Consciousness: awake, alert , and oriented  Airway & Oxygen  Therapy: Patient Spontanous Breathing  Post-op Assessment: Report given to RN and Post -op Vital signs reviewed and stable  Post vital signs: Reviewed and stable  Last Vitals:  Vitals Value Taken Time  BP 94/54 12/20/24 13:56  Temp    Pulse 74 12/20/24 13:59  Resp 21 12/20/24 13:59  SpO2 95 % 12/20/24 13:59  Vitals shown include unfiled device data.  Last Pain:  Vitals:   12/20/24 1037  TempSrc: Temporal  PainSc: 3          Complications: No notable events documented.

## 2024-12-20 NOTE — Brief Op Note (Signed)
 OR Date: 12/20/2024 OR Patient Start Time: 12:15 PM  2:19 PM  PATIENT:  Megan Bradshaw  89 y.o. female  PRE-OPERATIVE DIAGNOSIS:  Acquired hammer toes of both feet M2041 [phone]  POST-OPERATIVE DIAGNOSIS:  Acquired hammer toes of both feet M2041 M2042  PROCEDURE:  Procedures with comments: AMPUTATION, TOE (Right) - IV SEDATION WITH LOCAL  SURGEON:  Surgeons and Role:    DEWAINE Janit Thresa CHRISTELLA, DPM - Primary  PHYSICIAN ASSISTANT:   ASSISTANTS: none   ANESTHESIA:   local and IV sedation  EBL:  3 mL   BLOOD ADMINISTERED:none  DRAINS: none   LOCAL MEDICATIONS USED:  MARCAINE    , LIDOCAINE  , and Amount: 10 ml  SPECIMEN:  No Specimen  DISPOSITION OF SPECIMEN:  PATHOLOGY  COUNTS:  YES  TOURNIQUET:   Total Tourniquet Time Documented: Calf (Right) - 17 minutes Total: Calf (Right) - 17 minutes   DICTATION: .[place] Dictation  PLAN OF CARE: Admit to inpatient   PATIENT DISPOSITION:  PACU - hemodynamically stable.   Delay start of Pharmacological VTE agent (>24hrs) due to surgical blood loss or risk of bleeding: not applicable  Thresa EMERSON Janit, DPM [facility]  Dr. Thresa EMERSON Janit, DPM    [address] [place], [place] [address]                Office [phone]  Fax [phone]

## 2024-12-20 NOTE — Anesthesia Procedure Notes (Signed)
 Procedure Name: MAC Date/Time: 12/20/2024 1:18 PM  Performed by: Germaine Maeola CROME, CRNAPre-anesthesia Checklist: Patient identified, Emergency Drugs available, Suction available, Patient being monitored and Timeout performed Patient Re-evaluated:Patient Re-evaluated prior to induction Oxygen  Delivery Method: Nasal cannula Induction Type: IV induction Placement Confirmation: positive ETCO2 and CO2 detector

## 2024-12-20 NOTE — Anesthesia Preprocedure Evaluation (Addendum)
"                                    Anesthesia Evaluation  Patient identified by MRN, date of birth, ID band Patient awake    Reviewed: Allergy & Precautions, H&P , NPO status , Patient's Chart, lab work & pertinent test results  Airway Mallampati: II  TM Distance: >3 FB Neck ROM: full    Dental  (+) Edentulous Upper, Missing   Pulmonary asthma , COPD, former smoker   Pulmonary exam normal        Cardiovascular Normal cardiovascular exam  8/23:  1. Left ventricular ejection fraction, by estimation, is 55%. The left  ventricle has normal function. Left ventricular endocardial border not  optimally defined to evaluate regional wall motion. Left ventricular  diastolic parameters are consistent with  Grade I diastolic dysfunction (impaired relaxation).   2. Right ventricular systolic function is normal. The right ventricular  size is not well visualized.   3. The mitral valve is normal in structure. No evidence of mitral valve  regurgitation.   4. The aortic valve was not well visualized. Aortic valve regurgitation  is not visualized.   5. The inferior vena cava is normal in size with greater than 50%  respiratory variability, suggesting right atrial pressure of 3 mmHg.     Neuro/Psych negative neurological ROS  negative psych ROS   GI/Hepatic negative GI ROS, Neg liver ROS,,,  Endo/Other  diabetes, Type 2Hypothyroidism    Renal/GU negative Renal ROS  negative genitourinary   Musculoskeletal   Abdominal  (+) + obese  Peds  Hematology negative hematology ROS (+)   Anesthesia Other Findings Past Medical History: No date: Acquired hammer toes of both feet No date: Actinic keratosis No date: Arthritis No date: Asthma No date: COPD (chronic obstructive pulmonary disease) (HCC) No date: Diabetes (HCC) 10/09/2023: SCC (squamous cell carcinoma)     Comment:  left lower lateral leg, MOHs 11/03/2023 No date: Thyroid  disease  Past Surgical  History: 1984: ABDOMINAL HYSTERECTOMY     Comment:  benign, TAH. 1939 : APPENDECTOMY No date: CATARACT EXTRACTION, BILATERAL 1987: CHOLECYSTECTOMY 1990: NASAL SINUS SURGERY 2007: REPLACEMENT TOTAL KNEE; Right     Reproductive/Obstetrics negative OB ROS                              Anesthesia Physical Anesthesia Plan  ASA: 3  Anesthesia Plan: General   Post-op Pain Management:    Induction: Intravenous  PONV Risk Score and Plan: Propofol  infusion and TIVA  Airway Management Planned: Natural Airway  Additional Equipment:   Intra-op Plan:   Post-operative Plan:   Informed Consent: I have reviewed the patients History and Physical, chart, labs and discussed the procedure including the risks, benefits and alternatives for the proposed anesthesia with the patient or authorized representative who has indicated his/her understanding and acceptance.     Dental Advisory Given  Plan Discussed with: CRNA and Surgeon  Anesthesia Plan Comments:          Anesthesia Quick Evaluation  "

## 2024-12-27 ENCOUNTER — Encounter: Admitting: Podiatry

## 2025-01-10 ENCOUNTER — Encounter: Admitting: Podiatry

## 2025-01-24 ENCOUNTER — Encounter: Admitting: Podiatry

## 2025-01-28 ENCOUNTER — Ambulatory Visit

## 2025-03-03 ENCOUNTER — Ambulatory Visit: Admitting: Dermatology
